# Patient Record
Sex: Female | Born: 1950
Health system: Southern US, Community
[De-identification: ages and names within clinical notes are randomized; demographics above are authoritative.]

## PROBLEM LIST (undated history)

## (undated) ENCOUNTER — Emergency Department (HOSPITAL_COMMUNITY): Admission: EM | Payer: Medicare Other | Source: Home / Self Care

## (undated) DIAGNOSIS — F419 Anxiety disorder, unspecified: Secondary | ICD-10-CM

## (undated) DIAGNOSIS — M1990 Unspecified osteoarthritis, unspecified site: Secondary | ICD-10-CM

## (undated) DIAGNOSIS — I69191 Dysphagia following nontraumatic intracerebral hemorrhage: Secondary | ICD-10-CM

## (undated) DIAGNOSIS — I639 Cerebral infarction, unspecified: Secondary | ICD-10-CM

## (undated) DIAGNOSIS — R4701 Aphasia: Secondary | ICD-10-CM

## (undated) DIAGNOSIS — E785 Hyperlipidemia, unspecified: Secondary | ICD-10-CM

## (undated) DIAGNOSIS — M6281 Muscle weakness (generalized): Secondary | ICD-10-CM

## (undated) HISTORY — DX: Unspecified osteoarthritis, unspecified site: M19.90

## (undated) HISTORY — PX: CHOLECYSTECTOMY: SHX55

## (undated) HISTORY — DX: Cerebral infarction, unspecified: I63.9

## (undated) HISTORY — DX: Hyperlipidemia, unspecified: E78.5

## (undated) HISTORY — PX: TONSILLECTOMY: SUR1361

---

## 2012-01-26 ENCOUNTER — Ambulatory Visit: Payer: Self-pay | Admitting: Family Medicine

## 2012-02-15 ENCOUNTER — Ambulatory Visit (INDEPENDENT_AMBULATORY_CARE_PROVIDER_SITE_OTHER): Payer: Medicare Other | Admitting: Family Medicine

## 2012-02-15 ENCOUNTER — Encounter: Payer: Self-pay | Admitting: Family Medicine

## 2012-02-15 VITALS — BP 130/70 | HR 92 | Temp 98.1°F | Ht 62.5 in | Wt 153.6 lb

## 2012-02-15 DIAGNOSIS — Z0000 Encounter for general adult medical examination without abnormal findings: Secondary | ICD-10-CM

## 2012-02-15 DIAGNOSIS — E785 Hyperlipidemia, unspecified: Secondary | ICD-10-CM | POA: Insufficient documentation

## 2012-02-15 DIAGNOSIS — E119 Type 2 diabetes mellitus without complications: Secondary | ICD-10-CM

## 2012-02-15 DIAGNOSIS — Z1231 Encounter for screening mammogram for malignant neoplasm of breast: Secondary | ICD-10-CM

## 2012-02-15 DIAGNOSIS — Z780 Asymptomatic menopausal state: Secondary | ICD-10-CM

## 2012-02-15 DIAGNOSIS — E1149 Type 2 diabetes mellitus with other diabetic neurological complication: Secondary | ICD-10-CM | POA: Insufficient documentation

## 2012-02-15 DIAGNOSIS — R319 Hematuria, unspecified: Secondary | ICD-10-CM

## 2012-02-15 LAB — POCT URINALYSIS DIPSTICK
Ketones, UA: NEGATIVE
Spec Grav, UA: >=1.03
pH, UA: 6

## 2012-02-15 MED ORDER — METFORMIN HCL 500 MG PO TABS: 1000.0000 mg | ORAL_TABLET | Freq: Every day | ORAL | Status: DC

## 2012-02-15 MED ORDER — GLIPIZIDE 10 MG PO TABS: 10.0000 mg | ORAL_TABLET | Freq: Two times a day (BID) | ORAL | Status: DC

## 2012-02-15 MED ORDER — ATORVASTATIN CALCIUM 20 MG PO TABS: 20.0000 mg | ORAL_TABLET | Freq: Every day | ORAL | Status: DC

## 2012-02-15 NOTE — Progress Notes (Signed)
Addended by: Silvio Pate D on: 02/15/2012 04:35 PM   Modules accepted: Orders

## 2012-02-15 NOTE — Patient Instructions (Signed)

## 2012-02-15 NOTE — Progress Notes (Signed)
  Subjective:     Sheila [name] is a 61 y.o. female who presents for follow up of diabetes.. Current symptoms include: none. Patient denies foot ulcerations, hyperglycemia, hypoglycemia , increased appetite, nausea, paresthesia of the feet, polydipsia, polyuria, visual disturbances, vomiting and weight loss. Evaluation to date has been: fasting blood sugar, fasting lipid panel, hemoglobin A1C and microalbuminuria. Home sugars: BGs consistently in an acceptable range. Current treatments: more intensive attention to diet which has been effective, Continued sulfonylurea which has been effective, Continued metformin which has been effective and Continued statin which has been effective. Last dilated eye exam > 1 year.  The following portions of the patient's history were reviewed and updated as appropriate: allergies, current medications, past family history, past medical history, past social history, past surgical history and problem list.  Review of Systems Review of Systems  Constitutional: Negative for activity change, appetite change and fatigue.  HENT: Negative for hearing loss, congestion, tinnitus and ear discharge.   Eyes: Negative for visual disturbance (see optho q1y -- vision corrected to 20/20 with glasses-- ov due).  Respiratory: Negative for cough, chest tightness and shortness of breath.   Cardiovascular: Negative for chest pain, palpitations and leg swelling.  Gastrointestinal: Negative for abdominal pain, diarrhea, constipation and abdominal distention.  Genitourinary: Negative for urgency, frequency, decreased urine volume and difficulty urinating.  Musculoskeletal: Negative for back pain, arthralgias ---  + left sided drop ffoot and weakness in arm secondary to stroke Skin: Negative for color change, pallor and rash.  Neurological: Negative for dizziness, light-headedness, numbness and headaches.  Hematological: Negative for adenopathy. Does not bruise/bleed easily.    Psychiatric/Behavioral: Negative for suicidal ideas, confusion, sleep disturbance, self-injury, dysphoric mood, decreased concentration and agitation.       Objective:    BP 130/70  Pulse 92  Temp(Src) 98.1 F (36.7 C) (Oral)  Ht 5' 2.5" (1.588 m)  Wt 153 lb 9.6 oz (69.673 kg)  BMI 27.65 kg/m2  SpO2 97% General appearance: alert, cooperative, appears stated age and no distress Lungs: clear to auscultation bilaterally Heart: regular rate and rhythm, S1, S2 normal, no murmur, click, rub or gallop Extremities: extremities normal, atraumatic, no cyanosis or edema   Sensory exam of the foot is normal, tested with the monofilament. Good pulses, no lesions or ulcers, good peripheral pulses.   Patient was evaluated for proper footwear and sizing.  Laboratory: No components found with this basename: A1C      Assessment:    Diabetes mellitus Type II, under - control.   hyperlipidemia--- con't lipitor Plan:    Discussed general issues about diabetes pathophysiology and management. Agricultural engineer distributed. Discussed foot care. Reminded to get yearly retinal exam. Continued sulfonylurea; see medication orders.  Continued metformin; see  medication orders. Continued statin drug see medication orders. Labs: fasting blood sugar, fasting lipid panel, hemoglobin A1C and microalbuminuria. Follow up in 3 months or as needed.

## 2012-02-16 LAB — CBC WITH DIFFERENTIAL/PLATELET
Basophils Absolute: 0 10*3/uL (ref 0.0–0.1)
Eosinophils Relative: 3.4 % (ref 0.0–5.0)
Hemoglobin: 13.4 g/dL (ref 12.0–15.0)
Lymphocytes Relative: 39.8 % (ref 12.0–46.0)
Monocytes Relative: 12.6 % — ABNORMAL HIGH (ref 3.0–12.0)
Neutro Abs: 2.8 10*3/uL (ref 1.4–7.7)
Platelets: 277 10*3/uL (ref 150.0–400.0)
RDW: 13.4 % (ref 11.5–14.6)
WBC: 6.4 10*3/uL (ref 4.5–10.5)

## 2012-02-16 LAB — LIPID PANEL
HDL: 62 mg/dL (ref >39.00)
LDL Cholesterol: 64 mg/dL (ref 0–99)
Total CHOL/HDL Ratio: 2
VLDL: 28.2 mg/dL (ref 0.0–40.0)

## 2012-02-16 LAB — HEPATIC FUNCTION PANEL
AST: 22 U/L (ref 0–37)
Alkaline Phosphatase: 78 U/L (ref 39–117)
Bilirubin, Direct: 0.1 mg/dL (ref 0.0–0.3)

## 2012-02-16 LAB — BASIC METABOLIC PANEL
BUN: 14 mg/dL (ref 6–23)
Calcium: 9.3 mg/dL (ref 8.4–10.5)
GFR: 65.78 mL/min (ref >60.00)
Glucose, Bld: 85 mg/dL (ref 70–99)
Potassium: 3.6 mEq/L (ref 3.5–5.1)
Sodium: 140 mEq/L (ref 135–145)

## 2012-02-16 LAB — HEMOGLOBIN A1C: Hgb A1c MFr Bld: 5.3 % (ref 4.6–6.5)

## 2012-02-16 LAB — MICROALBUMIN / CREATININE URINE RATIO: Microalb, Ur: 1.7 mg/dL (ref 0.0–1.9)

## 2012-02-18 LAB — URINE CULTURE: Colony Count: 30000

## 2012-02-21 ENCOUNTER — Telehealth: Payer: Self-pay

## 2012-02-21 NOTE — Telephone Encounter (Signed)
Patient aware to take Rx once a day if this is how it was previously prescribed and she voiced understanding and agreed. Changed on the med list     KP

## 2012-02-21 NOTE — Telephone Encounter (Signed)
Call from patient and she she stated she normally takes the Glipizide once a day and the Rx was sent for twice a day. Please advise     KP

## 2012-02-21 NOTE — Telephone Encounter (Signed)
If taking it once a day that is ok

## 2012-03-08 ENCOUNTER — Ambulatory Visit
Admission: RE | Admit: 2012-03-08 | Discharge: 2012-03-08 | Disposition: A | Discharge status: Home / Self Care | Payer: Medicare Other | Source: Ambulatory Visit | Attending: Family Medicine | Admitting: Family Medicine

## 2012-03-08 DIAGNOSIS — Z1231 Encounter for screening mammogram for malignant neoplasm of breast: Secondary | ICD-10-CM

## 2012-03-08 DIAGNOSIS — Z780 Asymptomatic menopausal state: Secondary | ICD-10-CM

## 2012-03-09 ENCOUNTER — Encounter: Payer: Self-pay | Admitting: Family Medicine

## 2013-02-09 ENCOUNTER — Other Ambulatory Visit: Payer: Self-pay | Admitting: Family Medicine

## 2013-02-28 ENCOUNTER — Telehealth: Payer: Self-pay | Admitting: Family Medicine

## 2013-02-28 NOTE — Telephone Encounter (Signed)
To MD for review     KP 

## 2013-02-28 NOTE — Telephone Encounter (Signed)
Patient Information:  Caller Name: Seanne  Phone: [phone]  Patient: Sheila [name]  Gender: Female  DOB: 1951-07-25  Age: 62 Years  PCP: Lelon Perla.  Office Follow Up:  Does the office need to follow up with this patient?: No  Instructions For The Office: N/A  RN Note:  Pt fell down 2 to 3 sets of stairs, Pt landed on Carpet on Right leg. Pt unable to put weight on Right Leg. Right Leg is shorter than Left Leg w/ current hip pain.  Advised Pt to be seen at ED or Urgent care for xrays due to severe pain, walks w/ limp and Right Leg is shorter than Left Leg.  Symptoms  Reason For Call & Symptoms: ER CALL.  Fall, Right Leg Pain, unable to put weight on it  Reviewed Health History In EMR: N/A  Reviewed Medications In EMR: N/A  Reviewed Allergies In EMR: N/A  Reviewed Surgeries / Procedures: N/A  Date of Onset of Symptoms: 02/27/2013  Guideline(s) Used:  Leg Injury  Hip Injury  Disposition Per Guideline:   Go to ED Now (or to Office with PCP Approval)  Reason For Disposition Reached:   Severe pain (e.g., excruciating)  Advice Given:  N/A

## 2013-02-28 NOTE — Telephone Encounter (Signed)
Apt scheduled in the office for tomorrow per patient request.    KP

## 2013-02-28 NOTE — Telephone Encounter (Signed)
Sounds like she needs xrays--- to ER

## 2013-03-01 ENCOUNTER — Encounter: Payer: Self-pay | Admitting: Family Medicine

## 2013-03-01 ENCOUNTER — Ambulatory Visit (INDEPENDENT_AMBULATORY_CARE_PROVIDER_SITE_OTHER): Payer: Medicare Other | Admitting: Family Medicine

## 2013-03-01 ENCOUNTER — Ambulatory Visit (INDEPENDENT_AMBULATORY_CARE_PROVIDER_SITE_OTHER)
Admission: RE | Admit: 2013-03-01 | Discharge: 2013-03-01 | Disposition: A | Discharge status: Home / Self Care | Payer: Medicare Other | Source: Ambulatory Visit | Attending: Family Medicine | Admitting: Family Medicine

## 2013-03-01 VITALS — BP 152/86 | HR 98 | Temp 98.5°F | Wt 155.6 lb

## 2013-03-01 DIAGNOSIS — E785 Hyperlipidemia, unspecified: Secondary | ICD-10-CM

## 2013-03-01 DIAGNOSIS — M549 Dorsalgia, unspecified: Secondary | ICD-10-CM

## 2013-03-01 DIAGNOSIS — E119 Type 2 diabetes mellitus without complications: Secondary | ICD-10-CM

## 2013-03-01 LAB — LIPID PANEL
HDL: 49.6 mg/dL (ref >39.00)
Total CHOL/HDL Ratio: 3
VLDL: 18.6 mg/dL (ref 0.0–40.0)

## 2013-03-01 LAB — POCT URINALYSIS DIPSTICK
Ketones, UA: NEGATIVE
Leukocytes, UA: NEGATIVE
Nitrite, UA: NEGATIVE
Protein, UA: NEGATIVE
pH, UA: 6

## 2013-03-01 LAB — BASIC METABOLIC PANEL
BUN: 16 mg/dL (ref 6–23)
Chloride: 106 mEq/L (ref 96–112)
GFR: 65.56 mL/min (ref >60.00)
Glucose, Bld: 157 mg/dL — ABNORMAL HIGH (ref 70–99)
Potassium: 3.7 mEq/L (ref 3.5–5.1)
Sodium: 139 mEq/L (ref 135–145)

## 2013-03-01 LAB — MICROALBUMIN / CREATININE URINE RATIO: Microalb, Ur: 1.9 mg/dL (ref 0.0–1.9)

## 2013-03-01 LAB — HEPATIC FUNCTION PANEL
AST: 21 U/L (ref 0–37)
Albumin: 4 g/dL (ref 3.5–5.2)
Alkaline Phosphatase: 89 U/L (ref 39–117)
Bilirubin, Direct: 0 mg/dL (ref 0.0–0.3)
Total Protein: 7.1 g/dL (ref 6.0–8.3)

## 2013-03-01 LAB — HEMOGLOBIN A1C: Hgb A1c MFr Bld: 5.3 % (ref 4.6–6.5)

## 2013-03-01 MED ORDER — CYCLOBENZAPRINE HCL 10 MG PO TABS: 10.0000 mg | ORAL_TABLET | Freq: Three times a day (TID) | ORAL | Status: DC | PRN

## 2013-03-01 MED ORDER — TRAMADOL HCL 50 MG PO TABS: 50.0000 mg | ORAL_TABLET | Freq: Three times a day (TID) | ORAL | Status: DC | PRN

## 2013-03-01 NOTE — Patient Instructions (Addendum)
Back Pain, Adult Low back pain is very common. About 1 in 5 people have back pain.The cause of low back pain is rarely dangerous. The pain often gets better over time.About half of people with a sudden onset of back pain feel better in just 2 weeks. About 8 in 10 people feel better by 6 weeks.  CAUSES Some common causes of back pain include:  Strain of the muscles or ligaments supporting the spine.  Wear and tear (degeneration) of the spinal discs.  Arthritis.  Direct injury to the back. DIAGNOSIS Most of the time, the direct cause of low back pain is not known.However, back pain can be treated effectively even when the exact cause of the pain is unknown.Answering your caregiver's questions about your overall health and symptoms is one of the most accurate ways to make sure the cause of your pain is not dangerous. If your caregiver needs more information, he or she may order lab work or imaging tests (X-rays or MRIs).However, even if imaging tests show changes in your back, this usually does not require surgery. HOME CARE INSTRUCTIONS For many people, back pain returns.Since low back pain is rarely dangerous, it is often a condition that people can learn to manageon their own.   Remain active. It is stressful on the back to sit or stand in one place. Do not sit, drive, or stand in one place for more than 30 minutes at a time. Take short walks on level surfaces as soon as pain allows.Try to increase the length of time you walk each day.  Do not stay in bed.Resting more than 1 or 2 days can delay your recovery.  Do not avoid exercise or work.Your body is made to move.It is not dangerous to be active, even though your back may hurt.Your back will likely heal faster if you return to being active before your pain is gone.  Pay attention to your body when you bend and lift. Many people have less discomfortwhen lifting if they bend their knees, keep the load close to their bodies,and  avoid twisting. Often, the most comfortable positions are those that put less stress on your recovering back.  Find a comfortable position to sleep. Use a firm mattress and lie on your side with your knees slightly bent. If you lie on your back, put a pillow under your knees.  Only take over-the-counter or prescription medicines as directed by your caregiver. Over-the-counter medicines to reduce pain and inflammation are often the most helpful.Your caregiver may prescribe muscle relaxant drugs.These medicines help dull your pain so you can more quickly return to your normal activities and healthy exercise.  Put ice on the injured area.  Put ice in a plastic bag.  Place a towel between your skin and the bag.  Leave the ice on for 15 to 20 minutes, 3 to 4 times a day for the first 2 to 3 days. After that, ice and heat may be alternated to reduce pain and spasms.  Ask your caregiver about trying back exercises and gentle massage. This may be of some benefit.  Avoid feeling anxious or stressed.Stress increases muscle tension and can worsen back pain.It is important to recognize when you are anxious or stressed and learn ways to manage it.Exercise is a great option. SEEK MEDICAL CARE IF:  You have pain that is not relieved with rest or medicine.  You have pain that does not improve in 1 week.  You have new symptoms.  You are generally   not feeling well. SEEK IMMEDIATE MEDICAL CARE IF:   You have pain that radiates from your back into your legs.  You develop new bowel or bladder control problems.  You have unusual weakness or numbness in your arms or legs.  You develop nausea or vomiting.  You develop abdominal pain.  You feel faint. Document Released: 12/13/2005 Document Revised: 06/13/2012 Document Reviewed: 05/03/2011 Exit[employer] Patient Information 2013 [employer].  

## 2013-03-01 NOTE — Assessment & Plan Note (Signed)
Ultram Flexeril Xray MRI See AVS

## 2013-03-01 NOTE — Progress Notes (Signed)
  Subjective:    Patient ID: Sheila [name], female    DOB: September 13, 1951, 62 y.o.   MRN: [id number]  HPI Pt here c/o fall 2 days ago.  She was coming down the steps at home and her right leg gave out on her and she slid down the steps with her leg bent underneath her.  Now pain in low back and R hip   Review of Systems As above Objective:   Physical Exam  BP 152/86  Pulse 98  Temp(Src) 98.5 F (36.9 C) (Oral)  Wt 155 lb 9.6 oz (70.58 kg)  BMI 27.99 kg/m2  SpO2 97% General appearance: alert, cooperative, appears stated age and mild distress Extremities: no edema  no errythema Neurologic: Alert and oriented X 3, normal strength and tone. Normal symmetric reflexes. Normal coordination and gait Motor: strength 3/5 R hip flexion and dorsiflexion R foot Reflexes: dec reflexes R patella Gait: Antalgic      Assessment & Plan:

## 2013-03-06 ENCOUNTER — Telehealth: Payer: Self-pay

## 2013-03-06 NOTE — Telephone Encounter (Signed)
Spoke with patient and she voiced understanding. Ortho referral put in. She would like for you to cancel the MRI because she does not have 400 dollars to pay upfront.      KP

## 2013-03-06 NOTE — Telephone Encounter (Signed)
Message copied by Arnette Norris on Tue Mar 06, 2013  5:38 PM ------      Message from: Lelon Perla      Created: Thu Mar 01, 2013  2:12 PM       Mild slip L4-5--refer to ortho ------

## 2013-03-07 NOTE — Telephone Encounter (Signed)
MRI has been cancelled for patient, and she has been referred to an Orthopaedic Specialist.

## 2013-03-07 NOTE — Telephone Encounter (Addendum)
Good morning Sheila [name],   Since MRI has been cancelled for this patient, radiology is asking that we remove the order now. Will you please remove? Thank you   BH     MRI cancelled.    KP//CMA

## 2013-03-08 ENCOUNTER — Ambulatory Visit (HOSPITAL_COMMUNITY): Admission: RE | Admit: 2013-03-08 | Payer: Medicare Other | Source: Ambulatory Visit

## 2013-04-26 ENCOUNTER — Other Ambulatory Visit: Payer: Self-pay | Admitting: Family Medicine

## 2013-04-26 NOTE — Telephone Encounter (Signed)
Last seen and filled 03/01/13 #30. Please advise     KP 

## 2013-04-28 ENCOUNTER — Other Ambulatory Visit: Payer: Self-pay | Admitting: Family Medicine

## 2013-04-30 NOTE — Telephone Encounter (Signed)
Last seen and filled 03/01/13 #30. Please advise     KP

## 2013-05-12 ENCOUNTER — Other Ambulatory Visit: Payer: Self-pay | Admitting: Family Medicine

## 2013-06-07 ENCOUNTER — Encounter: Payer: Self-pay | Admitting: Family Medicine

## 2013-06-07 ENCOUNTER — Ambulatory Visit (INDEPENDENT_AMBULATORY_CARE_PROVIDER_SITE_OTHER): Payer: Medicare Other | Admitting: Family Medicine

## 2013-06-07 ENCOUNTER — Encounter: Payer: Self-pay | Admitting: Lab

## 2013-06-07 VITALS — BP 122/70 | HR 73 | Temp 98.7°F | Wt 150.2 lb

## 2013-06-07 DIAGNOSIS — M549 Dorsalgia, unspecified: Secondary | ICD-10-CM

## 2013-06-07 NOTE — Progress Notes (Signed)
  Subjective:    Patient ID: Sheila [name], female    DOB: 02-12-1951, 61 y.o.   MRN: [id number]  HPI Pt here c/o numbness down left leg.  Pt fell several months ago--see ov and pt saw ortho.  She went to PT a few times but due to cost the pt sent her home with home exercises and she stopped PT.  Pt has had no new injury but over the last 3 weeks she has had worsening numbness in leg.  Pt has cyclobenzaprine , and mobic which does help.      Review of Systems As above     Objective:   Physical Exam BP 122/70  Pulse 73  Temp(Src) 98.7 F (37.1 C) (Oral)  Wt 150 lb 3.2 oz (68.13 kg)  BMI 27.02 kg/m2  SpO2 97% General appearance: alert, cooperative, appears stated age and no distress Extremities: extremities normal, atraumatic, no cyanosis or edema                  Good strength low ext , dtr = and b/l        Assessment & Plan:

## 2013-06-07 NOTE — Patient Instructions (Signed)
Back Exercises Back exercises help treat and prevent back injuries. The goal of back exercises is to increase the strength of your abdominal and back muscles and the flexibility of your back. These exercises should be started when you no longer have back pain. Back exercises include:  Pelvic Tilt. Lie on your back with your knees bent. Tilt your pelvis until the lower part of your back is against the floor. Hold this position 5 to 10 sec and repeat 5 to 10 times.  Knee to Chest. Pull first 1 knee up against your chest and hold for 20 to 30 seconds, repeat this with the other knee, and then both knees. This may be done with the other leg straight or bent, whichever feels better.  Sit-Ups or Curl-Ups. Bend your knees 90 degrees. Start with tilting your pelvis, and do a partial, slow sit-up, lifting your trunk only 30 to 45 degrees off the floor. Take at least 2 to 3 seconds for each sit-up. Do not do sit-ups with your knees out straight. If partial sit-ups are difficult, simply do the above but with only tightening your abdominal muscles and holding it as directed.  Hip-Lift. Lie on your back with your knees flexed 90 degrees. Push down with your feet and shoulders as you raise your hips a couple inches off the floor; hold for 10 seconds, repeat 5 to 10 times.  Back arches. Lie on your stomach, propping yourself up on bent elbows. Slowly press on your hands, causing an arch in your low back. Repeat 3 to 5 times. Any initial stiffness and discomfort should lessen with repetition over time.  Shoulder-Lifts. Lie face down with arms beside your body. Keep hips and torso pressed to floor as you slowly lift your head and shoulders off the floor. Do not overdo your exercises, especially in the beginning. Exercises may cause you some mild back discomfort which lasts for a few minutes; however, if the pain is more severe, or lasts for more than 15 minutes, do not continue exercises until you see your caregiver.  Improvement with exercise therapy for back problems is slow.  See your caregivers for assistance with developing a proper back exercise program. Document Released: 01/20/2005 Document Revised: 03/06/2012 Document Reviewed: 10/14/2011 [employer] Patient Information 2014 [employer].  

## 2013-06-07 NOTE — Assessment & Plan Note (Signed)
Encouraged pt to do her PT exercises Use the mobic and flexeril Heat prn F/u ortho if no better Ortho note from March reviewed

## 2013-11-02 ENCOUNTER — Other Ambulatory Visit: Payer: Self-pay | Admitting: Family Medicine

## 2013-11-06 ENCOUNTER — Other Ambulatory Visit: Payer: Self-pay | Admitting: Family Medicine

## 2013-11-06 NOTE — Telephone Encounter (Signed)
Metformin and Glipizide refilled

## 2013-12-27 DIAGNOSIS — I639 Cerebral infarction, unspecified: Secondary | ICD-10-CM

## 2013-12-27 HISTORY — DX: Cerebral infarction, unspecified: I63.9

## 2014-01-17 ENCOUNTER — Telehealth: Payer: Self-pay | Admitting: Family Medicine

## 2014-01-17 NOTE — Telephone Encounter (Signed)
Pharmacy called requesting a verbal for pts diabetic supplies. CB# [phone]

## 2014-01-17 NOTE — Telephone Encounter (Signed)
Spoke with patient and she stated she has a [facility]. She is already gets her supplies from [facility]. No need to get them from anywhere else because she also has more than enough.    KP

## 2014-01-22 ENCOUNTER — Encounter: Payer: Self-pay | Admitting: Lab

## 2014-01-22 ENCOUNTER — Ambulatory Visit (INDEPENDENT_AMBULATORY_CARE_PROVIDER_SITE_OTHER): Payer: Medicare Other | Admitting: Family Medicine

## 2014-01-22 ENCOUNTER — Encounter: Payer: Self-pay | Admitting: Family Medicine

## 2014-01-22 VITALS — BP 130/76 | HR 84 | Temp 98.1°F | Wt 148.2 lb

## 2014-01-22 DIAGNOSIS — E785 Hyperlipidemia, unspecified: Secondary | ICD-10-CM

## 2014-01-22 DIAGNOSIS — F411 Generalized anxiety disorder: Secondary | ICD-10-CM

## 2014-01-22 DIAGNOSIS — E119 Type 2 diabetes mellitus without complications: Secondary | ICD-10-CM

## 2014-01-22 DIAGNOSIS — I10 Essential (primary) hypertension: Secondary | ICD-10-CM

## 2014-01-22 LAB — POCT URINALYSIS DIPSTICK
Bilirubin, UA: NEGATIVE
Blood, UA: NEGATIVE
GLUCOSE UA: NEGATIVE
KETONES UA: NEGATIVE
Leukocytes, UA: NEGATIVE
Nitrite, UA: NEGATIVE
Protein, UA: NEGATIVE
SPEC GRAV UA: 1.025
Urobilinogen, UA: 0.2
pH, UA: 6.5

## 2014-01-22 LAB — LIPID PANEL
CHOLESTEROL: 145 mg/dL (ref 0–200)
HDL: 56.2 mg/dL (ref >39.00)
LDL Cholesterol: 71 mg/dL (ref 0–99)
Total CHOL/HDL Ratio: 3
Triglycerides: 91 mg/dL (ref 0.0–149.0)
VLDL: 18.2 mg/dL (ref 0.0–40.0)

## 2014-01-22 LAB — MICROALBUMIN / CREATININE URINE RATIO
Creatinine,U: 208.6 mg/dL
Microalb Creat Ratio: 0.4 mg/g (ref 0.0–30.0)
Microalb, Ur: 0.9 mg/dL (ref 0.0–1.9)

## 2014-01-22 LAB — BASIC METABOLIC PANEL
BUN: 11 mg/dL (ref 6–23)
CO2: 25 mEq/L (ref 19–32)
CREATININE: 0.9 mg/dL (ref 0.4–1.2)
Calcium: 9.2 mg/dL (ref 8.4–10.5)
Chloride: 108 mEq/L (ref 96–112)
GFR: 64.56 mL/min (ref >60.00)
Glucose, Bld: 111 mg/dL — ABNORMAL HIGH (ref 70–99)
POTASSIUM: 3.8 meq/L (ref 3.5–5.1)
Sodium: 140 mEq/L (ref 135–145)

## 2014-01-22 LAB — HEPATIC FUNCTION PANEL
ALBUMIN: 4 g/dL (ref 3.5–5.2)
ALK PHOS: 90 U/L (ref 39–117)
ALT: 16 U/L (ref 0–35)
AST: 20 U/L (ref 0–37)
Bilirubin, Direct: 0 mg/dL (ref 0.0–0.3)
Total Bilirubin: 0.8 mg/dL (ref 0.3–1.2)
Total Protein: 7 g/dL (ref 6.0–8.3)

## 2014-01-22 LAB — HEMOGLOBIN A1C: HEMOGLOBIN A1C: 5.3 % (ref 4.6–6.5)

## 2014-01-22 MED ORDER — SERTRALINE HCL 50 MG PO TABS: 50.0000 mg | ORAL_TABLET | Freq: Every day | ORAL | Status: DC

## 2014-01-22 MED ORDER — CYCLOBENZAPRINE HCL 10 MG PO TABS: ORAL_TABLET | ORAL | Status: DC

## 2014-01-22 MED ORDER — LORAZEPAM 0.5 MG PO TABS: 0.5000 mg | ORAL_TABLET | Freq: Two times a day (BID) | ORAL | Status: DC | PRN

## 2014-01-22 NOTE — Progress Notes (Signed)
Pre visit review using our clinic review tool, if applicable. No additional management support is needed unless otherwise documented below in the visit note. 

## 2014-01-22 NOTE — Progress Notes (Signed)
Subjective:     Sheila [name] is a 63 y.o. female who presents for new evaluation and treatment of anxiety disorder and panic attacks. She has the following anxiety symptoms: difficulty concentrating, feelings of losing control and panic attacks. Onset of symptoms was approximately several months ago. Symptoms have been gradually worsening since that time. She denies current suicidal and homicidal ideation. Family history significant for no psychiatric illness. Risk factors: recently moved out on her own. Previous treatment includes Paxil. She complains of the following medication side effects: nightmares.    DIABETES  Blood Sugar ranges-56-200  Polyuria- no New Visual problems- no Hypoglycemic symptoms- no Other side effects-no Medication compliance - good Last eye exam- due Foot exam- today  HYPERLIPIDEMIA  Medication compliance- good RUQ pain- no  Muscle aches- no Other side effects-no  ROS See HPI above   PMH Smoking Status noted      The following portions of the patient's history were reviewed and updated as appropriate:  She  has a past medical history of Arthritis; Diabetes mellitus; Stroke; Seizures; and Hyperlipidemia. She  does not have any pertinent problems on file. She  has past surgical history that includes Cholecystectomy and Tonsillectomy. Her family history includes Diabetes in her brother and mother. She  reports that she quit smoking about 21 years ago. Her smoking use included Cigarettes. She smoked 0.00 packs per day. She has never used smokeless tobacco. She reports that she does not drink alcohol or use illicit drugs. She has a current medication list which includes the following prescription(s): aspirin, atorvastatin, vitamin d3, cyclobenzaprine, fish oil-omega-3 fatty acids, glipizide, metformin, tramadol, ibuprofen, magnesium, meloxicam, and multivitamin. Current Outpatient Prescriptions on File Prior to Visit  Medication Sig Dispense Refill  .  aspirin 81 MG tablet Take 81 mg by mouth daily.      Marland Kitchen atorvastatin (LIPITOR) 20 MG tablet 1 tab by mouth daily--Labs are due now  90 tablet  0  . Cholecalciferol (VITAMIN D3) 1000 UNITS CAPS Take 1 capsule by mouth daily.      . cyclobenzaprine (FLEXERIL) 10 MG tablet TAKE 1 TABLET BY MOUTH THREE TIMES DAILY AS NEEDED FOR MUSCLE SPASMS  30 tablet  0  . fish oil-omega-3 fatty acids 1000 MG capsule Take 1 g by mouth daily.      Marland Kitchen glipiZIDE (GLUCOTROL) 10 MG tablet TAKE 1 TABLET BY MOUTH TWICE DAILY BEFORE A MEAL  180 tablet  0  . metFORMIN (GLUCOPHAGE) 500 MG tablet TAKE 2 TABLETS ONCE A DAY  180 tablet  0  . traMADol (ULTRAM) 50 MG tablet TAKE 1 TABLET BY MOUTH EVERY 8 HOURS AS NEEDED FOR PAIN  30 tablet  0  . ibuprofen (ADVIL,MOTRIN) 800 MG tablet Take 800 mg by mouth every 8 (eight) hours as needed.      . magnesium 30 MG tablet Take 30 mg by mouth daily.      . meloxicam (MOBIC) 15 MG tablet Take 15 mg by mouth daily. Dr.Dumonski      . Multiple Vitamin (MULTIVITAMIN) tablet Take 1 tablet by mouth daily.       No current facility-administered medications on file prior to visit.   She is allergic to sulfa antibiotics..  Review of Systems Pertinent items are noted in HPI.    Objective:    BP 130/76  Pulse 84  Temp(Src) 98.1 F (36.7 C) (Oral)  Wt 148 lb 3.2 oz (67.223 kg)  SpO2 98% General appearance: alert, cooperative, appears stated age and no  distress Throat: lips, mucosa, and tongue normal; teeth and gums normal Neck: no adenopathy, no carotid bruit, no JVD, supple, symmetrical, trachea midline and thyroid not enlarged, symmetric, no tenderness/mass/nodules Lungs: clear to auscultation bilaterally Heart: S1, S2 normal Extremities: extremities normal, atraumatic, no cyanosis or edema Psych-- fast speech,  + anxiety with panic attacks      Assessment:    anxiety disorder and panic attacks. Possible organic contributing causes are: none.   Plan:    Medications: Ativan and  Zoloft. Labs: see orders. Handouts describing disease, natural history, and treatment were given to the patient. Instructed patient to contact office or on-call physician promptly should condition worsen or any new symptoms appear and provided on-call telephone numbers. IF THE PATIENT HAS ANY SUICIDAL OR HOMICIDAL IDEATIONS, CALL THE OFFICE, DISCUSS WITH A SUPPORT MEMBER, OR GO TO THE ER IMMEDIATELY. Patient was agreeable with this plan. Follow up: 3 months. Spent 25 minutes (>50% of visit) discussing the risks of anxiety disorder and panic attacks, the  pathophysiology, etiology, risks, and principles of treatment.

## 2014-01-22 NOTE — Patient Instructions (Signed)
Generalized Anxiety Disorder Generalized anxiety disorder (GAD) is a mental disorder. It interferes with life functions, including relationships, work, and school. GAD is different from normal anxiety, which everyone experiences at some point in their lives in response to specific life events and activities. Normal anxiety actually helps [place] prepare for and get through these life events and activities. Normal anxiety goes away after the event or activity is over.  GAD causes anxiety that is not necessarily related to specific events or activities. It also causes excess anxiety in proportion to specific events or activities. The anxiety associated with GAD is also difficult to control. GAD can vary from mild to severe. People with severe GAD can have intense waves of anxiety with physical symptoms (panic attacks).  SYMPTOMS The anxiety and worry associated with GAD are difficult to control. This anxiety and worry are related to many life events and activities and also occur more days than not for 6 months or longer. People with GAD also have three or more of the following symptoms (one or more in children):  Restlessness.   Fatigue.  Difficulty concentrating.   Irritability.  Muscle tension.  Difficulty sleeping or unsatisfying sleep. DIAGNOSIS GAD is diagnosed through an assessment by your caregiver. Your caregiver will ask you questions aboutyour mood,physical symptoms, and events in your life. Your caregiver may ask you about your medical history and use of alcohol or drugs, including prescription medications. Your caregiver may also do a physical exam and blood tests. Certain medical conditions and the use of certain substances can cause symptoms similar to those associated with GAD. Your caregiver may refer you to a mental health specialist for further evaluation. TREATMENT The following therapies are usually used to treat GAD:   Medication Antidepressant medication usually is  prescribed for long-term daily control. Antianxiety medications may be added in severe cases, especially when panic attacks occur.   Talk therapy (psychotherapy) Certain types of talk therapy can be helpful in treating GAD by providing support, education, and guidance. A form of talk therapy called cognitive behavioral therapy can teach you healthy ways to think about and react to daily life events and activities.  Stress managementtechniques These include yoga, meditation, and exercise and can be very helpful when they are practiced regularly. A mental health specialist can help determine which treatment is best for you. Some people see improvement with one therapy. However, other people require a combination of therapies. Document Released: 04/09/2013 Document Reviewed: 04/09/2013 [facility] Patient Information 2014 [place], [place].  Diabetes and Standards of Medical Care  Diabetes is complicated. You may find that your diabetes team includes a dietitian, nurse, diabetes educator, eye doctor, and more. To help everyone know what is going on and to help you get the care you deserve, the following schedule of care was developed to help keep you on track. Below are the tests, exams, vaccines, medicines, education, and plans you will need. HbA1c test This test shows how well you have controlled your glucose over the past 2 3 months. It is used to see if your diabetes management plan needs to be adjusted.   It is performed at least 2 times a year if you are meeting treatment goals.  It is performed 4 times a year if therapy has changed or if you are not meeting treatment goals. Blood pressure test  This test is performed at every routine medical visit. The goal is less than 140/90 mmHg for most people, but 130/80 mmHg in some cases. Ask your health  care provider about your goal. Dental exam  Follow up with the dentist regularly. Eye exam  If you are diagnosed with type 1 diabetes as a child,  get an exam upon reaching the age of 14 years or older and have had diabetes for 3 5 years. Yearly eye exams are recommended after that initial eye exam.  If you are diagnosed with type 1 diabetes as an adult, get an exam within 5 years of diagnosis and then yearly.  If you are diagnosed with type 2 diabetes, get an exam as soon as possible after the diagnosis and then yearly. Foot care exam  Visual foot exams are performed at every routine medical visit. The exams check for cuts, injuries, or other problems with the feet.  A comprehensive foot exam should be done yearly. This includes visual inspection as well as assessing foot pulses and testing for loss of sensation.  Check your feet nightly for cuts, injuries, or other problems with your feet. Tell your health care provider if anything is not healing. Kidney function test (urine microalbumin)  This test is performed once a year.  Type 1 diabetes: The first test is performed 5 years after diagnosis.  Type 2 diabetes: The first test is performed at the time of diagnosis.  A serum creatinine and estimated glomerular filtration rate (eGFR) test is done once a year to assess the level of chronic kidney disease (CKD), if present. Lipid profile (cholesterol, HDL, LDL, triglycerides)  Performed every 5 years for most people.  The goal for LDL is less than 100 mg/dL. If you are at high risk, the goal is less than 70 mg/dL.  The goal for HDL is 40 mg/dL 50 mg/dL for men and 50 mg/dL 60 mg/dL for women. An HDL cholesterol of 60 mg/dL or higher gives some protection against heart disease.  The goal for triglycerides is less than 150 mg/dL. Influenza vaccine, pneumococcal vaccine, and hepatitis B vaccine  The influenza vaccine is recommended yearly.  The pneumococcal vaccine is generally given once in a lifetime. However, there are some instances when another vaccination is recommended. Check with your health care provider.  The hepatitis  B vaccine is also recommended for adults with diabetes. Diabetes self-management education  Education is recommended at diagnosis and ongoing as needed. Treatment plan  Your treatment plan is reviewed at every medical visit. Document Released: 10/10/2009 Document Revised: 08/15/2013 Document Reviewed: 05/15/2013 [facility] Patient Information 2014 [place].

## 2014-01-23 ENCOUNTER — Telehealth: Payer: Self-pay | Admitting: Family Medicine

## 2014-01-23 NOTE — Telephone Encounter (Signed)
Relevant patient education assigned to patient using Emmi. ° °

## 2014-01-25 ENCOUNTER — Telehealth: Payer: Self-pay

## 2014-01-25 NOTE — Telephone Encounter (Signed)
Relevant patient education assigned to patient using Emmi. ° °

## 2014-01-26 NOTE — Assessment & Plan Note (Signed)
Check labs con't meds 

## 2014-01-29 ENCOUNTER — Telehealth: Payer: Self-pay | Admitting: *Deleted

## 2014-01-29 NOTE — Telephone Encounter (Signed)
UDS collected on 01/23/14, screen was negative for all prescribed medications. Patient tested positive for THC (>100) and Tramadol (171). Per Dr. Nonda Lou written note, " If UDS continues to be positive we will no longer be able to give her prescriptions." Results labeled and sent to be scanned.

## 2014-02-02 ENCOUNTER — Other Ambulatory Visit: Payer: Self-pay | Admitting: Family Medicine

## 2014-02-05 ENCOUNTER — Ambulatory Visit (INDEPENDENT_AMBULATORY_CARE_PROVIDER_SITE_OTHER): Payer: Medicare Other | Admitting: Licensed Clinical Social Worker

## 2014-02-05 DIAGNOSIS — F321 Major depressive disorder, single episode, moderate: Secondary | ICD-10-CM

## 2014-02-19 ENCOUNTER — Ambulatory Visit: Payer: 59 | Admitting: Licensed Clinical Social Worker

## 2014-02-21 ENCOUNTER — Ambulatory Visit: Payer: 59 | Admitting: Licensed Clinical Social Worker

## 2014-02-22 ENCOUNTER — Other Ambulatory Visit: Payer: Self-pay | Admitting: Family Medicine

## 2014-03-01 ENCOUNTER — Encounter: Payer: Self-pay | Admitting: Family Medicine

## 2014-03-12 ENCOUNTER — Ambulatory Visit (INDEPENDENT_AMBULATORY_CARE_PROVIDER_SITE_OTHER): Payer: 59 | Admitting: Licensed Clinical Social Worker

## 2014-03-12 ENCOUNTER — Encounter: Payer: Self-pay | Admitting: Family Medicine

## 2014-03-12 ENCOUNTER — Ambulatory Visit (INDEPENDENT_AMBULATORY_CARE_PROVIDER_SITE_OTHER): Payer: Medicare Other | Admitting: Family Medicine

## 2014-03-12 VITALS — BP 136/82 | HR 84 | Temp 98.3°F | Wt 145.0 lb

## 2014-03-12 DIAGNOSIS — F321 Major depressive disorder, single episode, moderate: Secondary | ICD-10-CM

## 2014-03-12 DIAGNOSIS — IMO0002 Reserved for concepts with insufficient information to code with codable children: Secondary | ICD-10-CM

## 2014-03-12 DIAGNOSIS — E1165 Type 2 diabetes mellitus with hyperglycemia: Secondary | ICD-10-CM

## 2014-03-12 DIAGNOSIS — F411 Generalized anxiety disorder: Secondary | ICD-10-CM

## 2014-03-12 DIAGNOSIS — W19XXXA Unspecified fall, initial encounter: Secondary | ICD-10-CM

## 2014-03-12 DIAGNOSIS — E118 Type 2 diabetes mellitus with unspecified complications: Secondary | ICD-10-CM

## 2014-03-12 MED ORDER — GLIPIZIDE 10 MG PO TABS: 10.0000 mg | ORAL_TABLET | Freq: Every day | ORAL | Status: DC

## 2014-03-12 MED ORDER — SERTRALINE HCL 100 MG PO TABS: 100.0000 mg | ORAL_TABLET | Freq: Every day | ORAL | Status: DC

## 2014-03-12 NOTE — Progress Notes (Signed)
Patient ID: Sheila [name], female   DOB: [birth year], 63 y.o.   MRN: [id number]   Subjective:    Patient ID: Sheila [name], female    DOB: 1951-01-19, 63 y.o.   MRN: [id number] HPI Pt here f/u anxiety-- she is still getting anxious and cries a lot.  Pt is also c/o hypoglycemia.--- and getting very dizzy.            Objective:    BP 136/82  Pulse 84  Temp(Src) 98.3 F (36.8 C) (Oral)  Wt 145 lb (65.772 kg) General appearance: alert, cooperative, appears stated age and no distress Throat: lips, mucosa, and tongue normal; teeth and gums normal Neck: no adenopathy, no carotid bruit, no JVD, supple, symmetrical, trachea midline and thyroid not enlarged, symmetric, no tenderness/mass/nodules Lungs: clear to auscultation bilaterally Heart: S1, S2 normal Extremities: extremities normal, atraumatic, no cyanosis or edema        Assessment & Plan:  1. Anxiety state, unspecified Inc zoloft to 100 mg  Klonopin refill - sertraline (ZOLOFT) 100 MG tablet; Take 1 tablet (100 mg total) by mouth daily.  Dispense: 90 tablet; Refill: 3  2. Type II or unspecified type diabetes mellitus with unspecified complication, uncontrolled Lower to 1 tab a day secondary to low BS - glipiZIDE (GLUCOTROL) 10 MG tablet; Take 1 tablet (10 mg total) by mouth daily before breakfast.  Dispense: 90 tablet; Refill: 1 - Ambulatory referral to Ophthalmology  3. [facility] to evaluate---consider PT vs neuro - Ambulatory referral to [facility]  4.  + marijuana-- pt given number for psych for help 5.  Hypoglycemia-- see above

## 2014-03-12 NOTE — Progress Notes (Signed)
Pre visit review using our clinic review tool, if applicable. No additional management support is needed unless otherwise documented below in the visit note. 

## 2014-03-12 NOTE — Patient Instructions (Signed)

## 2014-03-21 ENCOUNTER — Telehealth: Payer: Self-pay

## 2014-03-21 NOTE — Telephone Encounter (Signed)
Relevant patient education assigned to patient using Emmi. ° °

## 2014-03-25 ENCOUNTER — Other Ambulatory Visit: Payer: Self-pay | Admitting: Family Medicine

## 2014-03-25 NOTE — Telephone Encounter (Signed)
Last seen 03/12/14 and filled 01/22/14 #30 with 1 refill.  UDS + for Marijuana, however patient has been referred to counselor at last [place].  Please advise      KP

## 2014-03-28 ENCOUNTER — Ambulatory Visit: Payer: 59 | Admitting: Licensed Clinical Social Worker

## 2014-04-01 DIAGNOSIS — F3289 Other specified depressive episodes: Secondary | ICD-10-CM

## 2014-04-01 DIAGNOSIS — E119 Type 2 diabetes mellitus without complications: Secondary | ICD-10-CM

## 2014-04-01 DIAGNOSIS — M545 Low back pain, unspecified: Secondary | ICD-10-CM

## 2014-04-01 DIAGNOSIS — F329 Major depressive disorder, single episode, unspecified: Secondary | ICD-10-CM

## 2014-04-02 ENCOUNTER — Telehealth: Payer: Self-pay | Admitting: Family Medicine

## 2014-04-02 ENCOUNTER — Other Ambulatory Visit: Payer: Self-pay | Admitting: Family Medicine

## 2014-04-02 DIAGNOSIS — L600 Ingrowing nail: Secondary | ICD-10-CM

## 2014-04-02 NOTE — Telephone Encounter (Signed)
Can we place a referral to podiatry for this patient for the ingrown toenail or do you want to see her first?

## 2014-04-02 NOTE — Telephone Encounter (Signed)
Ok to put referral in 

## 2014-04-02 NOTE — Telephone Encounter (Signed)
Detail message left advising result has been received and in progress. Call if any concerns   KP

## 2014-04-02 NOTE — Telephone Encounter (Signed)
04/02/14  Pt has an ingrown toe nail and is wanting to know who does she need to see to have it taken care of.  Pt has hx of diabetes.  Please advise.

## 2014-04-03 ENCOUNTER — Other Ambulatory Visit: Payer: Self-pay | Admitting: Family Medicine

## 2014-04-03 MED ORDER — TRAMADOL HCL 50 MG PO TABS: ORAL_TABLET | ORAL | Status: DC

## 2014-04-03 MED ORDER — ATORVASTATIN CALCIUM 20 MG PO TABS: ORAL_TABLET | ORAL | Status: DC

## 2014-04-03 NOTE — Telephone Encounter (Signed)
Last seen 03/12/14 and filled 04/26/13 #30.   UDS + for Marijuana, however patient has been referred to counselor at last [place] and she has had an appt.  Please advise KP

## 2014-04-03 NOTE — Telephone Encounter (Signed)
Patient called to request refills for Lipitor and Tramadol .   Pharmacy [facility], [place]

## 2014-04-03 NOTE — Addendum Note (Signed)
Addended by: Ewing Schlein on: 04/03/2014 10:12 AM   Modules accepted: Orders

## 2014-04-05 ENCOUNTER — Ambulatory Visit: Payer: Self-pay | Admitting: Podiatry

## 2014-04-09 ENCOUNTER — Ambulatory Visit (INDEPENDENT_AMBULATORY_CARE_PROVIDER_SITE_OTHER): Payer: Medicare Other | Admitting: Podiatry

## 2014-04-09 ENCOUNTER — Encounter: Payer: Self-pay | Admitting: Podiatry

## 2014-04-09 VITALS — BP 140/79 | HR 93 | Resp 18 | Ht 62.0 in | Wt 143.0 lb

## 2014-04-09 DIAGNOSIS — B351 Tinea unguium: Secondary | ICD-10-CM

## 2014-04-09 DIAGNOSIS — M79609 Pain in unspecified limb: Secondary | ICD-10-CM

## 2014-04-09 DIAGNOSIS — E1149 Type 2 diabetes mellitus with other diabetic neurological complication: Secondary | ICD-10-CM

## 2014-04-09 NOTE — Progress Notes (Signed)
   Subjective:    Patient ID: Sheila [name], female    DOB: 26-Aug-1951, 63 y.o.   MRN: [id number]  HPI Comments: N ingrown toenail L right 1st lateral toenail border D 2 weeks O  C encurvated, thick toenail and pain A drop foot brace, diabetes T no treatment     Review of Systems  All other systems reviewed and are negative.      Objective:   Physical Exam: I have reviewed her past medical history medications allergies surgeries social history and review of systems area pulses are palpable bilateral. Neurologic sensorium is intact. Muscle strength is 5 over 5 dorsiflexors plantar flexors inverters everters all intrinsic musculature is intact orthopedic evaluation demonstrates mild hammertoe deformity mild HAV deformities are asymptomatic bilateral and are flexible. Nails are thick yellow dystrophic onychomycotic and painful palpation.        Assessment & Plan:  Assessment: Pain in limb secondary to onychomycosis 1 through 5 bilateral.  Plan: Debridement of nails today one through 5 bilateral is cover service secondary to pain and diabetes. We'll followup with her in 3 months

## 2014-04-11 ENCOUNTER — Telehealth: Payer: Self-pay | Admitting: Family Medicine

## 2014-04-11 NOTE — Telephone Encounter (Signed)
Patient called and states that she received a back brace in the mail from [facility]. Patient called to see if Dr Etter Sjogren order this for her. Please advise.

## 2014-04-11 NOTE — Telephone Encounter (Signed)
Spoke with the patient and I made her aware she can send the back brace back to the company and she agreed to do so.      KP

## 2014-04-12 ENCOUNTER — Ambulatory Visit: Payer: Medicare Other | Admitting: Podiatry

## 2014-04-25 ENCOUNTER — Other Ambulatory Visit: Payer: Self-pay | Admitting: Family Medicine

## 2014-04-25 NOTE — Telephone Encounter (Signed)
Last seen 03/12/14 and filled 03/25/14 #30 UDS + for Marijuana, however patient has been to a counselor for help.    Please advise KP

## 2014-04-25 NOTE — Telephone Encounter (Signed)
Ok to refill but need UDS 

## 2014-04-25 NOTE — Telephone Encounter (Signed)
Refill x1    uds

## 2014-04-30 ENCOUNTER — Ambulatory Visit (INDEPENDENT_AMBULATORY_CARE_PROVIDER_SITE_OTHER): Payer: 59 | Admitting: Licensed Clinical Social Worker

## 2014-04-30 DIAGNOSIS — F321 Major depressive disorder, single episode, moderate: Secondary | ICD-10-CM

## 2014-05-03 ENCOUNTER — Other Ambulatory Visit: Payer: Self-pay | Admitting: Family Medicine

## 2014-05-03 NOTE — Telephone Encounter (Signed)
Last seen 03/12/14 and Tramadol filled 04/03/14 #30 Lorazepam filled 04/25/14 #30.  UDS + Marijuana but patient is in counseling  Please advise       KP

## 2014-05-07 ENCOUNTER — Ambulatory Visit: Payer: 59 | Admitting: Licensed Clinical Social Worker

## 2014-05-08 ENCOUNTER — Telehealth: Payer: Self-pay | Admitting: Family Medicine

## 2014-05-08 NOTE — Telephone Encounter (Signed)
Caller name: Ivin Booty [facility]ation to pt: [facility] Call back [phone]  Reason for call:  Ivin Booty is needing verbal orders for occupation health.

## 2014-05-08 NOTE — Telephone Encounter (Signed)
Please advise      KP 

## 2014-05-08 NOTE — Telephone Encounter (Signed)
Ok to give order? 

## 2014-05-09 ENCOUNTER — Ambulatory Visit (INDEPENDENT_AMBULATORY_CARE_PROVIDER_SITE_OTHER): Payer: 59 | Admitting: Licensed Clinical Social Worker

## 2014-05-09 DIAGNOSIS — F321 Major depressive disorder, single episode, moderate: Secondary | ICD-10-CM

## 2014-05-09 NOTE — Telephone Encounter (Signed)
Verbal order given to [facility].      KP

## 2014-05-23 ENCOUNTER — Ambulatory Visit (INDEPENDENT_AMBULATORY_CARE_PROVIDER_SITE_OTHER): Payer: 59 | Admitting: Licensed Clinical Social Worker

## 2014-05-23 ENCOUNTER — Telehealth: Payer: Self-pay | Admitting: *Deleted

## 2014-05-23 DIAGNOSIS — F321 Major depressive disorder, single episode, moderate: Secondary | ICD-10-CM

## 2014-05-23 DIAGNOSIS — F3289 Other specified depressive episodes: Secondary | ICD-10-CM

## 2014-05-23 DIAGNOSIS — E119 Type 2 diabetes mellitus without complications: Secondary | ICD-10-CM

## 2014-05-23 DIAGNOSIS — R269 Unspecified abnormalities of gait and mobility: Secondary | ICD-10-CM

## 2014-05-23 DIAGNOSIS — M545 Low back pain, unspecified: Secondary | ICD-10-CM

## 2014-05-23 DIAGNOSIS — F329 Major depressive disorder, single episode, unspecified: Secondary | ICD-10-CM

## 2014-05-23 NOTE — Telephone Encounter (Signed)
Received home health certification and plan of care via fax from [facility]. Billing sheet attached and placed in blue folder for Dr. Etter Sjogren. JG//CMA

## 2014-05-27 NOTE — Telephone Encounter (Signed)
Received completed and signed form from Dr. Etter Sjogren.  All forms faxed to [facility] at faxed#[phone]).  Confirmation received.//AB/CMA

## 2014-06-03 ENCOUNTER — Other Ambulatory Visit: Payer: Self-pay | Admitting: Family Medicine

## 2014-06-03 NOTE — Telephone Encounter (Signed)
Last seen 03/12/14 and Tramadol filled 05/03/14 #30 Lorazepam filled 05/03/14 #30 Flexeril filled 04/02/14 #30 with 1   UDS + Marijuana but patient is in counseling per MD request   Please advise     KP

## 2014-06-04 ENCOUNTER — Ambulatory Visit (INDEPENDENT_AMBULATORY_CARE_PROVIDER_SITE_OTHER): Payer: 59 | Admitting: Licensed Clinical Social Worker

## 2014-06-04 DIAGNOSIS — F321 Major depressive disorder, single episode, moderate: Secondary | ICD-10-CM

## 2014-06-10 ENCOUNTER — Telehealth: Payer: Self-pay | Admitting: Family Medicine

## 2014-06-10 NOTE — Telephone Encounter (Addendum)
Caller name: Max Fickle Relation to pt: Home Health Therapist  Call back number: [phone] Pharmacy:  Reason for call: called to request a new AFO for Kailene. Ivin Booty stated that she fixed it last month and it apparently broke again. Please advise.

## 2014-06-10 NOTE — Telephone Encounter (Signed)
Could not get in contact with Max Fickle with number listed below.  Call patient to inquire about request.  Pt stated that her current AFO is no longer working properly.  Screws keep falling out.  She has replaced screws twice, now whenever she walks she hears clicking.  The device was prescribed by a physician in [place].  AFO for right leg.  Diagnosis:  Foot Drop.  Dr. Etter Sjogren has not seen patient for this issue.  An 15 minute appointment with Dr. Etter Sjogren scheduled for 06/13/14 at 2 pm.

## 2014-06-11 ENCOUNTER — Other Ambulatory Visit: Payer: Self-pay | Admitting: Family Medicine

## 2014-06-13 ENCOUNTER — Encounter: Payer: Self-pay | Admitting: Family Medicine

## 2014-06-13 ENCOUNTER — Ambulatory Visit (INDEPENDENT_AMBULATORY_CARE_PROVIDER_SITE_OTHER): Payer: Medicare Other | Admitting: Family Medicine

## 2014-06-13 VITALS — BP 120/80 | Temp 98.2°F | Wt 143.0 lb

## 2014-06-13 DIAGNOSIS — I639 Cerebral infarction, unspecified: Secondary | ICD-10-CM

## 2014-06-13 DIAGNOSIS — I6350 Cerebral infarction due to unspecified occlusion or stenosis of unspecified cerebral artery: Secondary | ICD-10-CM

## 2014-06-13 DIAGNOSIS — M62838 Other muscle spasm: Secondary | ICD-10-CM

## 2014-06-13 MED ORDER — CYCLOBENZAPRINE HCL 10 MG PO TABS: ORAL_TABLET | ORAL | Status: DC

## 2014-06-13 MED ORDER — NONFORMULARY OR COMPOUNDED ITEM: Status: DC

## 2014-06-13 NOTE — Progress Notes (Signed)
Pre visit review using our clinic review tool, if applicable. No additional management support is needed unless otherwise documented below in the visit note. 

## 2014-06-13 NOTE — Patient Instructions (Signed)
Stroke Prevention Some medical conditions and behaviors are associated with an increased chance of having a stroke. You may prevent a stroke by making healthy choices and managing medical conditions. HOW CAN I REDUCE MY RISK OF HAVING A STROKE?   Stay physically active. Get at least 30 minutes of activity on most or all days.  Do not smoke. It may also be helpful to avoid exposure to secondhand smoke.  Limit alcohol use. Moderate alcohol use is considered to be:  No more than 2 drinks per day for men.  No more than 1 drink per day for nonpregnant women.  Eat healthy foods. This involves  Eating 5 or more servings of fruits and vegetables a day.  Following a diet that addresses high blood pressure (hypertension), high cholesterol, diabetes, or obesity.  Manage your cholesterol levels.  A diet low in saturated fat, trans fat, and cholesterol and high in fiber may control cholesterol levels.  Take any prescribed medicines to control cholesterol as directed by your health care provider.  Manage your diabetes.  A controlled-carbohydrate, controlled-sugar diet is recommended to manage diabetes.  Take any prescribed medicines to control diabetes as directed by your health care provider.  Control your hypertension.  A low-salt (sodium), low-saturated fat, low-trans fat, and low-cholesterol diet is recommended to manage hypertension.  Take any prescribed medicines to control hypertension as directed by your health care provider.  Maintain a healthy weight.  A reduced-calorie, low-sodium, low-saturated fat, low-trans fat, low-cholesterol diet is recommended to manage weight.  Stop drug abuse.  Avoid taking birth control pills.  Talk to your health care provider about the risks of taking birth control pills if you are over 35 years old, smoke, get migraines, or have ever had a blood clot.  Get evaluated for sleep disorders (sleep apnea).  Talk to your health care provider about  getting a sleep evaluation if you snore a lot or have excessive sleepiness.  Take medicines as directed by your health care provider.  For some people, aspirin or blood thinners (anticoagulants) are helpful in reducing the risk of forming abnormal blood clots that can lead to stroke. If you have the irregular heart rhythm of atrial fibrillation, you should be on a blood thinner unless there is a good reason you cannot take them.  Understand all your medicine instructions.  Make sure that other other conditions (such as anemia or atherosclerosis) are addressed. SEEK IMMEDIATE MEDICAL CARE IF:   You have sudden weakness or numbness of the face, arm, or leg, especially on one side of the body.  Your face or eyelid droops to one side.  You have sudden confusion.  You have trouble speaking (aphasia) or understanding.  You have sudden trouble seeing in one or both eyes.  You have sudden trouble walking.  You have dizziness.  You have a loss of balance or coordination.  You have a sudden, severe headache with no known cause.  You have new chest pain or an irregular heartbeat. Any of these symptoms may represent a serious problem that is an emergency. Do not wait to see if the symptoms will go away. Get medical help at once. Call your local emergency services  (911 in [place] Do not drive yourself to the hospital. Document Released: 01/20/2005 Document Revised: 10/03/2013 Document Reviewed: 06/15/2013 [employer] Patient Information 2015 [employer]. This information is not intended to replace advice given to you by your health care provider. Make sure you discuss any questions you have with your health   care provider.  

## 2014-06-13 NOTE — Progress Notes (Signed)
   Subjective:    Patient ID: Sheila [name], female    DOB: 02/06/1951, 63 y.o.   MRN: [id number]  HPI Pt here to get an rx for a new AFO for her R low leg.  She also needs a refill on flexeril.   No other complaints.    Review of Systems    as above  Objective:   Physical Exam  BP 120/80  Temp(Src) 98.2 F (36.8 C) (Oral)  Wt 143 lb (64.864 kg) General appearance: alert, cooperative, appears stated age and no distress Neck: no adenopathy, supple, symmetrical, trachea midline and thyroid not enlarged, symmetric, no tenderness/mass/nodules Lungs: clear to auscultation bilaterally Heart: regular rate and rhythm, S1, S2 normal, no murmur, click, rub or gallop Extremities: extremities normal, atraumatic, no cyanosis or edema      Assessment & Plan:  1. CVA (cerebral vascular accident)  - NONFORMULARY OR COMPOUNDED ITEM; AFO for R leg for dropped foot  Dx history of cva  Dispense: 1 each; Refill: 0  2. Muscle spasm  - cyclobenzaprine (FLEXERIL) 10 MG tablet; TAKE ONE TABLET BY MOUTH THREE TIMES DAILY AS NEEDED FOR MUSCLE SPASM  Dispense: 30 tablet; Refill: 0

## 2014-06-18 MED ORDER — NONFORMULARY OR COMPOUNDED ITEM: Status: DC

## 2014-06-18 NOTE — Telephone Encounter (Signed)
Prescription signed by Dr. Etter Sjogren and faxed to [facility].  Called the facility to ensure they had received it.  Sheila [name], a staff member at [facility], stated that they had received it and that the original did not have to be mailed.  Pt called and made aware.    During the call the patient also requested a refill on her Meloxicam 15 mg by Daily.  States she has been out of the medication for two weeks.  This medication was originally prescribed by Dr. Lynann Bologna.  Would like prescription sent to [facility] on [address].   Please advise.

## 2014-06-18 NOTE — Telephone Encounter (Signed)
Pt is unable to find the original prescription and therefore needs [place] to fax a copy to [facility] as well as mail the actual prescription.  Fax #: [phone].  Address listed as [address], [place], [place] [address].  Prescription printed.  Awaiting Dr. Nonda Lou signature.

## 2014-06-18 NOTE — Telephone Encounter (Signed)
We gave it to her when she was here

## 2014-06-18 NOTE — Telephone Encounter (Signed)
Please advise 

## 2014-06-18 NOTE — Telephone Encounter (Signed)
Patient called stating that she is currently at [facility] and they are needing a rx for a leg brace. Please advise.  [facility] phone umber:[phone] Fax number:[phone]

## 2014-06-20 MED ORDER — MELOXICAM 15 MG PO TABS: 15.0000 mg | ORAL_TABLET | Freq: Every day | ORAL | Status: DC

## 2014-06-20 NOTE — Telephone Encounter (Signed)
Ok to refill mobic x---3 refills

## 2014-06-20 NOTE — Addendum Note (Signed)
Addended by: Rudene Anda on: 06/20/2014 12:07 PM   Modules accepted: Orders

## 2014-06-20 NOTE — Telephone Encounter (Signed)
Mobic Rx sent to [facility] on [address].  Pt made aware.  No further needs at this time.

## 2014-06-25 ENCOUNTER — Ambulatory Visit (INDEPENDENT_AMBULATORY_CARE_PROVIDER_SITE_OTHER): Payer: 59 | Admitting: Licensed Clinical Social Worker

## 2014-06-25 DIAGNOSIS — F321 Major depressive disorder, single episode, moderate: Secondary | ICD-10-CM

## 2014-07-04 ENCOUNTER — Other Ambulatory Visit: Payer: Self-pay | Admitting: Family Medicine

## 2014-07-04 NOTE — Telephone Encounter (Signed)
Refill Request:  traMADol (ULTRAM) 50 MG tablet---TAKE ONE TABLET BY MOUTH EVERY 6 HOURS AS NEEDED FOR PAIN  Last Filled:  06/03/14 Amt Filled: 30, 0 refills Last OV:  06/13/14 Contract: on file UDS 01/23/14: High risk  Please advise.

## 2014-07-04 NOTE — Telephone Encounter (Signed)
Needs UDS 

## 2014-07-05 NOTE — Telephone Encounter (Addendum)
Rx sent to [facility] on [address].  Prescription on hold until patient comes in for UDS.  Lab appointment scheduled for Mon., July 08, 2014 @ 1000.

## 2014-07-08 ENCOUNTER — Other Ambulatory Visit: Payer: Medicare Other

## 2014-07-08 ENCOUNTER — Other Ambulatory Visit: Payer: Self-pay | Admitting: Family Medicine

## 2014-07-08 NOTE — Telephone Encounter (Signed)
Last seen and filled 06/13/14 #30 UDS +Marijuana  Please advise     KP

## 2014-07-11 ENCOUNTER — Ambulatory Visit (INDEPENDENT_AMBULATORY_CARE_PROVIDER_SITE_OTHER): Payer: 59 | Admitting: Licensed Clinical Social Worker

## 2014-07-11 DIAGNOSIS — F321 Major depressive disorder, single episode, moderate: Secondary | ICD-10-CM

## 2014-07-22 ENCOUNTER — Other Ambulatory Visit: Payer: Self-pay | Admitting: Family Medicine

## 2014-07-22 NOTE — Telephone Encounter (Signed)
Pt made aware cyclobenzaprine (FLEXERIL) 10 MG tablet has been sent to [facility] on [address].  No further needs at this time.

## 2014-07-22 NOTE — Telephone Encounter (Signed)
Pt has a personal matter that she wants to speak with Rn about.   [phone]

## 2014-07-25 ENCOUNTER — Ambulatory Visit (INDEPENDENT_AMBULATORY_CARE_PROVIDER_SITE_OTHER): Payer: Medicare Other | Admitting: Podiatry

## 2014-07-25 ENCOUNTER — Ambulatory Visit (INDEPENDENT_AMBULATORY_CARE_PROVIDER_SITE_OTHER): Payer: 59 | Admitting: Licensed Clinical Social Worker

## 2014-07-25 DIAGNOSIS — M79609 Pain in unspecified limb: Secondary | ICD-10-CM

## 2014-07-25 DIAGNOSIS — E1149 Type 2 diabetes mellitus with other diabetic neurological complication: Secondary | ICD-10-CM

## 2014-07-25 DIAGNOSIS — B351 Tinea unguium: Secondary | ICD-10-CM

## 2014-07-25 DIAGNOSIS — F321 Major depressive disorder, single episode, moderate: Secondary | ICD-10-CM

## 2014-07-25 DIAGNOSIS — M79676 Pain in unspecified toe(s): Secondary | ICD-10-CM

## 2014-07-25 NOTE — Progress Notes (Signed)
She presents today chief complaint of painful elongated toenails.  Objective: Pulses are palpable bilateral. Nails are thick yellow dystrophic with mycotic and painful palpation.  Assessment: Pain in limb secondary to onychomycosis 1 through 5 bilateral.  Plan: Debridement of nails 1 through 5 bilateral.

## 2014-08-03 ENCOUNTER — Other Ambulatory Visit: Payer: Self-pay | Admitting: Family Medicine

## 2014-08-05 ENCOUNTER — Other Ambulatory Visit: Payer: Self-pay | Admitting: Family Medicine

## 2014-08-05 NOTE — Telephone Encounter (Signed)
Sheila [name] called she is completely out of her traMADol (ULTRAM) 50 MG. Will you let her know when you call it in  [phone]

## 2014-08-05 NOTE — Telephone Encounter (Signed)
Last seen 06/13/14 and filled 07/04/14 #30. Please advise      KP

## 2014-08-05 NOTE — Telephone Encounter (Signed)
Last seen 06/13/14 and filled 07/08/14 #30. Please advise    KP

## 2014-08-08 ENCOUNTER — Ambulatory Visit (INDEPENDENT_AMBULATORY_CARE_PROVIDER_SITE_OTHER): Payer: 59 | Admitting: Licensed Clinical Social Worker

## 2014-08-08 DIAGNOSIS — F321 Major depressive disorder, single episode, moderate: Secondary | ICD-10-CM

## 2014-08-15 ENCOUNTER — Telehealth: Payer: Self-pay | Admitting: Family Medicine

## 2014-08-15 NOTE — Telephone Encounter (Signed)
Caller name:Jim from [facility] Relation to XE:NMMHW   Call back number: [phone]   Reason for call: would like to know if form regarding diabetic supply was received .

## 2014-08-16 NOTE — Telephone Encounter (Signed)
No forms received. Which company is sending it?

## 2014-08-16 NOTE — Telephone Encounter (Signed)
[facility] is the name of the supply company

## 2014-08-20 ENCOUNTER — Ambulatory Visit (INDEPENDENT_AMBULATORY_CARE_PROVIDER_SITE_OTHER): Payer: 59 | Admitting: Licensed Clinical Social Worker

## 2014-08-20 DIAGNOSIS — F321 Major depressive disorder, single episode, moderate: Secondary | ICD-10-CM

## 2014-09-03 ENCOUNTER — Ambulatory Visit: Payer: 59 | Admitting: Licensed Clinical Social Worker

## 2014-09-03 ENCOUNTER — Other Ambulatory Visit: Payer: Self-pay | Admitting: Family Medicine

## 2014-09-03 MED ORDER — CYCLOBENZAPRINE HCL 10 MG PO TABS: ORAL_TABLET | ORAL | Status: DC

## 2014-09-03 NOTE — Telephone Encounter (Signed)
Last seen 06/13/14 and filled 07/22/14 #30. Please advise      KP

## 2014-09-03 NOTE — Telephone Encounter (Signed)
Caller name: Umaima Relation to pt: Call back number: [phone] Pharmacy: [facility] [address]   Reason for call:  Pt is needing the Rx cyclobenzaprine (FLEXERIL) 10 MG tablet refilled.  Pt is out.  Pt pt when complete.

## 2014-09-05 ENCOUNTER — Telehealth: Payer: Self-pay | Admitting: Family Medicine

## 2014-09-05 MED ORDER — TRAMADOL HCL 50 MG PO TABS: ORAL_TABLET | ORAL | Status: DC

## 2014-09-05 NOTE — Telephone Encounter (Signed)
Refill x1   2 refills 

## 2014-09-05 NOTE — Telephone Encounter (Signed)
Caller name: Daniah Relation to pt: self Call back number: [phone] Pharmacy: [facility] on [address]  Reason for call:    Patient is requesting a refill of tramadol

## 2014-09-05 NOTE — Telephone Encounter (Signed)
Last seen 06/13/14 and filled 08/05/14 #30.  please advise      KP

## 2014-09-06 NOTE — Telephone Encounter (Signed)
Pt would like you to call her to let her know when its done, pt states she does not have transportation so she will have to find a ride.

## 2014-09-06 NOTE — Telephone Encounter (Signed)
It was faxed this am.     KP

## 2014-09-06 NOTE — Telephone Encounter (Signed)
Pt following up states pharmacy still has not received rx.

## 2014-09-12 ENCOUNTER — Telehealth: Payer: Self-pay

## 2014-09-12 MED ORDER — ATORVASTATIN CALCIUM 20 MG PO TABS: ORAL_TABLET | ORAL | Status: DC

## 2014-09-12 NOTE — Telephone Encounter (Signed)
Last seen 06/13/14 and filled 08/05/14 #30. Please advise    KP

## 2014-09-12 NOTE — Telephone Encounter (Signed)
Pt is over due for labs----refill 1 month but she needs labs done 272,2  Bmp, hep, lipid

## 2014-09-13 MED ORDER — LORAZEPAM 0.5 MG PO TABS: ORAL_TABLET | ORAL | Status: DC

## 2014-09-13 NOTE — Telephone Encounter (Signed)
Letter mailed     KP 

## 2014-09-17 ENCOUNTER — Telehealth: Payer: Self-pay | Admitting: Family Medicine

## 2014-09-18 ENCOUNTER — Telehealth: Payer: Self-pay | Admitting: Family Medicine

## 2014-09-18 ENCOUNTER — Telehealth: Payer: Self-pay

## 2014-09-18 NOTE — Telephone Encounter (Signed)
Pt stated they wbc to schedule AWV with PA or PCP.

## 2014-09-18 NOTE — Telephone Encounter (Signed)
Patient returned your phone call.  She is inquiring on schedules for [place] office for [facility]V.

## 2014-09-20 NOTE — Telephone Encounter (Signed)
Pt will call right back.

## 2014-09-24 ENCOUNTER — Telehealth: Payer: Self-pay | Admitting: Family Medicine

## 2014-09-24 NOTE — Telephone Encounter (Signed)
Caller name: Roanna Epley  Relation to pt: [facility]  Call back number: [phone]   Reason for call:  [facility] faxed over request regarding Diabetic Supplies

## 2014-09-24 NOTE — Telephone Encounter (Signed)
Order has not been received     KP

## 2014-10-02 ENCOUNTER — Telehealth: Payer: Self-pay | Admitting: Family Medicine

## 2014-10-02 MED ORDER — CYCLOBENZAPRINE HCL 10 MG PO TABS: ORAL_TABLET | ORAL | Status: DC

## 2014-10-02 NOTE — Telephone Encounter (Signed)
Caller name: Miette Relation to pt: Call back number:[phone] Pharmacy: [facility] [place], [place].  Reason for call:  Pt wants a refill on Rx cyclobenzaprine (FLEXERIL) 10 MG tablet .  Last filled on 9/8

## 2014-10-08 ENCOUNTER — Other Ambulatory Visit (INDEPENDENT_AMBULATORY_CARE_PROVIDER_SITE_OTHER): Payer: Medicare Other

## 2014-10-08 ENCOUNTER — Telehealth: Payer: Self-pay | Admitting: Family

## 2014-10-08 ENCOUNTER — Encounter: Payer: Self-pay | Admitting: Family

## 2014-10-08 ENCOUNTER — Ambulatory Visit (INDEPENDENT_AMBULATORY_CARE_PROVIDER_SITE_OTHER): Payer: Medicare Other | Admitting: Family

## 2014-10-08 VITALS — BP 140/88 | HR 90 | Temp 97.9°F | Resp 16 | Ht 62.0 in | Wt 169.4 lb

## 2014-10-08 DIAGNOSIS — E785 Hyperlipidemia, unspecified: Secondary | ICD-10-CM

## 2014-10-08 DIAGNOSIS — R635 Abnormal weight gain: Secondary | ICD-10-CM

## 2014-10-08 DIAGNOSIS — Z23 Encounter for immunization: Secondary | ICD-10-CM

## 2014-10-08 DIAGNOSIS — H6123 Impacted cerumen, bilateral: Secondary | ICD-10-CM

## 2014-10-08 DIAGNOSIS — Z0000 Encounter for general adult medical examination without abnormal findings: Secondary | ICD-10-CM

## 2014-10-08 DIAGNOSIS — H6120 Impacted cerumen, unspecified ear: Secondary | ICD-10-CM | POA: Insufficient documentation

## 2014-10-08 DIAGNOSIS — Z1231 Encounter for screening mammogram for malignant neoplasm of breast: Secondary | ICD-10-CM

## 2014-10-08 DIAGNOSIS — E119 Type 2 diabetes mellitus without complications: Secondary | ICD-10-CM

## 2014-10-08 DIAGNOSIS — Z1239 Encounter for other screening for malignant neoplasm of breast: Secondary | ICD-10-CM

## 2014-10-08 DIAGNOSIS — Z1211 Encounter for screening for malignant neoplasm of colon: Secondary | ICD-10-CM

## 2014-10-08 LAB — BASIC METABOLIC PANEL
BUN: 17 mg/dL (ref 6–23)
CHLORIDE: 104 meq/L (ref 96–112)
CO2: 24 mEq/L (ref 19–32)
Calcium: 9.6 mg/dL (ref 8.4–10.5)
Creatinine, Ser: 1 mg/dL (ref 0.4–1.2)
GFR: 57.9 mL/min — ABNORMAL LOW (ref >60.00)
Glucose, Bld: 111 mg/dL — ABNORMAL HIGH (ref 70–99)
Potassium: 4 mEq/L (ref 3.5–5.1)
SODIUM: 138 meq/L (ref 135–145)

## 2014-10-08 LAB — HEPATIC FUNCTION PANEL
ALT: 20 U/L (ref 0–35)
AST: 19 U/L (ref 0–37)
Albumin: 3.7 g/dL (ref 3.5–5.2)
Alkaline Phosphatase: 91 U/L (ref 39–117)
BILIRUBIN DIRECT: 0.1 mg/dL (ref 0.0–0.3)
BILIRUBIN TOTAL: 0.8 mg/dL (ref 0.2–1.2)
Total Protein: 7.6 g/dL (ref 6.0–8.3)

## 2014-10-08 LAB — CBC
HCT: 38.7 % (ref 36.0–46.0)
Hemoglobin: 13.1 g/dL (ref 12.0–15.0)
MCHC: 33.7 g/dL (ref 30.0–36.0)
MCV: 92 fl (ref 78.0–100.0)
Platelets: 277 10*3/uL (ref 150.0–400.0)
RBC: 4.21 Mil/uL (ref 3.87–5.11)
RDW: 13.8 % (ref 11.5–15.5)
WBC: 5.8 10*3/uL (ref 4.0–10.5)

## 2014-10-08 LAB — TSH: TSH: 1.28 u[IU]/mL (ref 0.35–4.50)

## 2014-10-08 LAB — HEMOGLOBIN A1C: Hgb A1c MFr Bld: 5.4 % (ref 4.6–6.5)

## 2014-10-08 NOTE — Assessment & Plan Note (Signed)
Currently maintained on Atorvastatin. Continue current medication. Will check liver function.

## 2014-10-08 NOTE — Progress Notes (Signed)
Pre visit review using our clinic review tool, if applicable. No additional management support is needed unless otherwise documented below in the visit note. 

## 2014-10-08 NOTE — Assessment & Plan Note (Signed)
Diabetic foot exam completed. Refer to opthamology for overdue DM eye exam. Check A1c. Continue metformin as prescribed. Follow up in 3 months.

## 2014-10-08 NOTE — Assessment & Plan Note (Signed)
Attempted to clean in the office, unsuccessful. Will refer to ENT.

## 2014-10-08 NOTE — Addendum Note (Signed)
Addended by: Delice Bison E on: 10/08/2014 02:08 PM   Modules accepted: Orders

## 2014-10-08 NOTE — Patient Instructions (Signed)
Thank you for choosing [facility].  Summary/Instructions:   You have completed your annual exam today  Please stop by the lab prior to leaving for your blood work  You should be hearing back in less than a week for your mammogram, colonoscopy, opthalmology, and ENT. If you have not heard back, please let us know.  Continue taking your medication as prescribed.  Plan to follow up for your diabetes in about 3 months.

## 2014-10-08 NOTE — Telephone Encounter (Signed)
Prescription for Ativan 0.5mg  was faxed to the pharmacy on (09/13/14).  Confirmation received.//AB/CMA

## 2014-10-08 NOTE — Progress Notes (Signed)
Subjective:    Patient ID: Sheila [name], female    DOB: 1951-12-27, 63 y.o.   MRN: [id number]  HPI:  Sheila [name] is a 63 y.o. female who presents today for her annual wellness exam.   1) Health Maintenance  Dental -- Has not been to the dentist Vision -- Has not had diabetic eye exam Immunizations -- TDAP, Declines shingles vaccination, influenza and flu shot Colonoscopy -- Will schedule  Mammogram -- Will schedule PAP -- Declines right now Bone Density -- Up to date  2) Diabetes - Currently maintained on metformin. Denies any adverse effects of the medication. Denies any hypo/hyperglycemic events. Has questions regarding a new glucose meter.  Lab Results  Component Value Date   HGBA1C 5.3 01/22/2014   3) Hyperlipidemia - currently maintained on atorvastatin. Denies any abnormal muscle pain or other adverse effects.   Lab Results  Component Value Date   CHOL 145 01/22/2014   HDL 56.20 01/22/2014   LDLCALC 71 01/22/2014   TRIG 91.0 01/22/2014   CHOLHDL 3 01/22/2014   Allergies  Allergen Reactions  . Sulfa Antibiotics   . Paxil [Paroxetine Hcl] Other (See Comments)    Dreams/ nightmares   Current Outpatient Prescriptions on File Prior to Visit  Medication Sig Dispense Refill  . aspirin 81 MG tablet Take 81 mg by mouth daily.      Marland Kitchen atorvastatin (LIPITOR) 20 MG tablet 1 tab by mouth daily  90 tablet  1  . Cholecalciferol (VITAMIN D3) 1000 UNITS CAPS Take 1 capsule by mouth daily.      . cyclobenzaprine (FLEXERIL) 10 MG tablet TAKE ONE TABLET BY MOUTH THREE TIMES DAILY AS NEEDED FOR MUSCLE SPASM  30 tablet  1  . fish oil-omega-3 fatty acids 1000 MG capsule Take 1 g by mouth daily.      Marland Kitchen glipiZIDE (GLUCOTROL) 10 MG tablet Take 1 tablet (10 mg total) by mouth daily before breakfast.  90 tablet  1  . ibuprofen (ADVIL,MOTRIN) 800 MG tablet Take 800 mg by mouth every 8 (eight) hours as needed.      Marland Kitchen LORazepam (ATIVAN) 0.5 MG tablet TAKE ONE TABLET BY MOUTH TWICE DAILY AS NEEDED  FOR ANXIETY  30 tablet  0  . magnesium 30 MG tablet Take 30 mg by mouth daily.      . meloxicam (MOBIC) 15 MG tablet Take 1 tablet (15 mg total) by mouth daily.  30 tablet  2  . metFORMIN (GLUCOPHAGE) 500 MG tablet TAKE 2 TABLETS BY MOUTH EVERY DAY  180 tablet  1  . Multiple Vitamin (MULTIVITAMIN) tablet Take 1 tablet by mouth daily.      . NONFORMULARY OR COMPOUNDED ITEM AFO for R leg for dropped foot Dx history of cva  1 each  0  . sertraline (ZOLOFT) 100 MG tablet Take 1 tablet (100 mg total) by mouth daily.  90 tablet  3  . traMADol (ULTRAM) 50 MG tablet TAKE ONE TABLET BY MOUTH EVERY 6 HOURS AS NEEDED FOR PAIN  30 tablet  2   No current facility-administered medications on file prior to visit.   Past Medical History  Diagnosis Date  . Arthritis   . Diabetes mellitus   . Stroke   . Seizures   . Hyperlipidemia     Review of Systems ROS General: Denies fever, chills, fatigue, or significant weight gain/loss. Skin: Denies rashes, lumps, itching, dryness, color changes, or hair/nail changes HENT:  Head: Denies headache or neck pain  Ears: Denies changes in hearing, ringing in ears, earache, drainage  Eyes: Denies loss/changes in vision, pain, redness, blurry/double vision, flashing  lights  Nose: Denies discharge, stuffiness, itching, nosebleed, sinus pain  Throat: Denies sore throat, hoarseness, dry mouth, sores, thrush Neck: Denies lumps, swollen glands, stiffness Breasts: Denies lumps, pain, discharge Respiratory: Denies shortness of breath, cough, sputum production, wheezing Cardiovascular: Denies chest pain/discomfort, tightness, palpitations, shortness of breath with activity, difficulty lying down, swelling, sudden awakening with shortness of breath Gastrointestinal: Denies dysphasia, heartburn, change in appetite, nausea, change in bowel habits, rectal bleeding, constipation, diarrhea, yellow skin or eyes Urinary: Denies frequency, urgency, burning/pain, blood in urine,  incontinence, change in urinary strength. Vascular: Denies calf pain with walking and leg cramping. Musculoskeletal: Denies muscle/joint pain, stiffness, back pain, redness or swelling of joints, trauma Neurological: Denies dizziness, fainting, seizures, weakness, numbness, tingling, tremor Hematologic - Denies ease of bruising or bleeding Endocrine - Denies heat or cold intolerance, sweating, frequent urination, excessive thirst, changes in appetite Psychiatric - Denies nervousness, stress, depression or Occasional disorientation     Objective:     BP 140/88  Pulse 90  Temp(Src) 97.9 F (36.6 C) (Oral)  Resp 16  Wt 169 lb 6.4 oz (76.839 kg)  SpO2 97% Nursing note and vital signs reviewed. Wt Readings from Last 3 Encounters:  10/08/14 169 lb 6.4 oz (76.839 kg)  06/13/14 143 lb (64.864 kg)  04/09/14 143 lb (64.864 kg)   Physical Exam  Constitutional: She is oriented to person, place, and time. She appears well-developed and well-nourished. No distress.  HENT:  Head: Normocephalic.  Right Ear: External ear normal.  Left Ear: External ear normal.  Mouth/Throat: Oropharynx is clear and moist.  Cerumen impaction noted bilaterally.   Eyes: Conjunctivae are normal. Pupils are equal, round, and reactive to light.  Neck: Normal range of motion. Neck supple.  Cardiovascular: Normal rate, regular rhythm, normal heart sounds and intact distal pulses.   Pulmonary/Chest: Effort normal and breath sounds normal. Right breast exhibits no inverted nipple, no mass, no nipple discharge, no skin change and no tenderness. Left breast exhibits no inverted nipple, no mass, no nipple discharge, no skin change and no tenderness.  Abdominal: Soft. Bowel sounds are normal. She exhibits no distension and no mass. There is no tenderness. There is no rebound and no guarding.  Neurological: She is alert and oriented to person, place, and time. No cranial nerve deficit.  Skin: Skin is warm and dry.    Psychiatric: She has a normal [name] and affect. Her behavior is normal. Judgment and thought content normal.       Assessment & Plan:

## 2014-10-08 NOTE — Telephone Encounter (Signed)
Please call and inform the patient that all of her labs were within the expected range. There are no changes to any medications that are needed now and continue taking her current regimen.

## 2014-10-08 NOTE — Assessment & Plan Note (Signed)
Reviewed patient preventative care recommendations. Received a tetanus vaccination today. Referred for eye exam, colonoscopy and mammogram.   Declined Zostovax and PPSV 23 and 13. Declined PAP smear at this time.   Next prevention exam in 1 year.

## 2014-10-09 NOTE — Telephone Encounter (Signed)
Called pt to let her know labs were normal.

## 2014-10-11 NOTE — Addendum Note (Signed)
Addended by: Estell Harpin T on: 10/11/2014 12:31 PM   Modules accepted: Orders

## 2014-10-11 NOTE — Addendum Note (Signed)
Addended by: Mauricio Po D on: 10/11/2014 11:10 AM   Modules accepted: Orders

## 2014-10-15 ENCOUNTER — Encounter: Payer: Self-pay | Admitting: Internal Medicine

## 2014-10-16 ENCOUNTER — Telehealth: Payer: Self-pay | Admitting: Family Medicine

## 2014-10-16 ENCOUNTER — Other Ambulatory Visit: Payer: Self-pay | Admitting: Family

## 2014-10-16 DIAGNOSIS — Z1231 Encounter for screening mammogram for malignant neoplasm of breast: Secondary | ICD-10-CM

## 2014-10-16 NOTE — Telephone Encounter (Signed)
Caller name: Emaree Relation to pt: self Call back number: [phone] [facility] [address]  Reason for call:   Patient is requesting a meloxicam refill

## 2014-10-17 MED ORDER — MELOXICAM 15 MG PO TABS
15.0000 mg | ORAL_TABLET | Freq: Every day | ORAL | Status: DC
Start: 2014-10-17 — End: 2014-11-05

## 2014-10-17 NOTE — Telephone Encounter (Signed)
Sheena could you print the last UDS.    KP

## 2014-10-17 NOTE — Telephone Encounter (Signed)
Okay meloxicam #30 and 3 refills Ask lab to print UDS  from 06/13/2014

## 2014-10-17 NOTE — Telephone Encounter (Signed)
Refill Request for   Meloxicam last filled 06/20/14  #30 with 2 refills.  Ativan last filled 09/13/14 #30 with 0 refills  Last seen 06/13/14 UDS is not scanned into the chart but has been done. Please advise     KP

## 2014-10-18 MED ORDER — LORAZEPAM 0.5 MG PO TABS: ORAL_TABLET | ORAL | Status: DC

## 2014-10-18 NOTE — Telephone Encounter (Signed)
Caller name: Chardae  Call back number: [phone] Pharmacy: [facility] [place], [place].   Reason for call:  Pt is needing a refill on LORazepam (ATIVAN) 0.5 MG tablet .  She rides [employer] and has to pick all meds up at same time.  Can you call when it has been sent.?

## 2014-10-18 NOTE — Telephone Encounter (Signed)
VM left advising Rx sent.     KP

## 2014-10-31 ENCOUNTER — Ambulatory Visit: Payer: Medicare Other | Admitting: Podiatry

## 2014-10-31 ENCOUNTER — Ambulatory Visit (INDEPENDENT_AMBULATORY_CARE_PROVIDER_SITE_OTHER): Payer: Medicare Other | Admitting: Podiatry

## 2014-10-31 DIAGNOSIS — B351 Tinea unguium: Secondary | ICD-10-CM

## 2014-10-31 DIAGNOSIS — M79673 Pain in unspecified foot: Secondary | ICD-10-CM

## 2014-10-31 NOTE — Progress Notes (Signed)
   Subjective:    Patient ID: Sheila [name], female    DOB: 10/12/1951, 63 y.o.   MRN: [id number]  HPI Pt is here for nail debridement    Review of Systems     Objective:   Physical Exam: Pulses are palpable bilateral. Nails are thick yellow dystrophic lytic mycotic and painful palpation.        Assessment & Plan:  Assessment: Pain in limb secondary to onychomycosis 1 through 5 bilateral.  Plan: Debridement of nails 1 through 5 bilateral covered service secondary to pain. 

## 2014-11-05 ENCOUNTER — Telehealth: Payer: Self-pay

## 2014-11-05 DIAGNOSIS — F411 Generalized anxiety disorder: Secondary | ICD-10-CM

## 2014-11-05 MED ORDER — SERTRALINE HCL 100 MG PO TABS: 100.0000 mg | ORAL_TABLET | Freq: Every day | ORAL | Status: DC

## 2014-11-05 MED ORDER — MELOXICAM 15 MG PO TABS: 15.0000 mg | ORAL_TABLET | Freq: Every day | ORAL | Status: DC

## 2014-11-05 MED ORDER — CYCLOBENZAPRINE HCL 10 MG PO TABS: ORAL_TABLET | ORAL | Status: DC

## 2014-11-05 MED ORDER — TRAMADOL HCL 50 MG PO TABS: ORAL_TABLET | ORAL | Status: DC

## 2014-11-05 MED ORDER — ATORVASTATIN CALCIUM 20 MG PO TABS: ORAL_TABLET | ORAL | Status: DC

## 2014-11-05 MED ORDER — LORAZEPAM 0.5 MG PO TABS: ORAL_TABLET | ORAL | Status: DC

## 2014-11-05 NOTE — Telephone Encounter (Signed)
Received order and forwarded to Dr. Etter Sjogren for signature. JG//CMA

## 2014-11-05 NOTE — Telephone Encounter (Signed)
Ok to send #90. 

## 2014-11-05 NOTE — Telephone Encounter (Signed)
Refill request from [place] 90 day supply on med's. Please advise on 90 day supply for Tramadol.      KP

## 2014-11-08 ENCOUNTER — Ambulatory Visit
Admission: RE | Admit: 2014-11-08 | Discharge: 2014-11-08 | Disposition: A | Discharge status: Home / Self Care | Payer: Medicare Other | Source: Ambulatory Visit | Attending: Family | Admitting: Family

## 2014-11-08 DIAGNOSIS — Z1231 Encounter for screening mammogram for malignant neoplasm of breast: Secondary | ICD-10-CM

## 2014-11-11 ENCOUNTER — Telehealth: Payer: Self-pay | Admitting: Family

## 2014-11-11 ENCOUNTER — Other Ambulatory Visit: Payer: Self-pay | Admitting: Family

## 2014-11-11 DIAGNOSIS — R928 Other abnormal and inconclusive findings on diagnostic imaging of breast: Secondary | ICD-10-CM

## 2014-11-11 NOTE — Telephone Encounter (Signed)
Forms faxed 11/08/2014. JG//CMA

## 2014-11-11 NOTE — Telephone Encounter (Signed)
Please call the patient to ensure that she has the results of the mammogram and that she has been scheduled for further evaluation.  Results - In the right breast, a possible mass warrants further evaluation

## 2014-11-14 NOTE — Telephone Encounter (Signed)
Tried calling pt. No answer left message for her to call back

## 2014-11-15 ENCOUNTER — Other Ambulatory Visit: Payer: Self-pay | Admitting: Medical

## 2014-11-18 ENCOUNTER — Telehealth: Payer: Self-pay | Admitting: Family Medicine

## 2014-11-18 MED ORDER — LORAZEPAM 0.5 MG PO TABS: ORAL_TABLET | ORAL | Status: DC

## 2014-11-18 NOTE — Telephone Encounter (Signed)
Ok to send 1 month

## 2014-11-18 NOTE — Telephone Encounter (Signed)
Caller name:Eastridge, Sereena L Relation to BW:GYKZ  Call back number: [phone]   Reason for call:  Pt never received LORazepam (ATIVAN) 0.5 MG tablet, thru mail order. Requesting 1 month supply send to retail [facility] [phone] and the remaining to [place], [place]  [phone]

## 2014-11-18 NOTE — Telephone Encounter (Signed)
Rx faxed.    KP 

## 2014-11-18 NOTE — Telephone Encounter (Signed)
Please advise      KP 

## 2014-11-27 ENCOUNTER — Telehealth: Payer: Self-pay

## 2014-11-27 NOTE — Telephone Encounter (Signed)
Pt called back to let us know that she does have a further evaluation on her mammogram on 12/03/14. She will schedule a follow up with Marya Amsler afterwards.

## 2014-12-03 ENCOUNTER — Ambulatory Visit
Admission: RE | Admit: 2014-12-03 | Discharge: 2014-12-03 | Disposition: A | Discharge status: Home / Self Care | Payer: Medicare Other | Source: Ambulatory Visit | Attending: Family | Admitting: Family

## 2014-12-03 ENCOUNTER — Telehealth: Payer: Self-pay | Admitting: Family

## 2014-12-03 DIAGNOSIS — R928 Other abnormal and inconclusive findings on diagnostic imaging of breast: Secondary | ICD-10-CM

## 2014-12-03 NOTE — Telephone Encounter (Signed)
Please patient inform her that her secondary mammogram was normal. Mammogram is recommended in one year.

## 2014-12-04 ENCOUNTER — Telehealth: Payer: Self-pay | Admitting: Family Medicine

## 2014-12-04 MED ORDER — CYCLOBENZAPRINE HCL 10 MG PO TABS: ORAL_TABLET | ORAL | Status: DC

## 2014-12-04 NOTE — Telephone Encounter (Signed)
Caller name: Trenace, Coughlin Relation to pt: self  Call back number: [phone] Pharmacy: [facility] [phone]  Reason for call:  Pt requesting a refill of cyclobenzaprine (FLEXERIL) 10 MG tablet please send to [facility] [place], [place], [place] [address] :[phone]

## 2014-12-04 NOTE — Telephone Encounter (Signed)
Medication refill per patient request. Any future refills will need to go through Dr. Etter Sjogren.

## 2014-12-04 NOTE — Telephone Encounter (Signed)
Patient states that this refill was supposed to go to [facility] on [address]. She is out. Please send

## 2014-12-04 NOTE — Telephone Encounter (Signed)
Pt aware that her mammo was normal. She is wanting to know if she can get a refill on flexeril she is out and needs it for pain. Please advise

## 2014-12-04 NOTE — Addendum Note (Signed)
Addended by: Reino Bellis on: 12/04/2014 04:56 PM   Modules accepted: Orders

## 2014-12-04 NOTE — Telephone Encounter (Signed)
Sent Rx to correct pharmacy. [facility] [address].

## 2014-12-05 NOTE — Telephone Encounter (Signed)
Left pt a message to call back. 

## 2014-12-06 ENCOUNTER — Other Ambulatory Visit: Payer: Self-pay

## 2014-12-06 DIAGNOSIS — E118 Type 2 diabetes mellitus with unspecified complications: Secondary | ICD-10-CM

## 2014-12-06 MED ORDER — METFORMIN HCL 500 MG PO TABS: 1000.0000 mg | ORAL_TABLET | Freq: Every day | ORAL | Status: DC

## 2014-12-06 MED ORDER — GLIPIZIDE 10 MG PO TABS: 10.0000 mg | ORAL_TABLET | Freq: Every day | ORAL | Status: DC

## 2014-12-06 NOTE — Telephone Encounter (Signed)
Sheila [name], pls return pt's call 12/14 she is returning your call.

## 2014-12-09 ENCOUNTER — Telehealth: Payer: Self-pay | Admitting: Family Medicine

## 2014-12-09 NOTE — Telephone Encounter (Signed)
Pt calling asking for a call back [phone]

## 2014-12-09 NOTE — Telephone Encounter (Signed)
Ok with me 

## 2014-12-09 NOTE — Telephone Encounter (Signed)
Pt request to transfer from Dr. Etter Sjogren to Terri Piedra. Pt is having a hard time going out to [place] that's why she request to transfer.

## 2014-12-10 ENCOUNTER — Encounter: Payer: Self-pay | Admitting: Family Medicine

## 2014-12-10 NOTE — Telephone Encounter (Signed)
Pt called and aware of the change 

## 2014-12-13 ENCOUNTER — Ambulatory Visit: Payer: Self-pay | Admitting: Internal Medicine

## 2014-12-16 ENCOUNTER — Encounter: Payer: Self-pay | Admitting: Internal Medicine

## 2014-12-16 NOTE — Progress Notes (Signed)
Patient ID: Sheila [name], female   DOB: 12-11-1951, 63 y.o.   MRN: [id number] The patient's chart has been reviewed by Dr. Silvano Rusk  and the recommendations are noted below.    Follow-up necessary. Contact patient and re- schedule.  Letter mailed to patient.

## 2014-12-30 ENCOUNTER — Telehealth: Payer: Self-pay | Admitting: Family

## 2014-12-30 NOTE — Telephone Encounter (Signed)
Noted  

## 2014-12-30 NOTE — Telephone Encounter (Signed)
Is requesting a back brace.  States a company (she does not know the name of) is sending over a request to have filled.  She is requesting this order to be filled.  Please advise.

## 2014-12-31 ENCOUNTER — Telehealth: Payer: Self-pay | Admitting: Family

## 2014-12-31 NOTE — Telephone Encounter (Signed)
Please call patient and inform her that she will need to make an OV to be assessed for back brace need since she has not been seen since last June for this problem.

## 2015-01-01 NOTE — Telephone Encounter (Signed)
Called pt to let her know that she needs to make an appointment to be further evaluated for back brace. Transferred over to scheduling for her to do so.

## 2015-01-02 ENCOUNTER — Ambulatory Visit (INDEPENDENT_AMBULATORY_CARE_PROVIDER_SITE_OTHER): Payer: Medicare Other | Admitting: Family

## 2015-01-02 ENCOUNTER — Encounter: Payer: Self-pay | Admitting: Family

## 2015-01-02 VITALS — BP 150/88 | HR 95 | Temp 98.1°F | Resp 18 | Ht 62.75 in | Wt 170.8 lb

## 2015-01-02 DIAGNOSIS — M545 Low back pain, unspecified: Secondary | ICD-10-CM

## 2015-01-02 NOTE — Progress Notes (Signed)
Pre visit review using our clinic review tool, if applicable. No additional management support is needed unless otherwise documented below in the visit note. 

## 2015-01-02 NOTE — Progress Notes (Signed)
Subjective:    Patient ID: Sheila [name], female    DOB: [birth year], 64 y.o.   MRN: [id number]  Chief Complaint  Patient presents with  . Back Pain    Here to get evaluation of her back for paper work    HPI:  Sheila [name] is a 64 y.o. female who presents today for follow up.  Back pain started after multiple falls down steps about a year and a half ago. X-rays at the time revealed a Grade I anterior slip L4-L5 and was negative for fracture. She was sent to physical therapy several times and was sent home with a home exercise program secondary to cost of therapy.   Currently she is experiencing continued dull/achy right sided low back pain with radiation to her right extremity. Pain is currently rated a 5-6/10 which increases at certain times. Currently treating with Flexeril and tramadol. Not doing any therapies at the moment. Denies being seen by any back specialists.   Allergies  Allergen Reactions  . Sulfa Antibiotics   . Paxil [Paroxetine Hcl] Other (See Comments)    Dreams/ nightmares    Current Outpatient Prescriptions on File Prior to Visit  Medication Sig Dispense Refill  . aspirin 81 MG tablet Take 81 mg by mouth daily.    Marland Kitchen atorvastatin (LIPITOR) 20 MG tablet 1 tab by mouth daily 90 tablet 1  . Cholecalciferol (VITAMIN D3) 1000 UNITS CAPS Take 1 capsule by mouth daily.    . cyclobenzaprine (FLEXERIL) 10 MG tablet TAKE ONE TABLET BY MOUTH THREE TIMES DAILY AS NEEDED FOR MUSCLE SPASM 90 tablet 0  . fish oil-omega-3 fatty acids 1000 MG capsule Take 1 g by mouth daily.    Marland Kitchen glipiZIDE (GLUCOTROL) 10 MG tablet Take 1 tablet (10 mg total) by mouth daily before breakfast. 90 tablet 1  . ibuprofen (ADVIL,MOTRIN) 800 MG tablet Take 800 mg by mouth every 8 (eight) hours as needed.    Marland Kitchen LORazepam (ATIVAN) 0.5 MG tablet TAKE ONE TABLET BY MOUTH TWICE DAILY AS NEEDED FOR ANXIETY 90 tablet 0  . magnesium 30 MG tablet Take 30 mg by mouth daily.    . meloxicam (MOBIC) 15 MG tablet Take  1 tablet (15 mg total) by mouth daily. 90 tablet 1  . metFORMIN (GLUCOPHAGE) 500 MG tablet Take 2 tablets (1,000 mg total) by mouth daily. 180 tablet 1  . Multiple Vitamin (MULTIVITAMIN) tablet Take 1 tablet by mouth daily.    . NONFORMULARY OR COMPOUNDED ITEM AFO for R leg for dropped foot Dx history of cva 1 each 0  . sertraline (ZOLOFT) 100 MG tablet Take 1 tablet (100 mg total) by mouth daily. 90 tablet 3  . traMADol (ULTRAM) 50 MG tablet TAKE ONE TABLET BY MOUTH EVERY 6 HOURS AS NEEDED FOR PAIN 90 tablet 0   No current facility-administered medications on file prior to visit.    Review of Systems  Musculoskeletal: Positive for back pain.  Neurological: Negative for numbness.      Objective:    BP 150/88 mmHg  Pulse 95  Temp(Src) 98.1 F (36.7 C) (Oral)  Resp 18  Ht 5' 2.75" (1.594 m)  Wt 170 lb 12.8 oz (77.474 kg)  BMI 30.49 kg/m2  SpO2 96% Nursing note and vital signs reviewed.  Physical Exam  Constitutional: She is oriented to person, place, and time. She appears well-developed and well-nourished. No distress.  Patient ambulates with walker.  Cardiovascular: Normal rate, regular rhythm, normal heart sounds and intact  distal pulses.   Pulmonary/Chest: Effort normal and breath sounds normal.  Musculoskeletal:  No obvious deformity discoloration, or edema of the lumbar spine noted. Palpable tenderness lateral lumbar spine, sacroiliac joint, and radiculopathy to right gluteal.  Neurological: She is alert and oriented to person, place, and time.  Skin: Skin is warm and dry.  Psychiatric: She has a normal [name] and affect. Her behavior is normal. Judgment and thought content normal.       Assessment & Plan:

## 2015-01-02 NOTE — Patient Instructions (Signed)
Thank you for choosing [facility].  Summary/Instructions:  Please take the Ativan (lorazepam) 1-2 tablets before the MRI.  Referrals have been made during this visit. You should expect to hear back from our schedulers in about 7-10 days in regards to establishing an appointment with the specialists we discussed.   If your symptoms worsen or fail to improve, please contact our office for further instruction, or in case of emergency go directly to the emergency room at the closest medical facility.

## 2015-01-02 NOTE — Assessment & Plan Note (Addendum)
Patient continues to have low back pain from a fall 2 years ago. Given minimal relief with conservative measures, obtain MRI of the lumbar spine. Recommend lumbar brace to decrease pain and support trunk. Pending MRI results, consider referral to orthopedics or neurosurgery. Continue current medication regimen of Flexeril and tramadol as needed. Follow up as needed.

## 2015-01-06 DIAGNOSIS — M545 Low back pain: Secondary | ICD-10-CM | POA: Diagnosis not present

## 2015-02-06 ENCOUNTER — Ambulatory Visit (INDEPENDENT_AMBULATORY_CARE_PROVIDER_SITE_OTHER): Payer: Medicare Other | Admitting: Podiatry

## 2015-02-06 DIAGNOSIS — M79673 Pain in unspecified foot: Secondary | ICD-10-CM

## 2015-02-06 DIAGNOSIS — B351 Tinea unguium: Secondary | ICD-10-CM | POA: Diagnosis not present

## 2015-02-06 NOTE — Progress Notes (Signed)
   Subjective:    Patient ID: Sheila [name], female    DOB: [birth year], 64 y.o.   MRN: [id number]  HPI Pt is here for nail debridement    Review of Systems     Objective:   Physical Exam: Pulses are palpable bilateral. Nails are thick yellow dystrophic lytic mycotic and painful palpation.        Assessment & Plan:  Assessment: Pain in limb secondary to onychomycosis 1 through 5 bilateral.  Plan: Debridement of nails 1 through 5 bilateral covered service secondary to pain.

## 2015-02-08 ENCOUNTER — Ambulatory Visit
Admission: RE | Admit: 2015-02-08 | Discharge: 2015-02-08 | Disposition: A | Discharge status: Home / Self Care | Payer: Medicare Other | Source: Ambulatory Visit | Attending: Family | Admitting: Family

## 2015-02-08 DIAGNOSIS — M5126 Other intervertebral disc displacement, lumbar region: Secondary | ICD-10-CM | POA: Diagnosis not present

## 2015-02-08 DIAGNOSIS — M545 Low back pain, unspecified: Secondary | ICD-10-CM

## 2015-02-10 ENCOUNTER — Telehealth: Payer: Self-pay | Admitting: Family

## 2015-02-10 ENCOUNTER — Other Ambulatory Visit: Payer: Self-pay

## 2015-02-10 MED ORDER — LORAZEPAM 0.5 MG PO TABS: ORAL_TABLET | ORAL | Status: DC

## 2015-02-10 NOTE — Telephone Encounter (Signed)
Patient requesting a refill of LORazepam (ATIVAN) 0.5 MG tablet [[id number]] sent to [facility] [address]

## 2015-02-10 NOTE — Telephone Encounter (Signed)
Pt informed of MRI results.

## 2015-02-10 NOTE — Telephone Encounter (Signed)
Done  KP

## 2015-02-10 NOTE — Telephone Encounter (Signed)
MD at another office refilled today. Not sure what pharmacy it went to.

## 2015-02-10 NOTE — Telephone Encounter (Signed)
Last seen 06/14/14 and filled 11/18/14 #90 UDS 01/23/14   Please advise     KP

## 2015-02-10 NOTE — Telephone Encounter (Signed)
Sorry, [name] I am on the phone with the pt regarding MRI results. She stated that she meant [facility] say [facility] on [address].

## 2015-02-10 NOTE — Telephone Encounter (Addendum)
It had not been sent yet but requested from [place], I will fax to [facility] on [address].    KP

## 2015-02-10 NOTE — Telephone Encounter (Signed)
Done See previous phone notes. Pt called multiple time.

## 2015-02-10 NOTE — Telephone Encounter (Signed)
Pt called back in inquiring

## 2015-02-10 NOTE — Telephone Encounter (Signed)
Please inform the patient that she does have nerve root impingement at 2 different levels, therefore I think the next reasonable course is to send her to neurosurgery for further management. I have placed the referral and she should hear something in the next couple of weeks if not sooner.

## 2015-02-10 NOTE — Telephone Encounter (Signed)
Patient states she has ran out of lorazepam and is requesting a refill.  States she is experiencing anxiety b/c she has been out of this med for two days.

## 2015-02-11 ENCOUNTER — Telehealth: Payer: Self-pay | Admitting: Family

## 2015-02-11 NOTE — Telephone Encounter (Signed)
Pt request MRI result. Please call pt

## 2015-02-12 NOTE — Telephone Encounter (Signed)
Pt was not understanding of results. Please advise.

## 2015-02-12 NOTE — Telephone Encounter (Signed)
Spoke with patient regarding her MRI results and answered all patient questions. She denied any further questions and will await neurosurgery consult.

## 2015-02-12 NOTE — Telephone Encounter (Signed)
Attempted to discuss results with patient. Left message on patient machine to return call.

## 2015-02-13 ENCOUNTER — Telehealth: Payer: Self-pay | Admitting: *Deleted

## 2015-02-13 NOTE — Telephone Encounter (Signed)
Dr Carlean Purl, This pt is scheduled for a PV 2-23 and then a colon with you 3-8 Tuesday. She had a new pt Ov with you 12-13-14 that she NS for. She did not RS. There is a note in the computer that states you have reviewed her records and ok for her to have a procedure. The notes with her scheduled PV states she had a colon 5 yrs ago in [place] and doesn't have records. I can not find any colon report scanned  in her chart. Does she need an OV or is she fine as scheduled. I didn't know if her records maybe had not been scanned in yet.  Please advise Thanks for your time   Marijean Niemann

## 2015-02-13 NOTE — Telephone Encounter (Signed)
Spoke with patient and she states she thinks she had a colon in 2011 that was normal. She said she knew she did not have polyps, she has no personal hx of colon cancer and she has no family hx of colon cancer. She states her last dr was a Dr. Consuello Closs in [place].  I googled his office and called and [facility] to see if we can get a report. Instructed pt that we will see what  we can find and call her after they call us back.  Dr Carlean Purl aware of her NS 12-13-14 and will inform him after we get more information. Pt may not need colon until 2021. Lelan Pons PV

## 2015-02-13 NOTE — Telephone Encounter (Signed)
Calling her to sort out better makes sense

## 2015-02-17 NOTE — Telephone Encounter (Signed)
LMOM at Dr. Milagros Reap office to call back.

## 2015-02-18 ENCOUNTER — Ambulatory Visit (AMBULATORY_SURGERY_CENTER): Payer: Self-pay

## 2015-02-18 VITALS — Ht 62.5 in | Wt 175.4 lb

## 2015-02-18 DIAGNOSIS — Z1211 Encounter for screening for malignant neoplasm of colon: Secondary | ICD-10-CM

## 2015-02-18 NOTE — Telephone Encounter (Signed)
Reason for Visit     Colonoscopy    Reason for Visit History        Current Vitals  Most recent update: 02/18/2015 11:29 AM by Selina Cooley, RN    Ht Wt BMI        5' 2.5" (1.588 m) 175 lb 6.4 oz (79.561 kg) 31.55 kg/m2        Progress Notes      Selina Cooley, RN at 02/18/2015 9:57 AM     Status: Sign at close encounter       Expand All Collapse All   Pt came into the office today for her pre-visit prior to her colonoscopy with Dr. Carlean Purl on 03/04/15.Pt states she had her last colonoscopy done 5 years ago in [place] We have not received her records yet, but the pt states everything was normal.Pt is not having any GI problems today, no hx of polyps or colon cancer, no family hx of colon cancer.Per Patti-CMA, pt can have the colonoscopy if she is having any medical problems or schedule her for a colon recall in 2021.The colon recall will be put into the computer for 2021. Pt states she does not need a colonoscopy at this time ,but will call our office if she has any problems in the future.The colonoscopy on 03/04/15 will be cancelled.Pt understood.             Not recorded        Referring Provider     Mauricio Po, FNP        All Flowsheet Templates (all recorded)     Anthropometrics    Custom Formula Data    Encounter Vitals      Referring Provider     Mauricio Po, FNP     Routing History     There are no sent or routed communications associated with this encounter.     AVS Reports     No AVS Snapshots are available for this encounter.     Smoking Cessation Audit Trail       Diabetic Foot Exam    No data filed     Diabetic Foot Form - Detailed    No data filed     Diabetic Foot Exam - Simple    No data filed     Guarantor Account: Lucia, Harm ([id number])     Relation to Patient: Account Type Service Area    Self Personal/Family [place] for  This Account     Coverage ID Payor Plan Insurance ID    [id number] [place] [id number]        Guarantor Account: Juaquina, Machnik ([id number])     Relation to Patient: Account Type Service Area    Self Personal/Family [place] for This Account     Coverage ID Payor Plan Insurance ID    [id number] [place] [id number]        Guarantor Account: Amma, Crear ([id number])     Relation to Patient: Account Type Service Area    Self Personal/Family [facility] [place] [facility]          Guarantor Account: Sesilia, Poucher ([id number])     Relation to Patient: Account Type [place]

## 2015-02-18 NOTE — Progress Notes (Signed)
Pt came into the office today for her pre-visit prior to her  colonoscopy with Dr. Carlean Purl on 03/04/15.Pt states she had her last colonoscopy done 5 years ago in [place] We have not received her records yet, but the pt states everything was normal.Pt is not having any GI problems today, no hx of polyps or colon cancer, no family hx of colon cancer.Per Patti-CMA, pt can have the colonoscopy if she is having any medical problems or schedule her for a colon recall in 2021.The colon recall will be put into the computer for 2021. Pt states she does not need a colonoscopy at this time ,but will call our office if she has any problems in the future.The colonoscopy on 03/04/15 will be cancelled.Pt understood.

## 2015-02-21 ENCOUNTER — Telehealth: Payer: Self-pay | Admitting: Family

## 2015-02-21 NOTE — Telephone Encounter (Signed)
Patient calling back in regards to vm received from [place] to give a call back in regards to MRI.

## 2015-02-24 NOTE — Telephone Encounter (Signed)
I have previously have spoken to the patient regarding her MRI results and have no new information. We are awaiting her referral to a back specialist.

## 2015-02-24 NOTE — Telephone Encounter (Signed)
Spoke with pt and let her know there were not new results about the MRI she is just waiting to hear something about her referral to the back specialist right now. Pt wanted Lorazep[facility] where they do a 90 day supply of medications. She is requesting a 90 day supply but she did state that she just got it refilled on 02/10/15 but still wants it refilled. Please advise.

## 2015-02-24 NOTE — Telephone Encounter (Signed)
Pt called want to speak to Marya Amsler or Lovena Le about MRI test result and new written rx for Lorazepam.

## 2015-02-25 MED ORDER — LORAZEPAM 0.5 MG PO TABS: ORAL_TABLET | ORAL | Status: DC

## 2015-02-25 NOTE — Telephone Encounter (Signed)
OK to refill

## 2015-02-25 NOTE — Telephone Encounter (Signed)
Medication has been faxed to pharmacy 

## 2015-03-03 ENCOUNTER — Telehealth: Payer: Self-pay | Admitting: Family

## 2015-03-03 MED ORDER — TRAMADOL HCL 50 MG PO TABS: ORAL_TABLET | ORAL | Status: DC

## 2015-03-03 NOTE — Telephone Encounter (Signed)
Pt called in requesting refill on her traMADol (ULTRAM) 50 MG tablet [[id number]]  Needs to be sent to [facility]

## 2015-03-03 NOTE — Telephone Encounter (Signed)
Ok to refill 

## 2015-03-03 NOTE — Telephone Encounter (Signed)
Printed

## 2015-03-04 ENCOUNTER — Encounter: Payer: Self-pay | Admitting: Internal Medicine

## 2015-03-06 ENCOUNTER — Telehealth: Payer: Self-pay | Admitting: Family

## 2015-03-06 NOTE — Telephone Encounter (Signed)
Went out to see patient and A1C was 8.0.

## 2015-03-10 NOTE — Telephone Encounter (Signed)
Noted. Patient will be followed up in the office.

## 2015-03-12 ENCOUNTER — Encounter: Payer: Self-pay | Admitting: Family

## 2015-03-12 ENCOUNTER — Ambulatory Visit (INDEPENDENT_AMBULATORY_CARE_PROVIDER_SITE_OTHER): Payer: Medicare Other | Admitting: Family

## 2015-03-12 VITALS — BP 140/82 | HR 102 | Temp 97.9°F | Resp 18 | Wt 171.0 lb

## 2015-03-12 DIAGNOSIS — Z0000 Encounter for general adult medical examination without abnormal findings: Secondary | ICD-10-CM

## 2015-03-12 NOTE — Patient Instructions (Addendum)
Thank you for choosing [employer].  [facility] [address], [place] [phone]  Summary/Instructions:  Exercise to Stay Healthy Exercise helps you become and stay healthy. EXERCISE IDEAS AND TIPS Choose exercises that:  You enjoy.  Fit into your day. You do not need to exercise really hard to be healthy. You can do exercises at a slow or medium level and stay healthy. You can:  Stretch before and after working out.  Try yoga, Pilates, or tai chi.  Lift weights.  Walk fast, swim, jog, run, climb stairs, bicycle, dance, or rollerskate.  Take aerobic classes. Exercises that burn about 150 calories:  Running 1  miles in 15 minutes.  Playing volleyball for 45 to 60 minutes.  Washing and waxing a car for 45 to 60 minutes.  Playing touch football for 45 minutes.  Walking 1  miles in 35 minutes.  Pushing a stroller 1  miles in 30 minutes.  Playing basketball for 30 minutes.  Raking leaves for 30 minutes.  Bicycling 5 miles in 30 minutes.  Walking 2 miles in 30 minutes.  Dancing for 30 minutes.  Shoveling snow for 15 minutes.  Swimming laps for 20 minutes.  Walking up stairs for 15 minutes.  Bicycling 4 miles in 15 minutes.  Gardening for 30 to 45 minutes.  Jumping rope for 15 minutes.  Washing windows or floors for 45 to 60 minutes. Document Released: 01/15/2011 Document Revised: 03/06/2012 Document Reviewed: 01/15/2011 [facility] Patient Information 2015 [place], [place]. This information is not intended to replace advice given to you by your health care provider. Make sure you discuss any questions you have with your health care provider.  Health Maintenance Adopting a healthy lifestyle and getting preventive care can go a long way to promote health and wellness. Talk with your health care provider about what schedule of regular examinations is right for you. This is a good chance for you  to check in with your provider about disease prevention and staying healthy. In between checkups, there are plenty of things you can do on your own. Experts have done a lot of research about which lifestyle changes and preventive measures are most likely to keep you healthy. Ask your health care provider for more information. WEIGHT AND DIET  Eat a healthy diet  Be sure to include plenty of vegetables, fruits, low-fat dairy products, and lean protein.  Do not eat a lot of foods high in solid fats, added sugars, or salt.  Get regular exercise. This is one of the most important things you can do for your health.  Most adults should exercise for at least 150 minutes each week. The exercise should increase your heart rate and make you sweat (moderate-intensity exercise).  Most adults should also do strengthening exercises at least twice a week. This is in addition to the moderate-intensity exercise.  Maintain a healthy weight  Body mass index (BMI) is a measurement that can be used to identify possible weight problems. It estimates body fat based on height and weight. Your health care provider can help determine your BMI and help you achieve or maintain a healthy weight.  For females 38 years of age and older:   A BMI below 18.5 is considered underweight.  A BMI of 18.5 to 24.9 is normal.  A BMI of 25 to 29.9 is considered overweight.  A BMI of 30 and above is considered obese.  Watch levels of cholesterol and blood lipids  You should start having your blood tested for lipids and cholesterol at 65 years of age, then have this test every 5 years.  You may need to have your cholesterol levels checked more often if:  Your lipid or cholesterol levels are high.  You are older than 65 years of age.  You are at high risk for heart disease.  CANCER SCREENING   Lung Cancer  Lung cancer screening is recommended for adults 65-62 years old who are at high risk for lung cancer because  of a history of smoking.  A yearly low-dose CT scan of the lungs is recommended for people who:  Currently smoke.  Have quit within the past 15 years.  Have at least a 30-pack-year history of smoking. A pack year is smoking an average of one pack of cigarettes a day for 1 year.  Yearly screening should continue until it has been 15 years since you quit.  Yearly screening should stop if you develop a health problem that would prevent you from having lung cancer treatment.  Breast Cancer  Practice breast self-awareness. This means understanding how your breasts normally appear and feel.  It also means doing regular breast self-exams. Let your health care provider know about any changes, no matter how small.  If you are in your 20s or 30s, you should have a clinical breast exam (CBE) by a health care provider every 1-3 years as part of a regular health exam.  If you are 87 or older, have a CBE every year. Also consider having a breast X-ray (mammogram) every year.  If you have a family history of breast cancer, talk to your health care provider about genetic screening.  If you are at high risk for breast cancer, talk to your health care provider about having an MRI and a mammogram every year.  Breast cancer gene (BRCA) assessment is recommended for women who have family members with BRCA-related cancers. BRCA-related cancers include:  Breast.  Ovarian.  Tubal.  Peritoneal cancers.  Results of the assessment will determine the need for genetic counseling and BRCA1 and BRCA2 testing. Cervical Cancer Routine pelvic examinations to screen for cervical cancer are no longer recommended for nonpregnant women who are considered low risk for cancer of the pelvic organs (ovaries, uterus, and vagina) and who do not have symptoms. A pelvic examination may be necessary if you have symptoms including those associated with pelvic infections. Ask your health care provider if a screening pelvic  exam is right for you.   The Pap test is the screening test for cervical cancer for women who are considered at risk.  If you had a hysterectomy for a problem that was not cancer or a condition that could lead to cancer, then you no longer need Pap tests.  If you are older than 65 years, and you have had normal Pap tests for the past 10 years, you no longer need to have Pap tests.  If you have had past treatment for cervical cancer or a condition that could lead to cancer, you need Pap tests and screening for cancer for at least 20 years after your treatment.  If you no longer get a Pap test, assess your risk factors if they change (such as having a new sexual partner). This can affect whether you should start being screened again.  Some women have medical problems that increase their chance of getting cervical cancer. If this is the case for you, your health care provider may recommend more  frequent screening and Pap tests.  The human papillomavirus (HPV) test is another test that may be used for cervical cancer screening. The HPV test looks for the virus that can cause cell changes in the cervix. The cells collected during the Pap test can be tested for HPV.  The HPV test can be used to screen women 70 years of age and older. Getting tested for HPV can extend the interval between normal Pap tests from three to five years.  An HPV test also should be used to screen women of any age who have unclear Pap test results.  After 65 years of age, women should have HPV testing as often as Pap tests.  Colorectal Cancer  This type of cancer can be detected and often prevented.  Routine colorectal cancer screening usually begins at 65 years of age and continues through 65 years of age.  Your health care provider may recommend screening at an earlier age if you have risk factors for colon cancer.  Your health care provider may also recommend using home test kits to check for hidden blood in the  stool.  A small camera at the end of a tube can be used to examine your colon directly (sigmoidoscopy or colonoscopy). This is done to check for the earliest forms of colorectal cancer.  Routine screening usually begins at age 6.  Direct examination of the colon should be repeated every 5-10 years through 65 years of age. However, you may need to be screened more often if early forms of precancerous polyps or small growths are found. Skin Cancer  Check your skin from head to toe regularly.  Tell your health care provider about any new moles or changes in moles, especially if there is a change in a mole's shape or color.  Also tell your health care provider if you have a mole that is larger than the size of a pencil eraser.  Always use sunscreen. Apply sunscreen liberally and repeatedly throughout the day.  Protect yourself by wearing long sleeves, pants, a wide-brimmed hat, and sunglasses whenever you are outside. HEART DISEASE, DIABETES, AND HIGH BLOOD PRESSURE   Have your blood pressure checked at least every 1-2 years. High blood pressure causes heart disease and increases the risk of stroke.  If you are between 55 years and 35 years old, ask your health care provider if you should take aspirin to prevent strokes.  Have regular diabetes screenings. This involves taking a blood sample to check your fasting blood sugar level.  If you are at a normal weight and have a low risk for diabetes, have this test once every three years after 65 years of age.  If you are overweight and have a high risk for diabetes, consider being tested at a younger age or more often. PREVENTING INFECTION  Hepatitis B  If you have a higher risk for hepatitis B, you should be screened for this virus. You are considered at high risk for hepatitis B if:  You were born in a country where hepatitis B is common. Ask your health care provider which countries are considered high risk.  Your parents were born in  a high-risk country, and you have not been immunized against hepatitis B (hepatitis B vaccine).  You have HIV or AIDS.  You use needles to inject street drugs.  You live with someone who has hepatitis B.  You have had sex with someone who has hepatitis B.  You get hemodialysis treatment.  You take certain  medicines for conditions, including cancer, organ transplantation, and autoimmune conditions. Hepatitis C  Blood testing is recommended for:  Everyone born from [birth year] through 1966.  Anyone with known risk factors for hepatitis C. Sexually transmitted infections (STIs)  You should be screened for sexually transmitted infections (STIs) including gonorrhea and chlamydia if:  You are sexually active and are younger than 65 years of age.  You are older than 65 years of age and your health care provider tells you that you are at risk for this type of infection.  Your sexual activity has changed since you were last screened and you are at an increased risk for chlamydia or gonorrhea. Ask your health care provider if you are at risk.  If you do not have HIV, but are at risk, it may be recommended that you take a prescription medicine daily to prevent HIV infection. This is called pre-exposure prophylaxis (PrEP). You are considered at risk if:  You are sexually active and do not regularly use condoms or know the HIV status of your partner(s).  You take drugs by injection.  You are sexually active with a partner who has HIV. Talk with your health care provider about whether you are at high risk of being infected with HIV. If you choose to begin PrEP, you should first be tested for HIV. You should then be tested every 3 months for as long as you are taking PrEP.  PREGNANCY   If you are premenopausal and you may become pregnant, ask your health care provider about preconception counseling.  If you may become pregnant, take 400 to 800 micrograms (mcg) of folic acid every day.  If you  want to prevent pregnancy, talk to your health care provider about birth control (contraception). OSTEOPOROSIS AND MENOPAUSE   Osteoporosis is a disease in which the bones lose minerals and strength with aging. This can result in serious bone fractures. Your risk for osteoporosis can be identified using a bone density scan.  If you are 60 years of age or older, or if you are at risk for osteoporosis and fractures, ask your health care provider if you should be screened.  Ask your health care provider whether you should take a calcium or vitamin D supplement to lower your risk for osteoporosis.  Menopause may have certain physical symptoms and risks.  Hormone replacement therapy may reduce some of these symptoms and risks. Talk to your health care provider about whether hormone replacement therapy is right for you.  HOME CARE INSTRUCTIONS   Schedule regular health, dental, and eye exams.  Stay current with your immunizations.   Do not use any tobacco products including cigarettes, chewing tobacco, or electronic cigarettes.  If you are pregnant, do not drink alcohol.  If you are breastfeeding, limit how much and how often you drink alcohol.  Limit alcohol intake to no more than 1 drink per day for nonpregnant women. One drink equals 12 ounces of beer, 5 ounces of wine, or 1 ounces of hard liquor.  Do not use street drugs.  Do not share needles.  Ask your health care provider for help if you need support or information about quitting drugs.  Tell your health care provider if you often feel depressed.  Tell your health care provider if you have ever been abused or do not feel safe at home. Document Released: 06/28/2011 Document Revised: 04/29/2014 Document Reviewed: 11/14/2013 [facility] Patient Information 2015 [place], [place]. This information is not intended to replace advice given to you  by your health care provider. Make sure you discuss any questions you have with your health  care provider.  Fall Prevention and Home Safety Falls cause injuries and can affect all age groups. It is possible to use preventive measures to significantly decrease the likelihood of falls. There are many simple measures which can make your home safer and prevent falls. OUTDOORS  Repair cracks and edges of walkways and driveways.  Remove high doorway thresholds.  Trim shrubbery on the main path into your home.  Have good outside lighting.  Clear walkways of tools, rocks, debris, and clutter.  Check that handrails are not broken and are securely fastened. Both sides of steps should have handrails.  Have leaves, snow, and ice cleared regularly.  Use sand or salt on walkways during winter months.  In the garage, clean up grease or oil spills. BATHROOM  Install night lights.  Install grab bars by the toilet and in the tub and shower.  Use non-skid mats or decals in the tub or shower.  Place a plastic non-slip stool in the shower to sit on, if needed.  Keep floors dry and clean up all water on the floor immediately.  Remove soap buildup in the tub or shower on a regular basis.  Secure bath mats with non-slip, double-sided rug tape.  Remove throw rugs and tripping hazards from the floors. BEDROOMS  Install night lights.  Make sure a bedside light is easy to reach.  Do not use oversized bedding.  Keep a telephone by your bedside.  Have a firm chair with side arms to use for getting dressed.  Remove throw rugs and tripping hazards from the floor. KITCHEN  Keep handles on pots and pans turned toward the center of the stove. Use back burners when possible.  Clean up spills quickly and allow time for drying.  Avoid walking on wet floors.  Avoid hot utensils and knives.  Position shelves so they are not too high or low.  Place commonly used objects within easy reach.  If necessary, use a sturdy step stool with a grab bar when reaching.  Keep electrical  cables out of the way.  Do not use floor polish or wax that makes floors slippery. If you must use wax, use non-skid floor wax.  Remove throw rugs and tripping hazards from the floor. STAIRWAYS  Never leave objects on stairs.  Place handrails on both sides of stairways and use them. Fix any loose handrails. Make sure handrails on both sides of the stairways are as long as the stairs.  Check carpeting to make sure it is firmly attached along stairs. Make repairs to worn or loose carpet promptly.  Avoid placing throw rugs at the top or bottom of stairways, or properly secure the rug with carpet tape to prevent slippage. Get rid of throw rugs, if possible.  Have an electrician put in a light switch at the top and bottom of the stairs. OTHER FALL PREVENTION TIPS  Wear low-heel or rubber-soled shoes that are supportive and fit well. Wear closed toe shoes.  When using a stepladder, make sure it is fully opened and both spreaders are firmly locked. Do not climb a closed stepladder.  Add color or contrast paint or tape to grab bars and handrails in your home. Place contrasting color strips on first and last steps.  Learn and use mobility aids as needed. Install an electrical emergency response system.  Turn on lights to avoid dark areas. Replace light bulbs that burn  out immediately. Get light switches that glow.  Arrange furniture to create clear pathways. Keep furniture in the same place.  Firmly attach carpet with non-skid or double-sided tape.  Eliminate uneven floor surfaces.  Select a carpet pattern that does not visually hide the edge of steps.  Be aware of all pets. OTHER HOME SAFETY TIPS  Set the water temperature for 120 F (48.8 C).  Keep emergency numbers on or near the telephone.  Keep smoke detectors on every level of the home and near sleeping areas. Document Released: 12/03/2002 Document Revised: 06/13/2012 Document Reviewed: 03/03/2012 [facility] Patient  Information 2015 [place], [place]. This information is not intended to replace advice given to you by your health care provider. Make sure you discuss any questions you have with your health care provider.

## 2015-03-12 NOTE — Progress Notes (Signed)
Subjective:    Patient ID: Sheila [name], female    DOB: 07/12/1951, 64 y.o.   MRN: [id number]  Chief Complaint  Patient presents with  . Medicare Wellness    Not fasting    HPI:  Sheila [name] is a 64 y.o. female who presents today for an annual wellness visit.   1) Health Maintenance -   Diet - Averages about 2-3 meals per day; consists of dairy, fruits, vegetables, lean proteins; 1 cup of caffeine  Exercise - Walks to get mail, but not far or often; [facility]  2) Preventative Exams / Immunizations:  Dental --  Due for exam  Vision -- Due for exam will schedule.   Health Maintenance  Topic Date Due  . HIV Screening  04/19/1965  . ZOSTAVAX  04/19/2010  . OPHTHALMOLOGY EXAM  03/09/2013  . URINE MICROALBUMIN  01/22/2015  . COLONOSCOPY  03/09/2016 (Originally 04/19/2000)  . INFLUENZA VACCINE  03/11/2016 (Originally 07/27/2014)  . HEMOGLOBIN A1C  04/09/2015  . FOOT EXAM  10/09/2015  . MAMMOGRAM  12/03/2016  . TETANUS/TDAP  10/08/2024    Immunization History  Administered Date(s) Administered  . Tdap 10/08/2014   Allergies  Allergen Reactions  . Sulfa Antibiotics   . Paxil [Paroxetine Hcl] Other (See Comments)    Dreams/ nightmares    Current Outpatient Prescriptions on File Prior to Visit  Medication Sig Dispense Refill  . aspirin 81 MG tablet Take 81 mg by mouth daily.    Marland Kitchen atorvastatin (LIPITOR) 20 MG tablet 1 tab by mouth daily 90 tablet 1  . Cholecalciferol (VITAMIN D3) 1000 UNITS CAPS Take 1 capsule by mouth daily.    . cyclobenzaprine (FLEXERIL) 10 MG tablet TAKE ONE TABLET BY MOUTH THREE TIMES DAILY AS NEEDED FOR MUSCLE SPASM 90 tablet 0  . fish oil-omega-3 fatty acids 1000 MG capsule Take 1 g by mouth daily.    Marland Kitchen glipiZIDE (GLUCOTROL) 10 MG tablet Take 1 tablet (10 mg total) by mouth daily before breakfast. 90 tablet 1  . ibuprofen (ADVIL,MOTRIN) 800 MG tablet Take 800 mg by mouth every 8 (eight) hours as needed.     Marland Kitchen LORazepam (ATIVAN) 0.5 MG tablet TAKE ONE TABLET BY MOUTH TWICE DAILY AS NEEDED FOR ANXIETY 90 tablet 0  . magnesium 30 MG tablet Take 30 mg by mouth daily.    . meloxicam (MOBIC) 15 MG tablet Take 1 tablet (15 mg total) by mouth daily. 90 tablet 1  . metFORMIN (GLUCOPHAGE) 500 MG tablet Take 2 tablets (1,000 mg total) by mouth daily. 180 tablet 1  . Multiple Vitamin (MULTIVITAMIN) tablet Take 1 tablet by mouth daily.    . NONFORMULARY OR COMPOUNDED ITEM AFO for R leg for dropped foot Dx history of cva 1 each 0  . sertraline (ZOLOFT) 100 MG tablet Take 1 tablet (100 mg total) by mouth daily. 90 tablet 3  . traMADol (ULTRAM) 50 MG tablet TAKE ONE TABLET BY MOUTH EVERY 6 HOURS AS NEEDED FOR PAIN 90 tablet 0   No current facility-administered medications on file prior to visit.    Past Medical History  Diagnosis Date  . Arthritis   . Diabetes mellitus   . Stroke   . Seizures   . Hyperlipidemia     Past Surgical History  Procedure Laterality Date  . Cholecystectomy    . Tonsillectomy      Family History  Problem Relation Age of Onset  .  Diabetes Mother   . Diabetes Brother     History   Social History  . Marital Status: Widowed    Spouse Name: N/A  . Number of Children: 3  . Years of Education: 16   Occupational History  . 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 Smoking status: Former Smoker    Types: Cigarettes    Quit date: 02/15/1992  . Smokeless tobacco: Never Used  . Alcohol Use: No  . Drug Use: No     Comment: SMOKES IT ANY TIME SHE CAN GET IT  . Sexual Activity:    Partners: Male   Other Topics Concern  . Not on file   Social History Narrative   Fun: Talk, church activities    Denies any religious beliefs effecting health care.     RISK FACTORS  Tobacco History  Smoking status  . Former Smoker  . Types: Cigarettes  . Quit date: 02/15/1992  Smokeless tobacco  . Never Used     Cardiac risk factors: diabetes mellitus,  dyslipidemia and sedentary lifestyle.  Depression Screen  Q1: Over the past two weeks, have you felt down, depressed or hopeless? No  Q2: Over the past two weeks, have you felt little interest or pleasure in doing things? No  Have you lost interest or pleasure in daily life? No  Do you often feel hopeless? No  Do you cry easily over simple problems? No  Activities of Daily Living In your present state of health, do you have any difficulty performing the following activities?:  Driving? No Managing money?  No Feeding yourself? No Getting from bed to chair? No Climbing a flight of stairs? Difficulty with stairs Preparing food and eating?: No Bathing or showering? Concern with showering and slipping Getting dressed: No Getting to the toilet? No Using the toilet: No Moving around from place to place: No In the past year have you fallen or had a near fall?:No   Home Safety Has smoke detector and wears seat belts. No firearms. No excess sun exposure. Are there smokers in your home (other than you)?  No Do you feel safe at home?  Yes  Hearing Difficulties: No Do you often ask people to speak up or repeat themselves? No Do you experience ringing or noises in your ears? No  Do you have difficulty understanding soft or whispered voices? No    Cognitive Testing  Alert? Yes   Normal Appearance? Yes  Oriented to person? Yes  Place? Yes   Time? Yes  Recall of three objects?  Yes  Can perform simple calculations? Yes  Displays appropriate judgment? Yes  Can read the correct time from a watch face? Yes  Do you feel that you have a problem with memory? Short term/occasioanl  Do you often misplace items? No   Advanced Directives have been discussed with the patient? Yes  Current Physicians/Providers and Suppliers  1. Terri Piedra, FNP - Primary Care 2. Dr. Tyson Dense, [place] any recent Medical Services you may have received from other than [facility] providers in the past  year (date may be approximate).  All answers were reviewed with the patient and necessary referrals were made:  Mauricio Po, [place]   03/12/2015    Review of Systems  Constitutional: Denies fever, chills, fatigue, or significant weight gain/loss. HENT: Head: Denies headache or neck pain Ears: Denies changes in hearing, ringing in ears, earache, drainage Nose: Denies discharge, stuffiness, itching, nosebleed,  sinus pain Throat: Denies sore throat, hoarseness, dry mouth, sores, thrush Eyes: Denies loss/changes in vision, pain, redness, blurry/double vision, flashing lights Cardiovascular: Denies chest pain/discomfort, tightness, palpitations, shortness of breath with activity, difficulty lying down, swelling, sudden awakening with shortness of breath Respiratory: Denies shortness of breath, cough, sputum production, wheezing Gastrointestinal: Denies dysphasia, heartburn, change in appetite, nausea, change in bowel habits, rectal bleeding, constipation, diarrhea, yellow skin or eyes Genitourinary: Denies frequency, urgency, burning/pain, blood in urine, incontinence, change in urinary strength. Musculoskeletal: Denies muscle/joint pain, stiffness, back pain, redness or swelling of joints, trauma Skin: Denies rashes, lumps, itching, dryness, color changes, or hair/nail changes Neurological: Denies dizziness, fainting, seizures, weakness, numbness, tingling, tremor Psychiatric - Denies nervousness, stress, depression or memory loss Endocrine: Denies heat or cold intolerance, sweating, frequent urination, excessive thirst, changes in appetite Hematologic: Denies ease of bruising or bleeding    Objective:     BP 140/82 mmHg  Pulse 102  Temp(Src) 97.9 F (36.6 C) (Oral)  Resp 18  Wt 171 lb (77.565 kg)  SpO2 97% Nursing note and vital signs reviewed.  Physical Exam  Constitutional: She is oriented to person, place, and time. She appears well-developed and well-nourished. No distress.    Cardiovascular: Normal rate, regular rhythm, normal heart sounds and intact distal pulses.   Pulmonary/Chest: Effort normal and breath sounds normal.  Neurological: She is alert and oriented to person, place, and time.  Skin: Skin is warm and dry.  Psychiatric: She has a normal [name] and affect. Her behavior is normal. Judgment and thought content normal.       Assessment & Plan:   During the course of the visit the patient was educated and counseled about appropriate screening and preventive services including:    Td vaccine  Colorectal cancer screening  Diabetes screening  Nutrition counseling   Diet review for nutrition referral? Yes ____  Not Indicated _X___   Patient Instructions (the written plan) was given to the patient.  Medicare Attestation I have personally reviewed: The patient's medical and social history Their use of alcohol, tobacco or illicit drugs Their current medications and supplements The patient's functional ability including ADLs,fall risks, home safety risks, cognitive, and hearing and visual impairment Diet and physical activities Evidence for depression or [name] disorders  The patient's weight, height, BMI,  have been recorded in the chart.  I have made referrals, counseling, and provided education to the patient based on review of the above and I have provided the patient with a written personalized care plan for preventive services.     Mauricio Po, [place]   03/12/2015

## 2015-03-12 NOTE — Assessment & Plan Note (Addendum)
Reviewed and updated patient's medical, surgical, family and social history. Medications and allergies were also reviewed. Basic screenings for depression, activities of daily living, hearing, cognition and safety were performed. Provider list was updated and health plan was provided to the patient.   Patient declines Zostavax. Information regarding ophthalmology visit provided to patient. She will call and make an appointment. Follow-up annual wellness visit in one year.

## 2015-03-12 NOTE — Progress Notes (Signed)
Pre visit review using our clinic review tool, if applicable. No additional management support is needed unless otherwise documented below in the visit note. 

## 2015-03-14 NOTE — Addendum Note (Signed)
Addended by: Mauricio Po D on: 03/14/2015 08:44 AM   Modules accepted: Miquel Dunn

## 2015-03-24 ENCOUNTER — Other Ambulatory Visit: Payer: Self-pay

## 2015-03-24 MED ORDER — TRAMADOL HCL 50 MG PO TABS: ORAL_TABLET | ORAL | Status: DC

## 2015-04-18 ENCOUNTER — Telehealth: Payer: Self-pay | Admitting: Family

## 2015-04-18 DIAGNOSIS — M5416 Radiculopathy, lumbar region: Secondary | ICD-10-CM

## 2015-04-18 NOTE — Telephone Encounter (Signed)
Referral placed. Apparently it is my mistake.

## 2015-04-18 NOTE — Telephone Encounter (Signed)
Patient states that she was under the impression that a referral for a back specialist was being placed. She states that based on the MRI results , Sheila [name] advised that a referral would be placed for a back specialist. I am not finding any notations. Please advise.

## 2015-04-30 DIAGNOSIS — M545 Low back pain: Secondary | ICD-10-CM | POA: Diagnosis not present

## 2015-05-01 DIAGNOSIS — H524 Presbyopia: Secondary | ICD-10-CM | POA: Diagnosis not present

## 2015-05-01 NOTE — Telephone Encounter (Signed)
error:[id number] ° °

## 2015-05-07 ENCOUNTER — Encounter: Payer: Self-pay | Admitting: Family

## 2015-05-08 ENCOUNTER — Ambulatory Visit: Payer: Medicare Other

## 2015-05-15 DIAGNOSIS — M545 Low back pain: Secondary | ICD-10-CM | POA: Diagnosis not present

## 2015-05-15 DIAGNOSIS — M6281 Muscle weakness (generalized): Secondary | ICD-10-CM | POA: Diagnosis not present

## 2015-05-15 DIAGNOSIS — M25551 Pain in right hip: Secondary | ICD-10-CM | POA: Diagnosis not present

## 2015-05-15 DIAGNOSIS — M62451 Contracture of muscle, right thigh: Secondary | ICD-10-CM | POA: Diagnosis not present

## 2015-05-19 DIAGNOSIS — M62451 Contracture of muscle, right thigh: Secondary | ICD-10-CM | POA: Diagnosis not present

## 2015-05-19 DIAGNOSIS — M25551 Pain in right hip: Secondary | ICD-10-CM | POA: Diagnosis not present

## 2015-05-19 DIAGNOSIS — M545 Low back pain: Secondary | ICD-10-CM | POA: Diagnosis not present

## 2015-05-19 DIAGNOSIS — M6281 Muscle weakness (generalized): Secondary | ICD-10-CM | POA: Diagnosis not present

## 2015-05-20 DIAGNOSIS — M545 Low back pain: Secondary | ICD-10-CM | POA: Diagnosis not present

## 2015-05-20 DIAGNOSIS — M25551 Pain in right hip: Secondary | ICD-10-CM | POA: Diagnosis not present

## 2015-05-20 DIAGNOSIS — M6281 Muscle weakness (generalized): Secondary | ICD-10-CM | POA: Diagnosis not present

## 2015-05-20 DIAGNOSIS — M62451 Contracture of muscle, right thigh: Secondary | ICD-10-CM | POA: Diagnosis not present

## 2015-06-02 ENCOUNTER — Other Ambulatory Visit: Payer: Self-pay | Admitting: Family Medicine

## 2015-06-05 ENCOUNTER — Other Ambulatory Visit: Payer: Self-pay

## 2015-06-05 DIAGNOSIS — M6281 Muscle weakness (generalized): Secondary | ICD-10-CM | POA: Diagnosis not present

## 2015-06-05 DIAGNOSIS — M25551 Pain in right hip: Secondary | ICD-10-CM | POA: Diagnosis not present

## 2015-06-05 DIAGNOSIS — M62451 Contracture of muscle, right thigh: Secondary | ICD-10-CM | POA: Diagnosis not present

## 2015-06-05 DIAGNOSIS — M545 Low back pain: Secondary | ICD-10-CM | POA: Diagnosis not present

## 2015-06-05 MED ORDER — LORAZEPAM 0.5 MG PO TABS: ORAL_TABLET | ORAL | Status: DC

## 2015-06-05 MED ORDER — TRAMADOL HCL 50 MG PO TABS: ORAL_TABLET | ORAL | Status: DC

## 2015-06-10 DIAGNOSIS — M6281 Muscle weakness (generalized): Secondary | ICD-10-CM | POA: Diagnosis not present

## 2015-06-10 DIAGNOSIS — M25551 Pain in right hip: Secondary | ICD-10-CM | POA: Diagnosis not present

## 2015-06-10 DIAGNOSIS — M62451 Contracture of muscle, right thigh: Secondary | ICD-10-CM | POA: Diagnosis not present

## 2015-06-10 DIAGNOSIS — M545 Low back pain: Secondary | ICD-10-CM | POA: Diagnosis not present

## 2015-06-12 ENCOUNTER — Ambulatory Visit (INDEPENDENT_AMBULATORY_CARE_PROVIDER_SITE_OTHER): Payer: Medicare Other | Admitting: Family

## 2015-06-12 ENCOUNTER — Encounter: Payer: Self-pay | Admitting: Family

## 2015-06-12 VITALS — BP 112/74 | HR 84 | Temp 98.0°F | Resp 18 | Ht 62.75 in | Wt 172.0 lb

## 2015-06-12 DIAGNOSIS — M545 Low back pain, unspecified: Secondary | ICD-10-CM

## 2015-06-12 MED ORDER — MELOXICAM 15 MG PO TABS: 15.0000 mg | ORAL_TABLET | Freq: Every day | ORAL | Status: DC

## 2015-06-12 NOTE — Patient Instructions (Signed)
Thank you for choosing [facility].  Summary/Instructions:  Follow up with Dr. Milinda Pointer as needed for your foot drop boot or check with Physical Therapy.  Continue physical therapy as needed.   Your prescription(s) have been submitted to your pharmacy or been printed and provided for you. Please take as directed and contact our office if you believe you are having problem(s) with the medication(s) or have any questions.  If your symptoms worsen or fail to improve, please contact our office for further instruction, or in case of emergency go directly to the emergency room at the closest medical facility.

## 2015-06-12 NOTE — Progress Notes (Signed)
Subjective:    Patient ID: Sheila [name], female    DOB: 09/19/1951, 64 y.o.   MRN: [id number]  Chief Complaint  Patient presents with  . Follow-up    wants a refill of meloxicam, doing PT and it is going well, is trying to do her PT at home bc she has a pool and due to copay    HPI:  Sheila [name] is a 64 y.o. female with a PMH of diabetes, hyperlipidemia, and low back pain who presents today for an office follow-up.  1.) Back pain - Has noted improvements in her back pain since starting physical therapy. Notes that she has been trying to work perform home exercises more secondary to the copay associated with PT and also the availability of some of the necessary equipment at home. Reports mild continued back pain with no radiculopathy. Currently managed with meloxicam and tramadol as needed.   Allergies  Allergen Reactions  . Sulfa Antibiotics   . Paxil [Paroxetine Hcl] Other (See Comments)    Dreams/ nightmares    Current Outpatient Prescriptions on File Prior to Visit  Medication Sig Dispense Refill  . aspirin 81 MG tablet Take 81 mg by mouth daily.    Marland Kitchen atorvastatin (LIPITOR) 20 MG tablet 1 tab by mouth daily 90 tablet 1  . Cholecalciferol (VITAMIN D3) 1000 UNITS CAPS Take 1 capsule by mouth daily.    . cyclobenzaprine (FLEXERIL) 10 MG tablet TAKE ONE TABLET BY MOUTH THREE TIMES DAILY AS NEEDED FOR MUSCLE SPASM 90 tablet 0  . fish oil-omega-3 fatty acids 1000 MG capsule Take 1 g by mouth daily.    Marland Kitchen glipiZIDE (GLUCOTROL) 10 MG tablet Take 1 tablet (10 mg total) by mouth daily before breakfast. 90 tablet 1  . ibuprofen (ADVIL,MOTRIN) 800 MG tablet Take 800 mg by mouth every 8 (eight) hours as needed.    Marland Kitchen LORazepam (ATIVAN) 0.5 MG tablet TAKE ONE TABLET BY MOUTH TWICE DAILY AS NEEDED FOR ANXIETY 90 tablet 0  . magnesium 30 MG tablet Take 30 mg by mouth daily.    . metFORMIN (GLUCOPHAGE) 500 MG tablet Take 2 tablets (1,000 mg total) by mouth daily. 180 tablet 1  . Multiple  Vitamin (MULTIVITAMIN) tablet Take 1 tablet by mouth daily.    . NONFORMULARY OR COMPOUNDED ITEM AFO for R leg for dropped foot Dx history of cva 1 each 0  . sertraline (ZOLOFT) 100 MG tablet Take 1 tablet (100 mg total) by mouth daily. 90 tablet 3  . traMADol (ULTRAM) 50 MG tablet TAKE ONE TABLET BY MOUTH EVERY 6 HOURS AS NEEDED FOR PAIN 90 tablet 0   No current facility-administered medications on file prior to visit.    Review of Systems  Musculoskeletal: Positive for back pain.  Neurological: Negative for numbness.      Objective:    BP 112/74 mmHg  Pulse 84  Temp(Src) 98 F (36.7 C) (Oral)  Resp 18  Ht 5' 2.75" (1.594 m)  Wt 172 lb (78.019 kg)  BMI 30.71 kg/m2  SpO2 97% Nursing note and vital signs reviewed.  Physical Exam  Constitutional: She is oriented to person, place, and time. She appears well-developed and well-nourished. No distress.  Cardiovascular: Normal rate, regular rhythm, normal heart sounds and intact distal pulses.   Pulmonary/Chest: Effort normal and breath sounds normal.  Musculoskeletal:  No obvious deformity, discoloration or edema of low back noted. Mild tenderness of lumbar paraspinal musculature. ROM is increased compared to previous. Does continue  to walk with a rollator.   Neurological: She is alert and oriented to person, place, and time.  Skin: Skin is warm and dry.  Psychiatric: She has a normal [name] and affect. Her behavior is normal. Judgment and thought content normal.       Assessment & Plan:   Problem List Items Addressed This Visit      Other   Low back pain potentially associated with radiculopathy - Primary    Improved low back pain since starting physical therapy. Continue current treatment plan with continued physical therapy as needed and home exercise program. Continue current dosages of meloxicam and tramadol as needed for pain. Follow-up if symptoms worsen or fail to improve.      Relevant Medications   meloxicam (MOBIC)  15 MG tablet

## 2015-06-12 NOTE — Progress Notes (Signed)
Pre visit review using our clinic review tool, if applicable. No additional management support is needed unless otherwise documented below in the visit note. 

## 2015-06-12 NOTE — Assessment & Plan Note (Signed)
Improved low back pain since starting physical therapy. Continue current treatment plan with continued physical therapy as needed and home exercise program. Continue current dosages of meloxicam and tramadol as needed for pain. Follow-up if symptoms worsen or fail to improve.

## 2015-06-13 ENCOUNTER — Telehealth: Payer: Self-pay | Admitting: Family

## 2015-06-13 DIAGNOSIS — E118 Type 2 diabetes mellitus with unspecified complications: Secondary | ICD-10-CM

## 2015-06-13 NOTE — Telephone Encounter (Signed)
Patient is requesting refill on flexeril to be sent to [facility].  Patient states she is out of med.

## 2015-06-16 MED ORDER — CYCLOBENZAPRINE HCL 10 MG PO TABS: ORAL_TABLET | ORAL | Status: DC

## 2015-06-16 MED ORDER — METFORMIN HCL 500 MG PO TABS: 1000.0000 mg | ORAL_TABLET | Freq: Every day | ORAL | Status: DC

## 2015-06-16 MED ORDER — GLIPIZIDE 10 MG PO TABS: 10.0000 mg | ORAL_TABLET | Freq: Every day | ORAL | Status: DC

## 2015-06-16 MED ORDER — ATORVASTATIN CALCIUM 20 MG PO TABS: ORAL_TABLET | ORAL | Status: DC

## 2015-06-16 NOTE — Telephone Encounter (Signed)
Was trying to refill medication a box popped that stated it is no longer on her formulary but came up with other options. Please advise on what else to send in.

## 2015-06-16 NOTE — Telephone Encounter (Signed)
Medications refilled

## 2015-07-07 ENCOUNTER — Telehealth: Payer: Self-pay | Admitting: Family

## 2015-07-07 DIAGNOSIS — M545 Low back pain: Secondary | ICD-10-CM | POA: Diagnosis not present

## 2015-07-07 DIAGNOSIS — M6281 Muscle weakness (generalized): Secondary | ICD-10-CM | POA: Diagnosis not present

## 2015-07-07 DIAGNOSIS — M62451 Contracture of muscle, right thigh: Secondary | ICD-10-CM | POA: Diagnosis not present

## 2015-07-07 DIAGNOSIS — M25551 Pain in right hip: Secondary | ICD-10-CM | POA: Diagnosis not present

## 2015-07-07 NOTE — Telephone Encounter (Signed)
Will from [facility] called regarding refill request for test scripts and some Lidocaine ointment. I don't see anything here for that  Can you please call pharmacy at [phone]

## 2015-07-08 NOTE — Telephone Encounter (Signed)
Papers for refill have been faxed over.

## 2015-07-22 ENCOUNTER — Ambulatory Visit (INDEPENDENT_AMBULATORY_CARE_PROVIDER_SITE_OTHER): Payer: Medicare Other | Admitting: Podiatry

## 2015-07-22 ENCOUNTER — Encounter: Payer: Self-pay | Admitting: Podiatry

## 2015-07-22 DIAGNOSIS — B351 Tinea unguium: Secondary | ICD-10-CM | POA: Diagnosis not present

## 2015-07-22 DIAGNOSIS — M79673 Pain in unspecified foot: Secondary | ICD-10-CM

## 2015-07-22 NOTE — Progress Notes (Signed)
Patient ID: Sheila [name], female   DOB: Dec 08, 1951, 64 y.o.   MRN: [id number] Complaint:  Visit Type: Patient returns to my office for continued preventative foot care services. Complaint: Patient states" my nails have grown long and thick and become painful to walk and wear shoes" Patient has been diagnosed with DM with no foot complications. The patient presents for preventative foot care services. No changes to ROS  Podiatric Exam: Vascular: dorsalis pedis and posterior tibial pulses are palpable bilateral. Capillary return is immediate. Temperature gradient is WNL. Skin turgor WNL  Sensorium: Normal Semmes Weinstein monofilament test. Normal tactile sensation bilaterally. Nail Exam: Pt has thick disfigured discolored nails with subungual debris noted bilateral entire nail hallux through fifth toenails Ulcer Exam: There is no evidence of ulcer or pre-ulcerative changes or infection. Orthopedic Exam: Muscle tone and strength are WNL. No limitations in general ROM. No crepitus or effusions noted. Foot type and digits show no abnormalities. Bony prominences are unremarkable. Skin: No Porokeratosis. No infection or ulcers  Diagnosis:  Onychomycosis, , Pain in right toe, pain in left toes  Treatment & Plan Procedures and Treatment: Consent by patient was obtained for treatment procedures. The patient understood the discussion of treatment and procedures well. All questions were answered thoroughly reviewed. Debridement of mycotic and hypertrophic toenails, 1 through 5 bilateral and clearing of subungual debris. No ulceration, no infection noted.  Patient questioned me about receiving an AFO brace.  Her muscle strength is 4/4 muscle strength left foot.  3/4 muscle strength right foot.  Told her I do not believe she needs the AFO brace. Return Visit-Office Procedure: Patient instructed to return to the office for a follow up visit 3 months for continued evaluation and treatment.

## 2015-08-07 ENCOUNTER — Other Ambulatory Visit: Payer: Self-pay

## 2015-08-07 MED ORDER — ACCU-CHEK AVIVA PLUS W/DEVICE KIT: PACK | Status: DC

## 2015-08-28 ENCOUNTER — Other Ambulatory Visit: Payer: Self-pay | Admitting: Family Medicine

## 2015-08-28 ENCOUNTER — Other Ambulatory Visit: Payer: Self-pay | Admitting: Family

## 2015-08-29 ENCOUNTER — Telehealth: Payer: Self-pay | Admitting: Family

## 2015-08-29 MED ORDER — GLUCOSE BLOOD VI STRP: 1.0000 | ORAL_STRIP | Freq: Two times a day (BID) | Status: DC

## 2015-08-29 MED ORDER — ACCU-CHEK SOFTCLIX LANCETS MISC: 1.0000 | Freq: Two times a day (BID) | Status: DC

## 2015-08-29 NOTE — Telephone Encounter (Signed)
Sent rx for supplies to [facility]...Johny Chess

## 2015-08-29 NOTE — Telephone Encounter (Signed)
Patient states she picked up RX for Accu-Chek but had no test strips.  She needs test strips too.  Please send RX to pharmacy.

## 2015-09-02 ENCOUNTER — Other Ambulatory Visit: Payer: Self-pay

## 2015-09-02 MED ORDER — TRAMADOL HCL 50 MG PO TABS: ORAL_TABLET | ORAL | Status: DC

## 2015-09-02 MED ORDER — LORAZEPAM 0.5 MG PO TABS: ORAL_TABLET | ORAL | Status: DC

## 2015-09-02 NOTE — Telephone Encounter (Signed)
[facility] is requesting refill for Ultram and Ativan for pt. Please advise

## 2015-09-05 ENCOUNTER — Encounter: Payer: Self-pay | Admitting: Family

## 2015-09-05 ENCOUNTER — Ambulatory Visit (INDEPENDENT_AMBULATORY_CARE_PROVIDER_SITE_OTHER): Payer: Medicare Other | Admitting: Family

## 2015-09-05 ENCOUNTER — Other Ambulatory Visit (INDEPENDENT_AMBULATORY_CARE_PROVIDER_SITE_OTHER): Payer: Medicare Other

## 2015-09-05 VITALS — BP 120/64 | HR 92 | Temp 98.3°F | Resp 18 | Ht 62.75 in | Wt 175.0 lb

## 2015-09-05 DIAGNOSIS — G629 Polyneuropathy, unspecified: Secondary | ICD-10-CM

## 2015-09-05 DIAGNOSIS — Z79899 Other long term (current) drug therapy: Secondary | ICD-10-CM | POA: Diagnosis not present

## 2015-09-05 DIAGNOSIS — E119 Type 2 diabetes mellitus without complications: Secondary | ICD-10-CM | POA: Diagnosis not present

## 2015-09-05 LAB — HEMOGLOBIN A1C: HEMOGLOBIN A1C: 6.5 % (ref 4.6–6.5)

## 2015-09-05 LAB — CBC
HEMATOCRIT: 39.3 % (ref 36.0–46.0)
HEMOGLOBIN: 13.5 g/dL (ref 12.0–15.0)
MCHC: 34.3 g/dL (ref 30.0–36.0)
MCV: 90.5 fl (ref 78.0–100.0)
PLATELETS: 302 10*3/uL (ref 150.0–400.0)
RBC: 4.34 Mil/uL (ref 3.87–5.11)
RDW: 13.6 % (ref 11.5–15.5)
WBC: 8.7 10*3/uL (ref 4.0–10.5)

## 2015-09-05 LAB — VITAMIN B12: VITAMIN B 12: 360 pg/mL (ref 211–911)

## 2015-09-05 MED ORDER — UNISTIK 2 COMFORT MISC: Status: DC

## 2015-09-05 NOTE — Progress Notes (Signed)
Subjective:    Patient ID: Sheila [name], female    DOB: April 01, 1951, 64 y.o.   MRN: [id number]  Chief Complaint  Patient presents with  . Numbness    bottom of both feet feel numb and that feeling comes and goes, x2 weeks noticed it while doing water exercises    HPI:  Sheila [name] is a 64 y.o. female with a PMH of arthritis, type 2 diabetes, hyperlipidemia, seizures, and stroke who presents today for an office follow-up.  1.) Neuropathy - associated symptom of numbness located in the bottom of her bilateral feet has been waxing and waning for approximately 2 week after starting going back to the [facility] for exercise. Denies any modifying factors or treatments that make it better or worse. Treated for diabetes and maintained on metformin and glipizide. Takes the medications as prescribed and denies adverse side effects. Blood sugars in the morning tend to be high because she eats later in the night.   Lab Results  Component Value Date   HGBA1C 6.5 09/05/2015     Allergies  Allergen Reactions  . Sulfa Antibiotics   . Paxil [Paroxetine Hcl] Other (See Comments)    Dreams/ nightmares    Current Outpatient Prescriptions on File Prior to Visit  Medication Sig Dispense Refill  . aspirin 81 MG tablet Take 81 mg by mouth daily.    Marland Kitchen atorvastatin (LIPITOR) 20 MG tablet Take 1 tablet by mouth  daily 90 tablet 0  . Blood Glucose Monitoring Suppl (ACCU-CHEK AVIVA PLUS) W/DEVICE KIT Use as directed once daily.----DX: E11.9 1 kit 0  . Cholecalciferol (VITAMIN D3) 1000 UNITS CAPS Take 1 capsule by mouth daily.    . cyclobenzaprine (FLEXERIL) 10 MG tablet TAKE ONE TABLET BY MOUTH  THREE TIMES DAILY AS NEEDED FOR MUSCLE SPASM 90 tablet 0  . fish oil-omega-3 fatty acids 1000 MG capsule Take 1 g by mouth daily.    Marland Kitchen glipiZIDE (GLUCOTROL) 10 MG tablet Take 1 tablet by mouth  daily before breakfast 90 tablet 1  . glucose blood (ACCU-CHEK AVIVA PLUS) test strip 1 each by Other route 2 (two)  times daily. Use to check blood sugars twice a day Dx E11.9 100 each 3  . ibuprofen (ADVIL,MOTRIN) 800 MG tablet Take 800 mg by mouth every 8 (eight) hours as needed.    Marland Kitchen LORazepam (ATIVAN) 0.5 MG tablet TAKE ONE TABLET BY MOUTH TWICE DAILY AS NEEDED FOR ANXIETY 90 tablet 0  . magnesium 30 MG tablet Take 30 mg by mouth daily.    . meloxicam (MOBIC) 15 MG tablet Take 1 tablet by mouth  daily 90 tablet 0  . metFORMIN (GLUCOPHAGE) 500 MG tablet Take 2 tablets by mouth  daily 180 tablet 0  . Multiple Vitamin (MULTIVITAMIN) tablet Take 1 tablet by mouth daily.    . NONFORMULARY OR COMPOUNDED ITEM AFO for R leg for dropped foot Dx history of cva 1 each 0  . sertraline (ZOLOFT) 100 MG tablet Take 1 tablet (100 mg total) by mouth daily. 90 tablet 3  . traMADol (ULTRAM) 50 MG tablet TAKE ONE TABLET BY MOUTH EVERY 6 HOURS AS NEEDED FOR PAIN 90 tablet 0   No current facility-administered medications on file prior to visit.    Review of Systems  Constitutional: Negative for fever and chills.  Eyes:       Denies changes in vision  Respiratory: Negative for chest tightness and shortness of breath.   Cardiovascular: Negative for chest  pain, palpitations and leg swelling.  Endocrine: Negative for polydipsia, polyphagia and polyuria.  Neurological: Positive for numbness.      Objective:    BP 120/64 mmHg  Pulse 92  Temp(Src) 98.3 F (36.8 C) (Oral)  Resp 18  Ht 5' 2.75" (1.594 m)  Wt 175 lb (79.379 kg)  BMI 31.24 kg/m2  SpO2 99% Nursing note and vital signs reviewed.  Physical Exam  Constitutional: She is oriented to person, place, and time. She appears well-developed and well-nourished. No distress.  Cardiovascular: Normal rate, regular rhythm, normal heart sounds and intact distal pulses.   Pulmonary/Chest: Effort normal and breath sounds normal.  Neurological: She is alert and oriented to person, place, and time.  No obvious deformity, discoloration or edema of lower extremities noted.  Decreased sensation bilaterally to monofilament throughout exam. Pulses are intact and appropriate.   Skin: Skin is warm and dry.  Psychiatric: She has a normal mood and affect. Her behavior is normal. Judgment and thought content normal.       Assessment & Plan:   Problem List Items Addressed This Visit      Nervous and Auditory   Neuropathy - Primary    Symptoms and exam consistent with neuropathy of undetermined origin. Last A1c done in October of last year was 5.4. Obtain new A1c. Obtain B12 and CBC to rule out other metabolic causes. Continue to monitor at this time. May require referral to neurology. Follow up pending lab work.       Relevant Orders   Hemoglobin A1c (Completed)   B12 (Completed)   CBC (Completed)

## 2015-09-05 NOTE — Assessment & Plan Note (Signed)
Symptoms and exam consistent with neuropathy of undetermined origin. Last A1c done in October of last year was 5.4. Obtain new A1c. Obtain B12 and CBC to rule out other metabolic causes. Continue to monitor at this time. May require referral to neurology. Follow up pending lab work.

## 2015-09-05 NOTE — Patient Instructions (Signed)
Thank you for choosing [facility].  Summary/Instructions:   Please stop by the lab on the basement level of the building for your blood work. Your results will be released to MyChart (or called to you) after review, usually within 72 hours after test completion. If any changes need to be made, you will be notified at that same time.  If your symptoms worsen or fail to improve, please contact our office for further instruction, or in case of emergency go directly to the emergency room at the closest medical facility.    Peripheral Neuropathy Peripheral neuropathy is a type of nerve damage. It affects nerves that carry signals between the spinal cord and other parts of the body. These are called peripheral nerves. With peripheral neuropathy, one nerve or a group of nerves may be damaged.  CAUSES  Many things can damage peripheral nerves. For some people with peripheral neuropathy, the cause is unknown. Some causes include:  Diabetes. This is the most common cause of peripheral neuropathy.  Injury to a nerve.  Pressure or stress on a nerve that lasts a long time.  Too little vitamin B. Alcoholism can lead to this.  Infections.  Autoimmune diseases, such as multiple sclerosis and systemic lupus erythematosus.  Inherited nerve diseases.  Some medicines, such as cancer drugs.  Toxic substances, such as lead and mercury.  Too little blood flowing to the legs.  Kidney disease.  Thyroid disease. SIGNS AND SYMPTOMS  Different people have different symptoms. The symptoms you have will depend on which of your nerves is damaged. Common symptoms include:  Loss of feeling (numbness) in the feet and hands.  Tingling in the feet and hands.  Pain that burns.  Very sensitive skin.  Weakness.  Not being able to move a part of the body (paralysis).  Muscle twitching.  Clumsiness or poor coordination.  Loss of balance.  Not being able to control your bladder.  Feeling  dizzy.  Sexual problems. DIAGNOSIS  Peripheral neuropathy is a symptom, not a disease. Finding the cause of peripheral neuropathy can be hard. To figure that out, your health care provider will take a medical history and do a physical exam. A neurological exam will also be done. This involves checking things affected by your brain, spinal cord, and nerves (nervous system). For example, your health care provider will check your reflexes, how you move, and what you can feel.  Other types of tests may also be ordered, such as:  Blood tests.  A test of the fluid in your spinal cord.  Imaging tests, such as CT scans or an MRI.  Electromyography (EMG). This test checks the nerves that control muscles.  Nerve conduction velocity tests. These tests check how fast messages pass through your nerves.  Nerve biopsy. A small piece of nerve is removed. It is then checked under a microscope. TREATMENT   Medicine is often used to treat peripheral neuropathy. Medicines may include:  Pain-relieving medicines. Prescription or over-the-counter medicine may be suggested.  Antiseizure medicine. This may be used for pain.  Antidepressants. These also may help ease pain from neuropathy.  Lidocaine. This is a numbing medicine. You might wear a patch or be given a shot.  Mexiletine. This medicine is typically used to help control irregular heart rhythms.  Surgery. Surgery may be needed to relieve pressure on a nerve or to destroy a nerve that is causing pain.  Physical therapy to help movement.  Assistive devices to help movement. HOME CARE INSTRUCTIONS     Only take over-the-counter or prescription medicines as directed by your health care provider. Follow the instructions carefully for any given medicines. Do not take any other medicines without first getting approval from your health care provider.  If you have diabetes, work closely with your health care provider to keep your blood sugar under  control.  If you have numbness in your feet:  Check every day for signs of injury or infection. Watch for redness, warmth, and swelling.  Wear padded socks and comfortable shoes. These help protect your feet.  Do not do things that put pressure on your damaged nerve.  Do not smoke. Smoking keeps blood from getting to damaged nerves.  Avoid or limit alcohol. Too much alcohol can cause a lack of B vitamins. These vitamins are needed for healthy nerves.  Develop a good support system. Coping with peripheral neuropathy can be stressful. Talk to a mental health specialist or join a support group if you are struggling.  Follow up with your health care provider as directed. SEEK MEDICAL CARE IF:   You have new signs or symptoms of peripheral neuropathy.  You are struggling emotionally from dealing with peripheral neuropathy.  You have a fever. SEEK IMMEDIATE MEDICAL CARE IF:   You have an injury or infection that is not healing.  You feel very dizzy or begin vomiting.  You have chest pain.  You have trouble breathing. Document Released: 12/03/2002 Document Revised: 08/25/2011 Document Reviewed: 08/20/2013 ExitCare Patient Information 2015 [employer]. This information is not intended to replace advice given to you by your health care provider. Make sure you discuss any questions you have with your health care provider.  

## 2015-09-05 NOTE — Progress Notes (Signed)
Pre visit review using our clinic review tool, if applicable. No additional management support is needed unless otherwise documented below in the visit note. 

## 2015-09-07 ENCOUNTER — Telehealth: Payer: Self-pay | Admitting: Family

## 2015-09-07 NOTE — Telephone Encounter (Signed)
Please inform patient that her A1c has increased to 6.5 which is still considered adequate control of her diabetes, however it could be the underlying cause of the numbness she is experiencing in her feet.Therefore, please have her watch the amount of carbohydrates that she is eating.  If she continues to have issues or if pain develops, please follow up.

## 2015-09-09 NOTE — Telephone Encounter (Signed)
Pt aware of results 

## 2015-09-24 ENCOUNTER — Telehealth: Payer: Self-pay | Admitting: Family

## 2015-09-24 NOTE — Telephone Encounter (Signed)
I do not have that listed on her medication list. Please find out more details. Is this a patch?

## 2015-09-24 NOTE — Telephone Encounter (Signed)
Is this ok ? Please advise

## 2015-09-24 NOTE — Telephone Encounter (Signed)
They are requesting verbals for a new script of lidocaine 240 MG. Please call.

## 2015-09-25 NOTE — Telephone Encounter (Signed)
Tried calling the pharmacy back. No answer will try back later.

## 2015-09-25 NOTE — Telephone Encounter (Signed)
They also advised that they would be faxing a request for this new RX>

## 2015-09-25 NOTE — Telephone Encounter (Signed)
Called them back. They are saying pt is requesting lidocaine 240 mg for arthritis pain. They are not requesting patches but stated if you think it is safer then they can send patches instead. Please advise.

## 2015-09-25 NOTE — Telephone Encounter (Signed)
Will complete RX when received.

## 2015-09-26 NOTE — Telephone Encounter (Signed)
Received orders from pharmacy. Faxed it back with [facility] for permission to fill medication.

## 2015-10-30 ENCOUNTER — Telehealth: Payer: Self-pay | Admitting: Family

## 2015-10-30 NOTE — Telephone Encounter (Signed)
[facility] called regarding refill requests they have been faxing over for patients diabetic supplies. He will be faxing this request again.

## 2015-11-04 ENCOUNTER — Telehealth: Payer: Self-pay | Admitting: *Deleted

## 2015-11-04 ENCOUNTER — Ambulatory Visit (INDEPENDENT_AMBULATORY_CARE_PROVIDER_SITE_OTHER): Payer: Medicare Other | Admitting: Podiatry

## 2015-11-04 ENCOUNTER — Encounter: Payer: Self-pay | Admitting: Podiatry

## 2015-11-04 DIAGNOSIS — E11610 Type 2 diabetes mellitus with diabetic neuropathic arthropathy: Secondary | ICD-10-CM

## 2015-11-04 DIAGNOSIS — G629 Polyneuropathy, unspecified: Secondary | ICD-10-CM

## 2015-11-04 DIAGNOSIS — B351 Tinea unguium: Secondary | ICD-10-CM | POA: Diagnosis not present

## 2015-11-04 DIAGNOSIS — M79676 Pain in unspecified toe(s): Secondary | ICD-10-CM

## 2015-11-04 NOTE — Progress Notes (Signed)
Patient ID: Sheila [name], female   DOB: 1951-04-16, 64 y.o.   MRN: [id number]  Subjective: 64 y.o. returns the office today for painful, elongated, thickened toenails which she is unable to trim herself. Denies any redness or drainage around the nails. She also states that she gets some occasional tingling and numbness to her feet. She denies any claudication symptoms. She's had no previous treatment for this. Says been ongoing for greater than 1 year. Denies any acute changes since last appointment and no new complaints today. Denies any systemic complaints such as fevers, chills, nausea, vomiting.   Objective: AAO 3, NAD DP/PT pulses palpable 1/4, CRT less than 3 seconds Protective sensation somewhat decreased with Simms Weinstein monofilament, Achilles tendon reflex intact.  Nails hypertrophic, dystrophic, elongated, brittle, discolored 10. There is tenderness overlying the nails 1-5 bilaterally. There is no surrounding erythema or drainage along the nail sites. No open lesions or pre-ulcerative lesions are identified. No other areas of tenderness bilateral lower extremities. No overlying edema, erythema, increased warmth. No pain with calf compression, swelling, warmth, erythema.  Assessment: Patient presents with symptomatic onychomycosis  Plan: -Treatment options including alternatives, risks, complications were discussed -Nails sharply debrided 10 without complication/bleeding. -Discussed etiology tingling or numbness. Could be from likely neuropathy however given decreased pulses will check arterial studies to rule out vascular insufficiency. This is ordered today. -Discussed daily foot inspection. If there are any changes, to call the office immediately.  -Follow-up in 3 months or sooner if any problems are to arise. In the meantime, encouraged to call the office with any questions, concerns, changes symptoms.  Celesta Gentile, DPM

## 2015-11-04 NOTE — Telephone Encounter (Signed)
Orders and insurance faxed.

## 2015-11-04 NOTE — Addendum Note (Signed)
Addended by: Harriett Sine D on: 11/04/2015 01:49 PM   Modules accepted: Orders

## 2015-11-06 NOTE — Telephone Encounter (Signed)
We have not received anything related to her diabetes.

## 2015-11-17 ENCOUNTER — Other Ambulatory Visit: Payer: Self-pay | Admitting: Podiatry

## 2015-11-17 ENCOUNTER — Other Ambulatory Visit: Payer: Self-pay | Admitting: Family

## 2015-11-17 ENCOUNTER — Telehealth: Payer: Self-pay | Admitting: *Deleted

## 2015-11-17 DIAGNOSIS — R200 Anesthesia of skin: Secondary | ICD-10-CM

## 2015-11-17 DIAGNOSIS — I96 Gangrene, not elsewhere classified: Secondary | ICD-10-CM

## 2015-11-17 MED ORDER — ACCU-CHEK FASTCLIX LANCETS MISC: Status: DC

## 2015-11-17 MED ORDER — GLUCOSE BLOOD VI STRP: 1.0000 | ORAL_STRIP | Freq: Two times a day (BID) | Status: DC

## 2015-11-17 NOTE — Telephone Encounter (Signed)
Received call pt states she is needing BS monitor. Verified what type of meter. Inform ot per chart Marya Amsler assistant has sent a prescription this am for accu-chek meter. i will go ahead and send strips & lancets to [facility] as well...Johny Chess

## 2015-11-19 ENCOUNTER — Ambulatory Visit (HOSPITAL_COMMUNITY)
Admission: RE | Admit: 2015-11-19 | Discharge: 2015-11-19 | Disposition: A | Discharge status: Home / Self Care | Payer: Medicare Other | Source: Ambulatory Visit | Attending: Podiatry | Admitting: Podiatry

## 2015-11-19 DIAGNOSIS — R200 Anesthesia of skin: Secondary | ICD-10-CM | POA: Diagnosis not present

## 2015-11-19 DIAGNOSIS — E11610 Type 2 diabetes mellitus with diabetic neuropathic arthropathy: Secondary | ICD-10-CM

## 2015-11-19 DIAGNOSIS — M79676 Pain in unspecified toe(s): Secondary | ICD-10-CM

## 2015-11-19 DIAGNOSIS — G629 Polyneuropathy, unspecified: Secondary | ICD-10-CM | POA: Diagnosis not present

## 2015-11-24 ENCOUNTER — Telehealth: Payer: Self-pay | Admitting: Family

## 2015-11-24 NOTE — Telephone Encounter (Signed)
[facility] has faxed a refill request for pts diabetic testing supplies.  They will be faxing another request over Their phone [phone] Fax [phone]

## 2015-12-03 ENCOUNTER — Encounter: Payer: Self-pay | Admitting: Cardiovascular Disease

## 2015-12-03 ENCOUNTER — Ambulatory Visit (INDEPENDENT_AMBULATORY_CARE_PROVIDER_SITE_OTHER): Payer: Medicare Other | Admitting: Cardiovascular Disease

## 2015-12-03 VITALS — BP 130/82 | HR 90 | Ht 62.0 in | Wt 175.0 lb

## 2015-12-03 DIAGNOSIS — I739 Peripheral vascular disease, unspecified: Secondary | ICD-10-CM | POA: Diagnosis not present

## 2015-12-03 DIAGNOSIS — E785 Hyperlipidemia, unspecified: Secondary | ICD-10-CM | POA: Diagnosis not present

## 2015-12-03 NOTE — Progress Notes (Signed)
/     12/03/2015 Sheila [name]   08/02/1951  [id number]  Primary Physician Mauricio Po, [place] Primary Cardiologist: Lorretta Harp MD Renae Gloss   HPI:  Sheila [name] is a 64 year old mildly overweight widowed African-American female mother of 4 children, grandmother and 2 grandchildren referred by Dr. Jacqualyn Posey, her podiatrist, for peripheral vascular evaluation. Her cardiovascular risk factor profile is notable for 40-pack-years of tobacco use having cut down 5 years ago and now smoking 3 cigarettes a day. She has treated diet of diabetes. She has never had a heart attack but has had a stroke remotely. She denies chest pain or shortness of breath. She denies claudication. She saw Dr. Jacqualyn Posey, podiatry, for evaluation of lower extremity numbness. Lobectomy Dopplers performed in our office 11/19/15 revealed normal ABIs bilaterally with occluded peroneal arteries.   Current Outpatient Prescriptions  Medication Sig Dispense Refill  . ACCU-CHEK FASTCLIX LANCETS MISC Use to help check blood sugars twice a day Dx E11.9 102 each 3  . aspirin 81 MG tablet Take 81 mg by mouth daily.    Marland Kitchen atorvastatin (LIPITOR) 20 MG tablet Take 1 tablet by mouth  daily 90 tablet 0  . Blood Glucose Monitoring Suppl (ACCU-CHEK AVIVA PLUS) W/DEVICE KIT Use as directed once daily 1 kit 1  . cyclobenzaprine (FLEXERIL) 10 MG tablet TAKE ONE TABLET BY MOUTH  THREE TIMES DAILY AS NEEDED FOR MUSCLE SPASM 90 tablet 0  . fish oil-omega-3 fatty acids 1000 MG capsule Take 1 g by mouth daily.    Marland Kitchen glipiZIDE (GLUCOTROL) 10 MG tablet Take 1 tablet by mouth  daily before breakfast 90 tablet 1  . glucose blood (ACCU-CHEK AVIVA PLUS) test strip 1 each by Other route 2 (two) times daily. Use to check blood sugars twice a day Dx E11.9 100 each 3  . ibuprofen (ADVIL,MOTRIN) 800 MG tablet Take 800 mg by mouth every 8 (eight) hours as needed.    . Lancets Misc. (UNISTIK 2 COMFORT) MISC Use 1 device to check blood sugar 1-2  times daily. Use new device for each test. 200 each 0  . LORazepam (ATIVAN) 0.5 MG tablet TAKE ONE TABLET BY MOUTH TWICE DAILY AS NEEDED FOR ANXIETY 90 tablet 0  . magnesium 30 MG tablet Take 30 mg by mouth daily.    . meloxicam (MOBIC) 15 MG tablet Take 1 tablet by mouth  daily 90 tablet 0  . metFORMIN (GLUCOPHAGE) 500 MG tablet Take 2 tablets by mouth  daily 180 tablet 0  . Multiple Vitamin (MULTIVITAMIN) tablet Take 1 tablet by mouth daily.    . NONFORMULARY OR COMPOUNDED ITEM AFO for R leg for dropped foot Dx history of cva 1 each 0  . sertraline (ZOLOFT) 100 MG tablet Take 1 tablet (100 mg total) by mouth daily. 90 tablet 3  . traMADol (ULTRAM) 50 MG tablet TAKE ONE TABLET BY MOUTH EVERY 6 HOURS AS NEEDED FOR PAIN 90 tablet 0   No current facility-administered medications for this visit.    Allergies  Allergen Reactions  . Sulfa Antibiotics   . Paxil [Paroxetine Hcl] Other (See Comments)    Dreams/ nightmares    Social History   Social History  . Marital Status: Widowed    Spouse Name: N/A  . Number of Children: 3  . Years of Education: 16   Occupational History  . 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 Smoking status: Former Smoker    Types: Cigarettes  Quit date: 02/15/1992  . Smokeless tobacco: Never Used  . Alcohol Use: No  . Drug Use: No     Comment: SMOKES IT ANY TIME SHE CAN GET IT  . Sexual Activity:    Partners: Male   Other Topics Concern  . Not on file   Social History Narrative   Fun: Talk, church activities    Denies any religious beliefs effecting health care.      Review of Systems: General: negative for chills, fever, night sweats or weight changes.  Cardiovascular: negative for chest pain, dyspnea on exertion, edema, orthopnea, palpitations, paroxysmal nocturnal dyspnea or shortness of breath Dermatological: negative for rash Respiratory: negative for cough or wheezing Urologic: negative for hematuria Abdominal: negative for  nausea, vomiting, diarrhea, bright red blood per rectum, melena, or hematemesis Neurologic: negative for visual changes, syncope, or dizziness All other systems reviewed and are otherwise negative except as noted above.    Blood pressure 130/82, pulse 90, height 5' 2" (1.575 m), weight 175 lb (79.379 kg).  General appearance: alert and no distress Neck: no adenopathy, no carotid bruit, no JVD, supple, symmetrical, trachea midline and thyroid not enlarged, symmetric, no tenderness/mass/nodules Lungs: clear to auscultation bilaterally Heart: regular rate and rhythm, S1, S2 normal, no murmur, click, rub or gallop Extremities: extremities normal, atraumatic, no cyanosis or edema  EKG normal sinus rhythm at 90 without ST or T-wave changes. I personally reviewed this EKG  ASSESSMENT AND PLAN:   Peripheral arterial disease [facility]) Sheila [name] was referred by Dr. Earleen Newport for evaluation of peripheral arterial disease. Major complaints are of lower extremity numbness. She denies claudication. Her lower extremity arterial Dopplers performed 11/19/15 revealed normal ABIs bilaterally with occluded peroneal arteries. I have reassured her that her symptoms are not vascular nature but more likely neuro neurologic. I will see her back when necessary      Lorretta Harp MD [facility], [facility] 12/03/2015 10:35 AM

## 2015-12-03 NOTE — Patient Instructions (Signed)
Return as needed

## 2015-12-03 NOTE — Assessment & Plan Note (Signed)
Sheila [name] was referred by Dr. Earleen Newport for evaluation of peripheral arterial disease. Major complaints are of lower extremity numbness. She denies claudication. Her lower extremity arterial Dopplers performed 11/19/15 revealed normal ABIs bilaterally with occluded peroneal arteries. I have reassured her that her symptoms are not vascular nature but more likely neuro neurologic. I will see her back when necessary

## 2015-12-09 ENCOUNTER — Telehealth: Payer: Self-pay

## 2015-12-09 ENCOUNTER — Ambulatory Visit (INDEPENDENT_AMBULATORY_CARE_PROVIDER_SITE_OTHER): Payer: Medicare Other | Admitting: Family

## 2015-12-09 ENCOUNTER — Encounter: Payer: Self-pay | Admitting: Family

## 2015-12-09 VITALS — BP 122/70 | HR 93 | Temp 98.0°F | Resp 16 | Ht 62.0 in | Wt 171.0 lb

## 2015-12-09 DIAGNOSIS — G629 Polyneuropathy, unspecified: Secondary | ICD-10-CM | POA: Diagnosis not present

## 2015-12-09 MED ORDER — GABAPENTIN 300 MG PO CAPS: 300.0000 mg | ORAL_CAPSULE | Freq: Every day | ORAL | Status: DC

## 2015-12-09 NOTE — Progress Notes (Signed)
Subjective:    Patient ID: Sheila [name], female    DOB: 04-10-1951, 64 y.o.   MRN: [id number]  Chief Complaint  Patient presents with  . Numbness    wants to talk about the numbness in her feet and her glucometer    HPI:  Sheila [name] is a 64 y.o. female who  has a past medical history of Arthritis; Diabetes mellitus; Stroke ([place]); Seizures ([place]); and Hyperlipidemia. and presents today for a follow up office visit.   Continues to experience the associated symptom of neuropathy located in her bilateral feet have been going on for several months. Her last A1c was 6.5. She was also seen by podiatry with concern for potential vascular causes with the results of her ABIs bilaterally being normal and occluded peroneal arteries and cardiology notes that her symptoms are not vascular.  Describes the sensation as numbness with no pain and feels like they have been cold and then getting warm.   Lab Results  Component Value Date   HGBA1C 6.5 09/05/2015    Allergies  Allergen Reactions  . Sulfa Antibiotics   . Paxil [Paroxetine Hcl] Other (See Comments)    Dreams/ nightmares     Current Outpatient Prescriptions on File Prior to Visit  Medication Sig Dispense Refill  . ACCU-CHEK FASTCLIX LANCETS MISC Use to help check blood sugars twice a day Dx E11.9 102 each 3  . aspirin 81 MG tablet Take 81 mg by mouth daily.    Marland Kitchen atorvastatin (LIPITOR) 20 MG tablet Take 1 tablet by mouth  daily 90 tablet 0  . Blood Glucose Monitoring Suppl (ACCU-CHEK AVIVA PLUS) W/DEVICE KIT Use as directed once daily 1 kit 1  . cyclobenzaprine (FLEXERIL) 10 MG tablet TAKE ONE TABLET BY MOUTH  THREE TIMES DAILY AS NEEDED FOR MUSCLE SPASM 90 tablet 0  . fish oil-omega-3 fatty acids 1000 MG capsule Take 1 g by mouth daily.    Marland Kitchen glipiZIDE (GLUCOTROL) 10 MG tablet Take 1 tablet by mouth  daily before breakfast 90 tablet 1  . glucose blood (ACCU-CHEK AVIVA PLUS) test strip 1 each by Other route 2 (two) times  daily. Use to check blood sugars twice a day Dx E11.9 100 each 3  . ibuprofen (ADVIL,MOTRIN) 800 MG tablet Take 800 mg by mouth every 8 (eight) hours as needed.    . Lancets Misc. (UNISTIK 2 COMFORT) MISC Use 1 device to check blood sugar 1-2 times daily. Use new device for each test. 200 each 0  . LORazepam (ATIVAN) 0.5 MG tablet TAKE ONE TABLET BY MOUTH TWICE DAILY AS NEEDED FOR ANXIETY 90 tablet 0  . magnesium 30 MG tablet Take 30 mg by mouth daily.    . meloxicam (MOBIC) 15 MG tablet Take 1 tablet by mouth  daily 90 tablet 0  . metFORMIN (GLUCOPHAGE) 500 MG tablet Take 2 tablets by mouth  daily 180 tablet 0  . Multiple Vitamin (MULTIVITAMIN) tablet Take 1 tablet by mouth daily.    . NONFORMULARY OR COMPOUNDED ITEM AFO for R leg for dropped foot Dx history of cva 1 each 0  . sertraline (ZOLOFT) 100 MG tablet Take 1 tablet (100 mg total) by mouth daily. 90 tablet 3  . traMADol (ULTRAM) 50 MG tablet TAKE ONE TABLET BY MOUTH EVERY 6 HOURS AS NEEDED FOR PAIN 90 tablet 0   No current facility-administered medications on file prior to visit.    Review of Systems  Constitutional: Negative for fever and  chills.  Endocrine: Negative for polydipsia, polyphagia and polyuria.  Neurological: Positive for numbness.      Objective:    BP 122/70 mmHg  Pulse 93  Temp(Src) 98 F (36.7 C) (Oral)  Resp 16  Ht '5\' 2"'  (1.575 m)  Wt 171 lb (77.565 kg)  BMI 31.27 kg/m2  SpO2 98% Nursing note and vital signs reviewed.  Physical Exam  Constitutional: She is oriented to person, place, and time. She appears well-developed and well-nourished. No distress.  Cardiovascular: Normal rate, regular rhythm, normal heart sounds and intact distal pulses.   Pulmonary/Chest: Effort normal and breath sounds normal.  Musculoskeletal:  Bilateral feet - no obvious deformity, discoloration or edema noted. Dorsalis pedis and posterior tibial pulses are 1+. Capillary refill is intact and appropriate. Generalized  decreased sensation with monofilament.   Neurological: She is alert and oriented to person, place, and time.  Skin: Skin is warm and dry.  Psychiatric: She has a normal mood and affect. Her behavior is normal. Judgment and thought content normal.       Assessment & Plan:   Problem List Items Addressed This Visit      Nervous and Auditory   Neuropathy ([place]) - Primary    Symptoms of neuropathy of non-vascular pathology. Cannot rule out progression of diabetes. Previous B12 normal. Trial gabapentin. Refer to neurology for further assessment. Follow up pending referral or sooner if needed.       Relevant Medications   gabapentin (NEURONTIN) 300 MG capsule   Other Relevant Orders   Ambulatory referral to Neurology

## 2015-12-09 NOTE — Assessment & Plan Note (Signed)
Symptoms of neuropathy of non-vascular pathology. Cannot rule out progression of diabetes. Previous B12 normal. Trial gabapentin. Refer to neurology for further assessment. Follow up pending referral or sooner if needed.

## 2015-12-09 NOTE — Telephone Encounter (Signed)
Outreach per Terri Piedra to fup on diabetes and to set up AWV;  Will check with the patient but appears to be 2nd year on Medicare or more. Mrs. Sheila [name] answered and was on the phone. Plan to call back after 3 pm today.

## 2015-12-09 NOTE — Progress Notes (Signed)
Pre visit review using our clinic review tool, if applicable. No additional management support is needed unless otherwise documented below in the visit note. 

## 2015-12-09 NOTE — Telephone Encounter (Signed)
Call to Mrs Domina and was busy but did want to schedule apt; Left my direct number and did not have time to discuss AWV, but will schedule AWV with focus on DM Requested she call direct number and leave VM if I do not answer with 2 apt times she is available and I will call her back .

## 2015-12-09 NOTE — Patient Instructions (Signed)
Thank you for choosing [facility].  Summary/Instructions:  Your prescription(s) have been submitted to your pharmacy or been printed and provided for you. Please take as directed and contact our office if you believe you are having problem(s) with the medication(s) or have any questions.  Referrals have been made during this visit. You should expect to hear back from our schedulers in about 7-10 days in regards to establishing an appointment with the specialists we discussed.   If your symptoms worsen or fail to improve, please contact our office for further instruction, or in case of emergency go directly to the emergency room at the closest medical facility.   Peripheral Neuropathy Peripheral neuropathy is a type of nerve damage. It affects nerves that carry signals between the spinal cord and other parts of the body. These are called peripheral nerves. With peripheral neuropathy, one nerve or a group of nerves may be damaged.  CAUSES  Many things can damage peripheral nerves. For some people with peripheral neuropathy, the cause is unknown. Some causes include:  Diabetes. This is the most common cause of peripheral neuropathy.  Injury to a nerve.  Pressure or stress on a nerve that lasts a long time.  Too little vitamin B. Alcoholism can lead to this.  Infections.  Autoimmune diseases, such as multiple sclerosis and systemic lupus erythematosus.  Inherited nerve diseases.  Some medicines, such as cancer drugs.  Toxic substances, such as lead and mercury.  Too little blood flowing to the legs.  Kidney disease.  Thyroid disease. SIGNS AND SYMPTOMS  Different people have different symptoms. The symptoms you have will depend on which of your nerves is damaged. Common symptoms include:  Loss of feeling (numbness) in the feet and hands.  Tingling in the feet and hands.  Pain that burns.  Very sensitive skin.  Weakness.  Not being able to move a part of the  body (paralysis).  Muscle twitching.  Clumsiness or poor coordination.  Loss of balance.  Not being able to control your bladder.  Feeling dizzy.  Sexual problems. DIAGNOSIS  Peripheral neuropathy is a symptom, not a disease. Finding the cause of peripheral neuropathy can be hard. To figure that out, your health care provider will take a medical history and do a physical exam. A neurological exam will also be done. This involves checking things affected by your brain, spinal cord, and nerves (nervous system). For example, your health care provider will check your reflexes, how you move, and what you can feel.  Other types of tests may also be ordered, such as:  Blood tests.  A test of the fluid in your spinal cord.  Imaging tests, such as CT scans or an MRI.  Electromyography (EMG). This test checks the nerves that control muscles.  Nerve conduction velocity tests. These tests check how fast messages pass through your nerves.  Nerve biopsy. A small piece of nerve is removed. It is then checked under a microscope. TREATMENT   Medicine is often used to treat peripheral neuropathy. Medicines may include:  Pain-relieving medicines. Prescription or over-the-counter medicine may be suggested.  Antiseizure medicine. This may be used for pain.  Antidepressants. These also may help ease pain from neuropathy.  Lidocaine. This is a numbing medicine. You might wear a patch or be given a shot.  Mexiletine. This medicine is typically used to help control irregular heart rhythms.  Surgery. Surgery may be needed to relieve pressure on a nerve or to destroy a nerve that is causing  pain.  Physical therapy to help movement.  Assistive devices to help movement. HOME CARE INSTRUCTIONS   Only take over-the-counter or prescription medicines as directed by your health care provider. Follow the instructions carefully for any given medicines. Do not take any other medicines without first  getting approval from your health care provider.  If you have diabetes, work closely with your health care provider to keep your blood sugar under control.  If you have numbness in your feet:  Check every day for signs of injury or infection. Watch for redness, warmth, and swelling.  Wear padded socks and comfortable shoes. These help protect your feet.  Do not do things that put pressure on your damaged nerve.  Do not smoke. Smoking keeps blood from getting to damaged nerves.  Avoid or limit alcohol. Too much alcohol can cause a lack of B vitamins. These vitamins are needed for healthy nerves.  Develop a good support system. Coping with peripheral neuropathy can be stressful. Talk to a mental health specialist or join a support group if you are struggling.  Follow up with your health care provider as directed. SEEK MEDICAL CARE IF:   You have new signs or symptoms of peripheral neuropathy.  You are struggling emotionally from dealing with peripheral neuropathy.  You have a fever. SEEK IMMEDIATE MEDICAL CARE IF:   You have an injury or infection that is not healing.  You feel very dizzy or begin vomiting.  You have chest pain.  You have trouble breathing.   This information is not intended to replace advice given to you by your health care provider. Make sure you discuss any questions you have with your health care provider.   Document Released: 12/03/2002 Document Revised: 08/25/2011 Document Reviewed: 08/20/2013 Elsevier Interactive Patient Education [employer].

## 2015-12-10 ENCOUNTER — Telehealth: Payer: Self-pay

## 2015-12-10 NOTE — Telephone Encounter (Signed)
Incoming call regarding neurology referral;  The patient stated she was not sure what neurology did and why she should go;  Explained neurology services directed toward evaluating her numbness in LE; for dx and treatment;  After discussion; agreed to fup and call neurology at [phone] for an apt.

## 2015-12-10 NOTE — Telephone Encounter (Signed)
This patient called back for AWV; Agreed to come in on 12/20 at 1pm; will have to call [facility] to arrange and will call for any further issues;  Will bring in glucometer and review Diabetes as a part of the plan.

## 2015-12-11 ENCOUNTER — Telehealth: Payer: Self-pay

## 2015-12-11 NOTE — Telephone Encounter (Signed)
Call from Mrs. Florentine to confirm apt and place on 12/20 at 1pm

## 2015-12-12 ENCOUNTER — Telehealth: Payer: Self-pay

## 2015-12-12 NOTE — Telephone Encounter (Signed)
Mrs. Pillion called back this am to confirm her apt for Tuesday 12/20 at 1pam

## 2015-12-16 ENCOUNTER — Other Ambulatory Visit: Payer: Self-pay | Admitting: Family

## 2015-12-16 ENCOUNTER — Telehealth: Payer: Self-pay

## 2015-12-16 ENCOUNTER — Telehealth: Payer: Self-pay | Admitting: Family

## 2015-12-16 ENCOUNTER — Ambulatory Visit (INDEPENDENT_AMBULATORY_CARE_PROVIDER_SITE_OTHER): Payer: Medicare Other

## 2015-12-16 VITALS — BP 124/60 | Ht 62.0 in | Wt 166.8 lb

## 2015-12-16 DIAGNOSIS — Z0000 Encounter for general adult medical examination without abnormal findings: Secondary | ICD-10-CM | POA: Diagnosis not present

## 2015-12-16 NOTE — Patient Instructions (Addendum)
Sheila [name] , Thank you for taking time to come for your Medicare Wellness Visit. I appreciate your ongoing commitment to your health goals. Please review the following plan we discussed and let me know if I can assist you in the future.   Hep C screen at next blood draw Urine microalbumin Will have mammogram done / it is ordered;  Needs eye exam when she can schedule   [name] will contact 1. Community health response program for assistance 2. Listed Adult Day centers for you to try   [place] 1 review  Adult [place] [address]  [phone]  [place] No reviews  Adult [place] [address]  [phone]  [facility] No reviews  Adult [place]  [phone]  3. Will contact [employer] for referral    These are the goals we discussed: Goals    . patient     Would enjoy reading more; may go to [employer];  Has a girlfriend that goes, [name] and will call her.        This is a list of the screening recommended for you and due dates:  Health Maintenance  Topic Date Due  .  Hepatitis C: One time screening is recommended by Center for Disease Control  (CDC) for  adults born from [birth year] through 1965.   August 08, 2064  . HIV Screening  04/19/2064  . Pap Smear  04/19/2064  . Shingles Vaccine  04/19/2064  . Eye exam for diabetics  03/09/2064  . Urine Protein Check  01/23/2064  . Pneumonia vaccines (1 of 2 - PCV13) 04/19/2064  . Complete foot exam   10/08/2064  . Colon Cancer Screening  03/09/2064*  . Flu Shot  03/11/2064*  . Hemoglobin A1C  03/04/2064  . Mammogram  12/03/2064  . Tetanus Vaccine  10/08/2024  . DEXA scan (bone density measurement)  Completed  *Topic was postponed. The date shown is not the original due date.      Bone Densitometry Bone densitometry is an imaging test that uses a special X-ray to measure the amount of calcium and other minerals in  your bones (bone density). This test is also known as a bone mineral density test or dual-energy X-ray absorptiometry (DXA). The test can measure bone density at your hip and your spine. It is similar to having a regular X-ray. You may have this test to:  Diagnose a condition that causes weak or thin bones (osteoporosis).  Predict your risk of a broken bone (fracture).  Determine how well osteoporosis treatment is working. LET [facility] CARE PROVIDER KNOW ABOUT:  Any allergies you have.  All medicines you are taking, including vitamins, herbs, eye drops, creams, and over-the-counter medicines.  Previous problems you or members of your family have had with the use of anesthetics.  Any blood disorders you have.  Previous surgeries you have had.  Medical conditions you have.  Possibility of pregnancy.  Any other medical test you had within the previous 14 days that used contrast material. RISKS AND COMPLICATIONS Generally, this is a safe procedure. However, problems can occur and may include the following:  This test exposes you to a very small amount of radiation.  The risks of radiation exposure may be greater to unborn children. BEFORE THE PROCEDURE  Do not take any calcium supplements for 24 hours before having the test. You can otherwise eat and  drink what you usually do.  Take off all metal jewelry, eyeglasses, dental appliances, and any other metal objects. PROCEDURE  You may lie on an exam table. There will be an X-ray generator below you and an imaging device above you.  Other devices, such as boxes or braces, may be used to position your body properly for the scan.  You will need to lie still while the machine slowly scans your body.  The images will show up on a computer monitor. AFTER THE PROCEDURE You may need more testing at a later time.   This information is not intended to replace advice given to you by your health care provider. Make sure you discuss  any questions you have with your health care provider.   Document Released: 01/04/2005 Document Revised: 01/03/2015 Document Reviewed: 05/23/2014 Elsevier Interactive Patient Education 2016 [place] in the [place]  Falls can cause injuries. They can happen to people of all ages. There are many things you can do to make your home safe and to help prevent falls.  WHAT CAN I DO ON THE OUTSIDE OF MY HOME?  Regularly fix the edges of walkways and driveways and fix any cracks.  Remove anything that might make you trip as you walk through a door, such as a raised step or threshold.  Trim any bushes or trees on the path to your home.  Use bright outdoor lighting.  Clear any walking paths of anything that might make someone trip, such as rocks or tools.  Regularly check to see if handrails are loose or broken. Make sure that both sides of any steps have handrails.  Any raised decks and porches should have guardrails on the edges.  Have any leaves, snow, or ice cleared regularly.  Use sand or salt on walking paths during winter.  Clean up any spills in your garage right away. This includes oil or grease spills. WHAT CAN I DO IN THE BATHROOM?   Use night lights.  Install grab bars by the toilet and in the tub and shower. Do not use towel bars as grab bars.  Use non-skid mats or decals in the tub or shower.  If you need to sit down in the shower, use a plastic, non-slip stool.  Keep the floor dry. Clean up any water that spills on the floor as soon as it happens.  Remove soap buildup in the tub or shower regularly.  Attach bath mats securely with double-sided non-slip rug tape.  Do not have throw rugs and other things on the floor that can make you trip. WHAT CAN I DO IN THE BEDROOM?  Use night lights.  Make sure that you have a light by your bed that is easy to reach.  Do not use any sheets or blankets that are too big for your bed. They should not hang  down onto the floor.  Have a firm chair that has side arms. You can use this for support while you get dressed.  Do not have throw rugs and other things on the floor that can make you trip. WHAT CAN I DO IN THE KITCHEN?  Clean up any spills right away.  Avoid walking on wet floors.  Keep items that you use a lot in easy-to-reach places.  If you need to reach something above you, use a strong step stool that has a grab bar.  Keep electrical cords out of the way.  Do not use floor polish or wax that makes  floors slippery. If you must use wax, use non-skid floor wax.  Do not have throw rugs and other things on the floor that can make you trip. WHAT CAN I DO WITH MY STAIRS?  Do not leave any items on the stairs.  Make sure that there are handrails on both sides of the stairs and use them. Fix handrails that are broken or loose. Make sure that handrails are as long as the stairways.  Check any carpeting to make sure that it is firmly attached to the stairs. Fix any carpet that is loose or worn.  Avoid having throw rugs at the top or bottom of the stairs. If you do have throw rugs, attach them to the floor with carpet tape.  Make sure that you have a light switch at the top of the stairs and the bottom of the stairs. If you do not have them, ask someone to add them for you. WHAT ELSE CAN I DO TO HELP PREVENT FALLS?  Wear shoes that:  Do not have high heels.  Have rubber bottoms.  Are comfortable and fit you well.  Are closed at the toe. Do not wear sandals.  If you use a stepladder:  Make sure that it is fully opened. Do not climb a closed stepladder.  Make sure that both sides of the stepladder are locked into place.  Ask someone to hold it for you, if possible.  Clearly mark and make sure that you can see:  Any grab bars or handrails.  First and last steps.  Where the edge of each step is.  Use tools that help you move around (mobility aids) if they are needed.  These include:  Canes.  Walkers.  Scooters.  Crutches.  Turn on the lights when you go into a dark area. Replace any light bulbs as soon as they burn out.  Set up your furniture so you have a clear path. Avoid moving your furniture around.  If any of your floors are uneven, fix them.  If there are any pets around you, be aware of where they are.  Review your medicines with your doctor. Some medicines can make you feel dizzy. This can increase your chance of falling. Ask your doctor what other things that you can do to help prevent falls.   This information is not intended to replace advice given to you by your health care provider. Make sure you discuss any questions you have with your health care provider.   Document Released: 10/09/2009 Document Revised: 04/29/2015 Document Reviewed: 01/17/2015 Elsevier Interactive Patient Education 2016 [place] Maintenance, Female Adopting a healthy lifestyle and getting preventive care can go a long way to promote health and wellness. Talk with your health care provider about what schedule of regular examinations is right for you. This is a good chance for you to check in with your provider about disease prevention and staying healthy. In between checkups, there are plenty of things you can do on your own. Experts have done a lot of research about which lifestyle changes and preventive measures are most likely to keep you healthy. Ask your health care provider for more information. WEIGHT AND DIET  Eat a healthy diet  Be sure to include plenty of vegetables, fruits, low-fat dairy products, and lean protein.  Do not eat a lot of foods high in solid fats, added sugars, or salt.  Get regular exercise. This is one of the most important things you can do for your  health.  Most adults should exercise for at least 150 minutes each week. The exercise should increase your heart rate and make you sweat (moderate-intensity  exercise).  Most adults should also do strengthening exercises at least twice a week. This is in addition to the moderate-intensity exercise.  Maintain a healthy weight  Body mass index (BMI) is a measurement that can be used to identify possible weight problems. It estimates body fat based on height and weight. Your health care provider can help determine your BMI and help you achieve or maintain a healthy weight.  For females 18 years of age and older:   A BMI below 18.5 is considered underweight.  A BMI of 18.5 to 24.9 is normal.  A BMI of 25 to 29.9 is considered overweight.  A BMI of 30 and above is considered obese.  Watch levels of cholesterol and blood lipids  You should start having your blood tested for lipids and cholesterol at 65 years of age, then have this test every 5 years.  You may need to have your cholesterol levels checked more often if:  Your lipid or cholesterol levels are high.  You are older than 65 years of age.  You are at high risk for heart disease.  CANCER SCREENING   Lung Cancer  Lung cancer screening is recommended for adults 35-79 years old who are at high risk for lung cancer because of a history of smoking.  A yearly low-dose CT scan of the lungs is recommended for people who:  Currently smoke.  Have quit within the past 15 years.  Have at least a 30-pack-year history of smoking. A pack year is smoking an average of one pack of cigarettes a day for 1 year.  Yearly screening should continue until it has been 15 years since you quit.  Yearly screening should stop if you develop a health problem that would prevent you from having lung cancer treatment.  Breast Cancer  Practice breast self-awareness. This means understanding how your breasts normally appear and feel.  It also means doing regular breast self-exams. Let your health care provider know about any changes, no matter how small.  If you are in your 20s or 30s, you should  have a clinical breast exam (CBE) by a health care provider every 1-3 years as part of a regular health exam.  If you are 63 or older, have a CBE every year. Also consider having a breast X-ray (mammogram) every year.  If you have a family history of breast cancer, talk to your health care provider about genetic screening.  If you are at high risk for breast cancer, talk to your health care provider about having an MRI and a mammogram every year.  Breast cancer gene (BRCA) assessment is recommended for women who have family members with BRCA-related cancers. BRCA-related cancers include:  Breast.  Ovarian.  Tubal.  Peritoneal cancers.  Results of the assessment will determine the need for genetic counseling and BRCA1 and BRCA2 testing. Cervical Cancer Your health care provider may recommend that you be screened regularly for cancer of the pelvic organs (ovaries, uterus, and vagina). This screening involves a pelvic examination, including checking for microscopic changes to the surface of your cervix (Pap test). You may be encouraged to have this screening done every 3 years, beginning at age 74.  For women ages 67-65, health care providers may recommend pelvic exams and Pap testing every 3 years, or they may recommend the Pap and pelvic exam, combined  with testing for human papilloma virus (HPV), every 5 years. Some types of HPV increase your risk of cervical cancer. Testing for HPV may also be done on women of any age with unclear Pap test results.  Other health care providers may not recommend any screening for nonpregnant women who are considered low risk for pelvic cancer and who do not have symptoms. Ask your health care provider if a screening pelvic exam is right for you.  If you have had past treatment for cervical cancer or a condition that could lead to cancer, you need Pap tests and screening for cancer for at least 20 years after your treatment. If Pap tests have been  discontinued, your risk factors (such as having a new sexual partner) need to be reassessed to determine if screening should resume. Some women have medical problems that increase the chance of getting cervical cancer. In these cases, your health care provider may recommend more frequent screening and Pap tests. Colorectal Cancer  This type of cancer can be detected and often prevented.  Routine colorectal cancer screening usually begins at 65 years of age and continues through 65 years of age.  Your health care provider may recommend screening at an earlier age if you have risk factors for colon cancer.  Your health care provider may also recommend using home test kits to check for hidden blood in the stool.  A small camera at the end of a tube can be used to examine your colon directly (sigmoidoscopy or colonoscopy). This is done to check for the earliest forms of colorectal cancer.  Routine screening usually begins at age 19.  Direct examination of the colon should be repeated every 5-10 years through 65 years of age. However, you may need to be screened more often if early forms of precancerous polyps or small growths are found. Skin Cancer  Check your skin from head to toe regularly.  Tell your health care provider about any new moles or changes in moles, especially if there is a change in a mole's shape or color.  Also tell your health care provider if you have a mole that is larger than the size of a pencil eraser.  Always use sunscreen. Apply sunscreen liberally and repeatedly throughout the day.  Protect yourself by wearing long sleeves, pants, a wide-brimmed hat, and sunglasses whenever you are outside. HEART DISEASE, DIABETES, AND HIGH BLOOD PRESSURE   High blood pressure causes heart disease and increases the risk of stroke. High blood pressure is more likely to develop in:  People who have blood pressure in the high end of the normal range (130-139/85-89 mm Hg).  People  who are overweight or obese.  People who are African American.  If you are 78-57 years of age, have your blood pressure checked every 3-5 years. If you are 78 years of age or older, have your blood pressure checked every year. You should have your blood pressure measured twice--once when you are at a hospital or clinic, and once when you are not at a hospital or clinic. Record the average of the two measurements. To check your blood pressure when you are not at a hospital or clinic, you can use:  An automated blood pressure machine at a pharmacy.  A home blood pressure monitor.  If you are between 58 years and 1 years old, ask your health care provider if you should take aspirin to prevent strokes.  Have regular diabetes screenings. This involves taking a blood sample to  check your fasting blood sugar level.  If you are at a normal weight and have a low risk for diabetes, have this test once every three years after 65 years of age.  If you are overweight and have a high risk for diabetes, consider being tested at a younger age or more often. PREVENTING INFECTION  Hepatitis B  If you have a higher risk for hepatitis B, you should be screened for this virus. You are considered at high risk for hepatitis B if:  You were born in a country where hepatitis B is common. Ask your health care provider which countries are considered high risk.  Your parents were born in a high-risk country, and you have not been immunized against hepatitis B (hepatitis B vaccine).  You have HIV or AIDS.  You use needles to inject street drugs.  You live with someone who has hepatitis B.  You have had sex with someone who has hepatitis B.  You get hemodialysis treatment.  You take certain medicines for conditions, including cancer, organ transplantation, and autoimmune conditions. Hepatitis C  Blood testing is recommended for:  Everyone born from [birth year] through 1966.  Anyone with known risk factors for  hepatitis C. Sexually transmitted infections (STIs)  You should be screened for sexually transmitted infections (STIs) including gonorrhea and chlamydia if:  You are sexually active and are younger than 65 years of age.  You are older than 65 years of age and your health care provider tells you that you are at risk for this type of infection.  Your sexual activity has changed since you were last screened and you are at an increased risk for chlamydia or gonorrhea. Ask your health care provider if you are at risk.  If you do not have HIV, but are at risk, it may be recommended that you take a prescription medicine daily to prevent HIV infection. This is called pre-exposure prophylaxis (PrEP). You are considered at risk if:  You are sexually active and do not regularly use condoms or know the HIV status of your partner(s).  You take drugs by injection.  You are sexually active with a partner who has HIV. Talk with your health care provider about whether you are at high risk of being infected with HIV. If you choose to begin PrEP, you should first be tested for HIV. You should then be tested every 3 months for as long as you are taking PrEP.  PREGNANCY   If you are premenopausal and you may become pregnant, ask your health care provider about preconception counseling.  If you may become pregnant, take 400 to 800 micrograms (mcg) of folic acid every day.  If you want to prevent pregnancy, talk to your health care provider about birth control (contraception). OSTEOPOROSIS AND MENOPAUSE   Osteoporosis is a disease in which the bones lose minerals and strength with aging. This can result in serious bone fractures. Your risk for osteoporosis can be identified using a bone density scan.  If you are 50 years of age or older, or if you are at risk for osteoporosis and fractures, ask your health care provider if you should be screened.  Ask your health care provider whether you should take a  calcium or vitamin D supplement to lower your risk for osteoporosis.  Menopause may have certain physical symptoms and risks.  Hormone replacement therapy may reduce some of these symptoms and risks. Talk to your health care provider about whether hormone replacement therapy is right  for you.  HOME CARE INSTRUCTIONS   Schedule regular health, dental, and eye exams.  Stay current with your immunizations.   Do not use any tobacco products including cigarettes, chewing tobacco, or electronic cigarettes.  If you are pregnant, do not drink alcohol.  If you are breastfeeding, limit how much and how often you drink alcohol.  Limit alcohol intake to no more than 1 drink per day for nonpregnant women. One drink equals 12 ounces of beer, 5 ounces of wine, or 1 ounces of hard liquor.  Do not use street drugs.  Do not share needles.  Ask your health care provider for help if you need support or information about quitting drugs.  Tell your health care provider if you often feel depressed.  Tell your health care provider if you have ever been abused or do not feel safe at home.   This information is not intended to replace advice given to you by your health care provider. Make sure you discuss any questions you have with your health care provider.   Document Released: 06/28/2011 Document Revised: 01/03/2015 Document Reviewed: 11/14/2013 Elsevier Interactive Patient Education 2016 [place] A mammogram is an X-ray of the breasts that is done to check for abnormal changes. This procedure can screen for and detect any changes that may suggest breast cancer. A mammogram can also identify other changes and variations in the breast, such as:  Inflammation of the breast tissue (mastitis).  An infected area that contains a collection of pus (abscess).  A fluid-filled sac (cyst).  Fibrocystic changes. This is when breast tissue becomes denser, which can make the tissue feel  rope-like or uneven under the skin.  Tumors that are not cancerous (benign). LET [facility] CARE PROVIDER KNOW ABOUT:  Any allergies you have.  If you have breast implants.  If you have had previous breast disease, biopsy, or surgery.  If you are breastfeeding.  Any possibility that you could be pregnant, if this applies.  If you are younger than age 22.  If you have a family history of breast cancer. RISKS AND COMPLICATIONS Generally, this is a safe procedure. However, problems may occur, including:  Exposure to radiation. Radiation levels are very low with this test.  The results being misinterpreted.  The need for further tests.  The inability of the mammogram to detect certain cancers. BEFORE THE PROCEDURE  Schedule your test about 1-2 weeks after your menstrual period. This is usually when your breasts are the least tender.  If you have had a mammogram done at a different facility in the past, get the mammogram X-rays or have them sent to your current exam facility in order to compare them.  Wash your breasts and under your arms the day of the test.  Do not wear deodorants, perfumes, lotions, or powders anywhere on your body on the day of the test.  Remove any jewelry from your neck.  Wear clothes that you can change into and out of easily. PROCEDURE  You will undress from the waist up and put on a gown.  You will stand in front of the X-ray machine.  Each breast will be placed between two plastic or glass plates. The plates will compress your breast for a few seconds. Try to stay as relaxed as possible during the procedure. This does not cause any harm to your breasts and any discomfort you feel will be very brief.  X-rays will be taken from different angles of each breast.  The procedure may vary among health care providers and hospitals. AFTER THE PROCEDURE  The mammogram will be examined by a specialist (radiologist).  You may need to repeat certain  parts of the test, depending on the quality of the images. This is commonly done if the radiologist needs a better view of the breast tissue.  Ask when your test results will be ready. Make sure you get your test results.  You may resume your normal activities.   This information is not intended to replace advice given to you by your health care provider. Make sure you discuss any questions you have with your health care provider.   Document Released: 12/10/2000 Document Revised: 09/03/2015 Document Reviewed: 02/21/2015 Elsevier Interactive Patient Education [employer].

## 2015-12-16 NOTE — Progress Notes (Signed)
Subjective:   Sheila [name] is a 63 y.o. female who presents for Medicare Annual (Subsequent) preventive examination.  Review of Systems:  HRA assessment completed during visit; Sheila [name] The Patient was informed that this wellness visit is to identify risk and educate on how to reduce risk for increase disease through lifestyle changes.   ROS deferred as the patient recently seen physician for c/o of neuropathy;  Neuro referral in process; had questions in regard to management of diabetes  Medical issues DM  Results for Sheila [name], Sheila [name] (MRN [id number]) as of 12/16/2015 13:11  Ref. Range 02/15/2012 15:39 03/01/2013 09:26 01/22/2014 11:41 10/08/2014 11:08 09/05/2015 14:29  Hemoglobin A1C Latest Ref Range: 4.6-6.5 % 5.3 5.3 5.3 5.4 6.5   Lipids 01/22/2014; chol 145; Trig 91; HDL 56; LDL 71 Checks BS in am and late afternoon and before she goes to sleep   BMI:  Diet; cereal or boiled egg;  Lunch; soup; freezer and refrig are full;  Supper; [employer] and mashed potatoes;  Sometimes heat something frozen; just cooks for herself;   Discussed Meal of Meals; Gave permission to call and have them outreach; Community health response program and will have them outreach;   Exercise; walking in the area; outside with the walker;  Getting the mail;   SAFETY Did discuss living alone; fell last year;  Moved about home; one bedroom; terrace; has lifeline at home; Has not fallen in one year;  Son is working; dtr in [employer], [employer] and she sees them on weekends Grandchildren; 18 and granddtr is 1;   Safety reviewed for the home; including removal of clutter; has room for walker;  Uses tub but doesn't use anymore; spit baths; does have a shower is in the tub; bathroom safety; community safety; smoke detectors and firearms safety reviewed.    Driving accidents; does not drive; uses [facility]; has not driven since she has been here; children take her to where she wants to go;  Goes to  church for spiritual and socialization needs;   Discussed [facility]; [facility];  [facility] review  Adult [place] [address]  [phone]    [place] No reviews  Adult [place] [address]  [phone]  [place] No reviews  Adult [place]  [phone]  Sun protection; does not wear sun screen as she is not out in the sun very much Stressors;   Medication review/ Needs refil on cyclobenzaprine;  Needs to go to the drug store for fish oil and Mg. 56m   Fall assessment not this year; but uses walker all the time  [facility]   Mobilization and Functional losses in the last year/ no Sleep patterns/ sleeps well   Urinary or fecal incontinence reviewed/ no urinary or bowel issues  Lifeline: http://www.lifelinesys.com/content/home; 1307-121-8838x2102 / has a lifeline  Counseling: Hep C HIV screening/ declined Ua microablumin/ kidneys;  Foot exam was completed by GTerri Piedraat last apt  [place]; [place] in 2021 EKG: 11/2015 Hearing: '[id number]'  both ears Dexa: has not had; will hold off for now [name] will try to do soon Pap/ educated on gyn exam;  Ophthalmology exam; Need to schedule Immunizations Due Zostavax; Prevnar 13  Current Care Team reviewed and updated  Cardiac Risk Factors include: advanced age (>569m, >6>83omen);diabetes  mellitus;dyslipidemia;obesity (BMI >30kg/m2)     Objective:     Vitals: BP 124/60 mmHg  Ht '5\' 2"'  (1.575 m)  Wt 166 lb 12 oz (75.637 kg)  BMI 30.49 kg/m2  Tobacco History  Smoking status  . Former Smoker  . Types: Cigarettes  . Quit date: 02/15/1992  Smokeless tobacco  . Never Used     Counseling given: Yes   Past Medical History  Diagnosis Date  . Arthritis   . Diabetes mellitus     complicated by peripheral neuropathy  . Stroke ([place])   .  Seizures ([place])   . Hyperlipidemia    Past Surgical History  Procedure Laterality Date  . Cholecystectomy    . Tonsillectomy     Family History  Problem Relation Age of Onset  . Diabetes Mother   . Diabetes Brother    History  Sexual Activity  . Sexual Activity:  . Partners: Male    Outpatient Encounter Prescriptions as of 12/16/2015  Medication Sig  . ACCU-CHEK FASTCLIX LANCETS MISC Use to help check blood sugars twice a day Dx E11.9  . aspirin 81 MG tablet Take 81 mg by mouth daily.  Marland Kitchen atorvastatin (LIPITOR) 20 MG tablet Take 1 tablet by mouth  daily  . Blood Glucose Monitoring Suppl (ACCU-CHEK AVIVA PLUS) W/DEVICE KIT Use as directed once daily  . cyclobenzaprine (FLEXERIL) 10 MG tablet TAKE ONE TABLET BY MOUTH  THREE TIMES DAILY AS NEEDED FOR MUSCLE SPASM  . fish oil-omega-3 fatty acids 1000 MG capsule Take 1 g by mouth daily.  Marland Kitchen glipiZIDE (GLUCOTROL) 10 MG tablet Take 1 tablet by mouth  daily before breakfast  . glucose blood (ACCU-CHEK AVIVA PLUS) test strip 1 each by Other route 2 (two) times daily. Use to check blood sugars twice a day Dx E11.9  . Lancets Misc. (UNISTIK 2 COMFORT) MISC Use 1 device to check blood sugar 1-2 times daily. Use new device for each test.  . LORazepam (ATIVAN) 0.5 MG tablet TAKE ONE TABLET BY MOUTH TWICE DAILY AS NEEDED FOR ANXIETY  . magnesium 30 MG tablet Take 30 mg by mouth daily.  . meloxicam (MOBIC) 15 MG tablet Take 1 tablet by mouth  daily  . metFORMIN (GLUCOPHAGE) 500 MG tablet Take 2 tablets by mouth  daily  . Multiple Vitamin (MULTIVITAMIN) tablet Take 1 tablet by mouth daily.  . NONFORMULARY OR COMPOUNDED ITEM AFO for R leg for dropped foot Dx history of cva  . sertraline (ZOLOFT) 100 MG tablet Take 1 tablet (100 mg total) by mouth daily.  . traMADol (ULTRAM) 50 MG tablet TAKE ONE TABLET BY MOUTH EVERY 6 HOURS AS NEEDED FOR PAIN  . gabapentin (NEURONTIN) 300 MG capsule Take 1 capsule by mouth at  bedtime  . ibuprofen (ADVIL,MOTRIN)  800 MG tablet Take 800 mg by mouth every 8 (eight) hours as needed. Reported on 12/16/2015   No facility-administered encounter medications on file as of 12/16/2015.    Activities of Daily Living In your present state of health, do you have any difficulty performing the following activities: 12/16/2015  Hearing? N  Vision? N  Difficulty concentrating or making decisions? (No Data)  Walking or climbing stairs? N  Dressing or bathing? Y  Doing errands, shopping? Y  Preparing Food and eating ? Y  Using the Toilet? N  In the past six months, have you accidently leaked urine? N  Do you have problems with loss of bowel control? N  Managing your Medications? N  Managing your  Finances? N  Housekeeping or managing your Housekeeping? N    Patient Care Team: Golden Circle, FNP as PCP - General (Family Medicine)    Assessment:    Assessment   Patient presents for yearly preventative medicine examination. Medicare questionnaire screening were completed, i.e. Functional; fall risk; depression, memory loss and hearing.  Discussed ADL's and states she cares for self; takes spitz baths; no falls; would like a chair for shower and referred to DME; Medicare does not pay for shower chair; Discussed Community response program to see if they will come and assess and assist; Has home health aid; the patient agreed to referral; also agreed to referral for [facility] referral;   The patient admits to occasionally "blue mood". States she listens to music; sings or reads her Bible to feel better; this helps her/ Set a goal to call a friend [name] and go to [employer] and start reading more.  Responses are often non-specific but was very specific regarding her meds; Reviewed in detail how to use new glucometer and continues to use the "old" pin to prick herself; Wrote out instructions for new pen but prefers the old and actually puts the lancet in the glucometer and sticks her finger; without  actually using the injector.   Declines all vaccines; dexa scan; labs today but agrees to have her mammogram and will schedule her eye visit;    Medication reconciliation, past medical history, social history, problem list and allergies were reviewed in detail with the patient  Goals were established with regard going to [employer] with friend and reading more;   End of life planning was discussed and declined today   Exercise Activities and Dietary recommendations Current Exercise Habits:: Home exercise routine, Type of exercise: walking, Time (Minutes): 20, Frequency (Times/Week): 5, Weekly Exercise (Minutes/Week): 100, Intensity: Mild  Goals    . patient     Would enjoy reading more; may go to [employer];  Has a girlfriend that goes, [name] and will call her.       Fall Risk Fall Risk  12/16/2015 03/12/2015  Falls in the past year? No No   Depression Screen PHQ 2/9 Scores 03/12/2015  PHQ - 2 Score 0     Cognitive Testing No flowsheet data found.  Declined by the patient  Immunization History  Administered Date(s) Administered  . Tdap 10/08/2014   Screening Tests Health Maintenance  Topic Date Due  . Hepatitis C Screening  June 13, 1951  . HIV Screening  04/19/1965  . PAP SMEAR  04/20/1971  . ZOSTAVAX  04/19/2010  . OPHTHALMOLOGY EXAM  03/09/2013  . URINE MICROALBUMIN  01/22/2015  . PNA vac Low Risk Adult (1 of 2 - PCV13) 04/20/2015  . FOOT EXAM  10/09/2015  . COLONOSCOPY  03/09/2016 (Originally 04/19/2000)  . INFLUENZA VACCINE  03/11/2016 (Originally 07/28/2015)  . HEMOGLOBIN A1C  03/04/2016  . MAMMOGRAM  12/03/2016  . TETANUS/TDAP  10/08/2024  . DEXA SCAN  Completed      Plan:   Hep C screen at next blood draw Urine microalbumin at time of next labs;  Will have mammogram completed  / it is ordered;  Needs eye exam when she can schedule   [name] will contact 1. Community health response program for assistance 2. Listed Adult Day centers for you to try     [place] 1 review  Adult [place] [address]  [phone]  [place] No reviews  Adult [place] [address]  [phone]  [place] No reviews  Adult [facility] [place]  [phone]  3. Will contact [facility] for referral   The patient would like her cyclobenzaprine refilled; will forward to [place] for review.   During the course of the visit the patient was educated and counseled about the following appropriate screening and preventive services:   Vaccines to include Pneumoccal, Influenza, Hepatitis B, Td, Zostavax, HCV  Electrocardiogram/ completed   Cardiovascular Disease/ good  Colorectal cancer screening/ declined  Bone density screening/ declined  Diabetes screening/ checked today and can correctly check her BS; States she checks her BS three times a day; 176 today post lunch  Glaucoma screening/ good  Mammography/PAP/ good   Nutrition counseling reviewed; will try referral to meals on wheels if this works for her;    Patient Instructions (the written plan) was given to the patient.   Wynetta Fines, RN  12/16/2015

## 2015-12-16 NOTE — Telephone Encounter (Signed)
[employer] is following up on a prescription request for pts diabetic supplies [phone]

## 2015-12-16 NOTE — Telephone Encounter (Signed)
The patient here for review of needs and Diabetes;  States she would like to have her cyclobenzaprine 10mg  TID for muscle spasm be refilled   Please advise

## 2015-12-17 MED ORDER — CYCLOBENZAPRINE HCL 10 MG PO TABS: ORAL_TABLET | ORAL | Status: DC

## 2015-12-17 NOTE — Telephone Encounter (Signed)
Medication refilled

## 2015-12-17 NOTE — Addendum Note (Signed)
Addended by: Mauricio Po D on: 12/17/2015 10:30 PM   Modules accepted: Orders

## 2015-12-17 NOTE — Telephone Encounter (Signed)
Call to [facility] for referral to assess needs; Patient unspecific regarding diet;  SW will go out and assess;   Also call placed to the [facility]; Stated they will go out and prepare pill box and give or draw up injections etc with HHA as needed for personal care, but referrals are via SW at [place]; [phone]   Will plan to fup with the patient in Jan.

## 2015-12-23 ENCOUNTER — Other Ambulatory Visit: Payer: Self-pay

## 2015-12-23 ENCOUNTER — Telehealth: Payer: Self-pay

## 2015-12-23 DIAGNOSIS — F411 Generalized anxiety disorder: Secondary | ICD-10-CM

## 2015-12-23 MED ORDER — SERTRALINE HCL 100 MG PO TABS: 100.0000 mg | ORAL_TABLET | Freq: Every day | ORAL | Status: DC

## 2015-12-23 NOTE — Telephone Encounter (Signed)
Pt called wanting to see if she could get a home health nurse to be there while she takes a shower bc she is scared that she will fall. She wanted to see if you would agree to it. Please advise

## 2015-12-24 ENCOUNTER — Telehealth: Payer: Self-pay | Admitting: Family

## 2015-12-24 NOTE — Telephone Encounter (Signed)
Pt called stated that her insurance will not pay for cyclobenzaprine (FLEXERIL) 10 MG tablet, we need to start PA on this med. Please call pt once its done

## 2015-12-26 ENCOUNTER — Other Ambulatory Visit: Payer: Self-pay

## 2015-12-26 MED ORDER — LORAZEPAM 0.5 MG PO TABS: ORAL_TABLET | ORAL | Status: DC

## 2015-12-26 MED ORDER — TRAMADOL HCL 50 MG PO TABS: ORAL_TABLET | ORAL | Status: DC

## 2015-12-26 NOTE — Telephone Encounter (Signed)
Pt requesting refill of tramadol and lorazepam. They were both last filled on 9/6

## 2015-12-31 ENCOUNTER — Telehealth: Payer: Self-pay

## 2015-12-31 NOTE — Telephone Encounter (Signed)
Patient is requesting a call back in regards to status.

## 2015-12-31 NOTE — Telephone Encounter (Signed)
PA initiated via covermymeds. Key for PA is [id number]

## 2016-01-01 NOTE — Telephone Encounter (Signed)
Please have her check her formulary as it is likely other muscle relaxors will be declined as well.

## 2016-01-01 NOTE — Telephone Encounter (Signed)
Returned pts call. PA was done for medication. Pt is aware and I did let her know it will take several days to hear something back. Pt wants to know if there is any other muscle relaxer that can be sent in until we can get that PA approved? Please advise.

## 2016-01-02 NOTE — Telephone Encounter (Signed)
Called insurance company to see if they would cover anything else. No other muscle relaxant will be covered through insurance without PA. Explained the best option is to wait on the PA for the cyclobenzaprine. Pt understood.

## 2016-01-05 ENCOUNTER — Other Ambulatory Visit: Payer: Self-pay | Admitting: Family

## 2016-01-05 NOTE — Telephone Encounter (Signed)
Pharmacy to be contacted.

## 2016-01-05 NOTE — Telephone Encounter (Signed)
Fax received that NO PA was needed.

## 2016-01-08 ENCOUNTER — Other Ambulatory Visit: Payer: Self-pay | Admitting: Family

## 2016-01-08 ENCOUNTER — Telehealth: Payer: Self-pay | Admitting: Family

## 2016-01-08 NOTE — Telephone Encounter (Signed)
Patient has a concern about a medication she is now having to pay a copay for.  Can you please contact her back.

## 2016-01-08 NOTE — Telephone Encounter (Signed)
I have tried calling pt back several times. The phone rings once and goes to VM. Have left a message for pt to call back.

## 2016-01-08 NOTE — Telephone Encounter (Signed)
Last refill was gabapentin was 12/20.

## 2016-01-14 ENCOUNTER — Other Ambulatory Visit: Payer: Self-pay

## 2016-01-15 ENCOUNTER — Other Ambulatory Visit: Payer: Self-pay | Admitting: Family

## 2016-01-15 ENCOUNTER — Other Ambulatory Visit: Payer: Self-pay

## 2016-01-15 NOTE — Telephone Encounter (Signed)
rf to [facility] for tramadol and lorazepam

## 2016-01-22 ENCOUNTER — Other Ambulatory Visit: Payer: Self-pay

## 2016-01-22 DIAGNOSIS — Z1231 Encounter for screening mammogram for malignant neoplasm of breast: Secondary | ICD-10-CM

## 2016-01-22 MED ORDER — LORAZEPAM 0.5 MG PO TABS: ORAL_TABLET | ORAL | Status: DC

## 2016-01-22 MED ORDER — TRAMADOL HCL 50 MG PO TABS: ORAL_TABLET | ORAL | Status: DC

## 2016-01-30 ENCOUNTER — Ambulatory Visit (INDEPENDENT_AMBULATORY_CARE_PROVIDER_SITE_OTHER): Payer: Medicare Other | Admitting: Neurology

## 2016-01-30 ENCOUNTER — Encounter: Payer: Self-pay | Admitting: Neurology

## 2016-01-30 ENCOUNTER — Telehealth: Payer: Self-pay

## 2016-01-30 ENCOUNTER — Other Ambulatory Visit: Payer: Medicare Other

## 2016-01-30 VITALS — BP 130/78 | HR 80 | Ht 62.0 in | Wt 167.0 lb

## 2016-01-30 DIAGNOSIS — E0842 Diabetes mellitus due to underlying condition with diabetic polyneuropathy: Secondary | ICD-10-CM

## 2016-01-30 DIAGNOSIS — M21371 Foot drop, right foot: Secondary | ICD-10-CM | POA: Diagnosis not present

## 2016-01-30 DIAGNOSIS — Z8673 Personal history of transient ischemic attack (TIA), and cerebral infarction without residual deficits: Secondary | ICD-10-CM

## 2016-01-30 NOTE — Telephone Encounter (Signed)
Call to the patient regarding referrals to [facility]; Also [facility]. Is scheduled to go to Neurology today at 2pm to see Narda Amber; [facility] to pick her up at 12ish and reviewed address of the apt with her. Will fup in 30 days;

## 2016-01-30 NOTE — Progress Notes (Addendum)
[place] [facility] Clinic Note - Initial Visit   Date: 01/30/2016  Sheila [name] MRN: [id number] DOB: 18-Oct-1951   Dear Mauricio Po, NP:  Thank you for your kind referral of [place] for consultation of neuropathy. Although her history is well known to you, please allow [place] to reiterate it for the purpose of our medical record. The patient was accompanied to the clinic by self.   History of Present Illness: Sheila [name] is a 65 y.o. right-handed African American female with diabetes mellitus, stroke with residual right drop foot (~2007), hyperlipidemia, and depression  presenting for evaluation of neuropathy.    Around the fall of 2016, she began experiencing numbness over the soles of her foot. There is no tingling or pain.  Symptoms do not radiate up the leg.  She was started on gabapentin 383m at bedtime which provides some relief. She endorses imbalance and has been using a walker since 2014 and a cane for the past 10 years.  Her diabetes has been well controlled with last HbA1c at 6.5.    She reports having a stroke around 2007 and had residual right foot weakness.  [place]  She has been on aspiring 850mdaily since then.  She was wearing an AFO for some time, but now ambulates with a walker only.  She is careful and denies any falls.   Out-side paper records, electronic medical record, and images have been reviewed where available and summarized as:  MRI lumbar spine wo contrast 02/09/2015: The dominant RIGHT-sided abnormality is at L3-L4 where a far lateral and foraminal protrusion narrows the subarticular and foraminal zones. RIGHT L3 and RIGHT L4 nerve root impingement are likely.  Minor noncompressive pathology is observed at L1-2, L4-5, and L5-S1 as described above.  Lab Results  Component Value Date   TSH 1.28 10/08/2014   Lab Results  Component Value Date   HGBA1C 6.5 09/05/2015   Lab Results    Component Value Date   VITAMINB12 360 09/05/2015     Past Medical History  Diagnosis Date  . Arthritis   . Diabetes mellitus     complicated by peripheral neuropathy  . [facility]    residual right foot drop  . Hyperlipidemia     Past Surgical History  Procedure Laterality Date  . Cholecystectomy    . Tonsillectomy       Medications:  Outpatient Encounter Prescriptions as of 01/30/2016  Medication Sig Note  . ACCU-CHEK FASTCLIX LANCETS MISC Use to help check blood sugars twice a day Dx E11.9   . aspirin 81 MG tablet Take 81 mg by mouth daily.   . Marland Kitchentorvastatin (LIPITOR) 20 MG tablet Take 1 tablet by mouth  daily   . Blood Glucose Monitoring Suppl (ACCU-CHEK AVIVA PLUS) W/DEVICE KIT Use as directed once daily   . cyclobenzaprine (FLEXERIL) 10 MG tablet TAKE ONE TABLET BY MOUTH  THREE TIMES DAILY AS NEEDED FOR MUSCLE SPASM   . fish oil-omega-3 fatty acids 1000 MG capsule Take 1 g by mouth daily.   . Marland Kitchenabapentin (NEURONTIN) 300 MG capsule Take 1 capsule by mouth at  bedtime 12/16/2015: Not taking it yet; waiting to receive  . gabapentin (NEURONTIN) 300 MG capsule Take 1 capsule by mouth at  bedtime   . glipiZIDE (GLUCOTROL) 10 MG tablet Take 1 tablet by mouth  daily before breakfast   . glucose blood (ACCU-CHEK AVIVA PLUS) test strip 1  each by Other route 2 (two) times daily. Use to check blood sugars twice a day Dx E11.9   . ibuprofen (ADVIL,MOTRIN) 800 MG tablet Take 800 mg by mouth every 8 (eight) hours as needed. Reported on 12/16/2015   . Lancets Misc. (UNISTIK 2 COMFORT) MISC Use 1 device to check blood sugar 1-2 times daily. Use new device for each test.   . LORazepam (ATIVAN) 0.5 MG tablet TAKE ONE TABLET BY MOUTH TWICE DAILY AS NEEDED FOR ANXIETY   . magnesium 30 MG tablet Take 30 mg by mouth daily.   . meloxicam (MOBIC) 15 MG tablet Take 1 tablet by mouth  daily   . metFORMIN (GLUCOPHAGE) 500 MG tablet Take 2 tablets by mouth  daily   . Multiple Vitamin  (MULTIVITAMIN) tablet Take 1 tablet by mouth daily.   . NONFORMULARY OR COMPOUNDED ITEM AFO for R leg for dropped foot Dx history of cva   . sertraline (ZOLOFT) 100 MG tablet Take 1 tablet by mouth  daily   . traMADol (ULTRAM) 50 MG tablet TAKE ONE TABLET BY MOUTH EVERY 6 HOURS AS NEEDED FOR PAIN   . [DISCONTINUED] gabapentin (NEURONTIN) 300 MG capsule Take 1 capsule by mouth at  bedtime    No facility-administered encounter medications on file as of 01/30/2016.     Allergies:  Allergies  Allergen Reactions  . Sulfa Antibiotics   . Paxil [Paroxetine Hcl] Other (See Comments)    Dreams/ nightmares    Family History: Family History  Problem Relation Age of Onset  . Diabetes Mother     Deceased  . Diabetes Brother   . Healthy Son     Social History: Social History  Substance Use Topics  . Smoking status: Former Smoker    Types: Cigarettes    Quit date: 02/15/1992  . Smokeless tobacco: Never Used  . Alcohol Use: No     Comment: Socially   Social History   Social History Narrative   Fun: Talk, church activities    Denies any religious beliefs effecting health care.    Lives alone in a one story home.  Has 5 children.  Retired from [employer].    Review of Systems:  CONSTITUTIONAL: No fevers, chills, night sweats, or weight loss.   EYES: No visual changes or eye pain ENT: No hearing changes.  No history of nose bleeds.   RESPIRATORY: No cough, wheezing and shortness of breath.   CARDIOVASCULAR: Negative for chest pain, and palpitations.   GI: Negative for abdominal discomfort, blood in stools or black stools.  No recent change in bowel habits.   GU:  No history of incontinence.   MUSCLOSKELETAL: +history of joint pain or swelling.  No myalgias.   SKIN: Negative for lesions, rash, and itching.   HEMATOLOGY/ONCOLOGY: Negative for prolonged bleeding, bruising easily, and swollen nodes.  No history of cancer.   ENDOCRINE: Negative for cold or heat intolerance,  polydipsia or goiter.   PSYCH:  No depression or anxiety symptoms.   NEURO: As Above.   Vital Signs:  BP 130/78 mmHg  Pulse 80  Ht _0  (1.575 m)  Wt 167 lb (75.751 kg)  BMI 30.54 kg/m2 Pain Scale: 0 on a scale of 0-10   General Medical Exam:   General:  Well appearing, comfortable.   Eyes/ENT: see cranial nerve examination.   Neck: No masses appreciated.  Full range of motion without tenderness.  No carotid bruits. Respiratory:  Clear to auscultation, good air entry bilaterally.  Cardiac:  Regular rate and rhythm, no murmur.   Extremities:  No deformities, edema, or skin discoloration.  Skin:  No rashes or lesions.  Neurological Exam: MENTAL STATUS including orientation to time, place, person, recent and remote memory, attention span and concentration, language, and fund of knowledge is normal.  Speech is not dysarthric.  CRANIAL NERVES: II:  No visual field defects.  Unremarkable fundi.   III-IV-VI: Pupils equal round and reactive to light.  Normal conjugate, extra-ocular eye movements in all directions of gaze.  No nystagmus.  No ptosis.   V:  Normal facial sensation.     VII:  Normal facial symmetry and movements.  VIII:  Normal hearing and vestibular function.   IX-X:  Normal palatal movement.   XI:  Normal shoulder shrug and head rotation.   XII:  Normal tongue strength and range of motion, no deviation or fasciculation.  MOTOR:  No atrophy, fasciculations or abnormal movements.  No pronator drift.  Tone is normal.    Right Upper Extremity:    Left Upper Extremity:    Deltoid  5/5   Deltoid  5/5   Biceps  5/5   Biceps  5/5   Triceps  5/5   Triceps  5/5   Wrist extensors  5/5   Wrist extensors  5/5   Wrist flexors  5/5   Wrist flexors  5/5   Finger extensors  5/5   Finger extensors  5/5   Finger flexors  5/5   Finger flexors  5/5   Dorsal interossei  5/5   Dorsal interossei  5/5   Abductor pollicis  5/5   Abductor pollicis  5/5   Tone (Ashworth scale)  0  Tone  (Ashworth scale)  0   Right Lower Extremity:    Left Lower Extremity:    Hip flexors  5/5   Hip flexors  5/5   Hip extensors  5/5   Hip extensors  5/5   Knee flexors  5/5   Knee flexors  5/5   Knee extensors  5/5   Knee extensors  5/5   Dorsiflexors  5/5   Dorsiflexors  5/5   Plantarflexors  5/5   Plantarflexors  5/5   Toe extensors  5/5   Toe extensors  5/5   Toe flexors  5/5   Toe flexors  5/5   Tone (Ashworth scale)  0+  Tone (Ashworth scale)  0   MSRs:  Right                                                                 Left brachioradialis 2+  brachioradialis 2+  biceps 2+  biceps 2+  triceps 2+  triceps 2+  patellar 2+  patellar 2+  ankle jerk 1+  ankle jerk 1+  Hoffman no  Hoffman no  plantar response down  plantar response down   SENSORY:  Absent vibration, temperature, and pin prick in the feet bilaterally.  Romberg's sign is present.   COORDINATION/GAIT: Normal finger-to- nose-finger. Intact rapid alternating movements bilaterally.  Gait is assisted with rollator and shows mild dragging of the left lower leg.      IMPRESSION: Ms. Saunders Glance is a 65 year-old female referred for evaluation of bilateral feet paresthesias.  Her neurological examination  shows a distal predominant large fiber peripheral neuropathy affecting the feet in addition to chronic right foot drop from her stroke (2007) which details I do not have to review. I had extensive discussion with the patient regarding the pathogenesis, etiology, management, and natural course of neuropathy. Neuropathy tends to be slowly progressive, even with controlled risk factors.  Most likely etiology for her is diabetes.  Unfortunately, medications do not help alleviate numbness - her primary complaint - and are more helpful for painful paresthesia, which she denies.   PLAN/RECOMMENDATIONS:  1.  Check copper, SPEP with IFE 2.  NCS/EMG declined by patient, but will reconsider if symptoms worsen 3.  Start home physical therapy  for gait training 4.  US carotids to look for large vessel disease with her history of stroke 5.  Continue secondary prevention with aspirin 72m, statin therapy (LDL 71).  Her blood pressure and diabetes are well-controlled 6.  Continue gabapentin 3038mat bedtime   Return to clinic in 3 months.   The duration of this appointment visit was 45 minutes of face-to-face time with the patient.  Greater than 50% of this time was spent in counseling, explanation of diagnosis, planning of further management, and coordination of care.   Thank you for allowing me to participate in patient's care.  If I can answer any additional questions, I would be pleased to do so.    Sincerely,    Peretz Thieme K. PaPosey ProntoDO

## 2016-01-30 NOTE — Patient Instructions (Addendum)
1.  Continue gabapentin 300mg  at bedtime 2.  Start home physical therapy  3.  Lab testing 4.  Return to clinic 74-months

## 2016-02-02 ENCOUNTER — Other Ambulatory Visit: Payer: Self-pay | Admitting: *Deleted

## 2016-02-02 DIAGNOSIS — M21371 Foot drop, right foot: Secondary | ICD-10-CM

## 2016-02-02 DIAGNOSIS — R42 Dizziness and giddiness: Secondary | ICD-10-CM

## 2016-02-02 DIAGNOSIS — Z8673 Personal history of transient ischemic attack (TIA), and cerebral infarction without residual deficits: Secondary | ICD-10-CM

## 2016-02-03 LAB — PROTEIN ELECTROPHORESIS, SERUM
ALPHA-2-GLOBULIN: 0.9 g/dL (ref 0.5–0.9)
Albumin ELP: 3.7 g/dL — ABNORMAL LOW (ref 3.8–4.8)
Alpha-1-Globulin: 0.4 g/dL — ABNORMAL HIGH (ref 0.2–0.3)
BETA GLOBULIN: 0.4 g/dL (ref 0.4–0.6)
Beta 2: 0.4 g/dL (ref 0.2–0.5)
Gamma Globulin: 0.9 g/dL (ref 0.8–1.7)
Total Protein, Serum Electrophoresis: 6.6 g/dL (ref 6.1–8.1)

## 2016-02-03 LAB — IMMUNOFIXATION ELECTROPHORESIS
IGM, SERUM: 45 mg/dL — AB (ref 52–322)
IgA: 151 mg/dL (ref 69–380)
IgG (Immunoglobin G), Serum: 828 mg/dL (ref 690–1700)

## 2016-02-03 LAB — COPPER, SERUM: Copper: 139 ug/dL (ref 70–175)

## 2016-02-03 NOTE — Progress Notes (Signed)
Left message for patient to call me back.  Carotid US is scheduled for February 10 at 2:30.

## 2016-02-05 NOTE — Progress Notes (Signed)
Patient given appt time and date.

## 2016-02-06 ENCOUNTER — Ambulatory Visit (HOSPITAL_COMMUNITY)
Admission: RE | Admit: 2016-02-06 | Discharge: 2016-02-06 | Disposition: A | Discharge status: Home / Self Care | Payer: Medicare Other | Source: Ambulatory Visit | Attending: Neurology | Admitting: Neurology

## 2016-02-06 DIAGNOSIS — R202 Paresthesia of skin: Secondary | ICD-10-CM | POA: Diagnosis not present

## 2016-02-06 DIAGNOSIS — I739 Peripheral vascular disease, unspecified: Secondary | ICD-10-CM | POA: Diagnosis not present

## 2016-02-06 DIAGNOSIS — Z8673 Personal history of transient ischemic attack (TIA), and cerebral infarction without residual deficits: Secondary | ICD-10-CM | POA: Insufficient documentation

## 2016-02-06 DIAGNOSIS — I6521 Occlusion and stenosis of right carotid artery: Secondary | ICD-10-CM | POA: Diagnosis not present

## 2016-02-06 DIAGNOSIS — E785 Hyperlipidemia, unspecified: Secondary | ICD-10-CM | POA: Diagnosis not present

## 2016-02-06 DIAGNOSIS — E119 Type 2 diabetes mellitus without complications: Secondary | ICD-10-CM | POA: Insufficient documentation

## 2016-02-06 DIAGNOSIS — R42 Dizziness and giddiness: Secondary | ICD-10-CM | POA: Diagnosis not present

## 2016-02-06 DIAGNOSIS — M21371 Foot drop, right foot: Secondary | ICD-10-CM | POA: Insufficient documentation

## 2016-02-06 DIAGNOSIS — Z87891 Personal history of nicotine dependence: Secondary | ICD-10-CM | POA: Insufficient documentation

## 2016-02-09 ENCOUNTER — Telehealth: Payer: Self-pay | Admitting: Neurology

## 2016-02-09 DIAGNOSIS — E1142 Type 2 diabetes mellitus with diabetic polyneuropathy: Secondary | ICD-10-CM | POA: Diagnosis not present

## 2016-02-09 DIAGNOSIS — M21371 Foot drop, right foot: Secondary | ICD-10-CM | POA: Diagnosis not present

## 2016-02-09 DIAGNOSIS — Z7982 Long term (current) use of aspirin: Secondary | ICD-10-CM | POA: Diagnosis not present

## 2016-02-09 DIAGNOSIS — I69398 Other sequelae of cerebral infarction: Secondary | ICD-10-CM | POA: Diagnosis not present

## 2016-02-09 DIAGNOSIS — M150 Primary generalized (osteo)arthritis: Secondary | ICD-10-CM | POA: Diagnosis not present

## 2016-02-09 DIAGNOSIS — Z7984 Long term (current) use of oral hypoglycemic drugs: Secondary | ICD-10-CM | POA: Diagnosis not present

## 2016-02-09 DIAGNOSIS — R2689 Other abnormalities of gait and mobility: Secondary | ICD-10-CM | POA: Diagnosis not present

## 2016-02-09 DIAGNOSIS — M545 Low back pain: Secondary | ICD-10-CM | POA: Diagnosis not present

## 2016-02-09 DIAGNOSIS — Z9181 History of falling: Secondary | ICD-10-CM | POA: Diagnosis not present

## 2016-02-09 DIAGNOSIS — Z87891 Personal history of nicotine dependence: Secondary | ICD-10-CM | POA: Diagnosis not present

## 2016-02-09 NOTE — Telephone Encounter (Signed)
VM-PT left message she had a missed call from this number/Dawn CB# [phone]

## 2016-02-09 NOTE — Telephone Encounter (Signed)
See next note

## 2016-02-11 ENCOUNTER — Telehealth: Payer: Self-pay | Admitting: Family

## 2016-02-11 ENCOUNTER — Ambulatory Visit
Admission: RE | Admit: 2016-02-11 | Discharge: 2016-02-11 | Disposition: A | Discharge status: Home / Self Care | Payer: Medicare Other | Source: Ambulatory Visit

## 2016-02-11 DIAGNOSIS — Z1231 Encounter for screening mammogram for malignant neoplasm of breast: Secondary | ICD-10-CM | POA: Diagnosis not present

## 2016-02-11 NOTE — Telephone Encounter (Signed)
liji is calling for verbals for 1 week 1, 2 week 4, 1 week 1 for PT to work on gait training, strengthening and balance

## 2016-02-12 NOTE — Telephone Encounter (Signed)
LVM giving verbal ok 

## 2016-02-17 ENCOUNTER — Observation Stay (HOSPITAL_COMMUNITY): Payer: Medicare Other

## 2016-02-17 ENCOUNTER — Inpatient Hospital Stay (HOSPITAL_COMMUNITY)
Admission: EM | Admit: 2016-02-17 | Discharge: 2016-02-20 | DRG: 066 | Disposition: A | Discharge status: Skilled Nursing Facility | Payer: Medicare Other | Attending: Internal Medicine | Admitting: Internal Medicine

## 2016-02-17 ENCOUNTER — Encounter (HOSPITAL_COMMUNITY): Payer: Self-pay | Admitting: Emergency Medicine

## 2016-02-17 ENCOUNTER — Emergency Department (HOSPITAL_COMMUNITY): Payer: Medicare Other

## 2016-02-17 DIAGNOSIS — I6381 Other cerebral infarction due to occlusion or stenosis of small artery: Secondary | ICD-10-CM | POA: Insufficient documentation

## 2016-02-17 DIAGNOSIS — E785 Hyperlipidemia, unspecified: Secondary | ICD-10-CM

## 2016-02-17 DIAGNOSIS — I6330 Cerebral infarction due to thrombosis of unspecified cerebral artery: Secondary | ICD-10-CM

## 2016-02-17 DIAGNOSIS — R278 Other lack of coordination: Secondary | ICD-10-CM | POA: Diagnosis not present

## 2016-02-17 DIAGNOSIS — Z87891 Personal history of nicotine dependence: Secondary | ICD-10-CM | POA: Diagnosis not present

## 2016-02-17 DIAGNOSIS — Z7982 Long term (current) use of aspirin: Secondary | ICD-10-CM | POA: Diagnosis not present

## 2016-02-17 DIAGNOSIS — E1142 Type 2 diabetes mellitus with diabetic polyneuropathy: Secondary | ICD-10-CM

## 2016-02-17 DIAGNOSIS — Z8673 Personal history of transient ischemic attack (TIA), and cerebral infarction without residual deficits: Secondary | ICD-10-CM

## 2016-02-17 DIAGNOSIS — I6322 Cerebral infarction due to unspecified occlusion or stenosis of basilar arteries: Secondary | ICD-10-CM | POA: Diagnosis not present

## 2016-02-17 DIAGNOSIS — Z833 Family history of diabetes mellitus: Secondary | ICD-10-CM

## 2016-02-17 DIAGNOSIS — G629 Polyneuropathy, unspecified: Secondary | ICD-10-CM | POA: Diagnosis not present

## 2016-02-17 DIAGNOSIS — E1140 Type 2 diabetes mellitus with diabetic neuropathy, unspecified: Secondary | ICD-10-CM | POA: Diagnosis not present

## 2016-02-17 DIAGNOSIS — E119 Type 2 diabetes mellitus without complications: Secondary | ICD-10-CM | POA: Diagnosis not present

## 2016-02-17 DIAGNOSIS — I638 Other cerebral infarction: Secondary | ICD-10-CM | POA: Diagnosis not present

## 2016-02-17 DIAGNOSIS — I63219 Cerebral infarction due to unspecified occlusion or stenosis of unspecified vertebral arteries: Secondary | ICD-10-CM | POA: Insufficient documentation

## 2016-02-17 DIAGNOSIS — R2681 Unsteadiness on feet: Secondary | ICD-10-CM | POA: Diagnosis not present

## 2016-02-17 DIAGNOSIS — E1149 Type 2 diabetes mellitus with other diabetic neurological complication: Secondary | ICD-10-CM

## 2016-02-17 DIAGNOSIS — I63212 Cerebral infarction due to unspecified occlusion or stenosis of left vertebral arteries: Secondary | ICD-10-CM | POA: Diagnosis not present

## 2016-02-17 DIAGNOSIS — R000 Tachycardia, unspecified: Secondary | ICD-10-CM | POA: Diagnosis not present

## 2016-02-17 DIAGNOSIS — I69311 Memory deficit following cerebral infarction: Secondary | ICD-10-CM | POA: Diagnosis not present

## 2016-02-17 DIAGNOSIS — R488 Other symbolic dysfunctions: Secondary | ICD-10-CM

## 2016-02-17 DIAGNOSIS — I739 Peripheral vascular disease, unspecified: Secondary | ICD-10-CM

## 2016-02-17 DIAGNOSIS — I639 Cerebral infarction, unspecified: Principal | ICD-10-CM | POA: Diagnosis present

## 2016-02-17 DIAGNOSIS — IMO0002 Reserved for concepts with insufficient information to code with codable children: Secondary | ICD-10-CM

## 2016-02-17 DIAGNOSIS — R410 Disorientation, unspecified: Secondary | ICD-10-CM | POA: Diagnosis not present

## 2016-02-17 DIAGNOSIS — M6281 Muscle weakness (generalized): Secondary | ICD-10-CM | POA: Diagnosis not present

## 2016-02-17 DIAGNOSIS — M62838 Other muscle spasm: Secondary | ICD-10-CM | POA: Diagnosis not present

## 2016-02-17 DIAGNOSIS — E1165 Type 2 diabetes mellitus with hyperglycemia: Secondary | ICD-10-CM | POA: Diagnosis not present

## 2016-02-17 LAB — COMPREHENSIVE METABOLIC PANEL
ALBUMIN: 4.2 g/dL (ref 3.5–5.0)
ALT: 20 U/L (ref 14–54)
ANION GAP: 12 (ref 5–15)
AST: 27 U/L (ref 15–41)
Alkaline Phosphatase: 105 U/L (ref 38–126)
BILIRUBIN TOTAL: 0.5 mg/dL (ref 0.3–1.2)
BUN: 14 mg/dL (ref 6–20)
CHLORIDE: 102 mmol/L (ref 101–111)
CO2: 20 mmol/L — ABNORMAL LOW (ref 22–32)
Calcium: 9.4 mg/dL (ref 8.9–10.3)
Creatinine, Ser: 1.19 mg/dL — ABNORMAL HIGH (ref 0.44–1.00)
GFR calc Af Amer: 54 mL/min — ABNORMAL LOW (ref >60)
GFR calc non Af Amer: 47 mL/min — ABNORMAL LOW (ref >60)
GLUCOSE: 146 mg/dL — AB (ref 65–99)
POTASSIUM: 4.1 mmol/L (ref 3.5–5.1)
SODIUM: 134 mmol/L — AB (ref 135–145)
TOTAL PROTEIN: 7.8 g/dL (ref 6.5–8.1)

## 2016-02-17 LAB — CBC
HEMATOCRIT: 40.8 % (ref 36.0–46.0)
HEMOGLOBIN: 14.4 g/dL (ref 12.0–15.0)
MCH: 30.6 pg (ref 26.0–34.0)
MCHC: 35.3 g/dL (ref 30.0–36.0)
MCV: 86.8 fL (ref 78.0–100.0)
Platelets: 348 10*3/uL (ref 150–400)
RBC: 4.7 MIL/uL (ref 3.87–5.11)
RDW: 12.9 % (ref 11.5–15.5)
WBC: 9.6 10*3/uL (ref 4.0–10.5)

## 2016-02-17 LAB — DIFFERENTIAL
BASOS ABS: 0 10*3/uL (ref 0.0–0.1)
Basophils Relative: 0 %
EOS PCT: 1 %
Eosinophils Absolute: 0.1 10*3/uL (ref 0.0–0.7)
LYMPHS PCT: 22 %
Lymphs Abs: 2.1 10*3/uL (ref 0.7–4.0)
Monocytes Absolute: 0.6 10*3/uL (ref 0.1–1.0)
Monocytes Relative: 6 %
NEUTROS ABS: 6.5 10*3/uL (ref 1.7–7.7)
NEUTROS PCT: 71 %

## 2016-02-17 LAB — URINE MICROSCOPIC-ADD ON

## 2016-02-17 LAB — URINALYSIS, ROUTINE W REFLEX MICROSCOPIC
Glucose, UA: NEGATIVE mg/dL
Hgb urine dipstick: NEGATIVE
Ketones, ur: NEGATIVE mg/dL
NITRITE: POSITIVE — AB
PROTEIN: 30 mg/dL — AB
SPECIFIC GRAVITY, URINE: 1.041 — AB (ref 1.005–1.030)
pH: 5 (ref 5.0–8.0)

## 2016-02-17 LAB — RAPID URINE DRUG SCREEN, HOSP PERFORMED
Amphetamines: NOT DETECTED
BARBITURATES: NOT DETECTED
Benzodiazepines: NOT DETECTED
Cocaine: NOT DETECTED
Opiates: NOT DETECTED
Tetrahydrocannabinol: NOT DETECTED

## 2016-02-17 LAB — I-STAT TROPONIN, ED: TROPONIN I, POC: 0.02 ng/mL (ref 0.00–0.08)

## 2016-02-17 LAB — CBG MONITORING, ED
GLUCOSE-CAPILLARY: 123 mg/dL — AB (ref 65–99)
Glucose-Capillary: 140 mg/dL — ABNORMAL HIGH (ref 65–99)

## 2016-02-17 LAB — ETHANOL

## 2016-02-17 LAB — APTT: APTT: 28 s (ref 24–37)

## 2016-02-17 LAB — PROTIME-INR
INR: 1.14 (ref 0.00–1.49)
PROTHROMBIN TIME: 14.4 s (ref 11.6–15.2)

## 2016-02-17 MED ORDER — TRAMADOL HCL 50 MG PO TABS: 50.0000 mg | ORAL_TABLET | Freq: Two times a day (BID) | ORAL | Status: DC | PRN

## 2016-02-17 MED ORDER — ASPIRIN 325 MG PO TABS
325.0000 mg | ORAL_TABLET | Freq: Every day | ORAL | Status: DC
  Administered 2016-02-17 – 2016-02-19 (×3): 325 mg via ORAL
  Filled 2016-02-17 (×3): qty 1

## 2016-02-17 MED ORDER — CYCLOBENZAPRINE HCL 10 MG PO TABS: 10.0000 mg | ORAL_TABLET | Freq: Three times a day (TID) | ORAL | Status: DC | PRN

## 2016-02-17 MED ORDER — ENOXAPARIN SODIUM 40 MG/0.4ML ~~LOC~~ SOLN
40.0000 mg | SUBCUTANEOUS | Status: DC
  Administered 2016-02-17 – 2016-02-19 (×3): 40 mg via SUBCUTANEOUS
  Filled 2016-02-17 (×4): qty 0.4

## 2016-02-17 MED ORDER — INSULIN ASPART 100 UNIT/ML ~~LOC~~ SOLN: 0.0000 [IU] | Freq: Every day | SUBCUTANEOUS | Status: DC

## 2016-02-17 MED ORDER — ATORVASTATIN CALCIUM 40 MG PO TABS
40.0000 mg | ORAL_TABLET | Freq: Every day | ORAL | Status: DC
  Administered 2016-02-18 – 2016-02-19 (×2): 40 mg via ORAL
  Filled 2016-02-17 (×3): qty 1

## 2016-02-17 MED ORDER — SERTRALINE HCL 100 MG PO TABS
100.0000 mg | ORAL_TABLET | Freq: Every day | ORAL | Status: DC
  Administered 2016-02-18 – 2016-02-19 (×3): 100 mg via ORAL
  Filled 2016-02-17 (×2): qty 1
  Filled 2016-02-17: qty 2
  Filled 2016-02-17: qty 1

## 2016-02-17 MED ORDER — ACETAMINOPHEN 325 MG PO TABS: 650.0000 mg | ORAL_TABLET | ORAL | Status: DC | PRN

## 2016-02-17 MED ORDER — LORAZEPAM 0.5 MG PO TABS
0.5000 mg | ORAL_TABLET | Freq: Four times a day (QID) | ORAL | Status: DC | PRN
  Administered 2016-02-18: 0.5 mg via ORAL
  Filled 2016-02-17: qty 1

## 2016-02-17 MED ORDER — INSULIN ASPART 100 UNIT/ML ~~LOC~~ SOLN
0.0000 [IU] | Freq: Three times a day (TID) | SUBCUTANEOUS | Status: DC
  Administered 2016-02-18 (×3): 2 [IU] via SUBCUTANEOUS
  Administered 2016-02-19: 5 [IU] via SUBCUTANEOUS
  Administered 2016-02-19: 2 [IU] via SUBCUTANEOUS
  Administered 2016-02-20: 5 [IU] via SUBCUTANEOUS
  Administered 2016-02-20: 3 [IU] via SUBCUTANEOUS

## 2016-02-17 MED ORDER — ACETAMINOPHEN 650 MG RE SUPP: 650.0000 mg | RECTAL | Status: DC | PRN

## 2016-02-17 MED ORDER — STROKE: EARLY STAGES OF RECOVERY BOOK
Freq: Once | Status: AC
  Administered 2016-02-17: 21:00:00
  Filled 2016-02-17: qty 1

## 2016-02-17 MED ORDER — ASPIRIN 300 MG RE SUPP: 300.0000 mg | Freq: Every day | RECTAL | Status: DC

## 2016-02-17 NOTE — ED Notes (Signed)
Pt is aware she needs a urine sample. Pt stated she used the restroom, and wasn't aware she needed a sample. Pt says she will notify when she is able to void again.

## 2016-02-17 NOTE — Plan of Care (Signed)
Dr. Posey Pronto reported pt to this NP about pending tests, neuro consult, possible transfer to [facility]. Afterwards, ED MD called this NP to report that MRI was + for stroke and MRA was added on. This NP called neuro, Dr. Nicole Kindred, and discussed pt and findings. Dr. Nicole Kindred wants pt transferred to [facility] for further stroke workup and monitoring. Transfer orders done. EMTALA form completed. ED MD notified that pt should be transferred after MRA is complete.  Clance Boll, NP [facility]

## 2016-02-17 NOTE — H&P (Signed)
[facility] History and Physical   Patient: Sheila [name] PYP:[id number]   PCP: Mauricio Po, FNP DOB: May 25, 1951   DOA: 02/17/2016   DOS: 02/17/2016   DOS: the patient was seen and examined on 02/17/2016  Referring physician: Dr. Lenna Sciara Chief Complaint: Confusion  HPI: Kaiesha Tonner is a 65 y.o. female with Past medical history of diabetes mellitus, prior history of CVA, dyslipidemia, chronic neuropathy. The patient presented with complains of confusion. Patient was brought in by the family member. Her son talked to her yesterday and she was fine. Sometime this morning when the son talked to her she was felt to be having some confusion and when he evaluated her he felt she mistook him to the ER and was brought here. Patient herself does not have any acute complaint. Pt denies any fever, chills, headache, runny nose, neck pain, choking episodes, cough, rash anywhere,  chest pain, palpitation, shortness of breath, orthopnea, PND, pedal edema,  nausea, vomiting, diarrhea, constipation, abdominal pain, acid reflux, active bleeding, black color BM,  burning urination, increase urinary frequency,  fall, trauma or injury,  dizziness, difficulty swallowing, focal neurological deficit.   The patient is coming from home.  At her baseline ambulates with walker ruling And is independent for most of her ADL; manages her medication on her own.  Review of Systems: as mentioned in the history of present illness.  A comprehensive review of the other systems is negative.  Past Medical History  Diagnosis Date  . Arthritis   . Diabetes mellitus     complicated by peripheral neuropathy  . Stroke [facility]) 2015    residual right foot drop  . Hyperlipidemia    Past Surgical History  Procedure Laterality Date  . Cholecystectomy    . Tonsillectomy     Social History:  reports that she quit smoking about 24 years ago. Her smoking use included Cigarettes. She has never used smokeless tobacco.  She reports that she does not drink alcohol or use illicit drugs.  Allergies  Allergen Reactions  . Sulfa Antibiotics   . Paxil [Paroxetine Hcl] Other (See Comments)    Dreams/ nightmares    Family History  Problem Relation Age of Onset  . Diabetes Mother     Deceased  . Diabetes Brother   . Healthy Son     Prior to Admission medications   Medication Sig Start Date End Date Taking? Authorizing Provider  aspirin 81 MG tablet Take 81 mg by mouth daily.   Yes Historical Provider, MD  atorvastatin (LIPITOR) 20 MG tablet Take 1 tablet by mouth  daily 01/05/16  Yes Golden Circle, FNP  cyclobenzaprine (FLEXERIL) 10 MG tablet TAKE ONE TABLET BY MOUTH  THREE TIMES DAILY AS NEEDED FOR MUSCLE SPASM 12/17/15  Yes Golden Circle, FNP  fish oil-omega-3 fatty acids 1000 MG capsule Take 1 g by mouth daily.   Yes Historical Provider, MD  glipiZIDE (GLUCOTROL) 10 MG tablet Take 1 tablet by mouth  daily before breakfast 08/28/15  Yes Golden Circle, FNP  LORazepam (ATIVAN) 0.5 MG tablet TAKE ONE TABLET BY MOUTH TWICE DAILY AS NEEDED FOR ANXIETY 01/22/16  Yes Golden Circle, FNP  magnesium 30 MG tablet Take 30 mg by mouth daily.   Yes Historical Provider, MD  meloxicam (MOBIC) 15 MG tablet Take 1 tablet by mouth  daily 01/05/16  Yes Golden Circle, FNP  metFORMIN (GLUCOPHAGE) 500 MG tablet Take 2 tablets by mouth  daily 01/08/16  Yes  Veryl Speak, FNP  Multiple Vitamin (MULTIVITAMIN) tablet Take 1 tablet by mouth daily.   Yes Historical Provider, MD  sertraline (ZOLOFT) 100 MG tablet Take 1 tablet by mouth  daily 01/08/16  Yes Veryl Speak, FNP  traMADol (ULTRAM) 50 MG tablet TAKE ONE TABLET BY MOUTH EVERY 6 HOURS AS NEEDED FOR PAIN 01/22/16  Yes Veryl Speak, FNP  ACCU-CHEK FASTCLIX LANCETS MISC Use to help check blood sugars twice a day Dx E11.9 11/17/15   Veryl Speak, FNP  Blood Glucose Monitoring Suppl (ACCU-CHEK AVIVA PLUS) W/DEVICE KIT Use as directed once daily 11/17/15    Veryl Speak, FNP  gabapentin (NEURONTIN) 300 MG capsule Take 1 capsule by mouth at  bedtime Patient not taking: Reported on 02/17/2016 12/16/15   Veryl Speak, FNP  glucose blood (ACCU-CHEK AVIVA PLUS) test strip 1 each by Other route 2 (two) times daily. Use to check blood sugars twice a day Dx E11.9 11/17/15   Veryl Speak, FNP  Lancets Misc. (UNISTIK 2 COMFORT) MISC Use 1 device to check blood sugar 1-2 times daily. Use new device for each test. 09/05/15   Veryl Speak, FNP  NONFORMULARY OR COMPOUNDED ITEM AFO for R leg for dropped foot Dx history of cva 06/18/14   Lelon Perla, DO    Physical Exam: Filed Vitals:   02/17/16 1342 02/17/16 1846 02/17/16 1918  BP: 137/79  144/74  Pulse: 121  99  Temp: 98.6 F (37 C) 99.6 F (37.6 C) 98.2 F (36.8 C)  TempSrc: Oral  Oral  Resp: 22  21  SpO2: 95%  99%    General: Alert, Awake and Oriented to Place and Person. Appear in mild distress Eyes: PERRL ENT: Oral Mucosa clear moist. Neck: no JVD Cardiovascular: S1 and S2 Present, no Murmur, Peripheral Pulses Present Respiratory: Bilateral Air entry equal and Decreased,  Clear to Auscultation, no Crackles, no wheezes Abdomen: Bowel Sound present, Soft and no tenderness Skin: no Rash Extremities: no Pedal edema, no calf tenderness Neurologic: Mental status patient appears to have word finding difficulty as she is not able to remember the name of the month, president, also does not remember the date of birth month. Fluency speech normal, attention normal,  Cranial Nerves PERRL, EOM normal and present, facial sensation to light touch present,  Motor strength bilateral equal strength 5/5,  Sensation present to light touch,  Reflexes present difficult to elicit probably due to neuropathy, babinski negative,  Cerebellar test normal finger nose finger.  Labs on Admission:  CBC:  Recent Labs Lab 02/17/16 1446  WBC 9.6  NEUTROABS 6.5  HGB 14.4  HCT 40.8  MCV 86.8  PLT  348    CMP     Component Value Date/Time   NA 134* 02/17/2016 1446   K 4.1 02/17/2016 1446   CL 102 02/17/2016 1446   CO2 20* 02/17/2016 1446   GLUCOSE 146* 02/17/2016 1446   BUN 14 02/17/2016 1446   CREATININE 1.19* 02/17/2016 1446   CALCIUM 9.4 02/17/2016 1446   PROT 7.8 02/17/2016 1446   ALBUMIN 4.2 02/17/2016 1446   AST 27 02/17/2016 1446   ALT 20 02/17/2016 1446   ALKPHOS 105 02/17/2016 1446   BILITOT 0.5 02/17/2016 1446   GFRNONAA 47* 02/17/2016 1446   GFRAA 54* 02/17/2016 1446    No results for input(s): CKTOTAL, CKMB, CKMBINDEX, TROPONINI in the last 168 hours. BNP (last 3 results) No results for input(s): BNP in the last 8760 hours.  ProBNP (last 3 results) No results for input(s): PROBNP in the last 8760 hours.   Radiological Exams on Admission: Dg Chest 2 View  02/17/2016  CLINICAL DATA:  Altered mental status.  Tachycardia.  Weakness. EXAM: CHEST  2 VIEW COMPARISON:  None. FINDINGS: Low lung volumes are present, causing crowding of the pulmonary vasculature. Mildly elevated left hemidiaphragm with linear opacities at the left lung base favoring subsegmental atelectasis. The patient is rotated to the left on today's radiograph, reducing diagnostic sensitivity and specificity. No pleural effusion identified. The right lung appears clear. There is mild enlargement of the cardiopericardial silhouette. Mild thoracic spondylosis. IMPRESSION: 1. Mild enlargement of the cardiopericardial silhouette, without edema. 2. Mildly elevated left hemidiaphragm with suspected subsegmental atelectasis in the left lower lobe. Electronically Signed   By: Van Clines M.D.   On: 02/17/2016 19:49   Ct Head Wo Contrast  02/17/2016  CLINICAL DATA:  Found in apartment disoriented, not making sense during conversation, tachycardia, tachypnea, last seen normal in church on Sunday, history diabetes mellitus, stroke EXAM: CT HEAD WITHOUT CONTRAST TECHNIQUE: Contiguous axial images were  obtained from the base of the skull through the vertex without intravenous contrast. COMPARISON:  None FINDINGS: Generalized atrophy. Normal ventricular morphology. No midline shift or mass effect. Small vessel chronic ischemic changes of deep cerebral white matter. Age-indeterminate lacunar infarct LEFT thalamus. No intracranial hemorrhage, mass lesion, or additional infarction identified. Visualized paranasal sinuses and mastoid air cells clear. Bones unremarkable. IMPRESSION: Atrophy with small vessel chronic ischemic changes of deep cerebral white matter. Age-indeterminate lacunar infarct LEFT thalamus. No additional intracranial abnormalities. Electronically Signed   By: Lavonia Dana M.D.   On: 02/17/2016 14:50   EKG: Independently reviewed. normal sinus rhythm, nonspecific ST and T waves changes.  Assessment/Plan 1. Anomia The patient is having difficulty remembering names as well as her medication. Although she is able to identify objects as well as their purpose. Memory recall is normal. She is able to write her name. CT of the head is unremarkable. We'll get MRI of the brain to rule out any stroke. Stroke swallow evaluation has been normal as well. Aspirin full dose. If the MRI is positive patient will require further stroke workup. At that point we may have been discussed with neurology again. Currently at neurology recommends the patient can remain at [facility].  2.  Hyperlipidemia Continue Lipitor increased to 40. Check LDL in the morning.  3.  DM (diabetes mellitus) ([place]) Holding oral hypoglycemic agent placing her on sliding scale insulin. Carb modified diet once the patient possible swallowing evaluation. Check hemoglobin A1c.  4  Neuropathy ([facility]) Does not take Neurontin although it has been prescribed. Will continue to hold.  5  Peripheral arterial disease ([place]) Documented history of CVA. Patient had a recent vascular carotid which was  unremarkable.  Nutrition: Nothing by mouth pending stroke evaluation cardiac and diabetic diet after that DVT Prophylaxis: subcutaneous Heparin  Advance goals of care discussion: Full code   Consults: Phone consultation with neurology, currently continue to recommend to remain in the [facility] Communication: family was present at bedside, opportunity was given to ask question and all questions were answered satisfactorily at the time of interview. Disposition: Admitted as observation, telemetry unit.  Author: Berle Mull, MD [facility] Pager: [phone] 02/17/2016  If 7PM-7AM, please contact night-coverage [email or web address] Password [email or web address]

## 2016-02-17 NOTE — ED Notes (Signed)
Hospitalist was in room when pt ambulated back from restroom. Pt was unable to void. Hopitalist approved IO cath.

## 2016-02-17 NOTE — Consult Note (Signed)
Admission H&P    Chief Complaint: Altered mental status.  HPI: Sheila [name] is an 65 y.o. female with a history of previous right CVA, diabetes mellitus and hypertension brought to the emergency room with change in mental status. Family members as well as home PT noticed a clear change in mental status with confusion. Patient was last known well 2 days prior to presentation. No focal weakness was noted. There was no dysarthria. There is also no facial droop noticed. She's been taking aspirin daily. CT scan and MRI showed acute left thalamic infarction.   LSN: 02/15/2016 tPA Given: No: Beyond time under for treatment consideration mRankin:  Past Medical History  Diagnosis Date  . Arthritis   . Diabetes mellitus     complicated by peripheral neuropathy  . Stroke [facility]) 2015    residual right foot drop  . Hyperlipidemia     Past Surgical History  Procedure Laterality Date  . Cholecystectomy    . Tonsillectomy      Family History  Problem Relation Age of Onset  . Diabetes Mother     Deceased  . Diabetes Brother   . Healthy Son    Social History:  reports that she quit smoking about 24 years ago. Her smoking use included Cigarettes. She has never used smokeless tobacco. She reports that she does not drink alcohol or use illicit drugs.  Allergies:  Allergies  Allergen Reactions  . Sulfa Antibiotics   . Paxil [Paroxetine Hcl] Other (See Comments)    Dreams/ nightmares    Medications: Patient's preadmission medications were reviewed by me.  ROS: History obtained from patient's son.  General ROS: negative for - chills, fatigue, fever, night sweats, weight gain or weight loss Psychological ROS: negative for - behavioral disorder, hallucinations, memory difficulties, mood swings or suicidal ideation Ophthalmic ROS: negative for - blurry vision, double vision, eye pain or loss of vision ENT ROS: negative for - epistaxis, nasal discharge, oral lesions, sore throat, tinnitus  or vertigo Allergy and Immunology ROS: negative for - hives or itchy/watery eyes Hematological and Lymphatic ROS: negative for - bleeding problems, bruising or swollen lymph nodes Endocrine ROS: negative for - galactorrhea, hair pattern changes, polydipsia/polyuria or temperature intolerance Respiratory ROS: negative for - cough, hemoptysis, shortness of breath or wheezing Cardiovascular ROS: negative for - chest pain, dyspnea on exertion, edema or irregular heartbeat Gastrointestinal ROS: negative for - abdominal pain, diarrhea, hematemesis, nausea/vomiting or stool incontinence Genito-Urinary ROS: negative for - dysuria, hematuria, incontinence or urinary frequency/urgency Musculoskeletal ROS: negative for - joint swelling or muscular weakness Neurological ROS: as noted in HPI Dermatological ROS: negative for rash and skin lesion changes  Physical Examination: Blood pressure 127/60, pulse 93, temperature 98.4 F (36.9 C), temperature source Oral, resp. rate 19, SpO2 100 %.  HEENT-  Normocephalic, no lesions, without obvious abnormality.  Normal external eye and conjunctiva.  Normal TM's bilaterally.  Normal auditory canals and external ears. Normal external nose, mucus membranes and septum.  Normal pharynx. Neck supple with no masses, nodes, nodules or enlargement. Cardiovascular - regular rate and rhythm, S1, S2 normal, no murmur, click, rub or gallop Lungs - chest clear, no wheezing, rales, normal symmetric air entry Abdomen - soft, non-tender; bowel sounds normal; no masses,  no organomegaly Extremities - no joint deformities, effusion, or inflammation and no edema  Neurologic Examination: Mental Status: Alert, disoriented to current age and month, no acute distress.  Speech fluent without evidence of aphasia. Able to follow commands without difficulty.  Cranial Nerves: II-Visual fields were normal. III/IV/VI-Pupils were equal and reacted normally to light. Extraocular movements were  full and conjugate.    V/VII-no facial numbness and no facial weakness. VIII-normal. X-normal speech. XI: trapezius strength/neck flexion strength normal bilaterally XII-midline tongue extension with normal strength. Motor: 5/5 bilaterally with normal tone and bulk Sensory: Normal throughout except for reduced vibratory sensation distally in both lower extremities . Deep Tendon Reflexes:  trace to 1+ and symmetric  . Plantars: Flexor bilaterally Cerebellar: Normal finger-to-nose testing. Carotid auscultation: Normal  Results for orders placed or performed during the hospital encounter of 02/17/16 (from the past 48 hour(s))  CBG monitoring, ED     Status: Abnormal   Collection Time: 02/17/16  2:00 PM  Result Value Ref Range   Glucose-Capillary 140 (H) 65 - 99 mg/dL  Comprehensive metabolic panel     Status: Abnormal   Collection Time: 02/17/16  2:46 PM  Result Value Ref Range   Sodium 134 (L) 135 - 145 mmol/L   Potassium 4.1 3.5 - 5.1 mmol/L   Chloride 102 101 - 111 mmol/L   CO2 20 (L) 22 - 32 mmol/L   Glucose, Bld 146 (H) 65 - 99 mg/dL   BUN 14 6 - 20 mg/dL   Creatinine, Ser 1.19 (H) 0.44 - 1.00 mg/dL   Calcium 9.4 8.9 - 10.3 mg/dL   Total Protein 7.8 6.5 - 8.1 g/dL   Albumin 4.2 3.5 - 5.0 g/dL   AST 27 15 - 41 U/L   ALT 20 14 - 54 U/L   Alkaline Phosphatase 105 38 - 126 U/L   Total Bilirubin 0.5 0.3 - 1.2 mg/dL   GFR calc non Af Amer 47 (L) >60 mL/min   GFR calc Af Amer 54 (L) >60 mL/min    Comment: (NOTE) The eGFR has been calculated using the CKD EPI equation. This calculation has not been validated in all clinical situations. eGFR's persistently <60 mL/min signify possible Chronic Kidney Disease.    Anion gap 12 5 - 15  CBC     Status: None   Collection Time: 02/17/16  2:46 PM  Result Value Ref Range   WBC 9.6 4.0 - 10.5 K/uL   RBC 4.70 3.87 - 5.11 MIL/uL   Hemoglobin 14.4 12.0 - 15.0 g/dL   HCT 40.8 36.0 - 46.0 %   MCV 86.8 78.0 - 100.0 fL   MCH 30.6 26.0 -  34.0 pg   MCHC 35.3 30.0 - 36.0 g/dL   RDW 12.9 11.5 - 15.5 %   Platelets 348 150 - 400 K/uL  Differential     Status: None   Collection Time: 02/17/16  2:46 PM  Result Value Ref Range   Neutrophils Relative % 71 %   Neutro Abs 6.5 1.7 - 7.7 K/uL   Lymphocytes Relative 22 %   Lymphs Abs 2.1 0.7 - 4.0 K/uL   Monocytes Relative 6 %   Monocytes Absolute 0.6 0.1 - 1.0 K/uL   Eosinophils Relative 1 %   Eosinophils Absolute 0.1 0.0 - 0.7 K/uL   Basophils Relative 0 %   Basophils Absolute 0.0 0.0 - 0.1 K/uL  Ethanol     Status: None   Collection Time: 02/17/16  5:48 PM  Result Value Ref Range   Alcohol, Ethyl (B) <5 <5 mg/dL    Comment:        LOWEST DETECTABLE LIMIT FOR SERUM ALCOHOL IS 5 mg/dL FOR MEDICAL PURPOSES ONLY   Protime-INR     Status:  None   Collection Time: 02/17/16  5:48 PM  Result Value Ref Range   Prothrombin Time 14.4 11.6 - 15.2 seconds   INR 1.14 0.00 - 1.49  APTT     Status: None   Collection Time: 02/17/16  5:48 PM  Result Value Ref Range   aPTT 28 24 - 37 seconds  I-stat troponin, ED (not at [facility])     Status: None   Collection Time: 02/17/16  5:59 PM  Result Value Ref Range   Troponin i, poc 0.02 0.00 - 0.08 ng/mL   Comment 3            Comment: Due to the release kinetics of cTnI, a negative result within the first hours of the onset of symptoms does not rule out myocardial infarction with certainty. If myocardial infarction is still suspected, repeat the test at appropriate intervals.   Urine rapid drug screen (hosp performed)not at [facility]     Status: None   Collection Time: 02/17/16  8:13 PM  Result Value Ref Range   Opiates NONE DETECTED NONE DETECTED   Cocaine NONE DETECTED NONE DETECTED   Benzodiazepines NONE DETECTED NONE DETECTED   Amphetamines NONE DETECTED NONE DETECTED   Tetrahydrocannabinol NONE DETECTED NONE DETECTED   Barbiturates NONE DETECTED NONE DETECTED    Comment:        DRUG SCREEN FOR MEDICAL PURPOSES ONLY.  IF  CONFIRMATION IS NEEDED FOR ANY PURPOSE, NOTIFY LAB WITHIN 5 DAYS.        LOWEST DETECTABLE LIMITS FOR URINE DRUG SCREEN Drug Class       Cutoff (ng/mL) Amphetamine      1000 Barbiturate      200 Benzodiazepine   585 Tricyclics       277 Opiates          300 Cocaine          300 THC              50   Urinalysis, Routine w reflex microscopic (not at [facility])     Status: Abnormal   Collection Time: 02/17/16  8:13 PM  Result Value Ref Range   Color, Urine ORANGE (A) YELLOW    Comment: BIOCHEMICALS MAY BE AFFECTED BY COLOR   APPearance CLOUDY (A) CLEAR   Specific Gravity, Urine 1.041 (H) 1.005 - 1.030   pH 5.0 5.0 - 8.0   Glucose, UA NEGATIVE NEGATIVE mg/dL   Hgb urine dipstick NEGATIVE NEGATIVE   Bilirubin Urine MODERATE (A) NEGATIVE   Ketones, ur NEGATIVE NEGATIVE mg/dL   Protein, ur 30 (A) NEGATIVE mg/dL   Nitrite POSITIVE (A) NEGATIVE   Leukocytes, UA MODERATE (A) NEGATIVE  Urine microscopic-add on     Status: Abnormal   Collection Time: 02/17/16  8:13 PM  Result Value Ref Range   Squamous Epithelial / LPF 6-30 (A) NONE SEEN   WBC, UA 6-30 0 - 5 WBC/hpf   RBC / HPF 0-5 0 - 5 RBC/hpf   Bacteria, UA MANY (A) NONE SEEN   Casts HYALINE CASTS (A) NEGATIVE   Dg Chest 2 View  02/17/2016  CLINICAL DATA:  Altered mental status.  Tachycardia.  Weakness. EXAM: CHEST  2 VIEW COMPARISON:  None. FINDINGS: Low lung volumes are present, causing crowding of the pulmonary vasculature. Mildly elevated left hemidiaphragm with linear opacities at the left lung base favoring subsegmental atelectasis. The patient is rotated to the left on today's radiograph, reducing diagnostic sensitivity and specificity. No pleural effusion identified. The right  lung appears clear. There is mild enlargement of the cardiopericardial silhouette. Mild thoracic spondylosis. IMPRESSION: 1. Mild enlargement of the cardiopericardial silhouette, without edema. 2. Mildly elevated left hemidiaphragm with suspected subsegmental  atelectasis in the left lower lobe. Electronically Signed   By: Van Clines M.D.   On: 02/17/2016 19:49   Ct Head Wo Contrast  02/17/2016  CLINICAL DATA:  Found in apartment disoriented, not making sense during conversation, tachycardia, tachypnea, last seen normal in church on Sunday, history diabetes mellitus, stroke EXAM: CT HEAD WITHOUT CONTRAST TECHNIQUE: Contiguous axial images were obtained from the base of the skull through the vertex without intravenous contrast. COMPARISON:  None FINDINGS: Generalized atrophy. Normal ventricular morphology. No midline shift or mass effect. Small vessel chronic ischemic changes of deep cerebral white matter. Age-indeterminate lacunar infarct LEFT thalamus. No intracranial hemorrhage, mass lesion, or additional infarction identified. Visualized paranasal sinuses and mastoid air cells clear. Bones unremarkable. IMPRESSION: Atrophy with small vessel chronic ischemic changes of deep cerebral white matter. Age-indeterminate lacunar infarct LEFT thalamus. No additional intracranial abnormalities. Electronically Signed   By: Lavonia Dana M.D.   On: 02/17/2016 14:50   Mr Jodene Nam Head Wo Contrast  02/17/2016  CLINICAL DATA:  Initial evaluation for acute memory difficulty. History of prior CVA. EXAM: MRI HEAD WITHOUT CONTRAST MRA HEAD WITHOUT CONTRAST TECHNIQUE: Multiplanar, multiecho pulse sequences of the brain and surrounding structures were obtained without intravenous contrast. Angiographic images of the head were obtained using MRA technique without contrast. COMPARISON:  Prior CT from earlier the same day. FINDINGS: MRI HEAD FINDINGS Diffuse prominence of the CSF containing spaces is compatible with generalized age-related cerebral atrophy. Fairly prominent confluent T2/FLAIR hyperintensity within the knee white matter most consistent with chronic small vessel ischemic disease, moderate to advanced in nature. Remote lacunar infarcts within the bilateral thalami, more  prominent on the left. Small remote lacunar infarcts within the left pons as well. Probable additional tiny remote cerebellar infarcts noted as well. There is a 15 mm focus of restricted diffusion within the left ventral medial thalamus, consistent with acute ischemic infarct. No associated hemorrhage or mass effect. There is minimal punctate foci of restricted diffusion within the right thalamus/posterior limb of the right internal capsules well, which may knee also reflect tiny foci of ischemia (series 4, image 22). Subtle signal loss on ADC map present within this region. No other acute infarct. Major intracranial vascular flow voids are maintained. No acute or chronic intracranial hemorrhage. No mass lesion, midline shift, or mass effect. Ventricular prominence related to global parenchymal volume loss present without hydrocephalus. No extra-axial fluid collection. Major dural sinuses are patent. Craniocervical junction within normal limits. Pituitary gland normal.  No acute abnormality about the orbits. Paranasal sinuses are clear. Trace opacity within the left mastoid air cells. Inner ear structures grossly normal. Bone marrow signal intensity within normal limits. No scalp soft tissue abnormality. MRA HEAD FINDINGS ANTERIOR CIRCULATION: Visualized distal cervical segments of the internal carotid arteries are widely patent with antegrade flow. Petrous, cavernous, supraclinoid segments widely patent bilaterally. A1 segments, anterior communicating artery, and anterior cerebral arteries well opacified. M1 segments widely patent without stenosis or occlusion. MCA bifurcations within normal limits. Distal MCA branches well opacified and symmetric. POSTERIOR CIRCULATION: Vertebral arteries are patent to the vertebrobasilar junction. Posterior inferior cerebral arteries patent bilaterally. Basilar artery widely patent. Superior cerebellar arteries well opacified. Posterior cerebral arteries both arise from the  basilar artery and are well opacified to their distal aspects. No aneurysm or vascular malformation.  IMPRESSION: MRI HEAD IMPRESSION: 1. 15 mm acute nonhemorrhagic ischemic infarct within the left ventromedial thalamus. 2. Punctate foci of restricted diffusion within the right thalamus and posterior limb of the right internal capsule, also suspicious for subtle ischemic changes. 3. Generalized cerebral atrophy with advanced chronic microvascular ischemic disease and multiple small remote lacunar infarcts as above. MRA HEAD IMPRESSION: Normal intracranial MRA. No large or proximal arterial branch occlusion. No high-grade or correctable stenosis. Electronically Signed   By: Jeannine Boga M.D.   On: 02/17/2016 22:21   Mr Brain Wo Contrast  02/17/2016  CLINICAL DATA:  Initial evaluation for acute memory difficulty. History of prior CVA. EXAM: MRI HEAD WITHOUT CONTRAST MRA HEAD WITHOUT CONTRAST TECHNIQUE: Multiplanar, multiecho pulse sequences of the brain and surrounding structures were obtained without intravenous contrast. Angiographic images of the head were obtained using MRA technique without contrast. COMPARISON:  Prior CT from earlier the same day. FINDINGS: MRI HEAD FINDINGS Diffuse prominence of the CSF containing spaces is compatible with generalized age-related cerebral atrophy. Fairly prominent confluent T2/FLAIR hyperintensity within the knee white matter most consistent with chronic small vessel ischemic disease, moderate to advanced in nature. Remote lacunar infarcts within the bilateral thalami, more prominent on the left. Small remote lacunar infarcts within the left pons as well. Probable additional tiny remote cerebellar infarcts noted as well. There is a 15 mm focus of restricted diffusion within the left ventral medial thalamus, consistent with acute ischemic infarct. No associated hemorrhage or mass effect. There is minimal punctate foci of restricted diffusion within the right  thalamus/posterior limb of the right internal capsules well, which may knee also reflect tiny foci of ischemia (series 4, image 22). Subtle signal loss on ADC map present within this region. No other acute infarct. Major intracranial vascular flow voids are maintained. No acute or chronic intracranial hemorrhage. No mass lesion, midline shift, or mass effect. Ventricular prominence related to global parenchymal volume loss present without hydrocephalus. No extra-axial fluid collection. Major dural sinuses are patent. Craniocervical junction within normal limits. Pituitary gland normal.  No acute abnormality about the orbits. Paranasal sinuses are clear. Trace opacity within the left mastoid air cells. Inner ear structures grossly normal. Bone marrow signal intensity within normal limits. No scalp soft tissue abnormality. MRA HEAD FINDINGS ANTERIOR CIRCULATION: Visualized distal cervical segments of the internal carotid arteries are widely patent with antegrade flow. Petrous, cavernous, supraclinoid segments widely patent bilaterally. A1 segments, anterior communicating artery, and anterior cerebral arteries well opacified. M1 segments widely patent without stenosis or occlusion. MCA bifurcations within normal limits. Distal MCA branches well opacified and symmetric. POSTERIOR CIRCULATION: Vertebral arteries are patent to the vertebrobasilar junction. Posterior inferior cerebral arteries patent bilaterally. Basilar artery widely patent. Superior cerebellar arteries well opacified. Posterior cerebral arteries both arise from the basilar artery and are well opacified to their distal aspects. No aneurysm or vascular malformation. IMPRESSION: MRI HEAD IMPRESSION: 1. 15 mm acute nonhemorrhagic ischemic infarct within the left ventromedial thalamus. 2. Punctate foci of restricted diffusion within the right thalamus and posterior limb of the right internal capsule, also suspicious for subtle ischemic changes. 3. Generalized  cerebral atrophy with advanced chronic microvascular ischemic disease and multiple small remote lacunar infarcts as above. MRA HEAD IMPRESSION: Normal intracranial MRA. No large or proximal arterial branch occlusion. No high-grade or correctable stenosis. Electronically Signed   By: Jeannine Boga M.D.   On: 02/17/2016 22:21    Assessment: 65 y.o. female with multiple risk factors for stroke as well as  previous stroke presenting with acute left thalamic ischemic infarction.  Stroke Risk Factors - diabetes mellitus and hyperlipidemia  Plan: 1. HgbA1c, fasting lipid panel 2. PT consult, OT consult, Speech consult 3. Echocardiogram 4. Carotid dopplers 5. Prophylactic therapy-Antiplatelet med: Aspirin  6. Risk factor modification 7. Telemetry monitoring  C.R. Nicole Kindred, MD [facility] [phone]   02/17/2016, 11:06 PM

## 2016-02-17 NOTE — ED Notes (Signed)
Informed EDP of patient status and requested EKG or UA, EDP stated he would need to evaluate patient prior to those exams.

## 2016-02-17 NOTE — ED Notes (Signed)
Patient transported to X-ray 

## 2016-02-17 NOTE — ED Notes (Signed)
RN had ED secretary inquire regarding pts bed placement at [facility]. ED Sec stated pts admit order must be reassigned as an admission to [facility] in addition to a transfer in order to be placed on Stroke Unit. ED Sec paging admitting MD.

## 2016-02-17 NOTE — ED Notes (Signed)
Was last seen normal in church on Sunday. Today was found in her apartment disoriented, not making sense during conversation. Patient seems unsure as to why she was brought here. Tachycardic, tachypnic, not on any blood thinners. Denies pain. Patient knows her name, she struggled but was able to remember her birthday. Confused to time, place, and situation. No other neuro deficits elicited in triage assessment.

## 2016-02-17 NOTE — ED Provider Notes (Addendum)
CSN: [id number]     Arrival date & time 02/17/16  1313 History   First MD Initiated Contact with Patient 02/17/16 1656     Chief Complaint  Patient presents with  . Altered Mental Status  . Tachycardia    Level V caveat altered mental status. History is obtained from patient's son and from patient (Consider location/radiation/quality/duration/timing/severity/associated sxs/prior Treatment) HPI patient has been confused and disoriented since yesterday. Her son last spoke with her 2 days ago. She was normal then. It was reported by physical therapist and other family yesterday that she sweats somewhat confused. Her son saw her today and realized that she didn't know date. Patient is asymptomatic and states "I feel fine". No treatment prior to coming here  Past Medical History  Diagnosis Date  . Arthritis   . Diabetes mellitus     complicated by peripheral neuropathy  . Stroke [facility]) 2015    residual right foot drop  . Hyperlipidemia    Past Surgical History  Procedure Laterality Date  . Cholecystectomy    . Tonsillectomy     Family History  Problem Relation Age of Onset  . Diabetes Mother     Deceased  . Diabetes Brother   . Healthy Son    Social History  Substance Use Topics  . Smoking status: Former Smoker    Types: Cigarettes    Quit date: 02/15/1992  . Smokeless tobacco: Never Used  . Alcohol Use: No     Comment: Socially   OB History    No data available     Review of Systems  Unable to perform ROS: Mental status change  Musculoskeletal: Positive for gait problem.       Walks with walker  Allergic/Immunologic: Positive for immunocompromised state.       Diabetic      Allergies  Sulfa antibiotics and Paxil  Home Medications   Prior to Admission medications   Medication Sig Start Date End Date Taking? Authorizing Provider  aspirin 81 MG tablet Take 81 mg by mouth daily.   Yes Historical Provider, MD  atorvastatin (LIPITOR) 20 MG tablet Take 1 tablet by  mouth  daily 01/05/16  Yes Golden Circle, FNP  cyclobenzaprine (FLEXERIL) 10 MG tablet TAKE ONE TABLET BY MOUTH  THREE TIMES DAILY AS NEEDED FOR MUSCLE SPASM 12/17/15  Yes Golden Circle, FNP  fish oil-omega-3 fatty acids 1000 MG capsule Take 1 g by mouth daily.   Yes Historical Provider, MD  glipiZIDE (GLUCOTROL) 10 MG tablet Take 1 tablet by mouth  daily before breakfast 08/28/15  Yes Golden Circle, FNP  LORazepam (ATIVAN) 0.5 MG tablet TAKE ONE TABLET BY MOUTH TWICE DAILY AS NEEDED FOR ANXIETY 01/22/16  Yes Golden Circle, FNP  magnesium 30 MG tablet Take 30 mg by mouth daily.   Yes Historical Provider, MD  meloxicam (MOBIC) 15 MG tablet Take 1 tablet by mouth  daily 01/05/16  Yes Golden Circle, FNP  metFORMIN (GLUCOPHAGE) 500 MG tablet Take 2 tablets by mouth  daily 01/08/16  Yes Golden Circle, FNP  Multiple Vitamin (MULTIVITAMIN) tablet Take 1 tablet by mouth daily.   Yes Historical Provider, MD  sertraline (ZOLOFT) 100 MG tablet Take 1 tablet by mouth  daily 01/08/16  Yes Golden Circle, FNP  traMADol (ULTRAM) 50 MG tablet TAKE ONE TABLET BY MOUTH EVERY 6 HOURS AS NEEDED FOR PAIN 01/22/16  Yes Golden Circle, FNP  ACCU-CHEK FASTCLIX LANCETS MISC Use to help check blood  sugars twice a day Dx E11.9 11/17/15   Golden Circle, FNP  Blood Glucose Monitoring Suppl (ACCU-CHEK AVIVA PLUS) W/DEVICE KIT Use as directed once daily 11/17/15   Golden Circle, FNP  gabapentin (NEURONTIN) 300 MG capsule Take 1 capsule by mouth at  bedtime Patient not taking: Reported on 02/17/2016 12/16/15   Golden Circle, FNP  glucose blood (ACCU-CHEK AVIVA PLUS) test strip 1 each by Other route 2 (two) times daily. Use to check blood sugars twice a day Dx E11.9 11/17/15   Golden Circle, FNP  Lancets Misc. (UNISTIK 2 COMFORT) MISC Use 1 device to check blood sugar 1-2 times daily. Use new device for each test. 09/05/15   Golden Circle, FNP  NONFORMULARY OR COMPOUNDED ITEM AFO for R leg for dropped foot  Dx history of cva 06/18/14   Alferd Apa Lowne, DO   BP 137/79 mmHg  Pulse 121  Temp(Src) 98.6 F (37 C) (Oral)  Resp 22  SpO2 95% Physical Exam  Constitutional: She appears well-developed and well-nourished.  HENT:  Head: Normocephalic and atraumatic.  Eyes: Conjunctivae are normal. Pupils are equal, round, and reactive to light.  Neck: Neck supple. No tracheal deviation present. No thyromegaly present.  Cardiovascular: Regular rhythm.   No murmur heard. Tachycardic heart rate counted at 104 by me  Pulmonary/Chest: Effort normal and breath sounds normal.  Abdominal: Soft. Bowel sounds are normal. She exhibits no distension. There is no tenderness.  Musculoskeletal: Normal range of motion. She exhibits no edema or tenderness.  Neurological: She is alert. Coordination normal.  Oriented to name and hospital does not know month or year. Finger to nose normal DTR symmetric bilaterally at knee jerk and ankle jerk and biceps post downward going bilaterally  Skin: Skin is warm and dry. No rash noted.  Psychiatric: She has a normal mood and affect.  Nursing note and vitals reviewed.   ED Course  Procedures (including critical care time) Labs Review Labs Reviewed  COMPREHENSIVE METABOLIC PANEL - Abnormal; Notable for the following:    Sodium 134 (*)    CO2 20 (*)    Glucose, Bld 146 (*)    Creatinine, Ser 1.19 (*)    GFR calc non Af Amer 47 (*)    GFR calc Af Amer 54 (*)    All other components within normal limits  CBG MONITORING, ED - Abnormal; Notable for the following:    Glucose-Capillary 140 (*)    All other components within normal limits  CBC    Imaging Review Ct Head Wo Contrast  02/17/2016  CLINICAL DATA:  Found in apartment disoriented, not making sense during conversation, tachycardia, tachypnea, last seen normal in church on Sunday, history diabetes mellitus, stroke EXAM: CT HEAD WITHOUT CONTRAST TECHNIQUE: Contiguous axial images were obtained from the base of the  skull through the vertex without intravenous contrast. COMPARISON:  None FINDINGS: Generalized atrophy. Normal ventricular morphology. No midline shift or mass effect. Small vessel chronic ischemic changes of deep cerebral white matter. Age-indeterminate lacunar infarct LEFT thalamus. No intracranial hemorrhage, mass lesion, or additional infarction identified. Visualized paranasal sinuses and mastoid air cells clear. Bones unremarkable. IMPRESSION: Atrophy with small vessel chronic ischemic changes of deep cerebral white matter. Age-indeterminate lacunar infarct LEFT thalamus. No additional intracranial abnormalities. Electronically Signed   By: Lavonia Dana M.D.   On: 02/17/2016 14:50   I have personally reviewed and evaluated these images and lab results as part of my medical decision-making.   EKG Interpretation  Date/Time:  Tuesday February 17 2016 17:29:45 EST Ventricular Rate:  102 PR Interval:  126 QRS Duration: 90 QT Interval:  326 QTC Calculation: 425 R Axis:   13 Text Interpretation:  Sinus tachycardia Multiple premature complexes, vent  & supraven No old tracing to compare Confirmed by Winfred Leeds  MD, Anyah Swallow  [phone]) on 02/17/2016 5:39:37 PM     Results for orders placed or performed during the hospital encounter of 02/17/16  Comprehensive metabolic panel  Result Value Ref Range   Sodium 134 (L) 135 - 145 mmol/L   Potassium 4.1 3.5 - 5.1 mmol/L   Chloride 102 101 - 111 mmol/L   CO2 20 (L) 22 - 32 mmol/L   Glucose, Bld 146 (H) 65 - 99 mg/dL   BUN 14 6 - 20 mg/dL   Creatinine, Ser 1.19 (H) 0.44 - 1.00 mg/dL   Calcium 9.4 8.9 - 10.3 mg/dL   Total Protein 7.8 6.5 - 8.1 g/dL   Albumin 4.2 3.5 - 5.0 g/dL   AST 27 15 - 41 U/L   ALT 20 14 - 54 U/L   Alkaline Phosphatase 105 38 - 126 U/L   Total Bilirubin 0.5 0.3 - 1.2 mg/dL   GFR calc non Af Amer 47 (L) >60 mL/min   GFR calc Af Amer 54 (L) >60 mL/min   Anion gap 12 5 - 15  CBC  Result Value Ref Range   WBC 9.6 4.0 - 10.5 K/uL    RBC 4.70 3.87 - 5.11 MIL/uL   Hemoglobin 14.4 12.0 - 15.0 g/dL   HCT 40.8 36.0 - 46.0 %   MCV 86.8 78.0 - 100.0 fL   MCH 30.6 26.0 - 34.0 pg   MCHC 35.3 30.0 - 36.0 g/dL   RDW 12.9 11.5 - 15.5 %   Platelets 348 150 - 400 K/uL  Ethanol  Result Value Ref Range   Alcohol, Ethyl (B) <5 <5 mg/dL  Protime-INR  Result Value Ref Range   Prothrombin Time 14.4 11.6 - 15.2 seconds   INR 1.14 0.00 - 1.49  APTT  Result Value Ref Range   aPTT 28 24 - 37 seconds  Differential  Result Value Ref Range   Neutrophils Relative % 71 %   Neutro Abs 6.5 1.7 - 7.7 K/uL   Lymphocytes Relative 22 %   Lymphs Abs 2.1 0.7 - 4.0 K/uL   Monocytes Relative 6 %   Monocytes Absolute 0.6 0.1 - 1.0 K/uL   Eosinophils Relative 1 %   Eosinophils Absolute 0.1 0.0 - 0.7 K/uL   Basophils Relative 0 %   Basophils Absolute 0.0 0.0 - 0.1 K/uL  CBG monitoring, ED  Result Value Ref Range   Glucose-Capillary 140 (H) 65 - 99 mg/dL  I-stat troponin, ED (not at [facility], [facility])  Result Value Ref Range   Troponin i, poc 0.02 0.00 - 0.08 ng/mL   Comment 3           Ct Head Wo Contrast  02/17/2016  CLINICAL DATA:  Found in apartment disoriented, not making sense during conversation, tachycardia, tachypnea, last seen normal in church on Sunday, history diabetes mellitus, stroke EXAM: CT HEAD WITHOUT CONTRAST TECHNIQUE: Contiguous axial images were obtained from the base of the skull through the vertex without intravenous contrast. COMPARISON:  None FINDINGS: Generalized atrophy. Normal ventricular morphology. No midline shift or mass effect. Small vessel chronic ischemic changes of deep cerebral white matter. Age-indeterminate lacunar infarct LEFT thalamus. No intracranial hemorrhage, mass lesion, or additional infarction identified.  Visualized paranasal sinuses and mastoid air cells clear. Bones unremarkable. IMPRESSION: Atrophy with small vessel chronic ischemic changes of deep cerebral white matter. Age-indeterminate lacunar  infarct LEFT thalamus. No additional intracranial abnormalities. Electronically Signed   By: Lavonia Dana M.D.   On: 02/17/2016 14:50   Mm Screening Breast Tomo Bilateral  02/13/2016  CLINICAL DATA:  Screening. EXAM: DIGITAL SCREENING BILATERAL MAMMOGRAM WITH 3D TOMO WITH CAD COMPARISON:  Previous exam(s). ACR Breast Density Category b: There are scattered areas of fibroglandular density. FINDINGS: There are no findings suspicious for malignancy. Images were processed with CAD. IMPRESSION: No mammographic evidence of malignancy. A result letter of this screening mammogram will be mailed directly to the patient. RECOMMENDATION: Screening mammogram in one year. (Code:SM-B-01Y) BI-RADS CATEGORY  1: Negative. Electronically Signed   By: Ammie Ferrier M.D.   On: 02/13/2016 15:01     MDM  I spoke with Dr.Lindzem via telephone he suggests MRI of brain, and inpatient stay with medical service. If MRI shows stroke , he suggests formal neurologic consultation and full stroke evaluation. If MRI shows no new acute stroke she does not require further stroke workup. Dr. Posey Pronto consulted and saw patient in ED and arrange for inpatient stay Diagnosis #1acute delirium #2 hyperglycemia Final diagnoses:  None        Orlie Dakin, MD 02/17/16 1853  Addendum I was notified by MRI tech the patient had acute stroke seen on MRI. I contacted hospitalist who consulted neurology and made arrangements for transfer to [facility] Diagnoses #1 acute stroke #2 hyperglycemia  Orlie Dakin, MD 02/17/16 2101

## 2016-02-17 NOTE — ED Notes (Signed)
Patient transported to MRI 

## 2016-02-18 DIAGNOSIS — I63212 Cerebral infarction due to unspecified occlusion or stenosis of left vertebral arteries: Secondary | ICD-10-CM

## 2016-02-18 DIAGNOSIS — I6322 Cerebral infarction due to unspecified occlusion or stenosis of basilar arteries: Secondary | ICD-10-CM

## 2016-02-18 LAB — GLUCOSE, CAPILLARY
GLUCOSE-CAPILLARY: 144 mg/dL — AB (ref 65–99)
GLUCOSE-CAPILLARY: 142 mg/dL — AB (ref 65–99)
GLUCOSE-CAPILLARY: 127 mg/dL — AB (ref 65–99)
GLUCOSE-CAPILLARY: 144 mg/dL — AB (ref 65–99)
Glucose-Capillary: 112 mg/dL — ABNORMAL HIGH (ref 65–99)

## 2016-02-18 MED ORDER — ONDANSETRON HCL 4 MG/2ML IJ SOLN: 4.0000 mg | Freq: Four times a day (QID) | INTRAMUSCULAR | Status: DC | PRN

## 2016-02-18 NOTE — Progress Notes (Signed)
Pt with increased confusion. Provider gherghe nofitied.

## 2016-02-18 NOTE — Progress Notes (Signed)
Dr. Renne Crigler paged to make aware of UA results. Also to clarify orders for carotid doppler and echo.

## 2016-02-18 NOTE — Progress Notes (Signed)
STROKE TEAM PROGRESS NOTE   HISTORY OF PRESENT ILLNESS Sheila [name] is an 65 y.o. female with a history of previous right CVA, diabetes mellitus and hypertension brought to the emergency room with change in mental status. Family members as well as home PT noticed a clear change in mental status with confusion. Patient was last known well 2 days prior to presentation. No focal weakness was noted. There was no dysarthria. There is also no facial droop noticed. She's been taking aspirin daily. CT scan and MRI showed acute left thalamic infarction.   LSN: 02/15/2016 tPA Given: No: Beyond time under for treatment consideration mRankin   SUBJECTIVE (INTERVAL HISTORY) No family at the bedside. Patient feels she is doing well.   OBJECTIVE Temp:  [97.7 F (36.5 C)-99.6 F (37.6 C)] 98.5 F (36.9 C) (02/22 1400) Pulse Rate:  [85-99] 95 (02/22 1400) Cardiac Rhythm:  [-] Normal sinus rhythm (02/22 0704) Resp:  [14-23] 16 (02/22 1400) BP: (108-144)/(54-89) 128/69 mmHg (02/22 1400) SpO2:  [95 %-100 %] 99 % (02/22 1400) Weight:  [77.111 kg (170 lb)] 77.111 kg (170 lb) (02/22 0157)  CBC:  Recent Labs Lab 02/17/16 1446  WBC 9.6  NEUTROABS 6.5  HGB 14.4  HCT 40.8  MCV 86.8  PLT 0000000    Basic Metabolic Panel:  Recent Labs Lab 02/17/16 1446  NA 134*  K 4.1  CL 102  CO2 20*  GLUCOSE 146*  BUN 14  CREATININE 1.19*  CALCIUM 9.4    Lipid Panel:    Component Value Date/Time   CHOL 145 01/22/2014 1141   TRIG 91.0 01/22/2014 1141   HDL 56.20 01/22/2014 1141   CHOLHDL 3 01/22/2014 1141   VLDL 18.2 01/22/2014 1141   LDLCALC 71 01/22/2014 1141   HgbA1c:  Lab Results  Component Value Date   HGBA1C 6.5 09/05/2015   Urine Drug Screen:    Component Value Date/Time   LABOPIA NONE DETECTED 02/17/2016 2013   COCAINSCRNUR NONE DETECTED 02/17/2016 2013   LABBENZ NONE DETECTED 02/17/2016 2013   AMPHETMU NONE DETECTED 02/17/2016 2013   THCU NONE DETECTED 02/17/2016 2013   LABBARB  NONE DETECTED 02/17/2016 2013      IMAGING  Dg Chest 2 View 02/17/2016   1. Mild enlargement of the cardiopericardial silhouette, without edema.  2. Mildly elevated left hemidiaphragm with suspected subsegmental atelectasis in the left lower lobe.    Ct Head Wo Contrast 02/17/2016   Atrophy with small vessel chronic ischemic changes of deep cerebral white matter. Age-indeterminate lacunar infarct LEFT thalamus. No additional intracranial abnormalities.    Mr Jodene Nam Head Wo Contrast 02/17/2016    MRI HEAD IMPRESSION:  1. 15 mm acute nonhemorrhagic ischemic infarct within the left ventromedial thalamus.  2. Punctate foci of restricted diffusion within the right thalamus and posterior limb of the right internal capsule, also suspicious for subtle ischemic changes.  3. Generalized cerebral atrophy with advanced chronic microvascular ischemic disease and multiple small remote lacunar infarcts as above.   MRA HEAD IMPRESSION:  Normal intracranial MRA. No large or proximal arterial branch occlusion. No high-grade or correctable stenosis.    PHYSICAL EXAM Physical exam: Exam: Gen: NAD Eyes: anicteric sclerae, moist conjunctivae                    CV: no MRG, no carotid bruits, no peripheral edema Mental Status: Alert, follows commands, oriented to self and month but not year.  Neuro: Detailed Neurologic Exam  Speech:    No  aphasia, no dysarthria  Cranial Nerves:    The pupils are equal, round, and reactive to light.. Attempted, Fundi not visualized.  EOMI. No gaze preference. Visual fields full. Face symmetric, Tongue midline. Hearing intact to voice. Shoulder shrug intact  Motor Observation:    no involuntary movements noted. Tone appears normal.     Strength:    5/5     Sensation:  Intact to LT  Plantars downgoing.   ASSESSMENT/PLAN Ms. Sheila [name] is a 65 y.o. female with history of diabetes mellitus, hypertension, previous stroke, and hyperlipidemia presenting  with a change in mental status. She did not receive IV t-PA due to late presentation.  Stroke: Dominant infarct felt secondary to small vessel disease.  Resultant  Deficits resolved.   MRI 15 mm acute nonhemorrhagic ischemic infarct within the left ventromedial thalamus.    MRA  Normal intracranial MRA  Carotid Doppler - pending  2D Echo  pending  LDL 71  HgbA1c pending  VTE prophylaxis - Lovenox  Diet heart healthy/carb modified Room service appropriate?: Yes; Fluid consistency:: Thin  aspirin 81 mg daily prior to admission, now on aspirin 325 mg daily. Consider changing to Plavix.  Patient counseled to be compliant with her antithrombotic medications  Ongoing aggressive stroke risk factor management  Therapy recommendations:  Consider SNF  Disposition:  Pending  Hypertension  Mildly low blood pressures at times.  Permissive hypertension (OK if < 220/120) but gradually normalize in 5-7 days   Hyperlipidemia  Home meds:  Lipitor 20 mg daily prior to admission  LDL 71, goal < 70  Now on Lipitor 40 mg daily  Continue statin at discharge  Diabetes  HgbA1c pending, goal < 7.0  Controlled  Other Stroke Risk Factors  Advanced age  Cigarette smoker, quit smoking 24 years ago   Obesity, Body mass index is 31.09 kg/(m^2).   Hx stroke/TIA   Other Active Problems  Mildly elevated creatinine 1.19   Personally examined patient and images, and have participated in and made any corrections needed to history, physical, neuro exam,assessment and plan as stated above.  I have personally obtained the history, evaluated lab date, reviewed imaging studies and agree with radiology interpretations.    Sarina Ill, MD Stroke Neurology [phone] [facility]      To contact Stroke Continuity provider, please refer to [email or web address] After hours, contact General Neurology

## 2016-02-18 NOTE — Progress Notes (Signed)
Nutrition Brief Note  Patient identified on the Malnutrition Screening Tool (MST) Report  Wt Readings from Last 15 Encounters:  02/18/16 170 lb (77.111 kg)  01/30/16 167 lb (75.751 kg)  12/16/15 166 lb 12 oz (75.637 kg)  12/09/15 171 lb (77.565 kg)  12/03/15 175 lb (79.379 kg)  09/05/15 175 lb (79.379 kg)  06/12/15 172 lb (78.019 kg)  03/12/15 171 lb (77.565 kg)  02/18/15 175 lb 6.4 oz (79.561 kg)  01/02/15 170 lb 12.8 oz (77.474 kg)  10/08/14 169 lb 6.4 oz (76.839 kg)  06/13/14 143 lb (64.864 kg)  04/09/14 143 lb (64.864 kg)  03/12/14 145 lb (65.772 kg)  01/22/14 148 lb 3.2 oz (67.223 kg)   65 y.o. female with multiple risk factors for stroke as well as previous stroke presenting with acute left thalamic ischemic infarction.  Spoke with pt at bedside, who reports good appetite PTA. She generally consumes 3 meals per day. She denies any weight loss. Per wt hx, wt has ranged between 170-175# over the past year.   Nutrition-Focused physical exam completed. Findings are no fat depletion, mild muscle depletion, and no edema. Mild muscle depletion noted in lower left extremities only. Per pt, "I've always had that".   Body mass index is 31.09 kg/(m^2). Patient meets criteria for obesity, class I based on current BMI.   Current diet order is Heart Healthy/ Carb Modified, patient is consuming approximately n/a% of meals at this time. Labs and medications reviewed.   No nutrition interventions warranted at this time. If nutrition issues arise, please consult RD.   Viggo Perko A. Jimmye Norman, RD, LDN, CDE Pager: [phone] After hours Pager: [phone]

## 2016-02-18 NOTE — Progress Notes (Signed)
PROGRESS NOTE  Sheila [name] [id number] DOB: September 01, 1951 DOA: 02/17/2016 PCP: Mauricio Po, FNP Outpatient Specialists:    LOS: 1 day   Brief Narrative: 65 year old female with history of diabetes, prior CVA, hyperlipidemia, admitted on 2/21 with confusion and memory problems, with MRI positive for CVA.  Assessment & Plan: Principal Problem:   Anomia Active Problems:   Hyperlipidemia   DM (diabetes mellitus) ([place])   Neuropathy ([place])   Peripheral arterial disease ([place])   Stroke [facility])   CVA / Anomia - neurology consulted, appreciate input, MRI positive for stroke. Discussed with neurology this morning - Obtain 2-D echo and carotid Dopplers - Continue aspirin - Mental status seems to be improved today and back to baseline per family  Hyperlipidemia - Continue statin   Bacteriuria  - Patient is afebrile, without leukocytosis, without any urinary symptoms, unlikely to represent a true urinary tract infection, hold antibiotics for now.  Diabetes mellitus - Continue sliding scale insulin - Hemoglobin A1c pending   Tobacco abuse - Consult for cessation today  DVT prophylaxis: Lovenox  Code Status: Full code  Family Communication: Discussed with son and daughter-in-law at bedside  Disposition Plan: Home 1-2 days  Barriers for discharge: CVA workup   Consultants:   Neurology   Procedures:   2D echo: Pending   Antimicrobials:  None    Subjective: - Patient feeling "wonderful", denies any chest pain for this of breath. Family is at bedside and states that she is her normal self.   Objective: Filed Vitals:   02/18/16 0859 02/18/16 1058 02/18/16 1200 02/18/16 1400  BP: 124/88 119/63 132/79 128/69  Pulse: 93 85 90 95  Temp: 97.7 F (36.5 C) 98.2 F (36.8 C) 97.8 F (36.6 C) 98.5 F (36.9 C)  TempSrc: Oral Oral Oral Oral  Resp: 17 16 16 16   Height:      Weight:      SpO2: 100%  97% 99%    Intake/Output Summary (Last 24 hours) at 02/18/16  1505 Last data filed at 02/18/16 0905  Gross per 24 hour  Intake    100 ml  Output      0 ml  Net    100 ml   Filed Weights   02/18/16 0157  Weight: 77.111 kg (170 lb)    Examination: BP 128/69 mmHg  Pulse 95  Temp(Src) 98.5 F (36.9 C) (Oral)  Resp 16  Ht 5\' 2"  (1.575 m)  Wt 77.111 kg (170 lb)  BMI 31.09 kg/m2  SpO2 99% General exam: NAD Respiratory system: Clear. No increased work of breathing. No wheezing, no crackles Cardiovascular system: regular rate and rhythm, no murmurs, gallops. No JVD. No peripheral edema.  Gastrointestinal system: Abdomen is nondistended, soft and nontender. Normal bowel sounds heard. Central nervous system: AxOx3. No focal deficits Extremities: No clubbing/cyanosis Skin: no rashes   Data Reviewed: I have personally reviewed following labs and imaging studies  CBC:  Recent Labs Lab 02/17/16 1446  WBC 9.6  NEUTROABS 6.5  HGB 14.4  HCT 40.8  MCV 86.8  PLT 0000000   Basic Metabolic Panel:  Recent Labs Lab 02/17/16 1446  NA 134*  K 4.1  CL 102  CO2 20*  GLUCOSE 146*  BUN 14  CREATININE 1.19*  CALCIUM 9.4   GFR: Estimated Creatinine Clearance: 45.3 mL/min (by C-G formula based on Cr of 1.19). Liver Function Tests:  Recent Labs Lab 02/17/16 1446  AST 27  ALT 20  ALKPHOS 105  BILITOT 0.5  PROT 7.8  ALBUMIN 4.2   No results for input(s): LIPASE, AMYLASE in the last 168 hours. No results for input(s): AMMONIA in the last 168 hours. Coagulation Profile:  Recent Labs Lab 02/17/16 1748  INR 1.14   Cardiac Enzymes: No results for input(s): CKTOTAL, CKMB, CKMBINDEX, TROPONINI in the last 168 hours. BNP (last 3 results) No results for input(s): PROBNP in the last 8760 hours. HbA1C: No results for input(s): HGBA1C in the last 72 hours. CBG:  Recent Labs Lab 02/17/16 1400 02/17/16 2315 02/18/16 0305 02/18/16 0838 02/18/16 1153  GLUCAP 140* 123* 144* 142* 127*   Lipid Profile: No results for input(s): CHOL,  HDL, LDLCALC, TRIG, CHOLHDL, LDLDIRECT in the last 72 hours. Thyroid Function Tests: No results for input(s): TSH, T4TOTAL, FREET4, T3FREE, THYROIDAB in the last 72 hours. Anemia Panel: No results for input(s): VITAMINB12, FOLATE, FERRITIN, TIBC, IRON, RETICCTPCT in the last 72 hours. Urine analysis:    Component Value Date/Time   COLORURINE ORANGE* 02/17/2016 2013   APPEARANCEUR CLOUDY* 02/17/2016 2013   LABSPEC 1.041* 02/17/2016 2013   PHURINE 5.0 02/17/2016 2013   GLUCOSEU NEGATIVE 02/17/2016 2013   HGBUR NEGATIVE 02/17/2016 2013   BILIRUBINUR MODERATE* 02/17/2016 2013   BILIRUBINUR Neg 01/22/2014 [place] 02/17/2016 2013   PROTEINUR 30* 02/17/2016 2013   PROTEINUR Neg 01/22/2014 1656   UROBILINOGEN 0.2 01/22/2014 1656   NITRITE POSITIVE* 02/17/2016 2013   NITRITE Neg 01/22/2014 1656   LEUKOCYTESUR MODERATE* 02/17/2016 2013   Sepsis Labs: @LABRCNTIP (procalcitonin:4,lacticidven:4)  )No results found for this or any previous visit (from the past 240 hour(s)).    Radiology Studies: Dg Chest 2 View  02/17/2016  CLINICAL DATA:  Altered mental status.  Tachycardia.  Weakness. EXAM: CHEST  2 VIEW COMPARISON:  None. FINDINGS: Low lung volumes are present, causing crowding of the pulmonary vasculature. Mildly elevated left hemidiaphragm with linear opacities at the left lung base favoring subsegmental atelectasis. The patient is rotated to the left on today's radiograph, reducing diagnostic sensitivity and specificity. No pleural effusion identified. The right lung appears clear. There is mild enlargement of the cardiopericardial silhouette. Mild thoracic spondylosis. IMPRESSION: 1. Mild enlargement of the cardiopericardial silhouette, without edema. 2. Mildly elevated left hemidiaphragm with suspected subsegmental atelectasis in the left lower lobe. Electronically Signed   By: Van Clines M.D.   On: 02/17/2016 19:49   Ct Head Wo Contrast  02/17/2016  CLINICAL  DATA:  Found in apartment disoriented, not making sense during conversation, tachycardia, tachypnea, last seen normal in church on Sunday, history diabetes mellitus, stroke EXAM: CT HEAD WITHOUT CONTRAST TECHNIQUE: Contiguous axial images were obtained from the base of the skull through the vertex without intravenous contrast. COMPARISON:  None FINDINGS: Generalized atrophy. Normal ventricular morphology. No midline shift or mass effect. Small vessel chronic ischemic changes of deep cerebral white matter. Age-indeterminate lacunar infarct LEFT thalamus. No intracranial hemorrhage, mass lesion, or additional infarction identified. Visualized paranasal sinuses and mastoid air cells clear. Bones unremarkable. IMPRESSION: Atrophy with small vessel chronic ischemic changes of deep cerebral white matter. Age-indeterminate lacunar infarct LEFT thalamus. No additional intracranial abnormalities. Electronically Signed   By: Lavonia Dana M.D.   On: 02/17/2016 14:50   Mr Jodene Nam Head Wo Contrast  02/17/2016  CLINICAL DATA:  Initial evaluation for acute memory difficulty. History of prior CVA. EXAM: MRI HEAD WITHOUT CONTRAST MRA HEAD WITHOUT CONTRAST TECHNIQUE: Multiplanar, multiecho pulse sequences of the brain and surrounding structures were obtained without intravenous contrast. Angiographic images of the head were obtained  using MRA technique without contrast. COMPARISON:  Prior CT from earlier the same day. FINDINGS: MRI HEAD FINDINGS Diffuse prominence of the CSF containing spaces is compatible with generalized age-related cerebral atrophy. Fairly prominent confluent T2/FLAIR hyperintensity within the knee white matter most consistent with chronic small vessel ischemic disease, moderate to advanced in nature. Remote lacunar infarcts within the bilateral thalami, more prominent on the left. Small remote lacunar infarcts within the left pons as well. Probable additional tiny remote cerebellar infarcts noted as well. There is  a 15 mm focus of restricted diffusion within the left ventral medial thalamus, consistent with acute ischemic infarct. No associated hemorrhage or mass effect. There is minimal punctate foci of restricted diffusion within the right thalamus/posterior limb of the right internal capsules well, which may knee also reflect tiny foci of ischemia (series 4, image 22). Subtle signal loss on ADC map present within this region. No other acute infarct. Major intracranial vascular flow voids are maintained. No acute or chronic intracranial hemorrhage. No mass lesion, midline shift, or mass effect. Ventricular prominence related to global parenchymal volume loss present without hydrocephalus. No extra-axial fluid collection. Major dural sinuses are patent. Craniocervical junction within normal limits. Pituitary gland normal.  No acute abnormality about the orbits. Paranasal sinuses are clear. Trace opacity within the left mastoid air cells. Inner ear structures grossly normal. Bone marrow signal intensity within normal limits. No scalp soft tissue abnormality. MRA HEAD FINDINGS ANTERIOR CIRCULATION: Visualized distal cervical segments of the internal carotid arteries are widely patent with antegrade flow. Petrous, cavernous, supraclinoid segments widely patent bilaterally. A1 segments, anterior communicating artery, and anterior cerebral arteries well opacified. M1 segments widely patent without stenosis or occlusion. MCA bifurcations within normal limits. Distal MCA branches well opacified and symmetric. POSTERIOR CIRCULATION: Vertebral arteries are patent to the vertebrobasilar junction. Posterior inferior cerebral arteries patent bilaterally. Basilar artery widely patent. Superior cerebellar arteries well opacified. Posterior cerebral arteries both arise from the basilar artery and are well opacified to their distal aspects. No aneurysm or vascular malformation. IMPRESSION: MRI HEAD IMPRESSION: 1. 15 mm acute nonhemorrhagic  ischemic infarct within the left ventromedial thalamus. 2. Punctate foci of restricted diffusion within the right thalamus and posterior limb of the right internal capsule, also suspicious for subtle ischemic changes. 3. Generalized cerebral atrophy with advanced chronic microvascular ischemic disease and multiple small remote lacunar infarcts as above. MRA HEAD IMPRESSION: Normal intracranial MRA. No large or proximal arterial branch occlusion. No high-grade or correctable stenosis. Electronically Signed   By: Jeannine Boga M.D.   On: 02/17/2016 22:21   Mr Brain Wo Contrast  02/17/2016  CLINICAL DATA:  Initial evaluation for acute memory difficulty. History of prior CVA. EXAM: MRI HEAD WITHOUT CONTRAST MRA HEAD WITHOUT CONTRAST TECHNIQUE: Multiplanar, multiecho pulse sequences of the brain and surrounding structures were obtained without intravenous contrast. Angiographic images of the head were obtained using MRA technique without contrast. COMPARISON:  Prior CT from earlier the same day. FINDINGS: MRI HEAD FINDINGS Diffuse prominence of the CSF containing spaces is compatible with generalized age-related cerebral atrophy. Fairly prominent confluent T2/FLAIR hyperintensity within the knee white matter most consistent with chronic small vessel ischemic disease, moderate to advanced in nature. Remote lacunar infarcts within the bilateral thalami, more prominent on the left. Small remote lacunar infarcts within the left pons as well. Probable additional tiny remote cerebellar infarcts noted as well. There is a 15 mm focus of restricted diffusion within the left ventral medial thalamus, consistent with acute ischemic infarct.  No associated hemorrhage or mass effect. There is minimal punctate foci of restricted diffusion within the right thalamus/posterior limb of the right internal capsules well, which may knee also reflect tiny foci of ischemia (series 4, image 22). Subtle signal loss on ADC map present  within this region. No other acute infarct. Major intracranial vascular flow voids are maintained. No acute or chronic intracranial hemorrhage. No mass lesion, midline shift, or mass effect. Ventricular prominence related to global parenchymal volume loss present without hydrocephalus. No extra-axial fluid collection. Major dural sinuses are patent. Craniocervical junction within normal limits. Pituitary gland normal.  No acute abnormality about the orbits. Paranasal sinuses are clear. Trace opacity within the left mastoid air cells. Inner ear structures grossly normal. Bone marrow signal intensity within normal limits. No scalp soft tissue abnormality. MRA HEAD FINDINGS ANTERIOR CIRCULATION: Visualized distal cervical segments of the internal carotid arteries are widely patent with antegrade flow. Petrous, cavernous, supraclinoid segments widely patent bilaterally. A1 segments, anterior communicating artery, and anterior cerebral arteries well opacified. M1 segments widely patent without stenosis or occlusion. MCA bifurcations within normal limits. Distal MCA branches well opacified and symmetric. POSTERIOR CIRCULATION: Vertebral arteries are patent to the vertebrobasilar junction. Posterior inferior cerebral arteries patent bilaterally. Basilar artery widely patent. Superior cerebellar arteries well opacified. Posterior cerebral arteries both arise from the basilar artery and are well opacified to their distal aspects. No aneurysm or vascular malformation. IMPRESSION: MRI HEAD IMPRESSION: 1. 15 mm acute nonhemorrhagic ischemic infarct within the left ventromedial thalamus. 2. Punctate foci of restricted diffusion within the right thalamus and posterior limb of the right internal capsule, also suspicious for subtle ischemic changes. 3. Generalized cerebral atrophy with advanced chronic microvascular ischemic disease and multiple small remote lacunar infarcts as above. MRA HEAD IMPRESSION: Normal intracranial MRA.  No large or proximal arterial branch occlusion. No high-grade or correctable stenosis. Electronically Signed   By: Jeannine Boga M.D.   On: 02/17/2016 22:21     Scheduled Meds: . aspirin  300 mg Rectal Daily   Or  . aspirin  325 mg Oral Daily  . atorvastatin  40 mg Oral q1800  . enoxaparin (LOVENOX) injection  40 mg Subcutaneous Q24H  . insulin aspart  0-15 Units Subcutaneous TID WC  . insulin aspart  0-5 Units Subcutaneous QHS  . sertraline  100 mg Oral QHS   Continuous Infusions:    Marzetta Board, MD [facility] Pager [phone]  If 7PM-7AM, please contact night-coverage [email or web address] Password [facility] 02/18/2016, 3:05 PM

## 2016-02-18 NOTE — Evaluation (Signed)
Physical Therapy Evaluation Patient Details Name: Sheila [name] MRN: [id number] DOB: 10/16/1951 Today's Date: 02/18/2016   History of Present Illness  65 year old female with history of diabetes, prior CVA, hyperlipidemia, admitted on 2/21 with confusion and memory problems, with MRI positive for CVA  Clinical Impression  Pt admitted with above diagnosis. Pt currently with functional limitations due to the deficits listed below (see PT Problem List). Confused, oriented to name only, and provides a poor history (RN notified.) Demonstrates instability with gait showing significant loss of balance without UE support, but improves with use of an assistive device (RW.)  Pt will benefit from skilled PT to increase their independence and safety with mobility to allow discharge to the venue listed below.       Follow Up Recommendations Other (comment);Supervision/Assistance - 24 hour (Pending progress - if slow to recover may benefit from ST SNF)    Equipment Recommendations  None recommended by PT    Recommendations for Other Services OT consult     Precautions / Restrictions Precautions Precautions: Fall Restrictions Weight Bearing Restrictions: No      Mobility  Bed Mobility Overal bed mobility: Modified Independent             General bed mobility comments: extra time  Transfers Overall transfer level: Needs assistance Equipment used: None Transfers: Sit to/from Stand Sit to Stand: Supervision         General transfer comment: supervision for safety. Reaching for furniture to support self. Performe x2 from bed.  Ambulation/Gait Ambulation/Gait assistance: Min guard Ambulation Distance (Feet): 115 Feet Assistive device: Rolling walker (2 wheeled) Gait Pattern/deviations: Step-through pattern;Decreased stride length;Antalgic;Trendelenburg;Wide base of support (RLE externally rotated) Gait velocity: decreased Gait velocity interpretation: Below normal speed for  age/gender General Gait Details: Educated on safe DME use with a rolling walker. Tried several steps in room without device demonstrating significant instability. RLE unstable, reaching for rail in hallway and does not feel confident without RW for support. Close guard for safety with cues for symmetry of gait.  Stairs            Wheelchair Mobility    Modified Rankin (Stroke Patients Only) Modified Rankin (Stroke Patients Only) Pre-Morbid Rankin Score: Moderate disability Modified Rankin: Moderately severe disability     Balance Overall balance assessment: Needs assistance Sitting-balance support: No upper extremity supported;Feet supported Sitting balance-Leahy Scale: Good     Standing balance support: No upper extremity supported Standing balance-Leahy Scale: Fair                               Pertinent Vitals/Pain Pain Assessment: No/denies pain    Home Living Family/patient expects to be discharged to:: Private residence Living Arrangements: Alone Available Help at Discharge: Family;Available PRN/intermittently Type of Home: House Home Access: Level entry     Home Layout: One level Home Equipment: Walker - 4 wheels;Cane - single point      Prior Function Level of Independence: Independent with assistive device(s)         Comments: Uses rollator for most mobility     Hand Dominance   Dominant Hand: Right    Extremity/Trunk Assessment   Upper Extremity Assessment: Defer to OT evaluation           Lower Extremity Assessment: RLE deficits/detail;Difficult to assess due to impaired cognition (.) RLE Deficits / Details: RN reports RLE weakness from previous CVA. Currently, grossly 4/5 compared to LT which is  5/5 strength.        Communication   Communication: No difficulties  Cognition Arousal/Alertness: Awake/alert Behavior During Therapy: WFL for tasks assessed/performed Overall Cognitive Status: Impaired/Different from  baseline Area of Impairment: Orientation;Following commands;Safety/judgement;Problem solving Orientation Level: Disoriented to;Person;Time;Situation;Place   Memory: Decreased recall of precautions Following Commands: Follows one step commands with increased time;Follows one step commands inconsistently Safety/Judgement: Decreased awareness of safety;Decreased awareness of deficits   Problem Solving: Slow processing;Difficulty sequencing;Requires verbal cues General Comments: Oriented to name only. - could not recall date of birth, thinks she is in the [place], unsure of current month/year, knows she is in the hospital, but not why she is here. Reoridented.    General Comments General comments (skin integrity, edema, etc.): Pt with inaccurate history.    Exercises        Assessment/Plan    PT Assessment Patient needs continued PT services  PT Diagnosis Difficulty walking;Abnormality of gait;Hemiplegia dominant side;Altered mental status   PT Problem List Decreased strength;Decreased activity tolerance;Decreased balance;Decreased mobility;Decreased cognition;Decreased knowledge of use of DME;Decreased safety awareness;Decreased knowledge of precautions;Impaired sensation  PT Treatment Interventions DME instruction;Gait training;Functional mobility training;Therapeutic activities;Therapeutic exercise;Balance training;Neuromuscular re-education;Cognitive remediation;Patient/family education   PT Goals (Current goals can be found in the Care Plan section) Acute Rehab PT Goals Patient Stated Goal: none stated PT Goal Formulation: Patient unable to participate in goal setting Time For Goal Achievement: 03/03/16 Potential to Achieve Goals: Good    Frequency Min 4X/week   Barriers to discharge Decreased caregiver support lives alone    Co-evaluation               End of Session Equipment Utilized During Treatment: Gait belt Activity Tolerance: Patient tolerated treatment  well Patient left: in bed;with call bell/phone within reach;with bed alarm set Nurse Communication: Mobility status         Time: 1530-1555 PT Time Calculation (min) (ACUTE ONLY): 25 min   Charges:   PT Evaluation $PT Eval Moderate Complexity: 1 Procedure PT Treatments $Gait Training: 8-22 mins   PT G CodesEllouise Newer 02/18/2016, 4:13 PM  Camille Bal [place], [place]

## 2016-02-18 NOTE — Care Management Note (Addendum)
Case Management Note  Patient Details  Name: Sheila [name] MRN: [id number] Date of Birth: 1951-02-04  Subjective/Objective:              Spoke with patient at the bedside, assessment limited by patient's confusion. Patient pleasantly confused, could not answer what county she lived in or if she had [facility] PTA as documented in admission nurse assessment. Attempted to call son Elta [place] per her request, but no answer and no VM. Admitted with CVA.   Action/Plan:  Patient active with Nanine Means for [facility] PT prior to admission  Expected Discharge Date:                  Expected Discharge Plan:  [place]  In-House Referral:     Discharge planning Services  CM Consult  Post Acute Care Choice:    Choice offered to:     DME Arranged:    DME Agency:     HH Arranged:    HH Agency:     Status of Service:  In process, will continue to follow  Medicare Important Message Given:    Date Medicare IM Given:    Medicare IM give by:    Date Additional Medicare IM Given:    Additional Medicare Important Message give by:     If discussed at [place] of Stay Meetings, dates discussed:    Additional Comments:  Carles Collet, RN 02/18/2016, 12:15 PM

## 2016-02-18 NOTE — Progress Notes (Signed)
NURSING PROGRESS NOTE  [name] ReidMRN: [id number] Admission Data: 02/18/16 at [place] Attending Provider: Rise Patience, MD PCP: Mauricio Po, FNP Code status: Full  Allergies:  Allergies  Allergen Reactions  . Sulfa Antibiotics   . Paxil [Paroxetine Hcl] Other (See Comments)    Dreams/ nightmares    Past Medical History:  Past Medical History  Diagnosis Date  . Arthritis   . Diabetes mellitus     complicated by peripheral neuropathy  . Stroke [facility]) 2015    residual right foot drop  . Hyperlipidemia     Past Surgical History:  Past Surgical History  Procedure Laterality Date  . Cholecystectomy    . Tonsillectomy      Sheila [name] is a 65 y.o. female patient, arrived to floor in room 5W10 via [place], transferred from [facility]. Patient alert and oriented X 3. No acute distress noted. Denies pain.   Vital signs: Oral temperature 98.7 F (37.1 C), Blood pressure 134/77, Pulse 91, RR 16, SpO2 100 % on room air. Height 5'2", weight 170 lbs (77.111 kg).   Cardiac monitoring: Telemetry box 5W # 15 in place.  IV access: Left forearm; condition patent and no redness.  Skin: intact, no pressure ulcer noted in sacral area.   Patient's ID armband verified with patient/ family, and in place. Information packet given to patient/ family. Fall risk assessed, SR up X2, patient/ family able to verbalize understanding of risks associated with falls and to call nurse or staff to assist before getting out of bed. Patient/ family oriented to room and equipment. Call bell within reach.

## 2016-02-19 ENCOUNTER — Inpatient Hospital Stay (HOSPITAL_COMMUNITY): Payer: Medicare Other

## 2016-02-19 ENCOUNTER — Encounter (HOSPITAL_COMMUNITY): Payer: Self-pay

## 2016-02-19 ENCOUNTER — Other Ambulatory Visit (HOSPITAL_COMMUNITY): Payer: Self-pay

## 2016-02-19 DIAGNOSIS — I639 Cerebral infarction, unspecified: Secondary | ICD-10-CM | POA: Insufficient documentation

## 2016-02-19 DIAGNOSIS — I638 Other cerebral infarction: Secondary | ICD-10-CM

## 2016-02-19 LAB — LIPID PANEL
CHOL/HDL RATIO: 4.8 ratio
CHOLESTEROL: 158 mg/dL (ref 0–200)
HDL: 33 mg/dL — ABNORMAL LOW (ref >40)
LDL CALC: 92 mg/dL (ref 0–99)
TRIGLYCERIDES: 165 mg/dL — AB (ref <150)
VLDL: 33 mg/dL (ref 0–40)

## 2016-02-19 LAB — GLUCOSE, CAPILLARY
GLUCOSE-CAPILLARY: 85 mg/dL (ref 65–99)
Glucose-Capillary: 145 mg/dL — ABNORMAL HIGH (ref 65–99)
Glucose-Capillary: 210 mg/dL — ABNORMAL HIGH (ref 65–99)
Glucose-Capillary: 125 mg/dL — ABNORMAL HIGH (ref 65–99)

## 2016-02-19 MED ORDER — CLOPIDOGREL BISULFATE 75 MG PO TABS
75.0000 mg | ORAL_TABLET | Freq: Every day | ORAL | Status: DC
  Administered 2016-02-19 – 2016-02-20 (×2): 75 mg via ORAL
  Filled 2016-02-19 (×2): qty 1

## 2016-02-19 NOTE — NC FL2 (Signed)
[place] LEVEL OF CARE SCREENING TOOL     IDENTIFICATION  Patient Name: Sheila [name] Birthdate: [birth year] Sex: female Admission Date (Current Location): 02/17/2016  [facility] and [place] Number:  Herbalist and Address:  [facility] [place], [place] [address] [place], [place] [address]      Provider Number: [id number]  Attending Physician Name and Address:  Caren Griffins, MD  Relative Name and Phone Number:  Elta Guadeloupe, son, [phone]    Current Level of Care: Hospital Recommended Level of Care: [place] Prior Approval Number:    Date Approved/Denied:   PASRR Number: [id number]  Discharge Plan: SNF    Current Diagnoses: Patient Active Problem List   Diagnosis Date Noted  . Anomia 02/17/2016  . Stroke ([place]) 02/17/2016  . Peripheral arterial disease ([place]) 12/03/2015  . Neuropathy ([place]) 09/05/2015  . Medicare annual wellness visit, subsequent 03/12/2015  . Routine general medical examination at a health care facility 10/08/2014  . Cerumen impaction 10/08/2014  . Low back pain potentially associated with radiculopathy 03/01/2013  . Hyperlipidemia 02/15/2012  . DM (diabetes mellitus) ([place]) 02/15/2012    Orientation RESPIRATION BLADDER Height & Weight     Self, Place  Normal Continent Weight: 170 lb (77.111 kg) Height:  5\' 2"  (157.5 cm)  BEHAVIORAL SYMPTOMS/MOOD NEUROLOGICAL BOWEL NUTRITION STATUS   (N/A)   Continent  (Please see dc summary)  AMBULATORY STATUS COMMUNICATION OF NEEDS Skin   Limited Assist Verbally Normal                       Personal Care Assistance Level of Assistance  Bathing, Feeding, Dressing Bathing Assistance: Limited assistance Feeding assistance: Limited assistance Dressing Assistance: Limited assistance     Functional Limitations Info             SPECIAL CARE FACTORS FREQUENCY  PT (By licensed PT)     PT Frequency: 4x/week              Contractures       Additional Factors Info  Code Status, Allergies, Insulin Sliding Scale, Psychotropic Code Status Info: Full Allergies Info: Sulfa Antibiotics, Paxil Psychotropic Info: Zoloft Insulin Sliding Scale Info: insulin aspart (novoLOG) injection 0-15 Units; insulin aspart (novoLOG) injection 0-5 Units       Current Medications (02/19/2016):  This is the current hospital active medication list Current Facility-Administered Medications  Medication Dose Route Frequency Provider Last Rate Last Dose  . acetaminophen (TYLENOL) tablet 650 mg  650 mg Oral Q4H PRN Lavina Hamman, MD       Or  . acetaminophen (TYLENOL) suppository 650 mg  650 mg Rectal Q4H PRN Lavina Hamman, MD      . atorvastatin (LIPITOR) tablet 40 mg  40 mg Oral q1800 Lavina Hamman, MD   40 mg at 02/18/16 1739  . clopidogrel (PLAVIX) tablet 75 mg  75 mg Oral Daily David L Rinehuls, PA-C      . cyclobenzaprine (FLEXERIL) tablet 10 mg  10 mg Oral TID PRN Lavina Hamman, MD      . enoxaparin (LOVENOX) injection 40 mg  40 mg Subcutaneous Q24H Lavina Hamman, MD   40 mg at 02/18/16 2258  . insulin aspart (novoLOG) injection 0-15 Units  0-15 Units Subcutaneous TID WC Lavina Hamman, MD   2 Units at 02/19/16 0831  . insulin aspart (novoLOG) injection 0-5 Units  0-5 Units Subcutaneous QHS Lavina Hamman,  MD   0 Units at 02/18/16 0033  . LORazepam (ATIVAN) tablet 0.5 mg  0.5 mg Oral Q6H PRN Lavina Hamman, MD   0.5 mg at 02/18/16 2257  . ondansetron (ZOFRAN) injection 4 mg  4 mg Intravenous Q6H PRN Jeryl Columbia, NP      . sertraline (ZOLOFT) tablet 100 mg  100 mg Oral QHS Lavina Hamman, MD   100 mg at 02/18/16 2257  . traMADol (ULTRAM) tablet 50 mg  50 mg Oral Q12H PRN Lavina Hamman, MD         Discharge Medications: Please see discharge summary for a list of discharge medications.  Relevant Imaging Results:  Relevant Lab Results:   Additional Information SSN: [place], [place]

## 2016-02-19 NOTE — Progress Notes (Signed)
Physical Therapy Treatment Patient Details Name: Sheila [name] MRN: [id number] DOB: [birth year] Today's Date: 02/19/2016    History of Present Illness 65 year old female with history of diabetes, prior CVA, hyperlipidemia, admitted on 2/21 with confusion and memory problems, with MRI positive for CVA    PT Comments    Pt performed increased gait distance remains disoriented x 4.  Pt required cues for safety through tx.    Follow Up Recommendations  Other (comment);Supervision/Assistance - 24 hour (if slow to recover may require SNF.  )     Equipment Recommendations  None recommended by PT    Recommendations for Other Services       Precautions / Restrictions Precautions Precautions: Fall Restrictions Weight Bearing Restrictions: No    Mobility  Bed Mobility Overal bed mobility: Modified Independent                Transfers Overall transfer level: Needs assistance Equipment used: Rolling walker (2 wheeled) Transfers: Sit to/from Stand Sit to Stand: Supervision         General transfer comment: Requires supervision for safety.    Ambulation/Gait Ambulation/Gait assistance: Min guard Ambulation Distance (Feet): 280 Feet Assistive device: Rolling walker (2 wheeled) Gait Pattern/deviations: Step-through pattern;Decreased stride length;Antalgic;Wide base of support;Trendelenburg Gait velocity: decreased   General Gait Details: Required cues for RW safety, turns, backing, Increasing B step length and gait symmetry with weight shifting.     Stairs            Wheelchair Mobility    Modified Rankin (Stroke Patients Only)       Balance     Sitting balance-Leahy Scale: Good       Standing balance-Leahy Scale: Fair                      Cognition Arousal/Alertness: Awake/alert Behavior During Therapy: WFL for tasks assessed/performed Overall Cognitive Status: Impaired/Different from baseline   Orientation Level: Disoriented  to;Place;Time;Situation   Memory: Decreased recall of precautions Following Commands: Follows one step commands with increased time;Follows one step commands inconsistently       General Comments: oriented to name only, could not recall month of year, or birthdate.  reports she is in the [place], [place], unsure of name of hospital but reports she is in the hospital.  Reoriented and remains to make errors.      Exercises      General Comments        Pertinent Vitals/Pain Pain Assessment: No/denies pain    Home Living                      Prior Function            PT Goals (current goals can now be found in the care plan section) Acute Rehab PT Goals Potential to Achieve Goals: Good Progress towards PT goals: Progressing toward goals    Frequency  Min 4X/week    PT Plan      Co-evaluation             End of Session Equipment Utilized During Treatment: Gait belt Activity Tolerance: Patient tolerated treatment well Patient left: in bed;with call bell/phone within reach;with bed alarm set     Time: [id number] PT Time Calculation (min) (ACUTE ONLY): 19 min  Charges:  $Gait Training: 8-22 mins                    G Codes:  Takyla Kuchera Eli Hose 02/19/2016, 3:50 PM  Governor Rooks, PTA pager [phone]

## 2016-02-19 NOTE — Clinical Social Work Note (Signed)
Clinical Social Work Assessment  Patient Details  Name: Sheila [name] MRN: [id number] Date of Birth: [birth year]  Date of referral:  02/19/16               Reason for consult:  Facility Placement                Permission sought to share information with:  Facility Sport and exercise psychologist, Family Supports Permission granted to share information::  Yes, Verbal Permission Granted  Name::     Katina Dung  Agency::  [facility] SNFs  Relationship::  Son  Contact Information:  [phone]  Housing/Transportation Living arrangements for the past 2 months:  [place] of Information:  Adult Children (Patient disoriented) Patient Interpreter Needed:  None Criminal Activity/Legal Involvement Pertinent to Current Situation/Hospitalization:  No - Comment as needed Significant Relationships:  Adult Children Lives with:  Self Do you feel safe going back to the place where you live?  No Need for family participation in patient care:  Yes (Comment)  Care giving concerns:  CSW received referral for possible SNF placement at time of discharge. CSW spoke with patient and patient's son regarding PT recommendation of SNF placement at time of discharge. Patient is disoriented. Per patient's son, patient lives alone in an apartment and is currently unable to care for herself given patient's current physical needs and fall risk. Patient and patient's son expressed understanding of PT recommendation and are agreeable to SNF placement at time of discharge. CSW to continue to follow and assist with discharge planning needs.   Social Worker assessment / plan:  CSW spoke with patient and patient's son concerning possibility of rehab at [facility] before returning home.  Employment status:  Retired Nurse, adult PT Recommendations:  [place] / Referral to community resources:  [place]  Patient/Family's Response to care:  Patient and  patient's son recognize need for rehab before returning home and are agreeable to a SNF in [place].   Patient/Family's Understanding of and Emotional Response to Diagnosis, Current Treatment, and Prognosis:  Patient is realistic regarding therapy needs. No questions/concerns about plan or treatment.    Emotional Assessment Appearance:  Appears stated age Attitude/Demeanor/Rapport:   (Appropriate but disoriented) Affect (typically observed):  Accepting Orientation:  Oriented to Self, Oriented to Place Alcohol / Substance use:  Not Applicable Psych involvement (Current and /or in the community):  No (Comment)  Discharge Needs  Concerns to be addressed:  Care Coordination Readmission within the last 30 days:  No Current discharge risk:  None Barriers to Discharge:  Continued Medical Work up   [employer], [place] 02/19/2016, 11:51 AM

## 2016-02-19 NOTE — Progress Notes (Signed)
STROKE TEAM PROGRESS NOTE   HISTORY OF PRESENT ILLNESS Sheila [name] is an 65 y.o. female with a history of previous right CVA, diabetes mellitus and hypertension brought to the emergency room with change in mental status. Family members as well as home PT noticed a clear change in mental status with confusion. Patient was last known well 2 days prior to presentation. No focal weakness was noted. There was no dysarthria. There is also no facial droop noticed. She's been taking aspirin daily. CT scan and MRI showed acute left thalamic infarction.   LSN: 02/15/2016 tPA Given: No: Beyond time under for treatment consideration mRankin   SUBJECTIVE (INTERVAL HISTORY) No family at the bedside. Patient feels she is doing well.No new problems overnight. Patient lives alone. No one helps her with her medications at home. Spoke to son, sounds like she has some baseline memory deficits but she is very social, he sees her at church on Sundays, and she has friends who check up on her.   OBJECTIVE Temp:  [97.7 F (36.5 C)-98.9 F (37.2 C)] 98.4 F (36.9 C) (02/23 0540) Pulse Rate:  [85-98] 98 (02/23 0540) Cardiac Rhythm:  [-] Normal sinus rhythm (02/22 2053) Resp:  [16-20] 20 (02/23 0540) BP: (105-141)/(62-88) 141/71 mmHg (02/23 0540) SpO2:  [97 %-100 %] 98 % (02/23 0540)  CBC:   Recent Labs Lab 02/17/16 1446  WBC 9.6  NEUTROABS 6.5  HGB 14.4  HCT 40.8  MCV 86.8  PLT 0000000    Basic Metabolic Panel:   Recent Labs Lab 02/17/16 1446  NA 134*  K 4.1  CL 102  CO2 20*  GLUCOSE 146*  BUN 14  CREATININE 1.19*  CALCIUM 9.4    Lipid Panel:     Component Value Date/Time   CHOL 158 02/19/2016 0556   TRIG 165* 02/19/2016 0556   HDL 33* 02/19/2016 0556   CHOLHDL 4.8 02/19/2016 0556   VLDL 33 02/19/2016 0556   LDLCALC 92 02/19/2016 0556   HgbA1c:  Lab Results  Component Value Date   HGBA1C 6.5 09/05/2015   Urine Drug Screen:     Component Value Date/Time   LABOPIA NONE  DETECTED 02/17/2016 2013   COCAINSCRNUR NONE DETECTED 02/17/2016 2013   LABBENZ NONE DETECTED 02/17/2016 2013   AMPHETMU NONE DETECTED 02/17/2016 2013   THCU NONE DETECTED 02/17/2016 2013   LABBARB NONE DETECTED 02/17/2016 2013      IMAGING  Dg Chest 2 View 02/17/2016   1. Mild enlargement of the cardiopericardial silhouette, without edema.  2. Mildly elevated left hemidiaphragm with suspected subsegmental atelectasis in the left lower lobe.    Ct Head Wo Contrast 02/17/2016   Atrophy with small vessel chronic ischemic changes of deep cerebral white matter. Age-indeterminate lacunar infarct LEFT thalamus. No additional intracranial abnormalities.    Mr Sheila [name] Head Wo Contrast 02/17/2016    MRI HEAD IMPRESSION:  1. 15 mm acute nonhemorrhagic ischemic infarct within the left ventromedial thalamus.  2. Punctate foci of restricted diffusion within the right thalamus and posterior limb of the right internal capsule, also suspicious for subtle ischemic changes.  3. Generalized cerebral atrophy with advanced chronic microvascular ischemic disease and multiple small remote lacunar infarcts as above.   MRA HEAD IMPRESSION:  Normal intracranial MRA. No large or proximal arterial branch occlusion. No high-grade or correctable stenosis.    PHYSICAL EXAM Physical exam: Exam: Gen: NAD Eyes: anicteric sclerae, moist conjunctivae  CV: no MRG, no carotid bruits, no peripheral edema Mental Status: Alert, follows commands, oriented to self and month but not year.  Neuro: Detailed Neurologic Exam  Speech:    No aphasia, no dysarthria  Cranial Nerves:    The pupils are equal, round, and reactive to light.. Attempted, Fundi not visualized.  EOMI. No gaze preference. Visual fields full. Face symmetric, Tongue midline. Hearing intact to voice. Shoulder shrug intact  Motor Observation:    no involuntary movements noted. Tone appears normal.     Strength:    5/5      Sensation:  Intact to LT  Plantars downgoing.   ASSESSMENT/PLAN Ms. Sheila [name] is a 65 y.o. female with history of diabetes mellitus, hypertension, previous stroke, and hyperlipidemia presenting with a change in mental status. She did not receive IV t-PA due to late presentation.  Stroke: Dominant infarct felt secondary to small vessel disease.  Resultant  Deficits resolved.   MRI 15 mm acute nonhemorrhagic ischemic infarct within the left ventromedial thalamus.    MRA  Normal intracranial MRA  Carotid Doppler - pending  2D Echo  pending  LDL 71  HgbA1c pending  VTE prophylaxis - Lovenox Diet heart healthy/carb modified Room service appropriate?: Yes; Fluid consistency:: Thin  aspirin 81 mg daily prior to admission, now on aspirin 325 mg daily. Change to Plavix.  Stop Aspirin.   Patient counseled to be compliant with her antithrombotic medications  Ongoing aggressive stroke risk factor management  Therapy recommendations:  Consider SNF  Disposition:  Pending  Hypertension  Mildly low blood pressures at times.  Permissive hypertension (OK if < 220/120) but gradually normalize in 5-7 days   Hyperlipidemia  Home meds:  Lipitor 20 mg daily prior to admission  LDL 71, goal < 70  Now on Lipitor 40 mg daily  Continue statin at discharge  Diabetes  HgbA1c pending, goal < 7.0  Controlled  Other Stroke Risk Factors  Advanced age  Cigarette smoker, quit smoking 24 years ago   Obesity, Body mass index is 31.09 kg/(m^2).   Hx stroke/TIA   Other Active Problems  Mildly elevated creatinine 1.19   Personally examined patient and images, and have participated in and made any corrections needed to history, physical, neuro exam,assessment and plan as stated above.  I have personally obtained the history, evaluated lab date, reviewed imaging studies and agree with radiology interpretations.    Sarina Ill, MD Stroke Neurology [phone] [facility]      To contact Stroke Continuity provider, please refer to [email or web address] After hours, contact General Neurology

## 2016-02-19 NOTE — Progress Notes (Signed)
Discussed stroke living with stroke booklet and the importance of activity with the patient along with her son Elta Guadeloupe and daughter in Sports coach. Pt with no evidence of learning. Family understanding and with no further questions at this time/

## 2016-02-19 NOTE — Progress Notes (Signed)
Pt and family (son and daughter-in-law) given information on stroke prevention, signs/symptoms, and plavix.

## 2016-02-19 NOTE — Evaluation (Signed)
Speech Language Pathology Evaluation Patient Details Name: Sheila [name] MRN: [id number] DOB: 12/09/1951 Today's Date: 02/19/2016 Time: [id number] SLP Time Calculation (min) (ACUTE ONLY): 31 min  Problem List:  Patient Active Problem List   Diagnosis Date Noted  . Anomia 02/17/2016  . Stroke ([place]) 02/17/2016  . Peripheral arterial disease ([place]) 12/03/2015  . Neuropathy ([place]) 09/05/2015  . Medicare annual wellness visit, subsequent 03/12/2015  . Routine general medical examination at a health care facility 10/08/2014  . Cerumen impaction 10/08/2014  . Low back pain potentially associated with radiculopathy 03/01/2013  . Hyperlipidemia 02/15/2012  . DM (diabetes mellitus) ([place]) 02/15/2012   Past Medical History:  Past Medical History  Diagnosis Date  . Arthritis   . Diabetes mellitus     complicated by peripheral neuropathy  . Stroke [facility]) 2015    residual right foot drop  . Hyperlipidemia    Past Surgical History:  Past Surgical History  Procedure Laterality Date  . Cholecystectomy    . Tonsillectomy     HPI:  65 y.o. female with Past medical history of diabetes mellitus, prior history of CVA, dyslipidemia, chronic neuropathy. The patient presented with complains of confusion. Patient was brought in by a family member. Her son talked to her yesterday and she was fine. Sometime this morning when the son talked to her she was felt to be having some confusion; 02/17/16 MR head indicated 15 mm acute nonhemorrhagic ischemic infarct within the left ventromedial thalamus; pt informed SLP she was having difficulty "finding the right words" and "my thinking is mixed up."   Assessment / Plan / Recommendation Clinical Impression   Pt presents with Broca's aphasia at phrase-conversation level with neologisms/perseveration present during verbal communicative tasks. Naming tasks were approximately 30-45% accurate with divergent/convergent tasks and 75% accurate with responsive naming  tasks.  Pt had difficulty with basic auditory comprehension tasks with simple paragraph comprehension and following >3-step commands for functional tasks.  She exhibited overall decreased attention, impulsivity and decreased reasoning as she attempted to get out of bed despite information re: fall risk since CVA; pt was unable to formulate simple problem solving measures re: functional information as well (i.e.: medication administration, awareness of deficits) during SLE.  ST will f/u re: aphasia/cognitive deficits while in house and recommend SNF upon d/c to address therapy needs d/t safety concerns.    SLP Assessment  Patient needs continued Speech Language Pathology Services    Follow Up Recommendations  Skilled Nursing facility    Frequency and Duration min 2x/week  1 week      SLP Evaluation Prior Functioning  Cognitive/Linguistic Baseline: Within functional limits Type of Home: House  Lives With: Alone Available Help at Discharge: Family;Friend(s);Available PRN/intermittently   Cognition  Overall Cognitive Status: Impaired/Different from baseline Arousal/Alertness: Awake/alert Orientation Level: Oriented to person;Oriented to place;Disoriented to time;Disoriented to situation Attention: Sustained Sustained Attention: Impaired Sustained Attention Impairment: Verbal basic Memory: Impaired Memory Impairment: Retrieval deficit;Decreased recall of new information;Decreased short term memory Decreased Short Term Memory: Verbal basic;Functional basic Awareness: Impaired Awareness Impairment: Anticipatory impairment Problem Solving: Impaired Problem Solving Impairment: Verbal basic;Functional basic Behaviors: Impulsive;Perseveration Safety/Judgment: Impaired Comments:  (unaware of precautions needed)    Comprehension  Auditory Comprehension Overall Auditory Comprehension: Impaired Yes/No Questions: Within Functional Limits Commands: Impaired Complex Commands: 50-74%  accurate Conversation: Complex Other Conversation Comments:  (anomia present; STM deficits) Visual Recognition/Discrimination Discrimination: Not tested Reading Comprehension Reading Status: Not tested    Expression Expression Primary Mode of Expression: Verbal Verbal Expression Overall  Verbal Expression: Impaired Initiation: No impairment Level of Generative/Spontaneous Verbalization: Sentence Repetition: No impairment Naming: Impairment Responsive: 76-100% accurate Confrontation: Within functional limits Convergent: 25-49% accurate Divergent: 25-49% accurate Verbal Errors: Confabulation Interfering Components: Attention Non-Verbal Means of Communication: Not applicable Written Expression Dominant Hand: Right Written Expression: Unable to assess (comment)   Oral / Motor  Oral Motor/Sensory Function Overall Oral Motor/Sensory Function: Within functional limits Motor Speech Overall Motor Speech: Appears within functional limits for tasks assessed Respiration: Within functional limits Phonation: Normal Resonance: Within functional limits Articulation: Within functional limitis Intelligibility: Intelligible Motor Planning: Witnin functional limits Motor Speech Errors: Not applicable                       Kyrin Gratz,PAT, M.S., CCC-SLP 02/19/2016, 10:27 AM

## 2016-02-19 NOTE — Progress Notes (Signed)
   02/19/16 1340  PT Visit Information  Reason Eval/Treat Not Completed Patient declined, no reason specified (pt requesting to eat lunch before therapy, will continue efforts if time permits.  )

## 2016-02-19 NOTE — Progress Notes (Signed)
  Echocardiogram 2D Echocardiogram has been performed.  Bobbye Charleston 02/19/2016, 2:58 PM

## 2016-02-19 NOTE — Progress Notes (Signed)
PROGRESS NOTE  Sheila [name] [id number] DOB: March 04, 1951 DOA: 02/17/2016 PCP: Mauricio Po, FNP Outpatient Specialists:    LOS: 2 days   Brief Narrative: 65 year old female with history of diabetes, prior CVA, hyperlipidemia, admitted on 2/21 with confusion and memory problems, with MRI positive for CVA.  Assessment & Plan: Principal Problem:   Anomia Active Problems:   Hyperlipidemia   DM (diabetes mellitus) ([place])   Neuropathy ([place])   Peripheral arterial disease ([place])   Stroke [facility])   CVA / Anomia - neurology consulted, appreciate input, MRI positive for stroke. Discussed with neurology this morning - Obtain 2-D echo, pending - carotid Dopplers done earlier this month, will not repeat  - change to plavix per neurology  - Mental status fluctuates   Hyperlipidemia - Continue statin   Bacteriuria  - Patient is afebrile, without leukocytosis, without any urinary symptoms, unlikely to represent a true urinary tract infection, hold antibiotics for now.  Diabetes mellitus - Continue sliding scale insulin - Hemoglobin A1c pending   Tobacco abuse - Consult for cessation today   DVT prophylaxis: Lovenox  Code Status: Full code  Family Communication: Discussed with son and daughter-in-law at bedside 2/22 Disposition Plan: SNF 1 day  Barriers for discharge: CVA workup   Consultants:   Neurology   Procedures:   2D echo: Pending   Carotid doppler - done as an outpatient 2/10 Heterogeneous plaque in the right bifurcation, 1-39% RICA stenosis Normal LICA. Normal subclavian arteries, bilaterally. Patent vertebral arteries with antegrade flow.  Antimicrobials:  None    Subjective: - no complaints, thinks she remembers me from yesterday. Not 100% sure. Denies pain, dyspnea.  Objective: Filed Vitals:   02/18/16 2153 02/19/16 0307 02/19/16 0540 02/19/16 1000  BP: 139/62 139/83 141/71 123/59  Pulse: 91 93 98   Temp: 98.9 F (37.2 C) 98.6 F (37 C)  98.4 F (36.9 C) 98.3 F (36.8 C)  TempSrc: Oral Oral Oral Oral  Resp: 20 20 20 20   Height:      Weight:      SpO2: 98% 98% 98% 97%    Intake/Output Summary (Last 24 hours) at 02/19/16 1256 Last data filed at 02/19/16 1027  Gross per 24 hour  Intake    460 ml  Output      0 ml  Net    460 ml   Filed Weights   02/18/16 0157  Weight: 77.111 kg (170 lb)    Examination: BP 123/59 mmHg  Pulse 98  Temp(Src) 98.3 F (36.8 C) (Oral)  Resp 20  Ht 5\' 2"  (1.575 m)  Wt 77.111 kg (170 lb)  BMI 31.09 kg/m2  SpO2 97% General exam: NAD Respiratory system: Clear. No increased work of breathing. No wheezing, no crackles Cardiovascular system: regular rate and rhythm, no murmurs, gallops. No JVD. No peripheral edema.  Gastrointestinal system: Abdomen is nondistended, soft and nontender. Normal bowel sounds heard. Central nervous system: AxOx3. No focal deficits Extremities: No clubbing/cyanosis Skin: no rashes   Data Reviewed: I have personally reviewed following labs and imaging studies  CBC:  Recent Labs Lab 02/17/16 1446  WBC 9.6  NEUTROABS 6.5  HGB 14.4  HCT 40.8  MCV 86.8  PLT 0000000   Basic Metabolic Panel:  Recent Labs Lab 02/17/16 1446  NA 134*  K 4.1  CL 102  CO2 20*  GLUCOSE 146*  BUN 14  CREATININE 1.19*  CALCIUM 9.4   GFR: Estimated Creatinine Clearance: 45.3 mL/min (by C-G formula based on Cr of  1.19). Liver Function Tests:  Recent Labs Lab 02/17/16 1446  AST 27  ALT 20  ALKPHOS 105  BILITOT 0.5  PROT 7.8  ALBUMIN 4.2   No results for input(s): LIPASE, AMYLASE in the last 168 hours. No results for input(s): AMMONIA in the last 168 hours. Coagulation Profile:  Recent Labs Lab 02/17/16 1748  INR 1.14   Cardiac Enzymes: No results for input(s): CKTOTAL, CKMB, CKMBINDEX, TROPONINI in the last 168 hours. BNP (last 3 results) No results for input(s): PROBNP in the last 8760 hours. HbA1C: No results for input(s): HGBA1C in the last 72  hours. CBG:  Recent Labs Lab 02/18/16 1153 02/18/16 1701 02/18/16 2305 02/19/16 0804 02/19/16 1158  GLUCAP 127* 144* 112* 145* 210*   Lipid Profile:  Recent Labs  02/19/16 0556  CHOL 158  HDL 33*  LDLCALC 92  TRIG 165*  CHOLHDL 4.8   Thyroid Function Tests: No results for input(s): TSH, T4TOTAL, FREET4, T3FREE, THYROIDAB in the last 72 hours. Anemia Panel: No results for input(s): VITAMINB12, FOLATE, FERRITIN, TIBC, IRON, RETICCTPCT in the last 72 hours. Urine analysis:    Component Value Date/Time   COLORURINE ORANGE* 02/17/2016 2013   APPEARANCEUR CLOUDY* 02/17/2016 2013   LABSPEC 1.041* 02/17/2016 2013   PHURINE 5.0 02/17/2016 2013   GLUCOSEU NEGATIVE 02/17/2016 2013   HGBUR NEGATIVE 02/17/2016 2013   BILIRUBINUR MODERATE* 02/17/2016 2013   BILIRUBINUR Neg 01/22/2014 1656   KETONESUR NEGATIVE 02/17/2016 2013   PROTEINUR 30* 02/17/2016 2013   PROTEINUR Neg 01/22/2014 1656   UROBILINOGEN 0.2 01/22/2014 1656   NITRITE POSITIVE* 02/17/2016 2013   NITRITE Neg 01/22/2014 1656   LEUKOCYTESUR MODERATE* 02/17/2016 2013   Sepsis Labs: @LABRCNTIP (procalcitonin:4,lacticidven:4)  )No results found for this or any previous visit (from the past 240 hour(s)).    Radiology Studies: Dg Chest 2 View  02/17/2016  CLINICAL DATA:  Altered mental status.  Tachycardia.  Weakness. EXAM: CHEST  2 VIEW COMPARISON:  None. FINDINGS: Low lung volumes are present, causing crowding of the pulmonary vasculature. Mildly elevated left hemidiaphragm with linear opacities at the left lung base favoring subsegmental atelectasis. The patient is rotated to the left on today's radiograph, reducing diagnostic sensitivity and specificity. No pleural effusion identified. The right lung appears clear. There is mild enlargement of the cardiopericardial silhouette. Mild thoracic spondylosis. IMPRESSION: 1. Mild enlargement of the cardiopericardial silhouette, without edema. 2. Mildly elevated left  hemidiaphragm with suspected subsegmental atelectasis in the left lower lobe. Electronically Signed   By: Van Clines M.D.   On: 02/17/2016 19:49   Ct Head Wo Contrast  02/17/2016  CLINICAL DATA:  Found in apartment disoriented, not making sense during conversation, tachycardia, tachypnea, last seen normal in church on Sunday, history diabetes mellitus, stroke EXAM: CT HEAD WITHOUT CONTRAST TECHNIQUE: Contiguous axial images were obtained from the base of the skull through the vertex without intravenous contrast. COMPARISON:  None FINDINGS: Generalized atrophy. Normal ventricular morphology. No midline shift or mass effect. Small vessel chronic ischemic changes of deep cerebral white matter. Age-indeterminate lacunar infarct LEFT thalamus. No intracranial hemorrhage, mass lesion, or additional infarction identified. Visualized paranasal sinuses and mastoid air cells clear. Bones unremarkable. IMPRESSION: Atrophy with small vessel chronic ischemic changes of deep cerebral white matter. Age-indeterminate lacunar infarct LEFT thalamus. No additional intracranial abnormalities. Electronically Signed   By: Lavonia Dana M.D.   On: 02/17/2016 14:50   Mr Jodene Nam Head Wo Contrast  02/17/2016  CLINICAL DATA:  Initial evaluation for acute memory difficulty. History of  prior CVA. EXAM: MRI HEAD WITHOUT CONTRAST MRA HEAD WITHOUT CONTRAST TECHNIQUE: Multiplanar, multiecho pulse sequences of the brain and surrounding structures were obtained without intravenous contrast. Angiographic images of the head were obtained using MRA technique without contrast. COMPARISON:  Prior CT from earlier the same day. FINDINGS: MRI HEAD FINDINGS Diffuse prominence of the CSF containing spaces is compatible with generalized age-related cerebral atrophy. Fairly prominent confluent T2/FLAIR hyperintensity within the knee white matter most consistent with chronic small vessel ischemic disease, moderate to advanced in nature. Remote lacunar  infarcts within the bilateral thalami, more prominent on the left. Small remote lacunar infarcts within the left pons as well. Probable additional tiny remote cerebellar infarcts noted as well. There is a 15 mm focus of restricted diffusion within the left ventral medial thalamus, consistent with acute ischemic infarct. No associated hemorrhage or mass effect. There is minimal punctate foci of restricted diffusion within the right thalamus/posterior limb of the right internal capsules well, which may knee also reflect tiny foci of ischemia (series 4, image 22). Subtle signal loss on ADC map present within this region. No other acute infarct. Major intracranial vascular flow voids are maintained. No acute or chronic intracranial hemorrhage. No mass lesion, midline shift, or mass effect. Ventricular prominence related to global parenchymal volume loss present without hydrocephalus. No extra-axial fluid collection. Major dural sinuses are patent. Craniocervical junction within normal limits. Pituitary gland normal.  No acute abnormality about the orbits. Paranasal sinuses are clear. Trace opacity within the left mastoid air cells. Inner ear structures grossly normal. Bone marrow signal intensity within normal limits. No scalp soft tissue abnormality. MRA HEAD FINDINGS ANTERIOR CIRCULATION: Visualized distal cervical segments of the internal carotid arteries are widely patent with antegrade flow. Petrous, cavernous, supraclinoid segments widely patent bilaterally. A1 segments, anterior communicating artery, and anterior cerebral arteries well opacified. M1 segments widely patent without stenosis or occlusion. MCA bifurcations within normal limits. Distal MCA branches well opacified and symmetric. POSTERIOR CIRCULATION: Vertebral arteries are patent to the vertebrobasilar junction. Posterior inferior cerebral arteries patent bilaterally. Basilar artery widely patent. Superior cerebellar arteries well opacified. Posterior  cerebral arteries both arise from the basilar artery and are well opacified to their distal aspects. No aneurysm or vascular malformation. IMPRESSION: MRI HEAD IMPRESSION: 1. 15 mm acute nonhemorrhagic ischemic infarct within the left ventromedial thalamus. 2. Punctate foci of restricted diffusion within the right thalamus and posterior limb of the right internal capsule, also suspicious for subtle ischemic changes. 3. Generalized cerebral atrophy with advanced chronic microvascular ischemic disease and multiple small remote lacunar infarcts as above. MRA HEAD IMPRESSION: Normal intracranial MRA. No large or proximal arterial branch occlusion. No high-grade or correctable stenosis. Electronically Signed   By: Jeannine Boga M.D.   On: 02/17/2016 22:21   Mr Brain Wo Contrast  02/17/2016  CLINICAL DATA:  Initial evaluation for acute memory difficulty. History of prior CVA. EXAM: MRI HEAD WITHOUT CONTRAST MRA HEAD WITHOUT CONTRAST TECHNIQUE: Multiplanar, multiecho pulse sequences of the brain and surrounding structures were obtained without intravenous contrast. Angiographic images of the head were obtained using MRA technique without contrast. COMPARISON:  Prior CT from earlier the same day. FINDINGS: MRI HEAD FINDINGS Diffuse prominence of the CSF containing spaces is compatible with generalized age-related cerebral atrophy. Fairly prominent confluent T2/FLAIR hyperintensity within the knee white matter most consistent with chronic small vessel ischemic disease, moderate to advanced in nature. Remote lacunar infarcts within the bilateral thalami, more prominent on the left. Small remote lacunar infarcts within  the left pons as well. Probable additional tiny remote cerebellar infarcts noted as well. There is a 15 mm focus of restricted diffusion within the left ventral medial thalamus, consistent with acute ischemic infarct. No associated hemorrhage or mass effect. There is minimal punctate foci of restricted  diffusion within the right thalamus/posterior limb of the right internal capsules well, which may knee also reflect tiny foci of ischemia (series 4, image 22). Subtle signal loss on ADC map present within this region. No other acute infarct. Major intracranial vascular flow voids are maintained. No acute or chronic intracranial hemorrhage. No mass lesion, midline shift, or mass effect. Ventricular prominence related to global parenchymal volume loss present without hydrocephalus. No extra-axial fluid collection. Major dural sinuses are patent. Craniocervical junction within normal limits. Pituitary gland normal.  No acute abnormality about the orbits. Paranasal sinuses are clear. Trace opacity within the left mastoid air cells. Inner ear structures grossly normal. Bone marrow signal intensity within normal limits. No scalp soft tissue abnormality. MRA HEAD FINDINGS ANTERIOR CIRCULATION: Visualized distal cervical segments of the internal carotid arteries are widely patent with antegrade flow. Petrous, cavernous, supraclinoid segments widely patent bilaterally. A1 segments, anterior communicating artery, and anterior cerebral arteries well opacified. M1 segments widely patent without stenosis or occlusion. MCA bifurcations within normal limits. Distal MCA branches well opacified and symmetric. POSTERIOR CIRCULATION: Vertebral arteries are patent to the vertebrobasilar junction. Posterior inferior cerebral arteries patent bilaterally. Basilar artery widely patent. Superior cerebellar arteries well opacified. Posterior cerebral arteries both arise from the basilar artery and are well opacified to their distal aspects. No aneurysm or vascular malformation. IMPRESSION: MRI HEAD IMPRESSION: 1. 15 mm acute nonhemorrhagic ischemic infarct within the left ventromedial thalamus. 2. Punctate foci of restricted diffusion within the right thalamus and posterior limb of the right internal capsule, also suspicious for subtle  ischemic changes. 3. Generalized cerebral atrophy with advanced chronic microvascular ischemic disease and multiple small remote lacunar infarcts as above. MRA HEAD IMPRESSION: Normal intracranial MRA. No large or proximal arterial branch occlusion. No high-grade or correctable stenosis. Electronically Signed   By: Jeannine Boga M.D.   On: 02/17/2016 22:21     Scheduled Meds: . atorvastatin  40 mg Oral q1800  . clopidogrel  75 mg Oral Daily  . enoxaparin (LOVENOX) injection  40 mg Subcutaneous Q24H  . insulin aspart  0-15 Units Subcutaneous TID WC  . insulin aspart  0-5 Units Subcutaneous QHS  . sertraline  100 mg Oral QHS   Continuous Infusions:    Marzetta Board, MD, PhD [facility] Pager [phone]  If 7PM-7AM, please contact night-coverage [email or web address] Password [facility] 02/19/2016, 12:56 PM

## 2016-02-20 DIAGNOSIS — R488 Other symbolic dysfunctions: Secondary | ICD-10-CM | POA: Diagnosis not present

## 2016-02-20 DIAGNOSIS — I63219 Cerebral infarction due to unspecified occlusion or stenosis of unspecified vertebral arteries: Secondary | ICD-10-CM | POA: Diagnosis not present

## 2016-02-20 DIAGNOSIS — M6281 Muscle weakness (generalized): Secondary | ICD-10-CM | POA: Diagnosis not present

## 2016-02-20 DIAGNOSIS — I10 Essential (primary) hypertension: Secondary | ICD-10-CM | POA: Diagnosis not present

## 2016-02-20 DIAGNOSIS — E119 Type 2 diabetes mellitus without complications: Secondary | ICD-10-CM | POA: Diagnosis not present

## 2016-02-20 DIAGNOSIS — G629 Polyneuropathy, unspecified: Secondary | ICD-10-CM | POA: Diagnosis not present

## 2016-02-20 DIAGNOSIS — F329 Major depressive disorder, single episode, unspecified: Secondary | ICD-10-CM | POA: Diagnosis not present

## 2016-02-20 DIAGNOSIS — I69311 Memory deficit following cerebral infarction: Secondary | ICD-10-CM | POA: Diagnosis not present

## 2016-02-20 DIAGNOSIS — R2681 Unsteadiness on feet: Secondary | ICD-10-CM | POA: Diagnosis not present

## 2016-02-20 DIAGNOSIS — R278 Other lack of coordination: Secondary | ICD-10-CM | POA: Diagnosis not present

## 2016-02-20 DIAGNOSIS — E1142 Type 2 diabetes mellitus with diabetic polyneuropathy: Secondary | ICD-10-CM | POA: Diagnosis not present

## 2016-02-20 DIAGNOSIS — E785 Hyperlipidemia, unspecified: Secondary | ICD-10-CM | POA: Diagnosis not present

## 2016-02-20 DIAGNOSIS — I739 Peripheral vascular disease, unspecified: Secondary | ICD-10-CM | POA: Diagnosis not present

## 2016-02-20 DIAGNOSIS — M62838 Other muscle spasm: Secondary | ICD-10-CM | POA: Diagnosis not present

## 2016-02-20 DIAGNOSIS — N3000 Acute cystitis without hematuria: Secondary | ICD-10-CM | POA: Diagnosis not present

## 2016-02-20 LAB — HEMOGLOBIN A1C
Hgb A1c MFr Bld: 6.2 % — ABNORMAL HIGH (ref 4.8–5.6)
Mean Plasma Glucose: 131 mg/dL

## 2016-02-20 LAB — GLUCOSE, CAPILLARY
GLUCOSE-CAPILLARY: 151 mg/dL — AB (ref 65–99)
GLUCOSE-CAPILLARY: 228 mg/dL — AB (ref 65–99)

## 2016-02-20 MED ORDER — CIPROFLOXACIN HCL 500 MG PO TABS: 500.0000 mg | ORAL_TABLET | Freq: Two times a day (BID) | ORAL | Status: DC

## 2016-02-20 MED ORDER — TRAMADOL HCL 50 MG PO TABS: ORAL_TABLET | ORAL | Status: DC

## 2016-02-20 MED ORDER — CLOPIDOGREL BISULFATE 75 MG PO TABS: 75.0000 mg | ORAL_TABLET | Freq: Every day | ORAL | Status: DC

## 2016-02-20 MED ORDER — ATORVASTATIN CALCIUM 40 MG PO TABS: 40.0000 mg | ORAL_TABLET | Freq: Every day | ORAL | Status: DC

## 2016-02-20 MED ORDER — LORAZEPAM 0.5 MG PO TABS: ORAL_TABLET | ORAL | Status: DC

## 2016-02-20 MED ORDER — DEXTROSE 5 % IV SOLN
1.0000 g | INTRAVENOUS | Status: DC
  Filled 2016-02-20: qty 10

## 2016-02-20 NOTE — Care Management Important Message (Signed)
Important Message  Patient Details  Name: Sheila [name] MRN: [id number] Date of Birth: 11-06-1951   Medicare Important Message Given:  Yes    Nathen May 02/20/2016, 11:41 AM

## 2016-02-20 NOTE — Clinical Social Work Placement (Signed)
   CLINICAL SOCIAL WORK PLACEMENT  NOTE  Date:  02/20/2016  Patient Details  Name: Sheila [name] MRN: [id number] Date of Birth: 07-01-1951  Clinical Social Work is seeking post-discharge placement for this patient at the [place] level of care (*CSW will initial, date and re-position this form in  chart as items are completed):  Yes   Patient/family provided with [place] Work Department's list of facilities offering this level of care within the geographic area requested by the patient (or if unable, by the patient's family).  Yes   Patient/family informed of their freedom to choose among providers that offer the needed level of care, that participate in Medicare, Medicaid or managed care program needed by the patient, have an available bed and are willing to accept the patient.  Yes   Patient/family informed of [facility]'s ownership interest in [facility] and [facility], as well as of the fact that they are under no obligation to receive care at these facilities.  PASRR submitted to EDS on 02/19/16     PASRR number received on 02/19/16     Existing PASRR number confirmed on       FL2 transmitted to all facilities in geographic area requested by pt/family on 02/19/16     FL2 transmitted to all facilities within larger geographic area on       Patient informed that his/her managed care company has contracts with or will negotiate with certain facilities, including the following:        Yes   Patient/family informed of bed offers received.  Patient chooses bed at [facility]     Physician recommends and patient chooses bed at      Patient to be transferred to [facility] on 02/20/16.  Patient to be transferred to facility by By car     Patient family notified on 02/20/16 of transfer.  Name of family member notified:  Son, Elta Guadeloupe     PHYSICIAN Please prepare prescriptions     Additional  Comment:    _______________________________________________ Benard Halsted, [place] 02/20/2016, 2:17 PM

## 2016-02-20 NOTE — Progress Notes (Signed)
Patient will DC to: [facility] Anticipated DC date: 02/20/16 Family notified: Son, Elta Guadeloupe Transport by: Daughter-in-law will transport by car  [place] signing off.  Cedric Fishman, [place] Social Worker [phone]

## 2016-02-20 NOTE — Progress Notes (Signed)
Nsg Discharge Note  Admit Date:  02/17/2016 Discharge date: 02/20/2016   Jeanine Luz Aamiyah Derrick to be D/C'd Nursing Home per MD order.  AVS completed.  Copy for chart, and copy for patient signed, and dated. Patient/caregiver able to verbalize understanding.  Discharge Medication:   Medication List    STOP taking these medications        aspirin 81 MG tablet      TAKE these medications        ACCU-CHEK AVIVA PLUS w/Device Kit  Use as directed once daily     ACCU-CHEK FASTCLIX LANCETS Misc  Use to help check blood sugars twice a day Dx E11.9     atorvastatin 40 MG tablet  Commonly known as:  LIPITOR  Take 1 tablet (40 mg total) by mouth daily at 6 PM.     ciprofloxacin 500 MG tablet  Commonly known as:  CIPRO  Take 1 tablet (500 mg total) by mouth 2 (two) times daily. For 5 days     clopidogrel 75 MG tablet  Commonly known as:  PLAVIX  Take 1 tablet (75 mg total) by mouth daily.     cyclobenzaprine 10 MG tablet  Commonly known as:  FLEXERIL  TAKE ONE TABLET BY MOUTH  THREE TIMES DAILY AS NEEDED FOR MUSCLE SPASM     fish oil-omega-3 fatty acids 1000 MG capsule  Take 1 g by mouth daily.     gabapentin 300 MG capsule  Commonly known as:  NEURONTIN  Take 1 capsule by mouth at  bedtime     glipiZIDE 10 MG tablet  Commonly known as:  GLUCOTROL  Take 1 tablet by mouth  daily before breakfast     glucose blood test strip  Commonly known as:  ACCU-CHEK AVIVA PLUS  1 each by Other route 2 (two) times daily. Use to check blood sugars twice a day Dx E11.9     LORazepam 0.5 MG tablet  Commonly known as:  ATIVAN  TAKE ONE TABLET BY MOUTH TWICE DAILY AS NEEDED FOR ANXIETY     magnesium 30 MG tablet  Take 30 mg by mouth daily.     meloxicam 15 MG tablet  Commonly known as:  MOBIC  Take 1 tablet by mouth  daily     metFORMIN 500 MG tablet  Commonly known as:  GLUCOPHAGE  Take 2 tablets by mouth  daily     multivitamin tablet  Take 1 tablet by mouth daily.     NONFORMULARY OR COMPOUNDED ITEM  AFO for R leg for dropped foot Dx history of cva     sertraline 100 MG tablet  Commonly known as:  ZOLOFT  Take 1 tablet by mouth  daily     traMADol 50 MG tablet  Commonly known as:  ULTRAM  TAKE ONE TABLET BY MOUTH EVERY 6 HOURS AS NEEDED FOR PAIN     UNISTIK 2 COMFORT Misc  Use 1 device to check blood sugar 1-2 times daily. Use new device for each test.        Discharge Assessment: Filed Vitals:   02/20/16 0616 02/20/16 1250  BP: 96/52 103/59  Pulse: 92 86  Temp: 98.6 F (37 C) 98.6 F (37 C)  Resp: 18 19   Skin clean, dry and intact without evidence of skin break down, no evidence of skin tears noted. IV catheter discontinued intact. Site without signs and symptoms of complications - no redness or edema noted at insertion site, patient denies c/o pain -  only slight tenderness at site.  Dressing with slight pressure applied.  D/c Instructions-Education: Discharge instructions given to patient/family with verbalized understanding. D/c education completed with patient/family including follow up instructions, medication list, d/c activities limitations if indicated, with other d/c instructions as indicated by MD - patient able to verbalize understanding, all questions fully answered. Patient instructed to return to ED, call 911, or call MD for any changes in condition.  Patient escorted via WC, and D/C home via private auto.  ,  [place]aine, RN 02/20/2016 5:03 PM 

## 2016-02-20 NOTE — Discharge Summary (Signed)
Physician Discharge Summary  Sheila [name] ITG:[id number] DOB: 03-04-1951 DOA: 02/17/2016  PCP: Mauricio Po, FNP  Admit date: 02/17/2016 Discharge date: 02/20/2016  Time spent: > 30 minutes  Recommendations for Outpatient Follow-up:  1. Follow up with Mauricio Po FNP in 1 week 2. Follow up with Dr. Jaynee Eagles in 1 month  3. Continue Ciprofloxacin for 5 days   Discharge Diagnoses:  Principal Problem:   Anomia Active Problems:   Hyperlipidemia   DM (diabetes mellitus) ([place])   Neuropathy ([place])   Peripheral arterial disease ([place])   Stroke ([place])   Stroke (cerebrum) ([place])  Discharge Condition: stable  Diet recommendation: heart healthy  Filed Weights   02/18/16 0157  Weight: 77.111 kg (170 lb)   History of present illness:  See H&P, Labs, Consult and Test reports for all details in brief, patient is a 65 year old female with history of diabetes, prior CVA, hyperlipidemia, admitted on 2/21 with confusion and memory problems, with MRI positive for CVA.  Hospital Course:  CVA / Anomia - neurology consulted, appreciate input, MRI positive for stroke.  - 2-D echo, normal EF, as below - carotid Dopplers done earlier this month, will not repeat, results as below - change to plavix per neurology, discontinue aspirin - on statin - Mental status fluctuates Hyperlipidemia - Continue statin  Bacteriuria /UTI - Patient is afebrile, without leukocytosis, without any urinary symptoms, unlikely to represent a true urinary tract infection. She is however a bit altered and would favor treating empirically with 5 days of Cipro. Diabetes mellitus - Continue sliding scale insulin. Hemoglobin A1c 6.2, at goal Tobacco abuse - counseled for cessation  Procedures:  2D echo Study Conclusions - Left ventricle: The cavity size was normal. Wall thickness wasincreased in a pattern of moderate LVH. Systolic function wasnormal. The estimated ejection fraction was in the range of 55%to 60%.  Wall motion was normal; there were no regional wallmotion abnormalities. Doppler parameters are consistent withabnormal left ventricular relaxation (grade 1 diastolicdysfunction). - Mitral valve: Mildly calcified annulus. Valve area by pressurehalf-time: 1.96 cm^2. - Left atrium: The atrium was mildly dilated. - Pulmonary arteries: Systolic pressure was mildly increased. PApeak pressure: 32 mm Hg (S).   Carotid doppler - done as an outpatient 2/10 Heterogeneous plaque in the right bifurcation, 1-39% RICA stenosis Normal LICA. Normal subclavian arteries, bilaterally. Patent vertebral arteries with antegrade flow.  Consultations:  Neurology   Discharge Exam: Filed Vitals:   02/19/16 1349 02/19/16 2209 02/20/16 0004 02/20/16 0616  BP: 122/69 114/61 117/55 96/52  Pulse: 97 88 96 92  Temp: 97.7 F (36.5 C) 98.8 F (37.1 C) 98.4 F (36.9 C) 98.6 F (37 C)  TempSrc: Oral Oral Oral Oral  Resp: _0 Height:      Weight:      SpO2: 100% 100% 100% 100%   General: NAD Cardiovascular: RRR Respiratory: CTA biL  Discharge Instructions Activity:  As tolerated   Get Medicines reviewed and adjusted: Please take all your medications with you for your next visit with your Primary MD  Please request your Primary MD to go over all hospital tests and procedure/radiological results at the follow up, please ask your Primary MD to get all Hospital records sent to his/her office.  If you experience worsening of your admission symptoms, develop shortness of breath, life threatening emergency, suicidal or homicidal thoughts you must seek medical attention immediately by calling 911 or calling your MD immediately if symptoms less severe.  You must read complete  instructions/literature along with all the possible adverse reactions/side effects for all the Medicines you take and that have been prescribed to you. Take any new Medicines after you have completely understood and accpet all  the possible adverse reactions/side effects.   Do not drive when taking Pain medications.   Do not take more than prescribed Pain, Sleep and Anxiety Medications  Special Instructions: If you have smoked or chewed Tobacco in the last 2 yrs please stop smoking, stop any regular Alcohol and or any Recreational drug use.  Wear Seat belts while driving.  Please note  You were cared for by a hospitalist during your hospital stay. Once you are discharged, your primary care physician will handle any further medical issues. Please note that NO REFILLS for any discharge medications will be authorized once you are discharged, as it is imperative that you return to your primary care physician (or establish a relationship with a primary care physician if you do not have one) for your aftercare needs so that they can reassess your need for medications and monitor your lab values.    Medication List    STOP taking these medications        aspirin 81 MG tablet      TAKE these medications        ACCU-CHEK AVIVA PLUS w/Device Kit  Use as directed once daily     ACCU-CHEK FASTCLIX LANCETS Misc  Use to help check blood sugars twice a day Dx E11.9     atorvastatin 20 MG tablet  Commonly known as:  LIPITOR  Take 1 tablet by mouth  daily     ciprofloxacin 500 MG tablet  Commonly known as:  CIPRO  Take 1 tablet (500 mg total) by mouth 2 (two) times daily. For 5 days     clopidogrel 75 MG tablet  Commonly known as:  PLAVIX  Take 1 tablet (75 mg total) by mouth daily.     cyclobenzaprine 10 MG tablet  Commonly known as:  FLEXERIL  TAKE ONE TABLET BY MOUTH  THREE TIMES DAILY AS NEEDED FOR MUSCLE SPASM     fish oil-omega-3 fatty acids 1000 MG capsule  Take 1 g by mouth daily.     gabapentin 300 MG capsule  Commonly known as:  NEURONTIN  Take 1 capsule by mouth at  bedtime     glipiZIDE 10 MG tablet  Commonly known as:  GLUCOTROL  Take 1 tablet by mouth  daily before breakfast      glucose blood test strip  Commonly known as:  ACCU-CHEK AVIVA PLUS  1 each by Other route 2 (two) times daily. Use to check blood sugars twice a day Dx E11.9     LORazepam 0.5 MG tablet  Commonly known as:  ATIVAN  TAKE ONE TABLET BY MOUTH TWICE DAILY AS NEEDED FOR ANXIETY     magnesium 30 MG tablet  Take 30 mg by mouth daily.     meloxicam 15 MG tablet  Commonly known as:  MOBIC  Take 1 tablet by mouth  daily     metFORMIN 500 MG tablet  Commonly known as:  GLUCOPHAGE  Take 2 tablets by mouth  daily     multivitamin tablet  Take 1 tablet by mouth daily.     NONFORMULARY OR COMPOUNDED ITEM  AFO for R leg for dropped foot Dx history of cva     sertraline 100 MG tablet  Commonly known as:  ZOLOFT  Take 1 tablet by mouth  daily     traMADol 50 MG tablet  Commonly known as:  ULTRAM  TAKE ONE TABLET BY MOUTH EVERY 6 HOURS AS NEEDED FOR PAIN     UNISTIK 2 COMFORT Misc  Use 1 device to check blood sugar 1-2 times daily. Use new device for each test.           Follow-up Information    Follow up with Mauricio Po, FNP. Schedule an appointment as soon as possible for a visit in 2 weeks.   Specialty:  Family Medicine   Contact information:   [employer] [address] [phone]       The results of significant diagnostics from this hospitalization (including imaging, microbiology, ancillary and laboratory) are listed below for reference.    Significant Diagnostic Studies: Dg Chest 2 View  02/17/2016  CLINICAL DATA:  Altered mental status.  Tachycardia.  Weakness. EXAM: CHEST  2 VIEW COMPARISON:  None. FINDINGS: Low lung volumes are present, causing crowding of the pulmonary vasculature. Mildly elevated left hemidiaphragm with linear opacities at the left lung base favoring subsegmental atelectasis. The patient is rotated to the left on today's radiograph, reducing diagnostic sensitivity and specificity. No pleural effusion identified. The right lung appears  clear. There is mild enlargement of the cardiopericardial silhouette. Mild thoracic spondylosis. IMPRESSION: 1. Mild enlargement of the cardiopericardial silhouette, without edema. 2. Mildly elevated left hemidiaphragm with suspected subsegmental atelectasis in the left lower lobe. Electronically Signed   By: Van Clines M.D.   On: 02/17/2016 19:49   Ct Head Wo Contrast  02/17/2016  CLINICAL DATA:  Found in apartment disoriented, not making sense during conversation, tachycardia, tachypnea, last seen normal in church on Sunday, history diabetes mellitus, stroke EXAM: CT HEAD WITHOUT CONTRAST TECHNIQUE: Contiguous axial images were obtained from the base of the skull through the vertex without intravenous contrast. COMPARISON:  None FINDINGS: Generalized atrophy. Normal ventricular morphology. No midline shift or mass effect. Small vessel chronic ischemic changes of deep cerebral white matter. Age-indeterminate lacunar infarct LEFT thalamus. No intracranial hemorrhage, mass lesion, or additional infarction identified. Visualized paranasal sinuses and mastoid air cells clear. Bones unremarkable. IMPRESSION: Atrophy with small vessel chronic ischemic changes of deep cerebral white matter. Age-indeterminate lacunar infarct LEFT thalamus. No additional intracranial abnormalities. Electronically Signed   By: Lavonia Dana M.D.   On: 02/17/2016 14:50   Mr Jodene Nam Head Wo Contrast  02/17/2016  CLINICAL DATA:  Initial evaluation for acute memory difficulty. History of prior CVA. EXAM: MRI HEAD WITHOUT CONTRAST MRA HEAD WITHOUT CONTRAST TECHNIQUE: Multiplanar, multiecho pulse sequences of the brain and surrounding structures were obtained without intravenous contrast. Angiographic images of the head were obtained using MRA technique without contrast. COMPARISON:  Prior CT from earlier the same day. FINDINGS: MRI HEAD FINDINGS Diffuse prominence of the CSF containing spaces is compatible with generalized age-related  cerebral atrophy. Fairly prominent confluent T2/FLAIR hyperintensity within the knee white matter most consistent with chronic small vessel ischemic disease, moderate to advanced in nature. Remote lacunar infarcts within the bilateral thalami, more prominent on the left. Small remote lacunar infarcts within the left pons as well. Probable additional tiny remote cerebellar infarcts noted as well. There is a 15 mm focus of restricted diffusion within the left ventral medial thalamus, consistent with acute ischemic infarct. No associated hemorrhage or mass effect. There is minimal punctate foci of restricted diffusion within the right thalamus/posterior limb of the right internal capsules well, which may knee also reflect tiny foci  of ischemia (series 4, image 22). Subtle signal loss on ADC map present within this region. No other acute infarct. Major intracranial vascular flow voids are maintained. No acute or chronic intracranial hemorrhage. No mass lesion, midline shift, or mass effect. Ventricular prominence related to global parenchymal volume loss present without hydrocephalus. No extra-axial fluid collection. Major dural sinuses are patent. Craniocervical junction within normal limits. Pituitary gland normal.  No acute abnormality about the orbits. Paranasal sinuses are clear. Trace opacity within the left mastoid air cells. Inner ear structures grossly normal. Bone marrow signal intensity within normal limits. No scalp soft tissue abnormality. MRA HEAD FINDINGS ANTERIOR CIRCULATION: Visualized distal cervical segments of the internal carotid arteries are widely patent with antegrade flow. Petrous, cavernous, supraclinoid segments widely patent bilaterally. A1 segments, anterior communicating artery, and anterior cerebral arteries well opacified. M1 segments widely patent without stenosis or occlusion. MCA bifurcations within normal limits. Distal MCA branches well opacified and symmetric. POSTERIOR CIRCULATION:  Vertebral arteries are patent to the vertebrobasilar junction. Posterior inferior cerebral arteries patent bilaterally. Basilar artery widely patent. Superior cerebellar arteries well opacified. Posterior cerebral arteries both arise from the basilar artery and are well opacified to their distal aspects. No aneurysm or vascular malformation. IMPRESSION: MRI HEAD IMPRESSION: 1. 15 mm acute nonhemorrhagic ischemic infarct within the left ventromedial thalamus. 2. Punctate foci of restricted diffusion within the right thalamus and posterior limb of the right internal capsule, also suspicious for subtle ischemic changes. 3. Generalized cerebral atrophy with advanced chronic microvascular ischemic disease and multiple small remote lacunar infarcts as above. MRA HEAD IMPRESSION: Normal intracranial MRA. No large or proximal arterial branch occlusion. No high-grade or correctable stenosis. Electronically Signed   By: Jeannine Boga M.D.   On: 02/17/2016 22:21   Mr Brain Wo Contrast  02/17/2016  CLINICAL DATA:  Initial evaluation for acute memory difficulty. History of prior CVA. EXAM: MRI HEAD WITHOUT CONTRAST MRA HEAD WITHOUT CONTRAST TECHNIQUE: Multiplanar, multiecho pulse sequences of the brain and surrounding structures were obtained without intravenous contrast. Angiographic images of the head were obtained using MRA technique without contrast. COMPARISON:  Prior CT from earlier the same day. FINDINGS: MRI HEAD FINDINGS Diffuse prominence of the CSF containing spaces is compatible with generalized age-related cerebral atrophy. Fairly prominent confluent T2/FLAIR hyperintensity within the knee white matter most consistent with chronic small vessel ischemic disease, moderate to advanced in nature. Remote lacunar infarcts within the bilateral thalami, more prominent on the left. Small remote lacunar infarcts within the left pons as well. Probable additional tiny remote cerebellar infarcts noted as well. There is  a 15 mm focus of restricted diffusion within the left ventral medial thalamus, consistent with acute ischemic infarct. No associated hemorrhage or mass effect. There is minimal punctate foci of restricted diffusion within the right thalamus/posterior limb of the right internal capsules well, which may knee also reflect tiny foci of ischemia (series 4, image 22). Subtle signal loss on ADC map present within this region. No other acute infarct. Major intracranial vascular flow voids are maintained. No acute or chronic intracranial hemorrhage. No mass lesion, midline shift, or mass effect. Ventricular prominence related to global parenchymal volume loss present without hydrocephalus. No extra-axial fluid collection. Major dural sinuses are patent. Craniocervical junction within normal limits. Pituitary gland normal.  No acute abnormality about the orbits. Paranasal sinuses are clear. Trace opacity within the left mastoid air cells. Inner ear structures grossly normal. Bone marrow signal intensity within normal limits. No scalp soft tissue abnormality. MRA  HEAD FINDINGS ANTERIOR CIRCULATION: Visualized distal cervical segments of the internal carotid arteries are widely patent with antegrade flow. Petrous, cavernous, supraclinoid segments widely patent bilaterally. A1 segments, anterior communicating artery, and anterior cerebral arteries well opacified. M1 segments widely patent without stenosis or occlusion. MCA bifurcations within normal limits. Distal MCA branches well opacified and symmetric. POSTERIOR CIRCULATION: Vertebral arteries are patent to the vertebrobasilar junction. Posterior inferior cerebral arteries patent bilaterally. Basilar artery widely patent. Superior cerebellar arteries well opacified. Posterior cerebral arteries both arise from the basilar artery and are well opacified to their distal aspects. No aneurysm or vascular malformation. IMPRESSION: MRI HEAD IMPRESSION: 1. 15 mm acute nonhemorrhagic  ischemic infarct within the left ventromedial thalamus. 2. Punctate foci of restricted diffusion within the right thalamus and posterior limb of the right internal capsule, also suspicious for subtle ischemic changes. 3. Generalized cerebral atrophy with advanced chronic microvascular ischemic disease and multiple small remote lacunar infarcts as above. MRA HEAD IMPRESSION: Normal intracranial MRA. No large or proximal arterial branch occlusion. No high-grade or correctable stenosis. Electronically Signed   By: Jeannine Boga M.D.   On: 02/17/2016 22:21   Mm Screening Breast Tomo Bilateral  02/13/2016  CLINICAL DATA:  Screening. EXAM: DIGITAL SCREENING BILATERAL MAMMOGRAM WITH 3D TOMO WITH CAD COMPARISON:  Previous exam(s). ACR Breast Density Category b: There are scattered areas of fibroglandular density. FINDINGS: There are no findings suspicious for malignancy. Images were processed with CAD. IMPRESSION: No mammographic evidence of malignancy. A result letter of this screening mammogram will be mailed directly to the patient. RECOMMENDATION: Screening mammogram in one year. (Code:SM-B-01Y) BI-RADS CATEGORY  1: Negative. Electronically Signed   By: Ammie Ferrier M.D.   On: 02/13/2016 15:01   Microbiology: No results found for this or any previous visit (from the past 240 hour(s)).   Labs: Basic Metabolic Panel:  Recent Labs Lab 02/17/16 1446  NA 134*  K 4.1  CL 102  CO2 20*  GLUCOSE 146*  BUN 14  CREATININE 1.19*  CALCIUM 9.4   Liver Function Tests:  Recent Labs Lab 02/17/16 1446  AST 27  ALT 20  ALKPHOS 105  BILITOT 0.5  PROT 7.8  ALBUMIN 4.2   CBC:  Recent Labs Lab 02/17/16 1446  WBC 9.6  NEUTROABS 6.5  HGB 14.4  HCT 40.8  MCV 86.8  PLT 348   CBG:  Recent Labs Lab 02/19/16 0804 02/19/16 1158 02/19/16 1704 02/19/16 2235 02/20/16 0817  GLUCAP 145* 210* 85 125* 151*    Signed:  Leylah Tarnow  [facility] 02/20/2016, 11:20 AM

## 2016-02-20 NOTE — Progress Notes (Signed)
OT Cancellation Note  Patient Details Name: Sheila [name] MRN: [id number] DOB: 1951-02-01   Cancelled Treatment:    Reason Eval/Treat Not Completed:  Pt with d/c plan to SNF later today, will defer OT eval to SNF.  Malka So 02/20/2016, 3:39 PM

## 2016-02-20 NOTE — Care Management Note (Signed)
Case Management Note  Patient Details  Name: Sheila [name] MRN: [id number] Date of Birth: August 14, 1951  Subjective/Objective:                    Action/Plan: Plan is for pt to d/c to SNF today. No further needs identified per CM.  Expected Discharge Date:    02/20/16             Expected Discharge Plan:    In-House Referral:  Clinical Social Work  Discharge planning Services  CM Consult  Post Acute Care Choice:    Choice offered to:     DME Arranged:    DME Agency:     HH Arranged:    [place] Agency:     Status of Service:  Completed, signed off  Medicare Important Message Given:   Date Medicare IM Given:    Medicare IM give by:    Date Additional Medicare IM Given:    Additional Medicare Important Message give by:     If discussed at [place] of Stay Meetings, dates discussed:    Additional Comments:  Sharin Mons, [place] [phone] 02/20/2016, 11:46 AM

## 2016-02-25 ENCOUNTER — Encounter: Payer: Self-pay | Admitting: Internal Medicine

## 2016-02-25 ENCOUNTER — Non-Acute Institutional Stay (SKILLED_NURSING_FACILITY): Payer: Medicare Other | Admitting: Internal Medicine

## 2016-02-25 DIAGNOSIS — N3000 Acute cystitis without hematuria: Secondary | ICD-10-CM | POA: Diagnosis not present

## 2016-02-25 DIAGNOSIS — F329 Major depressive disorder, single episode, unspecified: Secondary | ICD-10-CM

## 2016-02-25 DIAGNOSIS — E1142 Type 2 diabetes mellitus with diabetic polyneuropathy: Secondary | ICD-10-CM

## 2016-02-25 DIAGNOSIS — G629 Polyneuropathy, unspecified: Secondary | ICD-10-CM

## 2016-02-25 DIAGNOSIS — E785 Hyperlipidemia, unspecified: Secondary | ICD-10-CM

## 2016-02-25 DIAGNOSIS — F32A Depression, unspecified: Secondary | ICD-10-CM

## 2016-02-25 LAB — BASIC METABOLIC PANEL
BUN: 18 mg/dL (ref 4–21)
CREATININE: 1.3 mg/dL — AB (ref <=1.1)
GLUCOSE: 267 mg/dL
POTASSIUM: 4.3 mmol/L (ref 3.4–5.3)
Sodium: 135 mmol/L — AB (ref 137–147)

## 2016-02-25 LAB — CBC AND DIFFERENTIAL
HCT: 36 % (ref 36–46)
Hemoglobin: 12.1 g/dL (ref 12.0–16.0)
PLATELETS: 284 10*3/uL (ref 150–399)
WBC: 5.6 10*3/mL

## 2016-02-25 NOTE — Progress Notes (Deleted)
Provider:   Location:  [place] Room Number: 107 Place of Service:  SNF (31)  PCP: Mauricio Po, FNP Patient Care Team: [place], FNP as PCP - General (Family Medicine)  Extended Emergency Contact Information Primary Emergency Contact: [facility] Address: [address] [place] , [place] [address] [place] Mobile Phone: [phone] Relation: Son  Code Status: Full code Goals of Care: Advanced Directive information Advanced Directives 02/25/2016  Does patient have an advance directive? No  Would patient like information on creating an advanced directive? No - patient declined information      Chief Complaint  Patient presents with  . New Admit To SNF    HPI: Patient is a 65 y.o. female seen today for admission to  Past Medical History  Diagnosis Date  . Arthritis   . Diabetes mellitus     complicated by peripheral neuropathy  . Stroke [facility]) 2015    residual right foot drop  . Hyperlipidemia    Past Surgical History  Procedure Laterality Date  . Cholecystectomy    . Tonsillectomy      reports that she quit smoking about 24 years ago. Her smoking use included Cigarettes. She has never used smokeless tobacco. She reports that she does not drink alcohol or use illicit drugs. Social History   Social History  . Marital Status: Widowed    Spouse Name: N/A  . Number of Children: 3  . Years of Education: 16   Occupational History  . 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 Smoking status: Former Smoker    Types: Cigarettes    Quit date: 02/15/1992  . Smokeless tobacco: Never Used  . Alcohol Use: No     Comment: Socially  . Drug Use: No     Comment: SMOKES IT ANY TIME SHE CAN GET IT  . Sexual Activity:    Partners: Male   Other Topics Concern  . Not on file   Social History Narrative   Fun: Talk, church activities    Denies any religious beliefs effecting health  care.    Lives alone in a one story home.  Has 5 children.  Retired from [employer].    Functional Status Survey:    Family History  Problem Relation Age of Onset  . Diabetes Mother     Deceased  . Diabetes Brother   . Healthy Son     Health Maintenance  Topic Date Due  . Hepatitis C Screening  [birth year]1950  . HIV Screening  04/19/1965  . PAP SMEAR  04/20/1971  . ZOSTAVAX  04/19/2010  . OPHTHALMOLOGY EXAM  03/09/2013  . URINE MICROALBUMIN  01/22/2015  . PNA vac Low Risk Adult (1 of 2 - PCV13) 04/20/2015  . FOOT EXAM  10/09/2015  . COLONOSCOPY  03/09/2016 (Originally 04/19/2000)  . INFLUENZA VACCINE  03/11/2016 (Originally 07/28/2015)  . HEMOGLOBIN A1C  08/18/2016  . MAMMOGRAM  02/10/2018  . TETANUS/TDAP  10/08/2024  . DEXA SCAN  Completed    Allergies  Allergen Reactions  . Sulfa Antibiotics   . Paxil [Paroxetine Hcl] Other (See Comments)    Dreams/ nightmares      Medication List       This list is accurate as of: 02/25/16  1:17 PM.  Always use your most recent med list.  ACCU-CHEK AVIVA PLUS w/Device Kit  Use as directed once daily     ACCU-CHEK FASTCLIX LANCETS Misc  Use to help check blood sugars twice a day Dx E11.9     atorvastatin 40 MG tablet  Commonly known as:  LIPITOR  Take 1 tablet (40 mg total) by mouth daily at 6 PM.     ciprofloxacin 500 MG tablet  Commonly known as:  CIPRO  Take 1 tablet (500 mg total) by mouth 2 (two) times daily. For 5 days     clopidogrel 75 MG tablet  Commonly known as:  PLAVIX  Take 1 tablet (75 mg total) by mouth daily.     cyclobenzaprine 10 MG tablet  Commonly known as:  FLEXERIL  TAKE ONE TABLET BY MOUTH  THREE TIMES DAILY AS NEEDED FOR MUSCLE SPASM     fish oil-omega-3 fatty acids 1000 MG capsule  Take 1 g by mouth daily.     gabapentin 300 MG capsule  Commonly known as:  NEURONTIN  Take 1 capsule by mouth at  bedtime     glipiZIDE 10 MG tablet  Commonly known as:  GLUCOTROL    Take 1 tablet by mouth  daily before breakfast     glucose blood test strip  Commonly known as:  ACCU-CHEK AVIVA PLUS  1 each by Other route 2 (two) times daily. Use to check blood sugars twice a day Dx E11.9     LORazepam 0.5 MG tablet  Commonly known as:  ATIVAN  TAKE ONE TABLET BY MOUTH TWICE DAILY AS NEEDED FOR ANXIETY     magnesium 30 MG tablet  Take 30 mg by mouth daily.     meloxicam 15 MG tablet  Commonly known as:  MOBIC  Take 1 tablet by mouth  daily     metFORMIN 500 MG tablet  Commonly known as:  GLUCOPHAGE  Take 2 tablets by mouth  daily     multivitamin tablet  Take 1 tablet by mouth daily.     NONFORMULARY OR COMPOUNDED ITEM  AFO for R leg for dropped foot Dx history of cva     sertraline 100 MG tablet  Commonly known as:  ZOLOFT  Take 1 tablet by mouth  daily     traMADol 50 MG tablet  Commonly known as:  ULTRAM  TAKE ONE TABLET BY MOUTH EVERY 6 HOURS AS NEEDED FOR PAIN     UNISTIK 2 COMFORT Misc  Use 1 device to check blood sugar 1-2 times daily. Use new device for each test.        Review of Systems  Filed Vitals:   02/25/16 1303  BP: 110/67  Pulse: 83  Temp: 97.5 F (36.4 C)  TempSrc: Oral  Resp: 20   There is no weight on file to calculate BMI. Physical Exam  Labs reviewed: Basic Metabolic Panel:  Recent Labs  02/17/16 1446  NA 134*  K 4.1  CL 102  CO2 20*  GLUCOSE 146*  BUN 14  CREATININE 1.19*  CALCIUM 9.4   Liver Function Tests:  Recent Labs  02/17/16 1446  AST 27  ALT 20  ALKPHOS 105  BILITOT 0.5  PROT 7.8  ALBUMIN 4.2   No results for input(s): LIPASE, AMYLASE in the last 8760 hours. No results for input(s): AMMONIA in the last 8760 hours. CBC:  Recent Labs  09/05/15 1429 02/17/16 1446  WBC 8.7 9.6  NEUTROABS  --  6.5  HGB 13.5 14.4  HCT 39.3 40.8  MCV 90.5 86.8  PLT 302.0 348   Cardiac Enzymes: No results for input(s): CKTOTAL, CKMB, CKMBINDEX, TROPONINI in the last 8760 hours. BNP: Invalid  input(s): POCBNP Lab Results  Component Value Date   HGBA1C 6.2* 02/19/2016   Lab Results  Component Value Date   TSH 1.28 10/08/2014   Lab Results  Component Value Date   VITAMINB12 360 09/05/2015   No results found for: FOLATE No results found for: IRON, TIBC, FERRITIN  Imaging and Procedures obtained prior to SNF admission: Dg Chest 2 View  02/17/2016  CLINICAL DATA:  Altered mental status.  Tachycardia.  Weakness. EXAM: CHEST  2 VIEW COMPARISON:  None. FINDINGS: Low lung volumes are present, causing crowding of the pulmonary vasculature. Mildly elevated left hemidiaphragm with linear opacities at the left lung base favoring subsegmental atelectasis. The patient is rotated to the left on today's radiograph, reducing diagnostic sensitivity and specificity. No pleural effusion identified. The right lung appears clear. There is mild enlargement of the cardiopericardial silhouette. Mild thoracic spondylosis. IMPRESSION: 1. Mild enlargement of the cardiopericardial silhouette, without edema. 2. Mildly elevated left hemidiaphragm with suspected subsegmental atelectasis in the left lower lobe. Electronically Signed   By: Van Clines M.D.   On: 02/17/2016 19:49   Ct Head Wo Contrast  02/17/2016  CLINICAL DATA:  Found in apartment disoriented, not making sense during conversation, tachycardia, tachypnea, last seen normal in church on Sunday, history diabetes mellitus, stroke EXAM: CT HEAD WITHOUT CONTRAST TECHNIQUE: Contiguous axial images were obtained from the base of the skull through the vertex without intravenous contrast. COMPARISON:  None FINDINGS: Generalized atrophy. Normal ventricular morphology. No midline shift or mass effect. Small vessel chronic ischemic changes of deep cerebral white matter. Age-indeterminate lacunar infarct LEFT thalamus. No intracranial hemorrhage, mass lesion, or additional infarction identified. Visualized paranasal sinuses and mastoid air cells clear. Bones  unremarkable. IMPRESSION: Atrophy with small vessel chronic ischemic changes of deep cerebral white matter. Age-indeterminate lacunar infarct LEFT thalamus. No additional intracranial abnormalities. Electronically Signed   By: Lavonia Dana M.D.   On: 02/17/2016 14:50   Mr Jodene Nam Head Wo Contrast  02/17/2016  CLINICAL DATA:  Initial evaluation for acute memory difficulty. History of prior CVA. EXAM: MRI HEAD WITHOUT CONTRAST MRA HEAD WITHOUT CONTRAST TECHNIQUE: Multiplanar, multiecho pulse sequences of the brain and surrounding structures were obtained without intravenous contrast. Angiographic images of the head were obtained using MRA technique without contrast. COMPARISON:  Prior CT from earlier the same day. FINDINGS: MRI HEAD FINDINGS Diffuse prominence of the CSF containing spaces is compatible with generalized age-related cerebral atrophy. Fairly prominent confluent T2/FLAIR hyperintensity within the knee white matter most consistent with chronic small vessel ischemic disease, moderate to advanced in nature. Remote lacunar infarcts within the bilateral thalami, more prominent on the left. Small remote lacunar infarcts within the left pons as well. Probable additional tiny remote cerebellar infarcts noted as well. There is a 15 mm focus of restricted diffusion within the left ventral medial thalamus, consistent with acute ischemic infarct. No associated hemorrhage or mass effect. There is minimal punctate foci of restricted diffusion within the right thalamus/posterior limb of the right internal capsules well, which may knee also reflect tiny foci of ischemia (series 4, image 22). Subtle signal loss on ADC map present within this region. No other acute infarct. Major intracranial vascular flow voids are maintained. No acute or chronic intracranial hemorrhage. No mass lesion, midline shift, or mass effect. Ventricular prominence related to global parenchymal volume loss present  without hydrocephalus. No  extra-axial fluid collection. Major dural sinuses are patent. Craniocervical junction within normal limits. Pituitary gland normal.  No acute abnormality about the orbits. Paranasal sinuses are clear. Trace opacity within the left mastoid air cells. Inner ear structures grossly normal. Bone marrow signal intensity within normal limits. No scalp soft tissue abnormality. MRA HEAD FINDINGS ANTERIOR CIRCULATION: Visualized distal cervical segments of the internal carotid arteries are widely patent with antegrade flow. Petrous, cavernous, supraclinoid segments widely patent bilaterally. A1 segments, anterior communicating artery, and anterior cerebral arteries well opacified. M1 segments widely patent without stenosis or occlusion. MCA bifurcations within normal limits. Distal MCA branches well opacified and symmetric. POSTERIOR CIRCULATION: Vertebral arteries are patent to the vertebrobasilar junction. Posterior inferior cerebral arteries patent bilaterally. Basilar artery widely patent. Superior cerebellar arteries well opacified. Posterior cerebral arteries both arise from the basilar artery and are well opacified to their distal aspects. No aneurysm or vascular malformation. IMPRESSION: MRI HEAD IMPRESSION: 1. 15 mm acute nonhemorrhagic ischemic infarct within the left ventromedial thalamus. 2. Punctate foci of restricted diffusion within the right thalamus and posterior limb of the right internal capsule, also suspicious for subtle ischemic changes. 3. Generalized cerebral atrophy with advanced chronic microvascular ischemic disease and multiple small remote lacunar infarcts as above. MRA HEAD IMPRESSION: Normal intracranial MRA. No large or proximal arterial branch occlusion. No high-grade or correctable stenosis. Electronically Signed   By: Jeannine Boga M.D.   On: 02/17/2016 22:21   Mr Brain Wo Contrast  02/17/2016  CLINICAL DATA:  Initial evaluation for acute memory difficulty. History of prior CVA.  EXAM: MRI HEAD WITHOUT CONTRAST MRA HEAD WITHOUT CONTRAST TECHNIQUE: Multiplanar, multiecho pulse sequences of the brain and surrounding structures were obtained without intravenous contrast. Angiographic images of the head were obtained using MRA technique without contrast. COMPARISON:  Prior CT from earlier the same day. FINDINGS: MRI HEAD FINDINGS Diffuse prominence of the CSF containing spaces is compatible with generalized age-related cerebral atrophy. Fairly prominent confluent T2/FLAIR hyperintensity within the knee white matter most consistent with chronic small vessel ischemic disease, moderate to advanced in nature. Remote lacunar infarcts within the bilateral thalami, more prominent on the left. Small remote lacunar infarcts within the left pons as well. Probable additional tiny remote cerebellar infarcts noted as well. There is a 15 mm focus of restricted diffusion within the left ventral medial thalamus, consistent with acute ischemic infarct. No associated hemorrhage or mass effect. There is minimal punctate foci of restricted diffusion within the right thalamus/posterior limb of the right internal capsules well, which may knee also reflect tiny foci of ischemia (series 4, image 22). Subtle signal loss on ADC map present within this region. No other acute infarct. Major intracranial vascular flow voids are maintained. No acute or chronic intracranial hemorrhage. No mass lesion, midline shift, or mass effect. Ventricular prominence related to global parenchymal volume loss present without hydrocephalus. No extra-axial fluid collection. Major dural sinuses are patent. Craniocervical junction within normal limits. Pituitary gland normal.  No acute abnormality about the orbits. Paranasal sinuses are clear. Trace opacity within the left mastoid air cells. Inner ear structures grossly normal. Bone marrow signal intensity within normal limits. No scalp soft tissue abnormality. MRA HEAD FINDINGS ANTERIOR  CIRCULATION: Visualized distal cervical segments of the internal carotid arteries are widely patent with antegrade flow. Petrous, cavernous, supraclinoid segments widely patent bilaterally. A1 segments, anterior communicating artery, and anterior cerebral arteries well opacified. M1 segments widely patent without stenosis or occlusion. MCA bifurcations within normal  limits. Distal MCA branches well opacified and symmetric. POSTERIOR CIRCULATION: Vertebral arteries are patent to the vertebrobasilar junction. Posterior inferior cerebral arteries patent bilaterally. Basilar artery widely patent. Superior cerebellar arteries well opacified. Posterior cerebral arteries both arise from the basilar artery and are well opacified to their distal aspects. No aneurysm or vascular malformation. IMPRESSION: MRI HEAD IMPRESSION: 1. 15 mm acute nonhemorrhagic ischemic infarct within the left ventromedial thalamus. 2. Punctate foci of restricted diffusion within the right thalamus and posterior limb of the right internal capsule, also suspicious for subtle ischemic changes. 3. Generalized cerebral atrophy with advanced chronic microvascular ischemic disease and multiple small remote lacunar infarcts as above. MRA HEAD IMPRESSION: Normal intracranial MRA. No large or proximal arterial branch occlusion. No high-grade or correctable stenosis. Electronically Signed   By: Jeannine Boga M.D.   On: 02/17/2016 22:21    Assessment/Plan There are no diagnoses linked to this encounter.   Family/ staff Communication:   Labs/tests ordered:

## 2016-02-28 ENCOUNTER — Encounter: Payer: Self-pay | Admitting: Internal Medicine

## 2016-02-28 DIAGNOSIS — I6381 Other cerebral infarction due to occlusion or stenosis of small artery: Secondary | ICD-10-CM | POA: Insufficient documentation

## 2016-02-28 DIAGNOSIS — F329 Major depressive disorder, single episode, unspecified: Secondary | ICD-10-CM | POA: Insufficient documentation

## 2016-02-28 DIAGNOSIS — F32A Depression, unspecified: Secondary | ICD-10-CM | POA: Insufficient documentation

## 2016-02-28 DIAGNOSIS — N390 Urinary tract infection, site not specified: Secondary | ICD-10-CM | POA: Insufficient documentation

## 2016-02-28 DIAGNOSIS — I6322 Cerebral infarction due to unspecified occlusion or stenosis of basilar arteries: Secondary | ICD-10-CM

## 2016-02-28 DIAGNOSIS — I63219 Cerebral infarction due to unspecified occlusion or stenosis of unspecified vertebral arteries: Secondary | ICD-10-CM | POA: Insufficient documentation

## 2016-02-28 NOTE — Assessment & Plan Note (Signed)
SNF - cont zoloft 100 mg daily

## 2016-02-28 NOTE — Progress Notes (Signed)
MRN: [id number] Name: Sheila [name]  Sex: female Age: 65 y.o. DOB: 11/03/1951  [place] [facility]: [facility] Facility/Room:107 Level Of Care: SNF Provider: Inocencio Homes D Emergency Contacts: Extended Emergency Contact Information Primary Emergency Contact: [facility] Address: [place] , [place] [address] [place] Mobile Phone: [phone] Relation: Son  Code Status:   Allergies: Sulfa antibiotics and Paxil  Chief Complaint  Patient presents with  . New Admit To SNF    HPI: Patient is 65 y.o. female with history of diabetes, prior CVA, hyperlipidemia, admitted on 2/21 with confusion and memory problems. Pt was admitted to [facility] from 2/21-24 where she was dx with new CVA by MRI. Hospital stay was complicated bu a fluctuating MS and a possible UTI. Pt is admitted to SNF for generalized weakness for OT/PT. While at SNF pt will be followed for HLD, tx with lipitor and fish oil, DM , tx with glucophage and glipizide and depression, tx with zoloft.  Past Medical History  Diagnosis Date  . Arthritis   . Diabetes mellitus     complicated by peripheral neuropathy  . Stroke [facility]) 2015    residual right foot drop  . Hyperlipidemia     Past Surgical History  Procedure Laterality Date  . Cholecystectomy    . Tonsillectomy        Medication List       This list is accurate as of: 02/25/16 11:59 PM.  Always use your most recent med list.               ACCU-CHEK AVIVA PLUS w/Device Kit  Use as directed once daily     ACCU-CHEK FASTCLIX LANCETS Misc  Use to help check blood sugars twice a day Dx E11.9     atorvastatin 40 MG tablet  Commonly known as:  LIPITOR  Take 1 tablet (40 mg total) by mouth daily at 6 PM.     ciprofloxacin 500 MG tablet  Commonly known as:  CIPRO  Take 1 tablet (500 mg total) by mouth 2 (two) times daily. For 5 days     clopidogrel 75 MG tablet  Commonly known as:  PLAVIX  Take 1 tablet (75 mg total) by mouth  daily.     cyclobenzaprine 10 MG tablet  Commonly known as:  FLEXERIL  TAKE ONE TABLET BY MOUTH  THREE TIMES DAILY AS NEEDED FOR MUSCLE SPASM     fish oil-omega-3 fatty acids 1000 MG capsule  Take 1 g by mouth daily.     gabapentin 300 MG capsule  Commonly known as:  NEURONTIN  Take 1 capsule by mouth at  bedtime     glipiZIDE 10 MG tablet  Commonly known as:  GLUCOTROL  Take 1 tablet by mouth  daily before breakfast     glucose blood test strip  Commonly known as:  ACCU-CHEK AVIVA PLUS  1 each by Other route 2 (two) times daily. Use to check blood sugars twice a day Dx E11.9     LORazepam 0.5 MG tablet  Commonly known as:  ATIVAN  TAKE ONE TABLET BY MOUTH TWICE DAILY AS NEEDED FOR ANXIETY     magnesium 30 MG tablet  Take 30 mg by mouth daily.     meloxicam 15 MG tablet  Commonly known as:  MOBIC  Take 1 tablet by mouth  daily     metFORMIN 500 MG tablet  Commonly known as:  GLUCOPHAGE  Take 2 tablets by mouth  daily     multivitamin tablet  Take 1 tablet by mouth daily.     NONFORMULARY OR COMPOUNDED ITEM  AFO for R leg for dropped foot Dx history of cva     sertraline 100 MG tablet  Commonly known as:  ZOLOFT  Take 1 tablet by mouth  daily     traMADol 50 MG tablet  Commonly known as:  ULTRAM  TAKE ONE TABLET BY MOUTH EVERY 6 HOURS AS NEEDED FOR PAIN     UNISTIK 2 COMFORT Misc  Use 1 device to check blood sugar 1-2 times daily. Use new device for each test.        No orders of the defined types were placed in this encounter.    Immunization History  Administered Date(s) Administered  . Tdap 10/08/2014    Social History  Substance Use Topics  . Smoking status: Former Smoker    Types: Cigarettes    Quit date: 02/15/1992  . Smokeless tobacco: Never Used  . Alcohol Use: No     Comment: Socially    Family history is + DM   Review of Systems  DATA OBTAINED: from patient, nurse GENERAL:  no fevers, fatigue, appetite changes SKIN: No itching,  rash or wounds EYES: No eye pain, redness, discharge EARS: No earache, tinnitus, change in hearing NOSE: No congestion, drainage or bleeding  MOUTH/THROAT: No mouth or tooth pain, No sore throat RESPIRATORY: No cough, wheezing, SOB CARDIAC: No chest pain, palpitations, lower extremity edema  GI: No abdominal pain, No N/V/D or constipation, No heartburn or reflux  GU: No dysuria, frequency or urgency, or incontinence  MUSCULOSKELETAL: No unrelieved bone/joint pain NEUROLOGIC: No headache, dizziness or focal weakness PSYCHIATRIC: No c/o anxiety or sadness   Filed Vitals:   02/28/16 1330  BP: 110/67  Pulse: 83  Temp: 97.5 F (36.4 C)  Resp: 20    SpO2 Readings from Last 1 Encounters:  02/20/16 96%        Physical Exam  GENERAL APPEARANCE: Alert, conversant,  No acute distress.  SKIN: No diaphoresis rash HEAD: Normocephalic, atraumatic  EYES: Conjunctiva/lids clear. Pupils round, reactive. EOMs intact.  EARS: External exam WNL, canals clear. Hearing grossly normal.  NOSE: No deformity or discharge.  MOUTH/THROAT: Lips w/o lesions  RESPIRATORY: Breathing is even, unlabored. Lung sounds are clear   CARDIOVASCULAR: Heart RRR no murmurs, rubs or gallops. No peripheral edema.   GASTROINTESTINAL: Abdomen is soft, non-tender, not distended w/ normal bowel sounds. GENITOURINARY: Bladder non tender, not distended  MUSCULOSKELETAL: No abnormal joints or musculature NEUROLOGIC:  Cranial nerves 2-12 grossly intact. Moves all extremities  PSYCHIATRIC: very odd affect, no behavioral issues  Patient Active Problem List   Diagnosis Date Noted  . UTI (urinary tract infection) 02/28/2016  . Depression 02/28/2016  . Stroke (cerebrum) ([place])   . Anomia 02/17/2016  . Stroke ([place]) 02/17/2016  . Peripheral arterial disease ([place]) 12/03/2015  . Neuropathy ([place]) 09/05/2015  . Medicare annual wellness visit, subsequent 03/12/2015  . Routine general medical examination at a health care  facility 10/08/2014  . Cerumen impaction 10/08/2014  . Low back pain potentially associated with radiculopathy 03/01/2013  . Hyperlipidemia 02/15/2012  . DM (diabetes mellitus) ([place]) 02/15/2012    CBC    Component Value Date/Time   WBC 9.6 02/17/2016 1446   RBC 4.70 02/17/2016 1446   HGB 14.4 02/17/2016 1446   HCT 40.8 02/17/2016 1446   PLT 348 02/17/2016 1446  MCV 86.8 02/17/2016 1446   LYMPHSABS 2.1 02/17/2016 1446   MONOABS 0.6 02/17/2016 1446   EOSABS 0.1 02/17/2016 1446   BASOSABS 0.0 02/17/2016 1446    CMP     Component Value Date/Time   NA 134* 02/17/2016 1446   K 4.1 02/17/2016 1446   CL 102 02/17/2016 1446   CO2 20* 02/17/2016 1446   GLUCOSE 146* 02/17/2016 1446   BUN 14 02/17/2016 1446   CREATININE 1.19* 02/17/2016 1446   CALCIUM 9.4 02/17/2016 1446   PROT 7.8 02/17/2016 1446   ALBUMIN 4.2 02/17/2016 1446   AST 27 02/17/2016 1446   ALT 20 02/17/2016 1446   ALKPHOS 105 02/17/2016 1446   BILITOT 0.5 02/17/2016 1446   GFRNONAA 47* 02/17/2016 1446   GFRAA 54* 02/17/2016 1446    Lab Results  Component Value Date   HGBA1C 6.2* 02/19/2016     Dg Chest 2 View  02/17/2016  CLINICAL DATA:  Altered mental status.  Tachycardia.  Weakness. EXAM: CHEST  2 VIEW COMPARISON:  None. FINDINGS: Low lung volumes are present, causing crowding of the pulmonary vasculature. Mildly elevated left hemidiaphragm with linear opacities at the left lung base favoring subsegmental atelectasis. The patient is rotated to the left on today's radiograph, reducing diagnostic sensitivity and specificity. No pleural effusion identified. The right lung appears clear. There is mild enlargement of the cardiopericardial silhouette. Mild thoracic spondylosis. IMPRESSION: 1. Mild enlargement of the cardiopericardial silhouette, without edema. 2. Mildly elevated left hemidiaphragm with suspected subsegmental atelectasis in the left lower lobe. Electronically Signed   By: Van Clines M.D.   On:  02/17/2016 19:49   Ct Head Wo Contrast  02/17/2016  CLINICAL DATA:  Found in apartment disoriented, not making sense during conversation, tachycardia, tachypnea, last seen normal in church on Sunday, history diabetes mellitus, stroke EXAM: CT HEAD WITHOUT CONTRAST TECHNIQUE: Contiguous axial images were obtained from the base of the skull through the vertex without intravenous contrast. COMPARISON:  None FINDINGS: Generalized atrophy. Normal ventricular morphology. No midline shift or mass effect. Small vessel chronic ischemic changes of deep cerebral white matter. Age-indeterminate lacunar infarct LEFT thalamus. No intracranial hemorrhage, mass lesion, or additional infarction identified. Visualized paranasal sinuses and mastoid air cells clear. Bones unremarkable. IMPRESSION: Atrophy with small vessel chronic ischemic changes of deep cerebral white matter. Age-indeterminate lacunar infarct LEFT thalamus. No additional intracranial abnormalities. Electronically Signed   By: Lavonia Dana M.D.   On: 02/17/2016 14:50   Mr Jodene Nam Head Wo Contrast  02/17/2016  CLINICAL DATA:  Initial evaluation for acute memory difficulty. History of prior CVA. EXAM: MRI HEAD WITHOUT CONTRAST MRA HEAD WITHOUT CONTRAST TECHNIQUE: Multiplanar, multiecho pulse sequences of the brain and surrounding structures were obtained without intravenous contrast. Angiographic images of the head were obtained using MRA technique without contrast. COMPARISON:  Prior CT from earlier the same day. FINDINGS: MRI HEAD FINDINGS Diffuse prominence of the CSF containing spaces is compatible with generalized age-related cerebral atrophy. Fairly prominent confluent T2/FLAIR hyperintensity within the knee white matter most consistent with chronic small vessel ischemic disease, moderate to advanced in nature. Remote lacunar infarcts within the bilateral thalami, more prominent on the left. Small remote lacunar infarcts within the left pons as well. Probable  additional tiny remote cerebellar infarcts noted as well. There is a 15 mm focus of restricted diffusion within the left ventral medial thalamus, consistent with acute ischemic infarct. No associated hemorrhage or mass effect. There is minimal punctate foci of restricted diffusion within the right  thalamus/posterior limb of the right internal capsules well, which may knee also reflect tiny foci of ischemia (series 4, image 22). Subtle signal loss on ADC map present within this region. No other acute infarct. Major intracranial vascular flow voids are maintained. No acute or chronic intracranial hemorrhage. No mass lesion, midline shift, or mass effect. Ventricular prominence related to global parenchymal volume loss present without hydrocephalus. No extra-axial fluid collection. Major dural sinuses are patent. Craniocervical junction within normal limits. Pituitary gland normal.  No acute abnormality about the orbits. Paranasal sinuses are clear. Trace opacity within the left mastoid air cells. Inner ear structures grossly normal. Bone marrow signal intensity within normal limits. No scalp soft tissue abnormality. MRA HEAD FINDINGS ANTERIOR CIRCULATION: Visualized distal cervical segments of the internal carotid arteries are widely patent with antegrade flow. Petrous, cavernous, supraclinoid segments widely patent bilaterally. A1 segments, anterior communicating artery, and anterior cerebral arteries well opacified. M1 segments widely patent without stenosis or occlusion. MCA bifurcations within normal limits. Distal MCA branches well opacified and symmetric. POSTERIOR CIRCULATION: Vertebral arteries are patent to the vertebrobasilar junction. Posterior inferior cerebral arteries patent bilaterally. Basilar artery widely patent. Superior cerebellar arteries well opacified. Posterior cerebral arteries both arise from the basilar artery and are well opacified to their distal aspects. No aneurysm or vascular  malformation. IMPRESSION: MRI HEAD IMPRESSION: 1. 15 mm acute nonhemorrhagic ischemic infarct within the left ventromedial thalamus. 2. Punctate foci of restricted diffusion within the right thalamus and posterior limb of the right internal capsule, also suspicious for subtle ischemic changes. 3. Generalized cerebral atrophy with advanced chronic microvascular ischemic disease and multiple small remote lacunar infarcts as above. MRA HEAD IMPRESSION: Normal intracranial MRA. No large or proximal arterial branch occlusion. No high-grade or correctable stenosis. Electronically Signed   By: Jeannine Boga M.D.   On: 02/17/2016 22:21   Mr Brain Wo Contrast  02/17/2016  CLINICAL DATA:  Initial evaluation for acute memory difficulty. History of prior CVA. EXAM: MRI HEAD WITHOUT CONTRAST MRA HEAD WITHOUT CONTRAST TECHNIQUE: Multiplanar, multiecho pulse sequences of the brain and surrounding structures were obtained without intravenous contrast. Angiographic images of the head were obtained using MRA technique without contrast. COMPARISON:  Prior CT from earlier the same day. FINDINGS: MRI HEAD FINDINGS Diffuse prominence of the CSF containing spaces is compatible with generalized age-related cerebral atrophy. Fairly prominent confluent T2/FLAIR hyperintensity within the knee white matter most consistent with chronic small vessel ischemic disease, moderate to advanced in nature. Remote lacunar infarcts within the bilateral thalami, more prominent on the left. Small remote lacunar infarcts within the left pons as well. Probable additional tiny remote cerebellar infarcts noted as well. There is a 15 mm focus of restricted diffusion within the left ventral medial thalamus, consistent with acute ischemic infarct. No associated hemorrhage or mass effect. There is minimal punctate foci of restricted diffusion within the right thalamus/posterior limb of the right internal capsules well, which may knee also reflect tiny  foci of ischemia (series 4, image 22). Subtle signal loss on ADC map present within this region. No other acute infarct. Major intracranial vascular flow voids are maintained. No acute or chronic intracranial hemorrhage. No mass lesion, midline shift, or mass effect. Ventricular prominence related to global parenchymal volume loss present without hydrocephalus. No extra-axial fluid collection. Major dural sinuses are patent. Craniocervical junction within normal limits. Pituitary gland normal.  No acute abnormality about the orbits. Paranasal sinuses are clear. Trace opacity within the left mastoid air cells. Inner ear structures  grossly normal. Bone marrow signal intensity within normal limits. No scalp soft tissue abnormality. MRA HEAD FINDINGS ANTERIOR CIRCULATION: Visualized distal cervical segments of the internal carotid arteries are widely patent with antegrade flow. Petrous, cavernous, supraclinoid segments widely patent bilaterally. A1 segments, anterior communicating artery, and anterior cerebral arteries well opacified. M1 segments widely patent without stenosis or occlusion. MCA bifurcations within normal limits. Distal MCA branches well opacified and symmetric. POSTERIOR CIRCULATION: Vertebral arteries are patent to the vertebrobasilar junction. Posterior inferior cerebral arteries patent bilaterally. Basilar artery widely patent. Superior cerebellar arteries well opacified. Posterior cerebral arteries both arise from the basilar artery and are well opacified to their distal aspects. No aneurysm or vascular malformation. IMPRESSION: MRI HEAD IMPRESSION: 1. 15 mm acute nonhemorrhagic ischemic infarct within the left ventromedial thalamus. 2. Punctate foci of restricted diffusion within the right thalamus and posterior limb of the right internal capsule, also suspicious for subtle ischemic changes. 3. Generalized cerebral atrophy with advanced chronic microvascular ischemic disease and multiple small  remote lacunar infarcts as above. MRA HEAD IMPRESSION: Normal intracranial MRA. No large or proximal arterial branch occlusion. No high-grade or correctable stenosis. Electronically Signed   By: Jeannine Boga M.D.   On: 02/17/2016 22:21    Not all labs, radiology exams or other studies done during hospitalization come through on my EPIC note; however they are reviewed by me.    Assessment and Plan  Stroke (cerebrum) ([place]) Anomia - neurology consulted, appreciate input, MRI positive for stroke L thalamus - 2-D echo, normal EF, as below - carotid Dopplers done earlier this month, will not repeat, results as below - change to plavix per neurology, discontinue aspirin - on statin - Mental status fluctuates SNF - cont plavix and lipitor  UTI (urinary tract infection) - Patient is afebrile, without leukocytosis, without any urinary symptoms, unlikely to represent a true urinary tract infection. She is however a bit altered  SNF - tx empirically with 5 days of Cipro.  DM (diabetes mellitus) ([place]) SNF - pt was pur back on glucophage an glipizide with A1c 6.1; on statin  Hyperlipidemia SNF - LDL 92, HDL 33 on lipitor 40 mg ;plan - cont same  Depression SNF - cont zoloft 100 mg daily  Neuropathy (HCC) SNF - not stated as uncontrolled;cont neurontin 300 mg qHS   Time spent > 45 min;> 50% of time with patient was spent reviewing records, labs, tests and studies, counseling and developing plan of care  Hennie Duos, MD

## 2016-02-28 NOTE — Assessment & Plan Note (Addendum)
Anomia - neurology consulted, appreciate input, MRI positive for stroke L thalamus - 2-D echo, normal EF, as below - carotid Dopplers done earlier this month, will not repeat, results as below - change to plavix per neurology, discontinue aspirin - on statin - Mental status fluctuates SNF - cont plavix and lipitor

## 2016-02-28 NOTE — Assessment & Plan Note (Signed)
-   Patient is afebrile, without leukocytosis, without any urinary symptoms, unlikely to represent a true urinary tract infection. She is however a bit altered  SNF - tx empirically with 5 days of Cipro.

## 2016-02-28 NOTE — Assessment & Plan Note (Signed)
SNF - LDL 92, HDL 33 on lipitor 40 mg ;plan - cont same

## 2016-02-28 NOTE — Assessment & Plan Note (Signed)
SNF - pt was pur back on glucophage an glipizide with A1c 6.1; on statin

## 2016-02-28 NOTE — Assessment & Plan Note (Signed)
SNF - not stated as uncontrolled;cont neurontin 300 mg qHS

## 2016-03-09 ENCOUNTER — Non-Acute Institutional Stay (SKILLED_NURSING_FACILITY): Payer: Medicare Other | Admitting: Internal Medicine

## 2016-03-09 ENCOUNTER — Encounter: Payer: Self-pay | Admitting: Internal Medicine

## 2016-03-09 DIAGNOSIS — G629 Polyneuropathy, unspecified: Secondary | ICD-10-CM

## 2016-03-09 DIAGNOSIS — E785 Hyperlipidemia, unspecified: Secondary | ICD-10-CM | POA: Diagnosis not present

## 2016-03-09 DIAGNOSIS — I6381 Other cerebral infarction due to occlusion or stenosis of small artery: Secondary | ICD-10-CM

## 2016-03-09 DIAGNOSIS — F32A Depression, unspecified: Secondary | ICD-10-CM

## 2016-03-09 DIAGNOSIS — F329 Major depressive disorder, single episode, unspecified: Secondary | ICD-10-CM | POA: Diagnosis not present

## 2016-03-09 DIAGNOSIS — N3000 Acute cystitis without hematuria: Secondary | ICD-10-CM | POA: Diagnosis not present

## 2016-03-09 DIAGNOSIS — I6322 Cerebral infarction due to unspecified occlusion or stenosis of basilar arteries: Secondary | ICD-10-CM

## 2016-03-09 DIAGNOSIS — E1142 Type 2 diabetes mellitus with diabetic polyneuropathy: Secondary | ICD-10-CM

## 2016-03-09 DIAGNOSIS — I63219 Cerebral infarction due to unspecified occlusion or stenosis of unspecified vertebral arteries: Secondary | ICD-10-CM | POA: Diagnosis not present

## 2016-03-09 NOTE — Progress Notes (Signed)
MRN: [id number] Name: Sheila [name]  Sex: female Age: 65 y.o. DOB: [birth year]  [place] [facility]: [facility] Facility/Room:107 Level Of Care: SNF Provider:Brinley Rosete, Webb Silversmith MD Emergency Contacts: Extended Emergency Contact Information Primary Emergency Contact: [facility] Address: [place] , [place] [address] [place] Mobile Phone: [phone] Relation: Son  Code Status:   Allergies: Sulfa antibiotics and Paxil  Chief Complaint  Patient presents with  . Discharge Note    HPI: Patient is 65 y.o. female with history of diabetes, prior CVA, hyperlipidemia with confusion and memory problems. Pt was admitted to [facility] from 2/21-24 where she was dx with new CVA by MRI. Hospital stay was complicated bu a fluctuating MS and a possible UTI. Pt was admitted to SNF for generalized weakness and is now ready to be d/c to [name].  Past Medical History  Diagnosis Date  . Arthritis   . Diabetes mellitus     complicated by peripheral neuropathy  . Stroke [facility]) 2015    residual right foot drop  . Hyperlipidemia     Past Surgical History  Procedure Laterality Date  . Cholecystectomy    . Tonsillectomy        Medication List       This list is accurate as of: 03/09/16  3:48 PM.  Always use your most recent med list.               ACCU-CHEK AVIVA PLUS w/Device Kit  Use as directed once daily     ACCU-CHEK FASTCLIX LANCETS Misc  Use to help check blood sugars twice a day Dx E11.9     atorvastatin 40 MG tablet  Commonly known as:  LIPITOR  Take 1 tablet (40 mg total) by mouth daily at 6 PM.     clopidogrel 75 MG tablet  Commonly known as:  PLAVIX  Take 1 tablet (75 mg total) by mouth daily.     cyclobenzaprine 10 MG tablet  Commonly known as:  FLEXERIL  TAKE ONE TABLET BY MOUTH  THREE TIMES DAILY AS NEEDED FOR MUSCLE SPASM     fish oil-omega-3 fatty acids 1000 MG capsule  Take 1 g by mouth daily.     gabapentin 300 MG capsule  Commonly known  as:  NEURONTIN  Take 1 capsule by mouth at  bedtime     glipiZIDE 10 MG tablet  Commonly known as:  GLUCOTROL  Take 1 tablet by mouth  daily before breakfast     glucose blood test strip  Commonly known as:  ACCU-CHEK AVIVA PLUS  1 each by Other route 2 (two) times daily. Use to check blood sugars twice a day Dx E11.9     LORazepam 0.5 MG tablet  Commonly known as:  ATIVAN  TAKE ONE TABLET BY MOUTH TWICE DAILY AS NEEDED FOR ANXIETY     magnesium 30 MG tablet  Take 30 mg by mouth daily.     meloxicam 15 MG tablet  Commonly known as:  MOBIC  Take 1 tablet by mouth  daily     metFORMIN 500 MG tablet  Commonly known as:  GLUCOPHAGE  Take 2 tablets by mouth  daily     multivitamin tablet  Take 1 tablet by mouth daily.     NONFORMULARY OR COMPOUNDED ITEM  AFO for R leg for dropped foot Dx history of cva     sertraline 100 MG tablet  Commonly known as:  ZOLOFT  Take 1 tablet by mouth  daily     traMADol 50 MG tablet  Commonly known as:  ULTRAM  TAKE ONE TABLET BY MOUTH EVERY 6 HOURS AS NEEDED FOR PAIN     UNISTIK 2 COMFORT Misc  Use 1 device to check blood sugar 1-2 times daily. Use new device for each test.        No orders of the defined types were placed in this encounter.    Immunization History  Administered Date(s) Administered  . Tdap 10/08/2014    Social History  Substance Use Topics  . Smoking status: Former Smoker    Types: Cigarettes    Quit date: 02/15/1992  . Smokeless tobacco: Never Used  . Alcohol Use: No     Comment: Socially    Filed Vitals:   03/09/16 1042  BP: 122/64  Pulse: 80  Temp: 97 F (36.1 C)  Resp: 20    Physical Exam  GENERAL APPEARANCE: Alert, conversant. No acute distress.  HEENT: Unremarkable. RESPIRATORY: Breathing is even, unlabored. Lung sounds are clear   CARDIOVASCULAR: Heart RRR no murmurs, rubs or gallops. No peripheral edema.  GASTROINTESTINAL: Abdomen is soft, non-tender, not distended w/ normal bowel  sounds.  NEUROLOGIC: Cranial nerves 2-12 grossly intact. Moves all extremities  Patient Active Problem List   Diagnosis Date Noted  . UTI (urinary tract infection) 02/28/2016  . Depression 02/28/2016  . Acute ischemic VBA thalamic stroke ([place])   . Stroke (cerebrum) ([place])   . Anomia 02/17/2016  . Stroke ([place]) 02/17/2016  . Peripheral arterial disease ([place]) 12/03/2015  . Neuropathy ([place]) 09/05/2015  . Medicare annual wellness visit, subsequent 03/12/2015  . Routine general medical examination at a health care facility 10/08/2014  . Cerumen impaction 10/08/2014  . Low back pain potentially associated with radiculopathy 03/01/2013  . Hyperlipidemia 02/15/2012  . DM (diabetes mellitus) ([place]) 02/15/2012    CBC    Component Value Date/Time   WBC 5.6 02/25/2016   WBC 9.6 02/17/2016 1446   RBC 4.70 02/17/2016 1446   HGB 12.1 02/25/2016   HCT 36 02/25/2016   PLT 284 02/25/2016   MCV 86.8 02/17/2016 1446   LYMPHSABS 2.1 02/17/2016 1446   MONOABS 0.6 02/17/2016 1446   EOSABS 0.1 02/17/2016 1446   BASOSABS 0.0 02/17/2016 1446      CMP     Component Value Date/Time   NA 135* 02/25/2016   NA 134* 02/17/2016 1446   K 4.3 02/25/2016   CL 102 02/17/2016 1446   CO2 20* 02/17/2016 1446   GLUCOSE 146* 02/17/2016 1446   BUN 18 02/25/2016   BUN 14 02/17/2016 1446   CREATININE 1.3* 02/25/2016   CREATININE 1.19* 02/17/2016 1446   CALCIUM 9.4 02/17/2016 1446   PROT 7.8 02/17/2016 1446   ALBUMIN 4.2 02/17/2016 1446   AST 27 02/17/2016 1446   ALT 20 02/17/2016 1446   ALKPHOS 105 02/17/2016 1446   BILITOT 0.5 02/17/2016 1446   GFRNONAA 47* 02/17/2016 1446   GFRAA 54* 02/17/2016 1446    Assessment and Plan  Pt is d/c to [name] with HH/OT/PT/nursing. Medications have been reconciled and Rx's written.   Time spent . 30 min;> 50% of time with patient was spent reviewing records, labs, tests and studies, counseling and developing plan of care  Inocencio Homes  MD

## 2016-03-11 DIAGNOSIS — M150 Primary generalized (osteo)arthritis: Secondary | ICD-10-CM | POA: Diagnosis not present

## 2016-03-11 DIAGNOSIS — Z7984 Long term (current) use of oral hypoglycemic drugs: Secondary | ICD-10-CM | POA: Diagnosis not present

## 2016-03-11 DIAGNOSIS — E1142 Type 2 diabetes mellitus with diabetic polyneuropathy: Secondary | ICD-10-CM | POA: Diagnosis not present

## 2016-03-11 DIAGNOSIS — M545 Low back pain: Secondary | ICD-10-CM | POA: Diagnosis not present

## 2016-03-11 DIAGNOSIS — Z9181 History of falling: Secondary | ICD-10-CM | POA: Diagnosis not present

## 2016-03-11 DIAGNOSIS — Z87891 Personal history of nicotine dependence: Secondary | ICD-10-CM | POA: Diagnosis not present

## 2016-03-11 DIAGNOSIS — M21371 Foot drop, right foot: Secondary | ICD-10-CM | POA: Diagnosis not present

## 2016-03-11 DIAGNOSIS — I69398 Other sequelae of cerebral infarction: Secondary | ICD-10-CM | POA: Diagnosis not present

## 2016-03-11 DIAGNOSIS — Z7982 Long term (current) use of aspirin: Secondary | ICD-10-CM | POA: Diagnosis not present

## 2016-03-11 DIAGNOSIS — R2689 Other abnormalities of gait and mobility: Secondary | ICD-10-CM | POA: Diagnosis not present

## 2016-03-12 ENCOUNTER — Telehealth: Payer: Self-pay

## 2016-03-12 DIAGNOSIS — M150 Primary generalized (osteo)arthritis: Secondary | ICD-10-CM | POA: Diagnosis not present

## 2016-03-12 DIAGNOSIS — R2689 Other abnormalities of gait and mobility: Secondary | ICD-10-CM | POA: Diagnosis not present

## 2016-03-12 DIAGNOSIS — Z87891 Personal history of nicotine dependence: Secondary | ICD-10-CM | POA: Diagnosis not present

## 2016-03-12 DIAGNOSIS — M21371 Foot drop, right foot: Secondary | ICD-10-CM | POA: Diagnosis not present

## 2016-03-12 DIAGNOSIS — I69398 Other sequelae of cerebral infarction: Secondary | ICD-10-CM | POA: Diagnosis not present

## 2016-03-12 DIAGNOSIS — M545 Low back pain: Secondary | ICD-10-CM | POA: Diagnosis not present

## 2016-03-12 DIAGNOSIS — E1142 Type 2 diabetes mellitus with diabetic polyneuropathy: Secondary | ICD-10-CM | POA: Diagnosis not present

## 2016-03-12 DIAGNOSIS — Z9181 History of falling: Secondary | ICD-10-CM | POA: Diagnosis not present

## 2016-03-12 DIAGNOSIS — Z7984 Long term (current) use of oral hypoglycemic drugs: Secondary | ICD-10-CM | POA: Diagnosis not present

## 2016-03-12 DIAGNOSIS — Z7982 Long term (current) use of aspirin: Secondary | ICD-10-CM | POA: Diagnosis not present

## 2016-03-12 NOTE — Telephone Encounter (Signed)
Sharyn Lull, Occupational therapist from [facility], called for a verbal order to continue OT. Verbal order given.

## 2016-03-16 ENCOUNTER — Inpatient Hospital Stay: Payer: Self-pay | Admitting: Family

## 2016-03-16 DIAGNOSIS — Z87891 Personal history of nicotine dependence: Secondary | ICD-10-CM | POA: Diagnosis not present

## 2016-03-16 DIAGNOSIS — Z9181 History of falling: Secondary | ICD-10-CM | POA: Diagnosis not present

## 2016-03-16 DIAGNOSIS — Z7982 Long term (current) use of aspirin: Secondary | ICD-10-CM | POA: Diagnosis not present

## 2016-03-16 DIAGNOSIS — M21371 Foot drop, right foot: Secondary | ICD-10-CM | POA: Diagnosis not present

## 2016-03-16 DIAGNOSIS — Z7984 Long term (current) use of oral hypoglycemic drugs: Secondary | ICD-10-CM | POA: Diagnosis not present

## 2016-03-16 DIAGNOSIS — M150 Primary generalized (osteo)arthritis: Secondary | ICD-10-CM | POA: Diagnosis not present

## 2016-03-16 DIAGNOSIS — E1142 Type 2 diabetes mellitus with diabetic polyneuropathy: Secondary | ICD-10-CM | POA: Diagnosis not present

## 2016-03-16 DIAGNOSIS — M545 Low back pain: Secondary | ICD-10-CM | POA: Diagnosis not present

## 2016-03-16 DIAGNOSIS — I69398 Other sequelae of cerebral infarction: Secondary | ICD-10-CM | POA: Diagnosis not present

## 2016-03-16 DIAGNOSIS — R2689 Other abnormalities of gait and mobility: Secondary | ICD-10-CM | POA: Diagnosis not present

## 2016-03-17 DIAGNOSIS — I69398 Other sequelae of cerebral infarction: Secondary | ICD-10-CM | POA: Diagnosis not present

## 2016-03-17 DIAGNOSIS — Z9181 History of falling: Secondary | ICD-10-CM | POA: Diagnosis not present

## 2016-03-17 DIAGNOSIS — R2689 Other abnormalities of gait and mobility: Secondary | ICD-10-CM | POA: Diagnosis not present

## 2016-03-17 DIAGNOSIS — M150 Primary generalized (osteo)arthritis: Secondary | ICD-10-CM | POA: Diagnosis not present

## 2016-03-17 DIAGNOSIS — Z7984 Long term (current) use of oral hypoglycemic drugs: Secondary | ICD-10-CM | POA: Diagnosis not present

## 2016-03-17 DIAGNOSIS — M545 Low back pain: Secondary | ICD-10-CM | POA: Diagnosis not present

## 2016-03-17 DIAGNOSIS — M21371 Foot drop, right foot: Secondary | ICD-10-CM | POA: Diagnosis not present

## 2016-03-17 DIAGNOSIS — Z87891 Personal history of nicotine dependence: Secondary | ICD-10-CM | POA: Diagnosis not present

## 2016-03-17 DIAGNOSIS — Z7982 Long term (current) use of aspirin: Secondary | ICD-10-CM | POA: Diagnosis not present

## 2016-03-17 DIAGNOSIS — E1142 Type 2 diabetes mellitus with diabetic polyneuropathy: Secondary | ICD-10-CM | POA: Diagnosis not present

## 2016-03-18 DIAGNOSIS — R2689 Other abnormalities of gait and mobility: Secondary | ICD-10-CM | POA: Diagnosis not present

## 2016-03-18 DIAGNOSIS — E1142 Type 2 diabetes mellitus with diabetic polyneuropathy: Secondary | ICD-10-CM | POA: Diagnosis not present

## 2016-03-18 DIAGNOSIS — M21371 Foot drop, right foot: Secondary | ICD-10-CM | POA: Diagnosis not present

## 2016-03-18 DIAGNOSIS — Z87891 Personal history of nicotine dependence: Secondary | ICD-10-CM | POA: Diagnosis not present

## 2016-03-18 DIAGNOSIS — Z7984 Long term (current) use of oral hypoglycemic drugs: Secondary | ICD-10-CM | POA: Diagnosis not present

## 2016-03-18 DIAGNOSIS — I69398 Other sequelae of cerebral infarction: Secondary | ICD-10-CM | POA: Diagnosis not present

## 2016-03-18 DIAGNOSIS — Z9181 History of falling: Secondary | ICD-10-CM | POA: Diagnosis not present

## 2016-03-18 DIAGNOSIS — M150 Primary generalized (osteo)arthritis: Secondary | ICD-10-CM | POA: Diagnosis not present

## 2016-03-18 DIAGNOSIS — M545 Low back pain: Secondary | ICD-10-CM | POA: Diagnosis not present

## 2016-03-18 DIAGNOSIS — Z7982 Long term (current) use of aspirin: Secondary | ICD-10-CM | POA: Diagnosis not present

## 2016-03-19 DIAGNOSIS — Z7984 Long term (current) use of oral hypoglycemic drugs: Secondary | ICD-10-CM | POA: Diagnosis not present

## 2016-03-19 DIAGNOSIS — Z9181 History of falling: Secondary | ICD-10-CM | POA: Diagnosis not present

## 2016-03-19 DIAGNOSIS — R2689 Other abnormalities of gait and mobility: Secondary | ICD-10-CM | POA: Diagnosis not present

## 2016-03-19 DIAGNOSIS — M21371 Foot drop, right foot: Secondary | ICD-10-CM | POA: Diagnosis not present

## 2016-03-19 DIAGNOSIS — E1142 Type 2 diabetes mellitus with diabetic polyneuropathy: Secondary | ICD-10-CM | POA: Diagnosis not present

## 2016-03-19 DIAGNOSIS — I69398 Other sequelae of cerebral infarction: Secondary | ICD-10-CM | POA: Diagnosis not present

## 2016-03-19 DIAGNOSIS — Z87891 Personal history of nicotine dependence: Secondary | ICD-10-CM | POA: Diagnosis not present

## 2016-03-19 DIAGNOSIS — M150 Primary generalized (osteo)arthritis: Secondary | ICD-10-CM | POA: Diagnosis not present

## 2016-03-19 DIAGNOSIS — Z7982 Long term (current) use of aspirin: Secondary | ICD-10-CM | POA: Diagnosis not present

## 2016-03-19 DIAGNOSIS — M545 Low back pain: Secondary | ICD-10-CM | POA: Diagnosis not present

## 2016-03-20 DIAGNOSIS — Z7982 Long term (current) use of aspirin: Secondary | ICD-10-CM | POA: Diagnosis not present

## 2016-03-20 DIAGNOSIS — M545 Low back pain: Secondary | ICD-10-CM | POA: Diagnosis not present

## 2016-03-20 DIAGNOSIS — R2689 Other abnormalities of gait and mobility: Secondary | ICD-10-CM | POA: Diagnosis not present

## 2016-03-20 DIAGNOSIS — Z7984 Long term (current) use of oral hypoglycemic drugs: Secondary | ICD-10-CM | POA: Diagnosis not present

## 2016-03-20 DIAGNOSIS — Z87891 Personal history of nicotine dependence: Secondary | ICD-10-CM | POA: Diagnosis not present

## 2016-03-20 DIAGNOSIS — E1142 Type 2 diabetes mellitus with diabetic polyneuropathy: Secondary | ICD-10-CM | POA: Diagnosis not present

## 2016-03-20 DIAGNOSIS — M21371 Foot drop, right foot: Secondary | ICD-10-CM | POA: Diagnosis not present

## 2016-03-20 DIAGNOSIS — M150 Primary generalized (osteo)arthritis: Secondary | ICD-10-CM | POA: Diagnosis not present

## 2016-03-20 DIAGNOSIS — Z9181 History of falling: Secondary | ICD-10-CM | POA: Diagnosis not present

## 2016-03-20 DIAGNOSIS — I69398 Other sequelae of cerebral infarction: Secondary | ICD-10-CM | POA: Diagnosis not present

## 2016-03-22 ENCOUNTER — Inpatient Hospital Stay: Payer: Self-pay | Admitting: Family

## 2016-03-23 DIAGNOSIS — M545 Low back pain: Secondary | ICD-10-CM | POA: Diagnosis not present

## 2016-03-23 DIAGNOSIS — Z9181 History of falling: Secondary | ICD-10-CM | POA: Diagnosis not present

## 2016-03-23 DIAGNOSIS — Z87891 Personal history of nicotine dependence: Secondary | ICD-10-CM | POA: Diagnosis not present

## 2016-03-23 DIAGNOSIS — M21371 Foot drop, right foot: Secondary | ICD-10-CM | POA: Diagnosis not present

## 2016-03-23 DIAGNOSIS — R2689 Other abnormalities of gait and mobility: Secondary | ICD-10-CM | POA: Diagnosis not present

## 2016-03-23 DIAGNOSIS — Z7984 Long term (current) use of oral hypoglycemic drugs: Secondary | ICD-10-CM | POA: Diagnosis not present

## 2016-03-23 DIAGNOSIS — Z7982 Long term (current) use of aspirin: Secondary | ICD-10-CM | POA: Diagnosis not present

## 2016-03-23 DIAGNOSIS — M150 Primary generalized (osteo)arthritis: Secondary | ICD-10-CM | POA: Diagnosis not present

## 2016-03-23 DIAGNOSIS — E1142 Type 2 diabetes mellitus with diabetic polyneuropathy: Secondary | ICD-10-CM | POA: Diagnosis not present

## 2016-03-23 DIAGNOSIS — I69398 Other sequelae of cerebral infarction: Secondary | ICD-10-CM | POA: Diagnosis not present

## 2016-03-24 ENCOUNTER — Ambulatory Visit (INDEPENDENT_AMBULATORY_CARE_PROVIDER_SITE_OTHER): Payer: Medicare Other | Admitting: Family

## 2016-03-24 ENCOUNTER — Encounter: Payer: Self-pay | Admitting: Family

## 2016-03-24 VITALS — BP 116/74 | HR 83 | Temp 98.2°F | Ht 62.0 in | Wt 163.0 lb

## 2016-03-24 DIAGNOSIS — R2689 Other abnormalities of gait and mobility: Secondary | ICD-10-CM | POA: Diagnosis not present

## 2016-03-24 DIAGNOSIS — Z7982 Long term (current) use of aspirin: Secondary | ICD-10-CM | POA: Diagnosis not present

## 2016-03-24 DIAGNOSIS — I69398 Other sequelae of cerebral infarction: Secondary | ICD-10-CM | POA: Diagnosis not present

## 2016-03-24 DIAGNOSIS — I6322 Cerebral infarction due to unspecified occlusion or stenosis of basilar arteries: Secondary | ICD-10-CM | POA: Diagnosis not present

## 2016-03-24 DIAGNOSIS — E1142 Type 2 diabetes mellitus with diabetic polyneuropathy: Secondary | ICD-10-CM | POA: Diagnosis not present

## 2016-03-24 DIAGNOSIS — Z7984 Long term (current) use of oral hypoglycemic drugs: Secondary | ICD-10-CM | POA: Diagnosis not present

## 2016-03-24 DIAGNOSIS — M150 Primary generalized (osteo)arthritis: Secondary | ICD-10-CM | POA: Diagnosis not present

## 2016-03-24 DIAGNOSIS — I6381 Other cerebral infarction due to occlusion or stenosis of small artery: Secondary | ICD-10-CM

## 2016-03-24 DIAGNOSIS — I63219 Cerebral infarction due to unspecified occlusion or stenosis of unspecified vertebral arteries: Secondary | ICD-10-CM | POA: Diagnosis not present

## 2016-03-24 DIAGNOSIS — M545 Low back pain: Secondary | ICD-10-CM | POA: Diagnosis not present

## 2016-03-24 DIAGNOSIS — Z9181 History of falling: Secondary | ICD-10-CM | POA: Diagnosis not present

## 2016-03-24 DIAGNOSIS — M21371 Foot drop, right foot: Secondary | ICD-10-CM | POA: Diagnosis not present

## 2016-03-24 DIAGNOSIS — Z87891 Personal history of nicotine dependence: Secondary | ICD-10-CM | POA: Diagnosis not present

## 2016-03-24 NOTE — Patient Instructions (Signed)
Thank you for choosing [facility].  Summary/Instructions:  Please continue to take your medications as prescribed.  Follow up in 3 months.   If your symptoms worsen or fail to improve, please contact our office for further instruction, or in case of emergency go directly to the emergency room at the closest medical facility.

## 2016-03-24 NOTE — Progress Notes (Signed)
Pre visit review using our clinic review tool, if applicable. No additional management support is needed unless otherwise documented below in the visit note. 

## 2016-03-24 NOTE — Assessment & Plan Note (Addendum)
Continues to experience some difficulties with communication with questionable aphasia versus memory difficulties. Stroke risk factors are adequately controlled at present time with A1c of 6.2 and LDL of 90. Anticoagulated with Plavix. Continue medications as prescribed and follow-up in 3 months. Continue work with physical, occupational, and speech therapy. Has been moved to assisted living facility to help manage medications and activities of daily living as needed. Follow-up with neurology as instructed per hospitalization.

## 2016-03-24 NOTE — Progress Notes (Signed)
Subjective:    Patient ID: Sheila [name], female    DOB: 11/27/1951, 65 y.o.   MRN: [id number]  Chief Complaint  Patient presents with  . Follow-up    Post rehab discharge    HPI:  Sheila [name] is a 65 y.o. female who  has a past medical history of Arthritis; Diabetes mellitus; Stroke ([place]) (2015); and Hyperlipidemia. and presents today For a hospitalization follow-up.  Recently evaluated in the emergency department and admitted to the hospital for confusion and disorientation. Physical therapy had noted that she seemed somewhat confused during her most recent session and she was unable to identify the date however indicates she felt fine otherwise. Physical exam with tachycardic heart rate and neurological exam of orientation to name and hospital and disoriented to time. MRI of the brain was performed which showed a 15 mm acute nonhemorrhagic ischemic infarct within the left ventricle medial thalamus. The puntate foci of her strict a diffusion within the right thalamus and posterior limb of the right internal capsule also suspicious for subtle ischemic changes. There are also generalized cerebral atrophy. MRA was normal. 2-D echocardiogram with normal ejection fraction and grade 1 diastolic dysfunction. Carotid Dopplers with minimal occlusion noted. Her aspirin was discontinued and changed to Plavix. She was still continued to note mental status fluctuations. She was discharged to skilled nursing facility for rehabilitation and discharged to an assisted living facility with home health physical therapy, occupational therapy and speech therapy. She was advised to follow up with neurology in 1 month and primary care upon discharge. All hospital notes, records, labs and images were reviewed in detail.  Since leaving the rehabilitation facility she reports that she is feeling fine. Her son indicates that her memory and communication wax and wane. Has been moved out of her apartment and is  currently a resident in [facility] which is an assisted living facility. Reports she is able to complete her activities of daily living and is ambulating without difficulty with her rollator. Her medications are managed through the assisted living facility. Continues to work with physical therapy, occupational therapy, and speech pathology. Reports taking her medications as prescribed and denies adverse side effects. Previous blood work from the hospital reviewed with A1c of 6.2 and LDL of 90.   Allergies  Allergen Reactions  . Sulfa Antibiotics   . Paxil [Paroxetine Hcl] Other (See Comments)    Dreams/ nightmares     Current Outpatient Prescriptions on File Prior to Visit  Medication Sig Dispense Refill  . ACCU-CHEK FASTCLIX LANCETS MISC Use to help check blood sugars twice a day Dx E11.9 102 each 3  . atorvastatin (LIPITOR) 40 MG tablet Take 1 tablet (40 mg total) by mouth daily at 6 PM.    . Blood Glucose Monitoring Suppl (ACCU-CHEK AVIVA PLUS) W/DEVICE KIT Use as directed once daily 1 kit 1  . clopidogrel (PLAVIX) 75 MG tablet Take 1 tablet (75 mg total) by mouth daily. 30 tablet   . cyclobenzaprine (FLEXERIL) 10 MG tablet TAKE ONE TABLET BY MOUTH  THREE TIMES DAILY AS NEEDED FOR MUSCLE SPASM 90 tablet 0  . fish oil-omega-3 fatty acids 1000 MG capsule Take 1 g by mouth daily.    Marland Kitchen glipiZIDE (GLUCOTROL) 10 MG tablet Take 1 tablet by mouth  daily before breakfast 90 tablet 1  . glucose blood (ACCU-CHEK AVIVA PLUS) test strip 1 each by Other route 2 (two) times daily. Use to check blood sugars twice a day Dx  E11.9 100 each 3  . Lancets Misc. (UNISTIK 2 COMFORT) MISC Use 1 device to check blood sugar 1-2 times daily. Use new device for each test. 200 each 0  . LORazepam (ATIVAN) 0.5 MG tablet TAKE ONE TABLET BY MOUTH TWICE DAILY AS NEEDED FOR ANXIETY 30 tablet 0  . magnesium 30 MG tablet Take 30 mg by mouth daily.    . meloxicam (MOBIC) 15 MG tablet Take 1 tablet by mouth  daily 90  tablet 0  . metFORMIN (GLUCOPHAGE) 500 MG tablet Take 2 tablets by mouth  daily 180 tablet 0  . Multiple Vitamin (MULTIVITAMIN) tablet Take 1 tablet by mouth daily.    . NONFORMULARY OR COMPOUNDED ITEM AFO for R leg for dropped foot Dx history of cva 1 each 0  . sertraline (ZOLOFT) 100 MG tablet Take 1 tablet by mouth  daily 90 tablet 1  . gabapentin (NEURONTIN) 300 MG capsule Take 1 capsule by mouth at  bedtime (Patient not taking: Reported on 03/24/2016) 30 capsule 0   No current facility-administered medications on file prior to visit.     Past Surgical History  Procedure Laterality Date  . Cholecystectomy    . Tonsillectomy      Past Medical History  Diagnosis Date  . Arthritis   . Diabetes mellitus     complicated by peripheral neuropathy  . Stroke [facility]) 2015    residual right foot drop  . Hyperlipidemia     Review of Systems  Constitutional: Negative for fever and chills.  Respiratory: Negative for cough, chest tightness, shortness of breath and wheezing.   Cardiovascular: Negative for chest pain, palpitations and leg swelling.  Neurological: Positive for speech difficulty. Negative for dizziness, facial asymmetry, weakness, numbness and headaches.      Objective:    BP 116/74 mmHg  Pulse 83  Temp(Src) 98.2 F (36.8 C) (Oral)  Ht 5' 2" (1.575 m)  Wt 163 lb (73.936 kg)  BMI 29.81 kg/m2  SpO2 98% Nursing note and vital signs reviewed.  Physical Exam  Constitutional: She is oriented to person, place, and time. She appears well-developed and well-nourished. No distress.  Does have some difficulty with speech.   Cardiovascular: Normal rate, regular rhythm, normal heart sounds and intact distal pulses.   Pulmonary/Chest: Effort normal and breath sounds normal.  Musculoskeletal:  Upper and lower extremity exam with good strength and sensation.   Neurological: She is alert and oriented to person, place, and time.  Skin: Skin is warm and dry.  Psychiatric: She has a  normal mood and affect. Her behavior is normal. Judgment and thought content normal.  Possible aphasic speech       Assessment & Plan:   Problem List Items Addressed This Visit      Cardiovascular and Mediastinum   Acute ischemic VBA thalamic stroke ([place]) - Primary    Continues to experience some difficulties with communication with questionable aphasia versus memory difficulties. Stroke risk factors are adequately controlled at present time with A1c of 6.2 and LDL of 90. Anticoagulated with Plavix. Continue medications as prescribed and follow-up in 3 months. Continue work with physical, occupational, and speech therapy. Has been moved to assisted living facility to help manage medications and activities of daily living as needed. Follow-up with neurology as instructed per hospitalization.         I am having Ms. Viscuso maintain her fish oil-omega-3 fatty acids, magnesium, multivitamin, NONFORMULARY OR COMPOUNDED ITEM, glipiZIDE, UNISTIK 2 COMFORT, ACCU-CHEK AVIVA PLUS, glucose blood,  ACCU-CHEK FASTCLIX LANCETS, gabapentin, cyclobenzaprine, meloxicam, metFORMIN, sertraline, clopidogrel, atorvastatin, and LORazepam.

## 2016-03-25 DIAGNOSIS — Z9181 History of falling: Secondary | ICD-10-CM | POA: Diagnosis not present

## 2016-03-25 DIAGNOSIS — M150 Primary generalized (osteo)arthritis: Secondary | ICD-10-CM | POA: Diagnosis not present

## 2016-03-25 DIAGNOSIS — Z87891 Personal history of nicotine dependence: Secondary | ICD-10-CM | POA: Diagnosis not present

## 2016-03-25 DIAGNOSIS — R2689 Other abnormalities of gait and mobility: Secondary | ICD-10-CM | POA: Diagnosis not present

## 2016-03-25 DIAGNOSIS — M545 Low back pain: Secondary | ICD-10-CM | POA: Diagnosis not present

## 2016-03-25 DIAGNOSIS — I69398 Other sequelae of cerebral infarction: Secondary | ICD-10-CM | POA: Diagnosis not present

## 2016-03-25 DIAGNOSIS — Z7984 Long term (current) use of oral hypoglycemic drugs: Secondary | ICD-10-CM | POA: Diagnosis not present

## 2016-03-25 DIAGNOSIS — M21371 Foot drop, right foot: Secondary | ICD-10-CM | POA: Diagnosis not present

## 2016-03-25 DIAGNOSIS — E1142 Type 2 diabetes mellitus with diabetic polyneuropathy: Secondary | ICD-10-CM | POA: Diagnosis not present

## 2016-03-25 DIAGNOSIS — Z7982 Long term (current) use of aspirin: Secondary | ICD-10-CM | POA: Diagnosis not present

## 2016-03-26 DIAGNOSIS — Z7982 Long term (current) use of aspirin: Secondary | ICD-10-CM | POA: Diagnosis not present

## 2016-03-26 DIAGNOSIS — M21371 Foot drop, right foot: Secondary | ICD-10-CM | POA: Diagnosis not present

## 2016-03-26 DIAGNOSIS — Z7984 Long term (current) use of oral hypoglycemic drugs: Secondary | ICD-10-CM | POA: Diagnosis not present

## 2016-03-26 DIAGNOSIS — M150 Primary generalized (osteo)arthritis: Secondary | ICD-10-CM | POA: Diagnosis not present

## 2016-03-26 DIAGNOSIS — R2689 Other abnormalities of gait and mobility: Secondary | ICD-10-CM | POA: Diagnosis not present

## 2016-03-26 DIAGNOSIS — M545 Low back pain: Secondary | ICD-10-CM | POA: Diagnosis not present

## 2016-03-26 DIAGNOSIS — I69398 Other sequelae of cerebral infarction: Secondary | ICD-10-CM | POA: Diagnosis not present

## 2016-03-26 DIAGNOSIS — E1142 Type 2 diabetes mellitus with diabetic polyneuropathy: Secondary | ICD-10-CM | POA: Diagnosis not present

## 2016-03-26 DIAGNOSIS — Z9181 History of falling: Secondary | ICD-10-CM | POA: Diagnosis not present

## 2016-03-26 DIAGNOSIS — Z87891 Personal history of nicotine dependence: Secondary | ICD-10-CM | POA: Diagnosis not present

## 2016-03-30 DIAGNOSIS — Z7984 Long term (current) use of oral hypoglycemic drugs: Secondary | ICD-10-CM | POA: Diagnosis not present

## 2016-03-30 DIAGNOSIS — M21371 Foot drop, right foot: Secondary | ICD-10-CM | POA: Diagnosis not present

## 2016-03-30 DIAGNOSIS — Z7982 Long term (current) use of aspirin: Secondary | ICD-10-CM | POA: Diagnosis not present

## 2016-03-30 DIAGNOSIS — Z9181 History of falling: Secondary | ICD-10-CM | POA: Diagnosis not present

## 2016-03-30 DIAGNOSIS — Z87891 Personal history of nicotine dependence: Secondary | ICD-10-CM | POA: Diagnosis not present

## 2016-03-30 DIAGNOSIS — M545 Low back pain: Secondary | ICD-10-CM | POA: Diagnosis not present

## 2016-03-30 DIAGNOSIS — E1142 Type 2 diabetes mellitus with diabetic polyneuropathy: Secondary | ICD-10-CM | POA: Diagnosis not present

## 2016-03-30 DIAGNOSIS — M150 Primary generalized (osteo)arthritis: Secondary | ICD-10-CM | POA: Diagnosis not present

## 2016-03-30 DIAGNOSIS — I69398 Other sequelae of cerebral infarction: Secondary | ICD-10-CM | POA: Diagnosis not present

## 2016-03-30 DIAGNOSIS — R2689 Other abnormalities of gait and mobility: Secondary | ICD-10-CM | POA: Diagnosis not present

## 2016-03-30 NOTE — Progress Notes (Signed)
This encounter was created in error - please disregard.

## 2016-04-01 DIAGNOSIS — I69398 Other sequelae of cerebral infarction: Secondary | ICD-10-CM | POA: Diagnosis not present

## 2016-04-01 DIAGNOSIS — Z87891 Personal history of nicotine dependence: Secondary | ICD-10-CM | POA: Diagnosis not present

## 2016-04-01 DIAGNOSIS — Z9181 History of falling: Secondary | ICD-10-CM | POA: Diagnosis not present

## 2016-04-01 DIAGNOSIS — R2689 Other abnormalities of gait and mobility: Secondary | ICD-10-CM | POA: Diagnosis not present

## 2016-04-01 DIAGNOSIS — E1142 Type 2 diabetes mellitus with diabetic polyneuropathy: Secondary | ICD-10-CM | POA: Diagnosis not present

## 2016-04-01 DIAGNOSIS — Z7982 Long term (current) use of aspirin: Secondary | ICD-10-CM | POA: Diagnosis not present

## 2016-04-01 DIAGNOSIS — M21371 Foot drop, right foot: Secondary | ICD-10-CM | POA: Diagnosis not present

## 2016-04-01 DIAGNOSIS — M545 Low back pain: Secondary | ICD-10-CM | POA: Diagnosis not present

## 2016-04-01 DIAGNOSIS — M150 Primary generalized (osteo)arthritis: Secondary | ICD-10-CM | POA: Diagnosis not present

## 2016-04-01 DIAGNOSIS — Z7984 Long term (current) use of oral hypoglycemic drugs: Secondary | ICD-10-CM | POA: Diagnosis not present

## 2016-04-02 DIAGNOSIS — R2689 Other abnormalities of gait and mobility: Secondary | ICD-10-CM | POA: Diagnosis not present

## 2016-04-02 DIAGNOSIS — Z9181 History of falling: Secondary | ICD-10-CM | POA: Diagnosis not present

## 2016-04-02 DIAGNOSIS — I69398 Other sequelae of cerebral infarction: Secondary | ICD-10-CM | POA: Diagnosis not present

## 2016-04-02 DIAGNOSIS — Z7982 Long term (current) use of aspirin: Secondary | ICD-10-CM | POA: Diagnosis not present

## 2016-04-02 DIAGNOSIS — Z7984 Long term (current) use of oral hypoglycemic drugs: Secondary | ICD-10-CM | POA: Diagnosis not present

## 2016-04-02 DIAGNOSIS — M21371 Foot drop, right foot: Secondary | ICD-10-CM | POA: Diagnosis not present

## 2016-04-02 DIAGNOSIS — M545 Low back pain: Secondary | ICD-10-CM | POA: Diagnosis not present

## 2016-04-02 DIAGNOSIS — M150 Primary generalized (osteo)arthritis: Secondary | ICD-10-CM | POA: Diagnosis not present

## 2016-04-02 DIAGNOSIS — Z87891 Personal history of nicotine dependence: Secondary | ICD-10-CM | POA: Diagnosis not present

## 2016-04-02 DIAGNOSIS — E1142 Type 2 diabetes mellitus with diabetic polyneuropathy: Secondary | ICD-10-CM | POA: Diagnosis not present

## 2016-04-08 DIAGNOSIS — Z7984 Long term (current) use of oral hypoglycemic drugs: Secondary | ICD-10-CM | POA: Diagnosis not present

## 2016-04-08 DIAGNOSIS — I69398 Other sequelae of cerebral infarction: Secondary | ICD-10-CM | POA: Diagnosis not present

## 2016-04-08 DIAGNOSIS — M150 Primary generalized (osteo)arthritis: Secondary | ICD-10-CM | POA: Diagnosis not present

## 2016-04-08 DIAGNOSIS — M545 Low back pain: Secondary | ICD-10-CM | POA: Diagnosis not present

## 2016-04-08 DIAGNOSIS — M21371 Foot drop, right foot: Secondary | ICD-10-CM | POA: Diagnosis not present

## 2016-04-08 DIAGNOSIS — E1142 Type 2 diabetes mellitus with diabetic polyneuropathy: Secondary | ICD-10-CM | POA: Diagnosis not present

## 2016-04-08 DIAGNOSIS — Z7982 Long term (current) use of aspirin: Secondary | ICD-10-CM | POA: Diagnosis not present

## 2016-04-08 DIAGNOSIS — R2689 Other abnormalities of gait and mobility: Secondary | ICD-10-CM | POA: Diagnosis not present

## 2016-04-08 DIAGNOSIS — Z9181 History of falling: Secondary | ICD-10-CM | POA: Diagnosis not present

## 2016-04-08 DIAGNOSIS — Z87891 Personal history of nicotine dependence: Secondary | ICD-10-CM | POA: Diagnosis not present

## 2016-04-20 ENCOUNTER — Telehealth: Payer: Self-pay | Admitting: *Deleted

## 2016-04-20 NOTE — Telephone Encounter (Signed)
Received call [place] states they have the FL2 form that was completed needing to verify msg on some medications. Wanting to get a copy of med list fax to her @ [phone], also she states the pt need a script for Tramadol to be fax to [facility] care pharmacy. Faxed med list pls advsie on refill...Johny Chess

## 2016-04-21 MED ORDER — TRAMADOL HCL 50 MG PO TABS: 50.0000 mg | ORAL_TABLET | Freq: Three times a day (TID) | ORAL | Status: DC | PRN

## 2016-04-21 NOTE — Telephone Encounter (Signed)
Faxed script to [facility] care...Johny Chess

## 2016-04-21 NOTE — Telephone Encounter (Signed)
Pls advise on msg below.../lmb 

## 2016-04-21 NOTE — Telephone Encounter (Signed)
Medication printed

## 2016-04-28 ENCOUNTER — Ambulatory Visit: Payer: Medicare Other | Admitting: Neurology

## 2016-04-28 DIAGNOSIS — Z029 Encounter for administrative examinations, unspecified: Secondary | ICD-10-CM

## 2016-04-29 ENCOUNTER — Ambulatory Visit: Payer: Medicare Other | Admitting: Neurology

## 2016-05-18 ENCOUNTER — Telehealth: Payer: Self-pay

## 2016-05-18 NOTE — Telephone Encounter (Signed)
Nursing home called and said the patient has been acting up and out of her normal.. Could they get an order for a UA. Thank you.  Also. Pharmacy had faxed something over could you fax it back as well when you fax over the UA orders.    [id number]

## 2016-05-19 ENCOUNTER — Other Ambulatory Visit: Payer: Self-pay | Admitting: Family

## 2016-05-19 NOTE — Telephone Encounter (Signed)
Faxed over rx for UA and culture.

## 2016-05-19 NOTE — Telephone Encounter (Signed)
Called back to see exactly how they wanted the UA order done. Waiting on nursing home to return call back. Have not seen a fax from pts pharmacy.

## 2016-08-27 ENCOUNTER — Telehealth: Payer: Self-pay | Admitting: Emergency Medicine

## 2016-08-27 NOTE — Telephone Encounter (Signed)
Pam needs you to give her a call back. They have sent several faxes and havent heard anything. Her blood sugars are running high and she wants to know if she needs to make the patient an appt to be seen. She is unsure what they are suppose to be running. Please advise thanks.

## 2016-08-27 NOTE — Telephone Encounter (Signed)
Called back and advised that pt needs to be seen within the office to address any diabetic related problems due to not being seen in over 6 months. They advised that I call son to get him to bring her in for an appointment. I tried calling son. No answer and no VM. Will try back later

## 2016-09-08 ENCOUNTER — Emergency Department (HOSPITAL_COMMUNITY)
Admission: EM | Admit: 2016-09-08 | Discharge: 2016-09-08 | Disposition: A | Discharge status: Home / Self Care | Payer: Medicare Other | Attending: Emergency Medicine | Admitting: Emergency Medicine

## 2016-09-08 ENCOUNTER — Encounter (HOSPITAL_COMMUNITY): Payer: Self-pay | Admitting: Emergency Medicine

## 2016-09-08 ENCOUNTER — Emergency Department (HOSPITAL_COMMUNITY): Payer: Medicare Other

## 2016-09-08 ENCOUNTER — Ambulatory Visit: Payer: Medicare Other | Admitting: Family

## 2016-09-08 DIAGNOSIS — S80211A Abrasion, right knee, initial encounter: Secondary | ICD-10-CM | POA: Insufficient documentation

## 2016-09-08 DIAGNOSIS — E1165 Type 2 diabetes mellitus with hyperglycemia: Secondary | ICD-10-CM | POA: Insufficient documentation

## 2016-09-08 DIAGNOSIS — Z87891 Personal history of nicotine dependence: Secondary | ICD-10-CM | POA: Insufficient documentation

## 2016-09-08 DIAGNOSIS — E1140 Type 2 diabetes mellitus with diabetic neuropathy, unspecified: Secondary | ICD-10-CM | POA: Diagnosis not present

## 2016-09-08 DIAGNOSIS — Y939 Activity, unspecified: Secondary | ICD-10-CM | POA: Diagnosis not present

## 2016-09-08 DIAGNOSIS — S80212A Abrasion, left knee, initial encounter: Secondary | ICD-10-CM | POA: Diagnosis not present

## 2016-09-08 DIAGNOSIS — Z8673 Personal history of transient ischemic attack (TIA), and cerebral infarction without residual deficits: Secondary | ICD-10-CM | POA: Insufficient documentation

## 2016-09-08 DIAGNOSIS — F0390 Unspecified dementia without behavioral disturbance: Secondary | ICD-10-CM | POA: Insufficient documentation

## 2016-09-08 DIAGNOSIS — S8992XA Unspecified injury of left lower leg, initial encounter: Secondary | ICD-10-CM | POA: Diagnosis present

## 2016-09-08 DIAGNOSIS — Z7984 Long term (current) use of oral hypoglycemic drugs: Secondary | ICD-10-CM | POA: Diagnosis not present

## 2016-09-08 DIAGNOSIS — M545 Low back pain, unspecified: Secondary | ICD-10-CM

## 2016-09-08 DIAGNOSIS — S0990XA Unspecified injury of head, initial encounter: Secondary | ICD-10-CM | POA: Diagnosis not present

## 2016-09-08 DIAGNOSIS — M25462 Effusion, left knee: Secondary | ICD-10-CM | POA: Diagnosis not present

## 2016-09-08 DIAGNOSIS — S199XXA Unspecified injury of neck, initial encounter: Secondary | ICD-10-CM | POA: Diagnosis not present

## 2016-09-08 DIAGNOSIS — W06XXXA Fall from bed, initial encounter: Secondary | ICD-10-CM | POA: Diagnosis not present

## 2016-09-08 DIAGNOSIS — T148 Other injury of unspecified body region: Secondary | ICD-10-CM | POA: Diagnosis not present

## 2016-09-08 DIAGNOSIS — I10 Essential (primary) hypertension: Secondary | ICD-10-CM | POA: Insufficient documentation

## 2016-09-08 DIAGNOSIS — Y999 Unspecified external cause status: Secondary | ICD-10-CM | POA: Diagnosis not present

## 2016-09-08 DIAGNOSIS — Z79899 Other long term (current) drug therapy: Secondary | ICD-10-CM | POA: Insufficient documentation

## 2016-09-08 DIAGNOSIS — Y929 Unspecified place or not applicable: Secondary | ICD-10-CM | POA: Insufficient documentation

## 2016-09-08 DIAGNOSIS — S3992XA Unspecified injury of lower back, initial encounter: Secondary | ICD-10-CM | POA: Diagnosis not present

## 2016-09-08 DIAGNOSIS — R739 Hyperglycemia, unspecified: Secondary | ICD-10-CM

## 2016-09-08 DIAGNOSIS — M5489 Other dorsalgia: Secondary | ICD-10-CM | POA: Diagnosis not present

## 2016-09-08 DIAGNOSIS — S299XXA Unspecified injury of thorax, initial encounter: Secondary | ICD-10-CM | POA: Diagnosis not present

## 2016-09-08 DIAGNOSIS — W19XXXA Unspecified fall, initial encounter: Secondary | ICD-10-CM

## 2016-09-08 LAB — I-STAT VENOUS BLOOD GAS, ED
ACID-BASE EXCESS: 4 mmol/L — AB (ref 0.0–2.0)
Bicarbonate: 29.6 mmol/L — ABNORMAL HIGH (ref 20.0–28.0)
O2 SAT: 84 %
TCO2: 31 mmol/L (ref 0–100)
pCO2, Ven: 45.5 mmHg (ref 44.0–60.0)
pH, Ven: 7.421 (ref 7.250–7.430)
pO2, Ven: 48 mmHg — ABNORMAL HIGH (ref 32.0–45.0)

## 2016-09-08 LAB — CBC WITH DIFFERENTIAL/PLATELET
BASOS PCT: 0 %
Basophils Absolute: 0 10*3/uL (ref 0.0–0.1)
Eosinophils Absolute: 0 10*3/uL (ref 0.0–0.7)
Eosinophils Relative: 0 %
HEMATOCRIT: 38.2 % (ref 36.0–46.0)
Hemoglobin: 13.3 g/dL (ref 12.0–15.0)
Lymphocytes Relative: 16 %
Lymphs Abs: 1.5 10*3/uL (ref 0.7–4.0)
MCH: 30.6 pg (ref 26.0–34.0)
MCHC: 34.8 g/dL (ref 30.0–36.0)
MCV: 88 fL (ref 78.0–100.0)
MONO ABS: 0.7 10*3/uL (ref 0.1–1.0)
MONOS PCT: 7 %
NEUTROS ABS: 7.5 10*3/uL (ref 1.7–7.7)
Neutrophils Relative %: 77 %
Platelets: 234 10*3/uL (ref 150–400)
RBC: 4.34 MIL/uL (ref 3.87–5.11)
RDW: 12.7 % (ref 11.5–15.5)
WBC: 9.8 10*3/uL (ref 4.0–10.5)

## 2016-09-08 LAB — CBG MONITORING, ED: Glucose-Capillary: 376 mg/dL — ABNORMAL HIGH (ref 65–99)

## 2016-09-08 LAB — URINALYSIS, ROUTINE W REFLEX MICROSCOPIC
BILIRUBIN URINE: NEGATIVE
HGB URINE DIPSTICK: NEGATIVE
KETONES UR: 15 mg/dL — AB
Leukocytes, UA: NEGATIVE
NITRITE: NEGATIVE
PH: 7 (ref 5.0–8.0)
Protein, ur: NEGATIVE mg/dL
Specific Gravity, Urine: 1.035 — ABNORMAL HIGH (ref 1.005–1.030)

## 2016-09-08 LAB — BASIC METABOLIC PANEL
Anion gap: 9 (ref 5–15)
BUN: 20 mg/dL (ref 6–20)
CALCIUM: 9.8 mg/dL (ref 8.9–10.3)
CO2: 25 mmol/L (ref 22–32)
CREATININE: 1.21 mg/dL — AB (ref 0.44–1.00)
Chloride: 102 mmol/L (ref 101–111)
GFR calc non Af Amer: 46 mL/min — ABNORMAL LOW (ref >60)
GFR, EST AFRICAN AMERICAN: 53 mL/min — AB (ref >60)
GLUCOSE: 362 mg/dL — AB (ref 65–99)
Potassium: 4.4 mmol/L (ref 3.5–5.1)
Sodium: 136 mmol/L (ref 135–145)

## 2016-09-08 LAB — I-STAT CG4 LACTIC ACID, ED: LACTIC ACID, VENOUS: 2.06 mmol/L — AB (ref 0.5–1.9)

## 2016-09-08 LAB — URINE MICROSCOPIC-ADD ON: RBC / HPF: NONE SEEN RBC/hpf (ref 0–5)

## 2016-09-08 LAB — CK: Total CK: 233 U/L (ref 38–234)

## 2016-09-08 MED ORDER — SODIUM CHLORIDE 0.9 % IV BOLUS (SEPSIS)
1000.0000 mL | Freq: Once | INTRAVENOUS | Status: AC
  Administered 2016-09-08: 1000 mL via INTRAVENOUS

## 2016-09-08 NOTE — ED Notes (Signed)
Wheel chair to bedside

## 2016-09-08 NOTE — ED Provider Notes (Signed)
[facility] DEPT Provider Note   CSN: [id number] Arrival date & time: 09/08/16  0759     History   Chief Complaint Chief Complaint  Patient presents with  . Fall  . Hyperglycemia    HPI Sheila [name] is a 65 y.o. female.  65 year old female with past medical history including CVA with right foot drop, type 2 diabetes mellitus, hypertension who presents after a mechanical fall. Staff at her assisted living facility reports that the patient had an unwitnessed fall this morning and laid on the floor for approximately 30 minutes prior to being found by staff. The patient states that she was trying to get out of bed and fell. She is unsure whether she fell forward or backward. She does not think she lost consciousness. Staff reports that she does have some memory problems but currently she is at her neurologic baseline. The patient complained of low back pain for EMS but currently denies any pain. Specifically, she denies any chest, abdominal, or back pain. No complaints. EMS notes that her blood glucose was 552 and staff reports that this has been an ongoing issue for the patient. She has an appointment with her PCP today to address the hyperglycemia. Patient denies any nausea or urinary symptoms. She does not think that she has had any cough or respiratory symptoms recently.  LEVEL 5 CAVEAT DUE TO MEMORY PROBLEMS   The history is provided by the patient and the EMS personnel.  Fall   Hyperglycemia    Past Medical History:  Diagnosis Date  . Arthritis   . Diabetes mellitus    complicated by peripheral neuropathy  . Hyperlipidemia   . Stroke [facility]) 2015   residual right foot drop    Patient Active Problem List   Diagnosis Date Noted  . UTI (urinary tract infection) 02/28/2016  . Depression 02/28/2016  . Acute ischemic VBA thalamic stroke ([place])   . Stroke (cerebrum) ([place])   . Anomia 02/17/2016  . Stroke ([place]) 02/17/2016  . Peripheral arterial disease ([place]) 12/03/2015    . Neuropathy ([place]) 09/05/2015  . Medicare annual wellness visit, subsequent 03/12/2015  . Routine general medical examination at a health care facility 10/08/2014  . Cerumen impaction 10/08/2014  . Low back pain potentially associated with radiculopathy 03/01/2013  . Hyperlipidemia 02/15/2012  . DM (diabetes mellitus) ([place]) 02/15/2012    Past Surgical History:  Procedure Laterality Date  . CHOLECYSTECTOMY    . TONSILLECTOMY      OB History    No data available       Home Medications    Prior to Admission medications   Medication Sig Start Date End Date Taking? Authorizing Provider  atorvastatin (LIPITOR) 40 MG tablet Take 1 tablet (40 mg total) by mouth daily at 6 PM. 02/20/16  Yes Costin Karlyne Greenspan, MD  clopidogrel (PLAVIX) 75 MG tablet Take 1 tablet (75 mg total) by mouth daily. 02/20/16  Yes Costin Karlyne Greenspan, MD  fish oil-omega-3 fatty acids 1000 MG capsule Take 1 g by mouth daily.   Yes Historical Provider, MD  gabapentin (NEURONTIN) 300 MG capsule Take 1 capsule by mouth at  bedtime 12/16/15  Yes Golden Circle, FNP  glipiZIDE (GLUCOTROL) 10 MG tablet Take 1 tablet by mouth  daily before breakfast 08/28/15  Yes Golden Circle, FNP  meloxicam (MOBIC) 15 MG tablet Take 1 tablet by mouth  daily 01/05/16  Yes Golden Circle, FNP  metFORMIN (GLUCOPHAGE) 500 MG tablet Take 2 tablets by mouth  daily 01/08/16  Yes Golden Circle, FNP  Multiple Vitamin (MULTIVITAMIN) tablet Take 1 tablet by mouth daily.   Yes Historical Provider, MD  sertraline (ZOLOFT) 100 MG tablet Take 1 tablet by mouth  daily 01/08/16  Yes Golden Circle, FNP  cyclobenzaprine (FLEXERIL) 10 MG tablet TAKE ONE TABLET BY MOUTH  THREE TIMES DAILY AS NEEDED FOR MUSCLE SPASM Patient not taking: Reported on 09/08/2016 12/17/15   Golden Circle, FNP  LORazepam (ATIVAN) 0.5 MG tablet TAKE ONE TABLET BY MOUTH TWICE DAILY AS NEEDED FOR ANXIETY 02/20/16   Caren Griffins, MD  NONFORMULARY OR COMPOUNDED ITEM AFO for R leg  for dropped foot Dx history of cva 06/18/14   Rosalita Chessman Chase, DO  traMADol (ULTRAM) 50 MG tablet Take 1 tablet (50 mg total) by mouth every 8 (eight) hours as needed. Patient not taking: Reported on 09/08/2016 04/21/16   Golden Circle, FNP    Family History Family History  Problem Relation Age of Onset  . Diabetes Mother     Deceased  . Diabetes Brother   . Healthy Son     Social History Social History  Substance Use Topics  . Smoking status: Former Smoker    Types: Cigarettes    Quit date: 02/15/1992  . Smokeless tobacco: Never Used  . Alcohol use No     Comment: Socially     Allergies   Sulfa antibiotics and Paxil [paroxetine hcl]   Review of Systems Review of Systems  Unable to perform ROS: Dementia     Physical Exam Updated Vital Signs BP 129/61   Pulse 89   Temp 98.2 F (36.8 C) (Oral)   Resp 18   Ht 5\' 2"  (1.575 m)   Wt 165 lb (74.8 kg)   SpO2 100%   BMI 30.18 kg/m   Physical Exam  Constitutional: She appears well-developed and well-nourished. No distress.  Awake, alert  HENT:  Head: Normocephalic and atraumatic.  Eyes: Conjunctivae and EOM are normal. Pupils are equal, round, and reactive to light.  Neck: Neck supple.  Cardiovascular: Normal rate, regular rhythm and normal heart sounds.   No murmur heard. Pulmonary/Chest: Effort normal and breath sounds normal. No respiratory distress. She exhibits no tenderness.  Abdominal: Soft. Bowel sounds are normal. She exhibits no distension. There is no tenderness.  Musculoskeletal: She exhibits no edema.  Normal ROM b/l knees w/ mild swelling and abrasion to b/l knees, L knee with mild tenderness during passive ROM No midline spinal tenderness  Neurological: She is alert. She has normal reflexes. No cranial nerve deficit. She exhibits normal muscle tone.  Fluent speech, oriented to person and place, normal finger-to-nose testing, negative pronator drift 5/5 strength BUE, 4/5 strength BLE w/ mild R  foot drop  normal sensation x all 4 extremities  Skin: Skin is warm and dry.  Abrasions b/l knees, dorsal 1st MTP joint  Psychiatric: She has a normal mood and affect.  Nursing note and vitals reviewed.    ED Treatments / Results  Labs (all labs ordered are listed, but only abnormal results are displayed) Labs Reviewed  BASIC METABOLIC PANEL - Abnormal; Notable for the following:       Result Value   Glucose, Bld 362 (*)    Creatinine, Ser 1.21 (*)    GFR calc non Af Amer 46 (*)    GFR calc Af Amer 53 (*)    All other components within normal limits  URINALYSIS, ROUTINE W REFLEX MICROSCOPIC (NOT  AT [facility]) - Abnormal; Notable for the following:    Specific Gravity, Urine 1.035 (*)    Glucose, UA >1000 (*)    Ketones, ur 15 (*)    All other components within normal limits  URINE MICROSCOPIC-ADD ON - Abnormal; Notable for the following:    Squamous Epithelial / LPF 0-5 (*)    Bacteria, UA RARE (*)    All other components within normal limits  I-STAT VENOUS BLOOD GAS, ED - Abnormal; Notable for the following:    pO2, Ven 48.0 (*)    Bicarbonate 29.6 (*)    Acid-Base Excess 4.0 (*)    All other components within normal limits  I-STAT CG4 LACTIC ACID, ED - Abnormal; Notable for the following:    Lactic Acid, Venous 2.06 (*)    All other components within normal limits  CBG MONITORING, ED - Abnormal; Notable for the following:    Glucose-Capillary 376 (*)    All other components within normal limits  URINE CULTURE  CBC WITH DIFFERENTIAL/PLATELET  CK    EKG  EKG Interpretation  Date/Time:  Wednesday September 08 2016 08:10:36 EDT Ventricular Rate:  93 PR Interval:    QRS Duration: 90 QT Interval:  369 QTC Calculation: 459 R Axis:   2 Text Interpretation:  Sinus rhythm since previous tracing, tachycardia has improved Confirmed by Salvador Coupe MD, Talia Hoheisel [phone]) on 09/08/2016 8:38:35 AM       Radiology Dg Chest 2 View  Result Date: 09/08/2016 CLINICAL DATA:  Status post  fall today.  Confusion. EXAM: CHEST  2 VIEW COMPARISON:  PA and lateral chest 02/17/2016. FINDINGS: The lungs are clear. Heart size is normal. No pneumothorax or pleural effusion. Thoracolumbar scoliosis and multilevel degenerative disease are identified. IMPRESSION: No acute disease. Electronically Signed   By: Inge Rise M.D.   On: 09/08/2016 08:53   Dg Lumbar Spine 2-3 Views  Result Date: 09/08/2016 CLINICAL DATA:  65 year old female fell from standing. Left knee pain. Initial encounter. EXAM: LUMBAR SPINE - 2-3 VIEW COMPARISON:  Lumbar MRI 02/08/2015.  Lumbar radiographs 03/01/2013. FINDINGS: Calcified aortic atherosclerosis. Hypoplastic ribs at T12, otherwise normal lumbar segmentation. This is the same numbering system used on the 2016 MRI. Stable lumbar vertebral height and alignment. Visible lower thoracic levels appear intact, dextro convex lower thoracic scoliosis. Degenerative vacuum disc has progressed at L1-L2. Disc space loss and endplate spurring there. Relatively preserved lumbar disc spaces elsewhere. Widespread lower thoracic disc space loss and endplate spurring. Moderate lower lumbar facet hypertrophy. Visible sacrum appears stable and intact. IMPRESSION: 1.  No acute fracture or listhesis identified in the lumbar spine. 2. Progressed and severe L1-L2 disc degeneration since 2014. Moderate lower lumbar facet degeneration. 3.  Calcified aortic atherosclerosis. Electronically Signed   By: Genevie Ann M.D.   On: 09/08/2016 09:00   Ct Head Wo Contrast  Result Date: 09/08/2016 CLINICAL DATA:  Fall from bed today. EXAM: CT HEAD WITHOUT CONTRAST CT CERVICAL SPINE WITHOUT CONTRAST TECHNIQUE: Multidetector CT imaging of the head and cervical spine was performed following the standard protocol without intravenous contrast. Multiplanar CT image reconstructions of the cervical spine were also generated. COMPARISON:  02/17/2016 FINDINGS: CT HEAD FINDINGS Brain: There is atrophy and chronic small  vessel disease changes. No acute intracranial abnormality. Specifically, no hemorrhage, hydrocephalus, mass lesion, acute infarction, or significant intracranial injury. Vascular: No hyperdense vessel or unexpected calcification. Skull: No acute bony abnormality or focal bone lesion. Sinuses/Orbits: Visualized paranasal sinuses and mastoids clear. Orbital soft tissues unremarkable. Other:  None CT CERVICAL SPINE FINDINGS Alignment: Loss of normal cervical lordosis. Slight anterolisthesis of C3 on C4 and C4 on C5. This is likely related to facet disease. Skull base and vertebrae: No acute fracture. No primary bone lesion or focal pathologic process. Soft tissues and spinal canal: No prevertebral fluid or swelling. No visible canal hematoma. Disc levels: Disc space narrowing and spurring at C5-6 compatible with mild degenerative changes. Early degenerative facet disease bilaterally. Upper chest: Negative. Other: None IMPRESSION: No acute intracranial abnormality. Atrophy, chronic small vessel disease. No acute bony abnormality in the cervical spine. Electronically Signed   By: Rolm Baptise M.D.   On: 09/08/2016 09:07   Ct Cervical Spine Wo Contrast  Result Date: 09/08/2016 CLINICAL DATA:  Fall from bed today. EXAM: CT HEAD WITHOUT CONTRAST CT CERVICAL SPINE WITHOUT CONTRAST TECHNIQUE: Multidetector CT imaging of the head and cervical spine was performed following the standard protocol without intravenous contrast. Multiplanar CT image reconstructions of the cervical spine were also generated. COMPARISON:  02/17/2016 FINDINGS: CT HEAD FINDINGS Brain: There is atrophy and chronic small vessel disease changes. No acute intracranial abnormality. Specifically, no hemorrhage, hydrocephalus, mass lesion, acute infarction, or significant intracranial injury. Vascular: No hyperdense vessel or unexpected calcification. Skull: No acute bony abnormality or focal bone lesion. Sinuses/Orbits: Visualized paranasal sinuses and  mastoids clear. Orbital soft tissues unremarkable. Other: None CT CERVICAL SPINE FINDINGS Alignment: Loss of normal cervical lordosis. Slight anterolisthesis of C3 on C4 and C4 on C5. This is likely related to facet disease. Skull base and vertebrae: No acute fracture. No primary bone lesion or focal pathologic process. Soft tissues and spinal canal: No prevertebral fluid or swelling. No visible canal hematoma. Disc levels: Disc space narrowing and spurring at C5-6 compatible with mild degenerative changes. Early degenerative facet disease bilaterally. Upper chest: Negative. Other: None IMPRESSION: No acute intracranial abnormality. Atrophy, chronic small vessel disease. No acute bony abnormality in the cervical spine. Electronically Signed   By: Rolm Baptise M.D.   On: 09/08/2016 09:07   Dg Knee Complete 4 Views Left  Result Date: 09/08/2016 CLINICAL DATA:  65 year old female fell from standing. Left knee pain. Initial encounter. EXAM: LEFT KNEE - COMPLETE 4+ VIEW COMPARISON:  None. FINDINGS: Chondrocalcinosis. Moderate to severe tricompartmental degenerative spurring. Small suprapatellar joint effusion visible on the lateral view. Patella intact. Severe no acute fracture or dislocation identified. Calcified peripheral vascular disease. IMPRESSION: *No acute fracture or dislocation identified about the left knee. *Small joint effusion. *Up to severe tricompartmental degenerative changes, and superimposed Chondrocalcinosis which can be seen in the setting of calcium pyrophosphate deposition disease. Electronically Signed   By: Genevie Ann M.D.   On: 09/08/2016 08:55    Procedures Procedures (including critical care time)  Medications Ordered in ED Medications  sodium chloride 0.9 % bolus 1,000 mL (0 mLs Intravenous Stopped 09/08/16 1039)     Initial Impression / Assessment and Plan / ED Course  I have reviewed the triage vital signs and the nursing notes.  Pertinent labs & imaging results that were  available during my care of the patient were reviewed by me and considered in my medical decision making (see chart for details).  Clinical Course   Pt presents after mechanical fall, initially Complained of back pain to staff but denying pain currently. She was awake, comfortable on exam with reassuring vital signs. Blood glucose 552 by EMS. Obtained above imaging to workup her fall as well as above labwork to workup her hyperglycemia. Gave 1 L  of IV fluids.  Labwork shows hyperglycemia without any evidence of DKA or infection. All imaging was negative for acute injury. Patient was well-appearing on reexamination and continued to deny any complaints. He is contacted the nursing facility and they have been administering her diabetes medications correctly. I have sent a message to the patient's PCP, Dr. Elna Breslow, to follow up with her regarding hyperglycemia. Patient discharged in satisfactory condition.  Final Clinical Impressions(s) / ED Diagnoses   Final diagnoses:  Low back pain  Fall, initial encounter  Hyperglycemia    New Prescriptions Discharge Medication List as of 09/08/2016 12:44 PM       Sharlett Iles, MD 09/08/16 [id number]

## 2016-09-08 NOTE — ED Triage Notes (Signed)
Pt arrives from [facility] assisted [facility] via [facility] reporting fall from bed today.  Pt denies hitting head, denies LOC, back pain.  Abrasions noted to bilat knees and top of R foot. Per EMS staff reports pt at baseline.

## 2016-09-08 NOTE — ED Notes (Signed)
Patient transported to X-ray 

## 2016-09-08 NOTE — ED Notes (Signed)
  CBG 376

## 2016-09-08 NOTE — Discharge Instructions (Signed)
Please follow closely with your primary care provider for reevaluation of your diabetes medications.

## 2016-09-08 NOTE — ED Notes (Signed)
Myself, Amy, NT and Chrislyn, RN cleaned patient, placed in dry gown, changed stretcher linens and readjusted patient on stretcher; patient given warm blankets and resting at this time

## 2016-09-09 ENCOUNTER — Encounter (HOSPITAL_COMMUNITY): Payer: Self-pay | Admitting: Emergency Medicine

## 2016-09-09 ENCOUNTER — Emergency Department (HOSPITAL_COMMUNITY)
Admission: EM | Admit: 2016-09-09 | Discharge: 2016-09-09 | Disposition: A | Discharge status: Home / Self Care | Payer: Medicare Other | Attending: Emergency Medicine | Admitting: Emergency Medicine

## 2016-09-09 DIAGNOSIS — Z8673 Personal history of transient ischemic attack (TIA), and cerebral infarction without residual deficits: Secondary | ICD-10-CM | POA: Insufficient documentation

## 2016-09-09 DIAGNOSIS — Z87891 Personal history of nicotine dependence: Secondary | ICD-10-CM | POA: Diagnosis not present

## 2016-09-09 DIAGNOSIS — E1140 Type 2 diabetes mellitus with diabetic neuropathy, unspecified: Secondary | ICD-10-CM | POA: Insufficient documentation

## 2016-09-09 DIAGNOSIS — Z7984 Long term (current) use of oral hypoglycemic drugs: Secondary | ICD-10-CM | POA: Insufficient documentation

## 2016-09-09 DIAGNOSIS — R404 Transient alteration of awareness: Secondary | ICD-10-CM

## 2016-09-09 DIAGNOSIS — R739 Hyperglycemia, unspecified: Secondary | ICD-10-CM

## 2016-09-09 DIAGNOSIS — M79606 Pain in leg, unspecified: Secondary | ICD-10-CM | POA: Diagnosis not present

## 2016-09-09 DIAGNOSIS — R7309 Other abnormal glucose: Secondary | ICD-10-CM | POA: Diagnosis not present

## 2016-09-09 DIAGNOSIS — E1165 Type 2 diabetes mellitus with hyperglycemia: Secondary | ICD-10-CM | POA: Diagnosis not present

## 2016-09-09 LAB — CBC WITH DIFFERENTIAL/PLATELET
BASOS ABS: 0 10*3/uL (ref 0.0–0.1)
BASOS PCT: 0 %
EOS ABS: 0.1 10*3/uL (ref 0.0–0.7)
EOS PCT: 2 %
HCT: 37.1 % (ref 36.0–46.0)
HEMOGLOBIN: 12.9 g/dL (ref 12.0–15.0)
LYMPHS ABS: 1.7 10*3/uL (ref 0.7–4.0)
Lymphocytes Relative: 21 %
MCH: 30.7 pg (ref 26.0–34.0)
MCHC: 34.8 g/dL (ref 30.0–36.0)
MCV: 88.3 fL (ref 78.0–100.0)
Monocytes Absolute: 0.5 10*3/uL (ref 0.1–1.0)
Monocytes Relative: 6 %
NEUTROS PCT: 71 %
Neutro Abs: 5.9 10*3/uL (ref 1.7–7.7)
PLATELETS: 230 10*3/uL (ref 150–400)
RBC: 4.2 MIL/uL (ref 3.87–5.11)
RDW: 13 % (ref 11.5–15.5)
WBC: 8.2 10*3/uL (ref 4.0–10.5)

## 2016-09-09 LAB — URINALYSIS, ROUTINE W REFLEX MICROSCOPIC
Bilirubin Urine: NEGATIVE
Glucose, UA: >1000 mg/dL — AB
HGB URINE DIPSTICK: NEGATIVE
Ketones, ur: 15 mg/dL — AB
LEUKOCYTES UA: NEGATIVE
NITRITE: NEGATIVE
PROTEIN: NEGATIVE mg/dL
SPECIFIC GRAVITY, URINE: 1.042 — AB (ref 1.005–1.030)
pH: 5.5 (ref 5.0–8.0)

## 2016-09-09 LAB — I-STAT VENOUS BLOOD GAS, ED
Acid-base deficit: 2 mmol/L (ref 0.0–2.0)
Bicarbonate: 22.8 mmol/L (ref 20.0–28.0)
O2 Saturation: 82 %
PCO2 VEN: 38.2 mmHg — AB (ref 44.0–60.0)
PO2 VEN: 47 mmHg — AB (ref 32.0–45.0)
TCO2: 24 mmol/L (ref 0–100)
pH, Ven: 7.383 (ref 7.250–7.430)

## 2016-09-09 LAB — COMPREHENSIVE METABOLIC PANEL
ALBUMIN: 3.7 g/dL (ref 3.5–5.0)
ALK PHOS: 110 U/L (ref 38–126)
ALT: 29 U/L (ref 14–54)
AST: 40 U/L (ref 15–41)
Anion gap: 10 (ref 5–15)
BUN: 15 mg/dL (ref 6–20)
CALCIUM: 9.2 mg/dL (ref 8.9–10.3)
CHLORIDE: 106 mmol/L (ref 101–111)
CO2: 23 mmol/L (ref 22–32)
CREATININE: 1.05 mg/dL — AB (ref 0.44–1.00)
GFR calc non Af Amer: 54 mL/min — ABNORMAL LOW (ref >60)
GLUCOSE: 313 mg/dL — AB (ref 65–99)
Potassium: 4.5 mmol/L (ref 3.5–5.1)
SODIUM: 139 mmol/L (ref 135–145)
Total Bilirubin: 1.1 mg/dL (ref 0.3–1.2)
Total Protein: 6.7 g/dL (ref 6.5–8.1)

## 2016-09-09 LAB — I-STAT CHEM 8, ED
BUN: 17 mg/dL (ref 6–20)
CALCIUM ION: 1.11 mmol/L — AB (ref 1.15–1.40)
CHLORIDE: 103 mmol/L (ref 101–111)
Creatinine, Ser: 0.9 mg/dL (ref 0.44–1.00)
GLUCOSE: 309 mg/dL — AB (ref 65–99)
HCT: 38 % (ref 36.0–46.0)
HEMOGLOBIN: 12.9 g/dL (ref 12.0–15.0)
Potassium: 4.4 mmol/L (ref 3.5–5.1)
SODIUM: 139 mmol/L (ref 135–145)
TCO2: 22 mmol/L (ref 0–100)

## 2016-09-09 LAB — URINE CULTURE: CULTURE: NO GROWTH

## 2016-09-09 LAB — URINE MICROSCOPIC-ADD ON
BACTERIA UA: NONE SEEN
RBC / HPF: NONE SEEN RBC/hpf (ref 0–5)
Squamous Epithelial / LPF: NONE SEEN

## 2016-09-09 LAB — CBG MONITORING, ED: GLUCOSE-CAPILLARY: 318 mg/dL — AB (ref 65–99)

## 2016-09-09 MED ORDER — LACTATED RINGERS IV BOLUS (SEPSIS)
2000.0000 mL | Freq: Once | INTRAVENOUS | Status: AC
  Administered 2016-09-09: 2000 mL via INTRAVENOUS

## 2016-09-09 NOTE — ED Notes (Signed)
Son taking patient back to assisted living

## 2016-09-09 NOTE — ED Notes (Signed)
Pt unable to void at this time. 

## 2016-09-09 NOTE — ED Triage Notes (Signed)
Pt to ER BIB [facility] from [facility] where staff called EMS due to confusion and hyperglycemia x4 days. Pt was seen here last night with same and d/c home. Pt speaking in clear sentences but unable to tell where she's at, what month it is, and why she is here (baseline a/o x4). EMS reports foul smelling urine odor in patient room at facility. No focal neuro deficits noted at present. VSS. CBG per EMS 430. Facility reports patient has been compliant with medications. EMS reported patient to be weak and unable to ambulate to stretcher, which patient is normally ambulatory.

## 2016-09-09 NOTE — ED Provider Notes (Signed)
[facility] DEPT Provider Note   CSN: [id number] Arrival date & time: 09/09/16  [id number]     History   Chief Complaint Chief Complaint  Patient presents with  . Hyperglycemia  . Altered Mental Status    HPI Sheila [name] Sheila [name] is a 65 y.o. female.  65 yo F with a chief complaint of altered mental status and hyperglycemia. This been going on for at least 2 weeks. Patient has been unable to follow-up with her primary care provider. She was seen yesterday for the same here. She had a unremarkable workup. Level V caveat dementia.  On my discussion with the patient she has no current complaints. She is confused about why she is here. She knows that she slept on the floor last night and does not know why. Denies recent falls other than yesterday but she was alert and seen for.    The history is provided by the patient.  Hyperglycemia  Severity:  Mild Onset quality:  Gradual Duration:  2 weeks Timing:  Constant Progression:  Unchanged Chronicity:  New Diabetes status:  Controlled with insulin Relieved by:  Nothing Ineffective treatments:  None tried Associated symptoms: altered mental status   Associated symptoms: no chest pain, no dizziness, no dysuria, no fever, no nausea, no shortness of breath and no vomiting   Altered Mental Status      Past Medical History:  Diagnosis Date  . Arthritis   . Diabetes mellitus    complicated by peripheral neuropathy  . Hyperlipidemia   . Stroke [facility]) 2015   residual right foot drop    Patient Active Problem List   Diagnosis Date Noted  . UTI (urinary tract infection) 02/28/2016  . Depression 02/28/2016  . Acute ischemic VBA thalamic stroke ([place])   . Stroke (cerebrum) ([place])   . Anomia 02/17/2016  . Stroke ([place]) 02/17/2016  . Peripheral arterial disease ([place]) 12/03/2015  . Neuropathy ([place]) 09/05/2015  . Medicare annual wellness visit, subsequent 03/12/2015  . Routine general medical examination at a health care facility 10/08/2014   . Cerumen impaction 10/08/2014  . Low back pain potentially associated with radiculopathy 03/01/2013  . Hyperlipidemia 02/15/2012  . DM (diabetes mellitus) ([place]) 02/15/2012    Past Surgical History:  Procedure Laterality Date  . CHOLECYSTECTOMY    . TONSILLECTOMY      OB History    No data available       Home Medications    Prior to Admission medications   Medication Sig Start Date End Date Taking? Authorizing Provider  atorvastatin (LIPITOR) 40 MG tablet Take 1 tablet (40 mg total) by mouth daily at 6 PM. 02/20/16   Costin Karlyne Greenspan, MD  clopidogrel (PLAVIX) 75 MG tablet Take 1 tablet (75 mg total) by mouth daily. 02/20/16   Costin Karlyne Greenspan, MD  cyclobenzaprine (FLEXERIL) 10 MG tablet TAKE ONE TABLET BY MOUTH  THREE TIMES DAILY AS NEEDED FOR MUSCLE SPASM Patient not taking: Reported on 09/08/2016 12/17/15   Golden Circle, FNP  fish oil-omega-3 fatty acids 1000 MG capsule Take 1 g by mouth daily.    Historical Provider, MD  gabapentin (NEURONTIN) 300 MG capsule Take 1 capsule by mouth at  bedtime 12/16/15   Golden Circle, FNP  glipiZIDE (GLUCOTROL) 10 MG tablet Take 1 tablet by mouth  daily before breakfast 08/28/15   Golden Circle, FNP  LORazepam (ATIVAN) 0.5 MG tablet TAKE ONE TABLET BY MOUTH TWICE DAILY AS NEEDED FOR ANXIETY 02/20/16   Costin Karlyne Greenspan,  MD  meloxicam (MOBIC) 15 MG tablet Take 1 tablet by mouth  daily 01/05/16   Golden Circle, FNP  metFORMIN (GLUCOPHAGE) 500 MG tablet Take 2 tablets by mouth  daily 01/08/16   Golden Circle, FNP  Multiple Vitamin (MULTIVITAMIN) tablet Take 1 tablet by mouth daily.    Historical Provider, MD  NONFORMULARY OR COMPOUNDED ITEM AFO for R leg for dropped foot Dx history of cva 06/18/14   Rosalita Chessman Chase, DO  sertraline (ZOLOFT) 100 MG tablet Take 1 tablet by mouth  daily 01/08/16   Golden Circle, FNP  traMADol (ULTRAM) 50 MG tablet Take 1 tablet (50 mg total) by mouth every 8 (eight) hours as needed. Patient not taking:  Reported on 09/08/2016 04/21/16   Golden Circle, FNP    Family History Family History  Problem Relation Age of Onset  . Diabetes Mother     Deceased  . Diabetes Brother   . Healthy Son     Social History Social History  Substance Use Topics  . Smoking status: Former Smoker    Types: Cigarettes    Quit date: 02/15/1992  . Smokeless tobacco: Never Used  . Alcohol use No     Comment: Socially     Allergies   Sulfa antibiotics and Paxil [paroxetine hcl]   Review of Systems Review of Systems  Constitutional: Negative for chills and fever.  HENT: Negative for congestion and rhinorrhea.   Eyes: Negative for redness and visual disturbance.  Respiratory: Negative for shortness of breath and wheezing.   Cardiovascular: Negative for chest pain and palpitations.  Gastrointestinal: Negative for nausea and vomiting.  Genitourinary: Negative for dysuria and urgency.  Musculoskeletal: Negative for arthralgias and myalgias.  Skin: Negative for pallor and wound.  Neurological: Negative for dizziness and headaches.       Confusion      Physical Exam Updated Vital Signs BP 132/78   Pulse 84   Temp 98 F (36.7 C) (Oral)   Resp 20   Ht 5\' 2"  (1.575 m)   Wt 165 lb (74.8 kg)   SpO2 99%   BMI 30.18 kg/m   Physical Exam  Constitutional: She appears well-developed and well-nourished. No distress.  HENT:  Head: Normocephalic and atraumatic.  Eyes: EOM are normal. Pupils are equal, round, and reactive to light.  Neck: Normal range of motion. Neck supple.  Cardiovascular: Normal rate and regular rhythm.  Exam reveals no gallop and no friction rub.   No murmur heard. Pulmonary/Chest: Effort normal. She has no wheezes. She has no rales.  Abdominal: Soft. She exhibits no distension. There is no tenderness.  Musculoskeletal: She exhibits no edema or tenderness.  Neurological: She is alert. No cranial nerve deficit or sensory deficit. Coordination normal. GCS eye subscore is 4. GCS  verbal subscore is 5. GCS motor subscore is 6. She displays no Babinski's sign on the right side. She displays no Babinski's sign on the left side.  Reflex Scores:      Tricep reflexes are 2+ on the right side and 2+ on the left side.      Bicep reflexes are 2+ on the right side and 2+ on the left side.      Brachioradialis reflexes are 2+ on the right side and 2+ on the left side.      Patellar reflexes are 2+ on the right side and 2+ on the left side.      Achilles reflexes are 2+ on the right side  and 2+ on the left side. R foot drop.  Very mild RLE weakness compared to L.   Skin: Skin is warm and dry. She is not diaphoretic.  Psychiatric: She has a normal mood and affect. Her behavior is normal.  Nursing note and vitals reviewed.    ED Treatments / Results  Labs (all labs ordered are listed, but only abnormal results are displayed) Labs Reviewed  URINALYSIS, ROUTINE W REFLEX MICROSCOPIC (NOT AT [facility]) - Abnormal; Notable for the following:       Result Value   Specific Gravity, Urine 1.042 (*)    Glucose, UA >1000 (*)    Ketones, ur 15 (*)    All other components within normal limits  COMPREHENSIVE METABOLIC PANEL - Abnormal; Notable for the following:    Glucose, Bld 313 (*)    Creatinine, Ser 1.05 (*)    GFR calc non Af Amer 54 (*)    All other components within normal limits  CBG MONITORING, ED - Abnormal; Notable for the following:    Glucose-Capillary 318 (*)    All other components within normal limits  I-STAT CHEM 8, ED - Abnormal; Notable for the following:    Glucose, Bld 309 (*)    Calcium, Ion 1.11 (*)    All other components within normal limits  I-STAT VENOUS BLOOD GAS, ED - Abnormal; Notable for the following:    pCO2, Ven 38.2 (*)    pO2, Ven 47.0 (*)    All other components within normal limits  CBC WITH DIFFERENTIAL/PLATELET  URINE MICROSCOPIC-ADD ON  BLOOD GAS, VENOUS    EKG  EKG Interpretation None       Radiology Dg Chest 2 View  Result  Date: 09/08/2016 CLINICAL DATA:  Status post fall today.  Confusion. EXAM: CHEST  2 VIEW COMPARISON:  PA and lateral chest 02/17/2016. FINDINGS: The lungs are clear. Heart size is normal. No pneumothorax or pleural effusion. Thoracolumbar scoliosis and multilevel degenerative disease are identified. IMPRESSION: No acute disease. Electronically Signed   By: Inge Rise M.D.   On: 09/08/2016 08:53   Dg Lumbar Spine 2-3 Views  Result Date: 09/08/2016 CLINICAL DATA:  65 year old female fell from standing. Left knee pain. Initial encounter. EXAM: LUMBAR SPINE - 2-3 VIEW COMPARISON:  Lumbar MRI 02/08/2015.  Lumbar radiographs 03/01/2013. FINDINGS: Calcified aortic atherosclerosis. Hypoplastic ribs at T12, otherwise normal lumbar segmentation. This is the same numbering system used on the 2016 MRI. Stable lumbar vertebral height and alignment. Visible lower thoracic levels appear intact, dextro convex lower thoracic scoliosis. Degenerative vacuum disc has progressed at L1-L2. Disc space loss and endplate spurring there. Relatively preserved lumbar disc spaces elsewhere. Widespread lower thoracic disc space loss and endplate spurring. Moderate lower lumbar facet hypertrophy. Visible sacrum appears stable and intact. IMPRESSION: 1.  No acute fracture or listhesis identified in the lumbar spine. 2. Progressed and severe L1-L2 disc degeneration since 2014. Moderate lower lumbar facet degeneration. 3.  Calcified aortic atherosclerosis. Electronically Signed   By: Genevie Ann M.D.   On: 09/08/2016 09:00   Ct Head Wo Contrast  Result Date: 09/08/2016 CLINICAL DATA:  Fall from bed today. EXAM: CT HEAD WITHOUT CONTRAST CT CERVICAL SPINE WITHOUT CONTRAST TECHNIQUE: Multidetector CT imaging of the head and cervical spine was performed following the standard protocol without intravenous contrast. Multiplanar CT image reconstructions of the cervical spine were also generated. COMPARISON:  02/17/2016 FINDINGS: CT HEAD FINDINGS  Brain: There is atrophy and chronic small vessel disease changes. No acute intracranial  abnormality. Specifically, no hemorrhage, hydrocephalus, mass lesion, acute infarction, or significant intracranial injury. Vascular: No hyperdense vessel or unexpected calcification. Skull: No acute bony abnormality or focal bone lesion. Sinuses/Orbits: Visualized paranasal sinuses and mastoids clear. Orbital soft tissues unremarkable. Other: None CT CERVICAL SPINE FINDINGS Alignment: Loss of normal cervical lordosis. Slight anterolisthesis of C3 on C4 and C4 on C5. This is likely related to facet disease. Skull base and vertebrae: No acute fracture. No primary bone lesion or focal pathologic process. Soft tissues and spinal canal: No prevertebral fluid or swelling. No visible canal hematoma. Disc levels: Disc space narrowing and spurring at C5-6 compatible with mild degenerative changes. Early degenerative facet disease bilaterally. Upper chest: Negative. Other: None IMPRESSION: No acute intracranial abnormality. Atrophy, chronic small vessel disease. No acute bony abnormality in the cervical spine. Electronically Signed   By: Rolm Baptise M.D.   On: 09/08/2016 09:07   Ct Cervical Spine Wo Contrast  Result Date: 09/08/2016 CLINICAL DATA:  Fall from bed today. EXAM: CT HEAD WITHOUT CONTRAST CT CERVICAL SPINE WITHOUT CONTRAST TECHNIQUE: Multidetector CT imaging of the head and cervical spine was performed following the standard protocol without intravenous contrast. Multiplanar CT image reconstructions of the cervical spine were also generated. COMPARISON:  02/17/2016 FINDINGS: CT HEAD FINDINGS Brain: There is atrophy and chronic small vessel disease changes. No acute intracranial abnormality. Specifically, no hemorrhage, hydrocephalus, mass lesion, acute infarction, or significant intracranial injury. Vascular: No hyperdense vessel or unexpected calcification. Skull: No acute bony abnormality or focal bone lesion.  Sinuses/Orbits: Visualized paranasal sinuses and mastoids clear. Orbital soft tissues unremarkable. Other: None CT CERVICAL SPINE FINDINGS Alignment: Loss of normal cervical lordosis. Slight anterolisthesis of C3 on C4 and C4 on C5. This is likely related to facet disease. Skull base and vertebrae: No acute fracture. No primary bone lesion or focal pathologic process. Soft tissues and spinal canal: No prevertebral fluid or swelling. No visible canal hematoma. Disc levels: Disc space narrowing and spurring at C5-6 compatible with mild degenerative changes. Early degenerative facet disease bilaterally. Upper chest: Negative. Other: None IMPRESSION: No acute intracranial abnormality. Atrophy, chronic small vessel disease. No acute bony abnormality in the cervical spine. Electronically Signed   By: Rolm Baptise M.D.   On: 09/08/2016 09:07   Dg Knee Complete 4 Views Left  Result Date: 09/08/2016 CLINICAL DATA:  65 year old female fell from standing. Left knee pain. Initial encounter. EXAM: LEFT KNEE - COMPLETE 4+ VIEW COMPARISON:  None. FINDINGS: Chondrocalcinosis. Moderate to severe tricompartmental degenerative spurring. Small suprapatellar joint effusion visible on the lateral view. Patella intact. Severe no acute fracture or dislocation identified. Calcified peripheral vascular disease. IMPRESSION: *No acute fracture or dislocation identified about the left knee. *Small joint effusion. *Up to severe tricompartmental degenerative changes, and superimposed Chondrocalcinosis which can be seen in the setting of calcium pyrophosphate deposition disease. Electronically Signed   By: Genevie Ann M.D.   On: 09/08/2016 08:55    Procedures Procedures (including critical care time)  Medications Ordered in ED Medications  lactated ringers bolus 2,000 mL (0 mLs Intravenous Stopped 09/09/16 1157)     Initial Impression / Assessment and Plan / ED Course  I have reviewed the triage vital signs and the nursing  notes.  Pertinent labs & imaging results that were available during my care of the patient were reviewed by me and considered in my medical decision making (see chart for details).  Clinical Course    65 yo F With a chief complaint of altered mental  status as well as hyperglycemia. I discussed the case with the provider who saw the patient yesterday. She had talked with the nursing home yesterday and they had stated that this was a off and on confusion for 2 weeks. She also discussed the case with her primary care physician who stated that they've been trying to see her for 2 weeks and she has not made it to any appointments. Patient shows no focal signs of infection. I will repeat a UA obtain a laboratory evaluation to assess for DKA. Patient has clear lung sounds and has not been coughing or congested. See no reason for repeat x-ray. Patient has no focal neurologic deficits other than those that were from her previous stroke.   Not in DKA, discussed with family will take to PCP for eval.  6:21 PM:  I have discussed the diagnosis/risks/treatment options with the patient and family and believe the pt to be eligible for discharge home to follow-up with PCP. We also discussed returning to the ED immediately if new or worsening sx occur. We discussed the sx which are most concerning (e.g., sudden worsening pain, fever, inability to tolerate by mouth) that necessitate immediate return. Medications administered to the patient during their visit and any new prescriptions provided to the patient are listed below.  Medications given during this visit Medications  lactated ringers bolus 2,000 mL (0 mLs Intravenous Stopped 09/09/16 1157)     The patient appears reasonably screen and/or stabilized for discharge and I doubt any other medical condition or other [facility] requiring further screening, evaluation, or treatment in the ED at this time prior to discharge.   Final Clinical Impressions(s) / ED Diagnoses    Final diagnoses:  Hyperglycemia  Transient alteration of awareness    New Prescriptions Discharge Medication List as of 09/09/2016 11:55 AM       Deno Etienne, DO 09/09/16 1821

## 2016-09-16 ENCOUNTER — Encounter: Payer: Self-pay | Admitting: Family

## 2016-09-16 ENCOUNTER — Ambulatory Visit (INDEPENDENT_AMBULATORY_CARE_PROVIDER_SITE_OTHER): Payer: Medicare Other | Admitting: Family

## 2016-09-16 ENCOUNTER — Other Ambulatory Visit (INDEPENDENT_AMBULATORY_CARE_PROVIDER_SITE_OTHER): Payer: Medicare Other

## 2016-09-16 VITALS — BP 124/64 | HR 86 | Temp 98.2°F | Resp 16 | Ht 62.0 in | Wt 185.0 lb

## 2016-09-16 DIAGNOSIS — Z23 Encounter for immunization: Secondary | ICD-10-CM

## 2016-09-16 DIAGNOSIS — E1142 Type 2 diabetes mellitus with diabetic polyneuropathy: Secondary | ICD-10-CM | POA: Diagnosis not present

## 2016-09-16 DIAGNOSIS — R413 Other amnesia: Secondary | ICD-10-CM | POA: Diagnosis not present

## 2016-09-16 DIAGNOSIS — R296 Repeated falls: Secondary | ICD-10-CM | POA: Insufficient documentation

## 2016-09-16 LAB — COMPREHENSIVE METABOLIC PANEL
ALT: 29 U/L (ref 0–35)
AST: 25 U/L (ref 0–37)
Albumin: 3.6 g/dL (ref 3.5–5.2)
Alkaline Phosphatase: 119 U/L — ABNORMAL HIGH (ref 39–117)
BUN: 15 mg/dL (ref 6–23)
CHLORIDE: 104 meq/L (ref 96–112)
CO2: 25 mEq/L (ref 19–32)
Calcium: 9 mg/dL (ref 8.4–10.5)
Creatinine, Ser: 1.24 mg/dL — ABNORMAL HIGH (ref 0.40–1.20)
GFR: 45.94 mL/min — ABNORMAL LOW (ref >60.00)
GLUCOSE: 252 mg/dL — AB (ref 70–99)
POTASSIUM: 4.5 meq/L (ref 3.5–5.1)
SODIUM: 136 meq/L (ref 135–145)
Total Bilirubin: 0.4 mg/dL (ref 0.2–1.2)
Total Protein: 6.8 g/dL (ref 6.0–8.3)

## 2016-09-16 LAB — POCT GLYCOSYLATED HEMOGLOBIN (HGB A1C): HEMOGLOBIN A1C: 10.9

## 2016-09-16 MED ORDER — METFORMIN HCL ER 500 MG PO TB24: 1000.0000 mg | ORAL_TABLET | Freq: Every day | ORAL | 2 refills | Status: DC

## 2016-09-16 MED ORDER — DULAGLUTIDE 0.75 MG/0.5ML ~~LOC~~ SOAJ: 0.7500 mg | SUBCUTANEOUS | 0 refills | Status: DC

## 2016-09-16 NOTE — Progress Notes (Signed)
Subjective:    Patient ID: Sheila [name], female    DOB: August 14, 1951, 66 y.o.   MRN: [id number]  Chief Complaint  Patient presents with  . Hospitalization Follow-up    sugars have been running high still     HPI:  Sheila [name] is a 65 y.o. female who  has a past medical history of Arthritis; Diabetes mellitus; Hyperlipidemia; and Stroke ([place]) (2015). and presents today for a follow up office visit.   1.) Diabetes - currently maintained on metformin and glipizide. Per assisted living facility documentation patient takes the medication as prescribed and has no adverse side effects. Blood sugars have been significantly elevated over the course of the past month ranging from 266-412. Son indicates that she has been following a low carbohydrate diet. Continues to experience neuropathy located in her bilateral feet.  Lab Results  Component Value Date   HGBA1C 10.9 09/16/2016   2.) Weakness - She recently has experienced a 2 falls that she has been seen in the ED for with no siginficant injury. Previously able to ambulate with a Rollator. Has been have some experience some weakness in her lower legs in combination with the numbness or tingling.   3.) Memory changes - patient and family have noted the associated symptoms of change in memory and has been going on for several weeks now. Notes she is becoming more forgetful at times and repeating herself on several occasions. Is confused easily.   Allergies  Allergen Reactions  . Sulfa Antibiotics   . Paxil [Paroxetine Hcl] Other (See Comments)    Dreams/ nightmares      Outpatient Medications Prior to Visit  Medication Sig Dispense Refill  . atorvastatin (LIPITOR) 40 MG tablet Take 1 tablet (40 mg total) by mouth daily at 6 PM.    . clopidogrel (PLAVIX) 75 MG tablet Take 1 tablet (75 mg total) by mouth daily. 30 tablet   . fish oil-omega-3 fatty acids 1000 MG capsule Take 1 g by mouth daily.    Marland Kitchen gabapentin (NEURONTIN) 300 MG  capsule Take 1 capsule by mouth at  bedtime 30 capsule 0  . glipiZIDE (GLUCOTROL) 10 MG tablet Take 1 tablet by mouth  daily before breakfast 90 tablet 1  . LORazepam (ATIVAN) 0.5 MG tablet TAKE ONE TABLET BY MOUTH TWICE DAILY AS NEEDED FOR ANXIETY 30 tablet 0  . Multiple Vitamin (MULTIVITAMIN) tablet Take 1 tablet by mouth daily.    . sertraline (ZOLOFT) 100 MG tablet Take 1 tablet by mouth  daily 90 tablet 1  . cyclobenzaprine (FLEXERIL) 10 MG tablet TAKE ONE TABLET BY MOUTH  THREE TIMES DAILY AS NEEDED FOR MUSCLE SPASM 90 tablet 0  . metFORMIN (GLUCOPHAGE) 500 MG tablet Take 2 tablets by mouth  daily 180 tablet 0  . meloxicam (MOBIC) 15 MG tablet Take 1 tablet by mouth  daily 90 tablet 0  . NONFORMULARY OR COMPOUNDED ITEM AFO for R leg for dropped foot Dx history of cva 1 each 0  . traMADol (ULTRAM) 50 MG tablet Take 1 tablet (50 mg total) by mouth every 8 (eight) hours as needed. (Patient not taking: Reported on 09/08/2016) 60 tablet 0   No facility-administered medications prior to visit.       Past Surgical History:  Procedure Laterality Date  . CHOLECYSTECTOMY    . TONSILLECTOMY        Past Medical History:  Diagnosis Date  . Arthritis   . Diabetes mellitus  complicated by peripheral neuropathy  . Hyperlipidemia   . Stroke [facility]) 2015   residual right foot drop      Review of Systems  Eyes:       Denies changes in vision  Respiratory: Negative for chest tightness and shortness of breath.   Cardiovascular: Negative for chest pain, palpitations and leg swelling.  Endocrine: Negative for polydipsia, polyphagia and polyuria.  Neurological: Positive for numbness. Negative for dizziness and weakness.      Objective:    BP 124/64 (BP Location: Right Arm, Patient Position: Sitting, Cuff Size: Large)   Pulse 86   Temp 98.2 F (36.8 C) (Oral)   Resp 16   Ht _0  (1.575 m)   Wt 185 lb (83.9 kg)   SpO2 97%   BMI 33.84 kg/m  Nursing note and vital signs  reviewed.  Physical Exam  Constitutional: She is oriented to person, place, and time. She appears well-developed and well-nourished. No distress.  Cardiovascular: Normal rate, regular rhythm, normal heart sounds and intact distal pulses.   Pulmonary/Chest: Effort normal and breath sounds normal.  Musculoskeletal:  Bilateral lower extremity weakness with left>right in hip flexion and knee extension. Distal pulses and sensation are intake and appropriate.   Neurological: She is alert and oriented to person, place, and time.  Skin: Skin is warm and dry.  Psychiatric: She has a normal mood and affect. Her behavior is normal. Judgment and thought content normal. Cognition and memory are impaired.       Assessment & Plan:   Problem List Items Addressed This Visit      Endocrine   Type 2 diabetes mellitus with neurologic complication ([place]) - Primary    Type 2 diabetes remains uncontrolled with current medication regimen and new A1c of 10.9. Discontinue metformin. Start metformin extended release. Continue current dosage of glipizide. Start elicited. Continue to monitor blood sugars twice daily. Currently maintained on atorvastatin for CAD risk reduction. Encouraged to complete diabetic eye exam independently. Diabetic foot exam completed today. Will discuss Pneumovax at next office visit.      Relevant Medications   metFORMIN (GLUCOPHAGE XR) 500 MG 24 hr tablet   Dulaglutide (TRULICITY) 8.93 YB/0.1BP SOPN   Other Relevant Orders   Comp Met (CMET) (Completed)   POCT HgB A1C (Completed)     Other   Memory changes    Patient and son expressed recent memory changes and forgetting things more often. Refer to neuropsychology for further assessment and treatment.      Relevant Orders   Ambulatory referral to Neuropsychology   Multiple falls    Patient has experienced multiple falls recently with no significant injuries however son indicates significant weakness in her lower extremities. This  is appreciated on physical exam as described. Recommend physical therapy to increase balance and strength as well as perform safety assessment.      Relevant Orders   Ambulatory referral to Physical Therapy    Other Visit Diagnoses   None.      I have discontinued Ms. Zeitz's NONFORMULARY OR COMPOUNDED ITEM, cyclobenzaprine, meloxicam, metFORMIN, and traMADol. I am also having her start on metFORMIN and Dulaglutide. Additionally, I am having her maintain her fish oil-omega-3 fatty acids, multivitamin, glipiZIDE, gabapentin, sertraline, clopidogrel, atorvastatin, and LORazepam.   Meds ordered this encounter  Medications  . metFORMIN (GLUCOPHAGE XR) 500 MG 24 hr tablet    Sig: Take 2 tablets (1,000 mg total) by mouth daily with breakfast.    Dispense:  60 tablet  Refill:  2    Order Specific Question:   Supervising Provider    Answer:   Pricilla Holm A [[id number]]  . Dulaglutide (TRULICITY) 9.24 QA/8.3MH SOPN    Sig: Inject 0.75 mg into the skin once a week.    Dispense:  4 pen    Refill:  0    Order Specific Question:   Supervising Provider    Answer:   Pricilla Holm A [[id number]]     Follow-up: Return in about 1 month (around 10/16/2016), or if symptoms worsen or fail to improve.  Mauricio Po, FNP

## 2016-09-16 NOTE — Assessment & Plan Note (Signed)
Type 2 diabetes remains uncontrolled with current medication regimen and new A1c of 10.9. Discontinue metformin. Start metformin extended release. Continue current dosage of glipizide. Start elicited. Continue to monitor blood sugars twice daily. Currently maintained on atorvastatin for CAD risk reduction. Encouraged to complete diabetic eye exam independently. Diabetic foot exam completed today. Will discuss Pneumovax at next office visit.

## 2016-09-16 NOTE — Assessment & Plan Note (Signed)
Patient has experienced multiple falls recently with no significant injuries however son indicates significant weakness in her lower extremities. This is appreciated on physical exam as described. Recommend physical therapy to increase balance and strength as well as perform safety assessment.

## 2016-09-16 NOTE — Patient Instructions (Signed)
Thank you for choosing [facility].  SUMMARY AND INSTRUCTIONS:  They will call to schedule appointments with neuropsych and PT.   Medication:  Please continue to take your medications as prescribed.  Start Metformin XR 1000 mg daily.  Start Trulicity weekly.   Your prescription(s) have been submitted to your pharmacy or been printed and provided for you. Please take as directed and contact our office if you believe you are having problem(s) with the medication(s) or have any questions.  Labs:  Please stop by the lab on the lower level of the building for your blood work. Your results will be released to [place] (or called to you) after review, usually within 72 hours after test completion. If any changes need to be made, you will be notified at that same time.  1.) The lab is open from 7:30am to 5:30 pm Monday-Friday 2.) No appointment is necessary 3.) Fasting (if needed) is 6-8 hours after food and drink; black coffee and water are okay   Follow up:  If your symptoms worsen or fail to improve, please contact our office for further instruction, or in case of emergency go directly to the emergency room at the closest medical facility.

## 2016-09-16 NOTE — Assessment & Plan Note (Signed)
Patient and son expressed recent memory changes and forgetting things more often. Refer to neuropsychology for further assessment and treatment.

## 2016-09-20 ENCOUNTER — Telehealth: Payer: Self-pay | Admitting: Emergency Medicine

## 2016-09-20 ENCOUNTER — Telehealth: Payer: Self-pay | Admitting: Family

## 2016-09-20 NOTE — Telephone Encounter (Signed)
[facility] called and wants to know if Terri Piedra got Pt orders that were faxed over on patient. She asked that you call her back about this. She is going to refax them just in case thanks.

## 2016-09-20 NOTE — Telephone Encounter (Signed)
Form faxed. Called to let them know.

## 2016-09-20 NOTE — Telephone Encounter (Signed)
Bernette Mayers is calling to advise that the order that we faxed in was missing the most recent visit notes and demographics sheet. Please fax this in to her @ [phone]

## 2016-09-21 NOTE — Telephone Encounter (Signed)
Faxed recent office notes and demographics to [phone]

## 2016-09-22 ENCOUNTER — Telehealth: Payer: Self-pay | Admitting: Family

## 2016-09-22 MED ORDER — BASAGLAR KWIKPEN 100 UNIT/ML ~~LOC~~ SOPN: 10.0000 [IU] | PEN_INJECTOR | Freq: Every day | SUBCUTANEOUS | 0 refills | Status: DC

## 2016-09-22 NOTE — Telephone Encounter (Signed)
Basaglar sent to pharmacy to help improve blood sugars. Has she started the Trulicity?

## 2016-09-22 NOTE — Telephone Encounter (Signed)
Nursing home call to report her blood sugar reading, 9/27 morning 306, 9/22----344,9/23----312,9/24----356, 9/26---316. They are faxing over the information right now.

## 2016-09-23 ENCOUNTER — Telehealth: Payer: Self-pay | Admitting: Family

## 2016-09-23 ENCOUNTER — Other Ambulatory Visit: Payer: Self-pay

## 2016-09-23 NOTE — Telephone Encounter (Signed)
Noted! Thank you

## 2016-09-23 NOTE — Telephone Encounter (Signed)
[facility] called to let you know they will be opening the case tomorrow.

## 2016-09-23 NOTE — Telephone Encounter (Signed)
[facility] and pt has been taking trulicity. Advised that we sent in another insulin to pharmacy.

## 2016-09-24 ENCOUNTER — Other Ambulatory Visit: Payer: Self-pay

## 2016-09-24 ENCOUNTER — Encounter: Payer: Self-pay | Admitting: Emergency Medicine

## 2016-09-24 DIAGNOSIS — E1142 Type 2 diabetes mellitus with diabetic polyneuropathy: Secondary | ICD-10-CM | POA: Diagnosis not present

## 2016-09-24 DIAGNOSIS — E1151 Type 2 diabetes mellitus with diabetic peripheral angiopathy without gangrene: Secondary | ICD-10-CM | POA: Diagnosis not present

## 2016-09-24 DIAGNOSIS — E1165 Type 2 diabetes mellitus with hyperglycemia: Secondary | ICD-10-CM | POA: Diagnosis not present

## 2016-09-24 DIAGNOSIS — M21371 Foot drop, right foot: Secondary | ICD-10-CM | POA: Diagnosis not present

## 2016-09-24 DIAGNOSIS — Z794 Long term (current) use of insulin: Secondary | ICD-10-CM | POA: Diagnosis not present

## 2016-09-24 DIAGNOSIS — I69398 Other sequelae of cerebral infarction: Secondary | ICD-10-CM | POA: Diagnosis not present

## 2016-09-24 DIAGNOSIS — R413 Other amnesia: Secondary | ICD-10-CM | POA: Diagnosis not present

## 2016-09-24 DIAGNOSIS — Z9181 History of falling: Secondary | ICD-10-CM | POA: Diagnosis not present

## 2016-09-24 DIAGNOSIS — Z7902 Long term (current) use of antithrombotics/antiplatelets: Secondary | ICD-10-CM | POA: Diagnosis not present

## 2016-09-24 DIAGNOSIS — E785 Hyperlipidemia, unspecified: Secondary | ICD-10-CM | POA: Diagnosis not present

## 2016-09-24 DIAGNOSIS — R296 Repeated falls: Secondary | ICD-10-CM | POA: Diagnosis not present

## 2016-09-24 DIAGNOSIS — Z87440 Personal history of urinary (tract) infections: Secondary | ICD-10-CM | POA: Diagnosis not present

## 2016-09-24 MED ORDER — LORAZEPAM 0.5 MG PO TABS: ORAL_TABLET | ORAL | 0 refills | Status: DC

## 2016-09-27 DIAGNOSIS — M21371 Foot drop, right foot: Secondary | ICD-10-CM | POA: Diagnosis not present

## 2016-09-27 DIAGNOSIS — Z7902 Long term (current) use of antithrombotics/antiplatelets: Secondary | ICD-10-CM | POA: Diagnosis not present

## 2016-09-27 DIAGNOSIS — E1151 Type 2 diabetes mellitus with diabetic peripheral angiopathy without gangrene: Secondary | ICD-10-CM | POA: Diagnosis not present

## 2016-09-27 DIAGNOSIS — E1165 Type 2 diabetes mellitus with hyperglycemia: Secondary | ICD-10-CM | POA: Diagnosis not present

## 2016-09-27 DIAGNOSIS — Z87440 Personal history of urinary (tract) infections: Secondary | ICD-10-CM | POA: Diagnosis not present

## 2016-09-27 DIAGNOSIS — Z9181 History of falling: Secondary | ICD-10-CM | POA: Diagnosis not present

## 2016-09-27 DIAGNOSIS — E1142 Type 2 diabetes mellitus with diabetic polyneuropathy: Secondary | ICD-10-CM | POA: Diagnosis not present

## 2016-09-27 DIAGNOSIS — I69398 Other sequelae of cerebral infarction: Secondary | ICD-10-CM | POA: Diagnosis not present

## 2016-09-27 DIAGNOSIS — R296 Repeated falls: Secondary | ICD-10-CM | POA: Diagnosis not present

## 2016-09-27 DIAGNOSIS — R413 Other amnesia: Secondary | ICD-10-CM | POA: Diagnosis not present

## 2016-09-27 DIAGNOSIS — Z794 Long term (current) use of insulin: Secondary | ICD-10-CM | POA: Diagnosis not present

## 2016-09-27 DIAGNOSIS — E785 Hyperlipidemia, unspecified: Secondary | ICD-10-CM | POA: Diagnosis not present

## 2016-09-28 ENCOUNTER — Telehealth: Payer: Self-pay

## 2016-09-28 DIAGNOSIS — I69398 Other sequelae of cerebral infarction: Secondary | ICD-10-CM | POA: Diagnosis not present

## 2016-09-28 DIAGNOSIS — E1151 Type 2 diabetes mellitus with diabetic peripheral angiopathy without gangrene: Secondary | ICD-10-CM | POA: Diagnosis not present

## 2016-09-28 DIAGNOSIS — R296 Repeated falls: Secondary | ICD-10-CM | POA: Diagnosis not present

## 2016-09-28 DIAGNOSIS — E785 Hyperlipidemia, unspecified: Secondary | ICD-10-CM | POA: Diagnosis not present

## 2016-09-28 DIAGNOSIS — Z9181 History of falling: Secondary | ICD-10-CM | POA: Diagnosis not present

## 2016-09-28 DIAGNOSIS — Z87440 Personal history of urinary (tract) infections: Secondary | ICD-10-CM | POA: Diagnosis not present

## 2016-09-28 DIAGNOSIS — Z794 Long term (current) use of insulin: Secondary | ICD-10-CM | POA: Diagnosis not present

## 2016-09-28 DIAGNOSIS — E1142 Type 2 diabetes mellitus with diabetic polyneuropathy: Secondary | ICD-10-CM | POA: Diagnosis not present

## 2016-09-28 DIAGNOSIS — E1165 Type 2 diabetes mellitus with hyperglycemia: Secondary | ICD-10-CM | POA: Diagnosis not present

## 2016-09-28 DIAGNOSIS — Z7902 Long term (current) use of antithrombotics/antiplatelets: Secondary | ICD-10-CM | POA: Diagnosis not present

## 2016-09-28 DIAGNOSIS — M21371 Foot drop, right foot: Secondary | ICD-10-CM | POA: Diagnosis not present

## 2016-09-28 DIAGNOSIS — R413 Other amnesia: Secondary | ICD-10-CM | POA: Diagnosis not present

## 2016-09-28 NOTE — Telephone Encounter (Signed)
Advised that they increase medication to 20 units.

## 2016-09-28 NOTE — Telephone Encounter (Signed)
Is she still taking glipizide and trulicity as well? Can increase basaglar to 20 units daily (from 10 per notes).

## 2016-09-28 NOTE — Telephone Encounter (Signed)
Pts nursing home sent over fax that states that her sugar was 381 earlier today. This is while taking lantus and metformin. Please advise on any medication changes that you'd recommend.

## 2016-09-28 NOTE — Telephone Encounter (Signed)
Spoke with [facility] to verify what diabetic medication pt was taking. They are going to fax over med list to direct fax # on side B

## 2016-09-29 DIAGNOSIS — R413 Other amnesia: Secondary | ICD-10-CM | POA: Diagnosis not present

## 2016-09-29 DIAGNOSIS — E785 Hyperlipidemia, unspecified: Secondary | ICD-10-CM | POA: Diagnosis not present

## 2016-09-29 DIAGNOSIS — Z794 Long term (current) use of insulin: Secondary | ICD-10-CM | POA: Diagnosis not present

## 2016-09-29 DIAGNOSIS — R296 Repeated falls: Secondary | ICD-10-CM | POA: Diagnosis not present

## 2016-09-29 DIAGNOSIS — E1142 Type 2 diabetes mellitus with diabetic polyneuropathy: Secondary | ICD-10-CM | POA: Diagnosis not present

## 2016-09-29 DIAGNOSIS — M21371 Foot drop, right foot: Secondary | ICD-10-CM | POA: Diagnosis not present

## 2016-09-29 DIAGNOSIS — I69398 Other sequelae of cerebral infarction: Secondary | ICD-10-CM | POA: Diagnosis not present

## 2016-09-29 DIAGNOSIS — Z9181 History of falling: Secondary | ICD-10-CM | POA: Diagnosis not present

## 2016-09-29 DIAGNOSIS — Z87440 Personal history of urinary (tract) infections: Secondary | ICD-10-CM | POA: Diagnosis not present

## 2016-09-29 DIAGNOSIS — E1151 Type 2 diabetes mellitus with diabetic peripheral angiopathy without gangrene: Secondary | ICD-10-CM | POA: Diagnosis not present

## 2016-09-29 DIAGNOSIS — Z7902 Long term (current) use of antithrombotics/antiplatelets: Secondary | ICD-10-CM | POA: Diagnosis not present

## 2016-09-29 DIAGNOSIS — E1165 Type 2 diabetes mellitus with hyperglycemia: Secondary | ICD-10-CM | POA: Diagnosis not present

## 2016-09-30 DIAGNOSIS — Z7902 Long term (current) use of antithrombotics/antiplatelets: Secondary | ICD-10-CM | POA: Diagnosis not present

## 2016-09-30 DIAGNOSIS — Z9181 History of falling: Secondary | ICD-10-CM | POA: Diagnosis not present

## 2016-09-30 DIAGNOSIS — Z794 Long term (current) use of insulin: Secondary | ICD-10-CM | POA: Diagnosis not present

## 2016-09-30 DIAGNOSIS — E1165 Type 2 diabetes mellitus with hyperglycemia: Secondary | ICD-10-CM | POA: Diagnosis not present

## 2016-09-30 DIAGNOSIS — R296 Repeated falls: Secondary | ICD-10-CM | POA: Diagnosis not present

## 2016-09-30 DIAGNOSIS — R413 Other amnesia: Secondary | ICD-10-CM | POA: Diagnosis not present

## 2016-09-30 DIAGNOSIS — E1151 Type 2 diabetes mellitus with diabetic peripheral angiopathy without gangrene: Secondary | ICD-10-CM | POA: Diagnosis not present

## 2016-09-30 DIAGNOSIS — E1142 Type 2 diabetes mellitus with diabetic polyneuropathy: Secondary | ICD-10-CM | POA: Diagnosis not present

## 2016-09-30 DIAGNOSIS — E785 Hyperlipidemia, unspecified: Secondary | ICD-10-CM | POA: Diagnosis not present

## 2016-09-30 DIAGNOSIS — I69398 Other sequelae of cerebral infarction: Secondary | ICD-10-CM | POA: Diagnosis not present

## 2016-09-30 DIAGNOSIS — M21371 Foot drop, right foot: Secondary | ICD-10-CM | POA: Diagnosis not present

## 2016-09-30 DIAGNOSIS — Z87440 Personal history of urinary (tract) infections: Secondary | ICD-10-CM | POA: Diagnosis not present

## 2016-10-01 DIAGNOSIS — E1165 Type 2 diabetes mellitus with hyperglycemia: Secondary | ICD-10-CM | POA: Diagnosis not present

## 2016-10-01 DIAGNOSIS — M21371 Foot drop, right foot: Secondary | ICD-10-CM | POA: Diagnosis not present

## 2016-10-01 DIAGNOSIS — E785 Hyperlipidemia, unspecified: Secondary | ICD-10-CM | POA: Diagnosis not present

## 2016-10-01 DIAGNOSIS — I69398 Other sequelae of cerebral infarction: Secondary | ICD-10-CM | POA: Diagnosis not present

## 2016-10-01 DIAGNOSIS — E1151 Type 2 diabetes mellitus with diabetic peripheral angiopathy without gangrene: Secondary | ICD-10-CM | POA: Diagnosis not present

## 2016-10-01 DIAGNOSIS — R413 Other amnesia: Secondary | ICD-10-CM | POA: Diagnosis not present

## 2016-10-01 DIAGNOSIS — Z9181 History of falling: Secondary | ICD-10-CM | POA: Diagnosis not present

## 2016-10-01 DIAGNOSIS — E1142 Type 2 diabetes mellitus with diabetic polyneuropathy: Secondary | ICD-10-CM | POA: Diagnosis not present

## 2016-10-01 DIAGNOSIS — R296 Repeated falls: Secondary | ICD-10-CM | POA: Diagnosis not present

## 2016-10-01 DIAGNOSIS — Z7902 Long term (current) use of antithrombotics/antiplatelets: Secondary | ICD-10-CM | POA: Diagnosis not present

## 2016-10-01 DIAGNOSIS — Z87440 Personal history of urinary (tract) infections: Secondary | ICD-10-CM | POA: Diagnosis not present

## 2016-10-01 DIAGNOSIS — Z794 Long term (current) use of insulin: Secondary | ICD-10-CM | POA: Diagnosis not present

## 2016-10-04 ENCOUNTER — Emergency Department (HOSPITAL_COMMUNITY)
Admission: EM | Admit: 2016-10-04 | Discharge: 2016-10-04 | Disposition: A | Discharge status: Home / Self Care | Payer: Medicare Other | Attending: Emergency Medicine | Admitting: Emergency Medicine

## 2016-10-04 ENCOUNTER — Telehealth: Payer: Self-pay | Admitting: Emergency Medicine

## 2016-10-04 ENCOUNTER — Telehealth: Payer: Self-pay | Admitting: Family

## 2016-10-04 ENCOUNTER — Encounter (HOSPITAL_COMMUNITY): Payer: Self-pay | Admitting: Emergency Medicine

## 2016-10-04 DIAGNOSIS — E1065 Type 1 diabetes mellitus with hyperglycemia: Secondary | ICD-10-CM | POA: Diagnosis not present

## 2016-10-04 DIAGNOSIS — R296 Repeated falls: Secondary | ICD-10-CM | POA: Diagnosis not present

## 2016-10-04 DIAGNOSIS — E1165 Type 2 diabetes mellitus with hyperglycemia: Secondary | ICD-10-CM | POA: Insufficient documentation

## 2016-10-04 DIAGNOSIS — M21371 Foot drop, right foot: Secondary | ICD-10-CM | POA: Diagnosis not present

## 2016-10-04 DIAGNOSIS — Z79899 Other long term (current) drug therapy: Secondary | ICD-10-CM | POA: Insufficient documentation

## 2016-10-04 DIAGNOSIS — R413 Other amnesia: Secondary | ICD-10-CM | POA: Diagnosis not present

## 2016-10-04 DIAGNOSIS — Z9181 History of falling: Secondary | ICD-10-CM | POA: Diagnosis not present

## 2016-10-04 DIAGNOSIS — Z87891 Personal history of nicotine dependence: Secondary | ICD-10-CM | POA: Diagnosis not present

## 2016-10-04 DIAGNOSIS — Z7902 Long term (current) use of antithrombotics/antiplatelets: Secondary | ICD-10-CM | POA: Diagnosis not present

## 2016-10-04 DIAGNOSIS — I69398 Other sequelae of cerebral infarction: Secondary | ICD-10-CM | POA: Diagnosis not present

## 2016-10-04 DIAGNOSIS — E1142 Type 2 diabetes mellitus with diabetic polyneuropathy: Secondary | ICD-10-CM | POA: Diagnosis not present

## 2016-10-04 DIAGNOSIS — R739 Hyperglycemia, unspecified: Secondary | ICD-10-CM

## 2016-10-04 DIAGNOSIS — Z794 Long term (current) use of insulin: Secondary | ICD-10-CM | POA: Insufficient documentation

## 2016-10-04 DIAGNOSIS — R7309 Other abnormal glucose: Secondary | ICD-10-CM | POA: Diagnosis not present

## 2016-10-04 DIAGNOSIS — E785 Hyperlipidemia, unspecified: Secondary | ICD-10-CM | POA: Diagnosis not present

## 2016-10-04 DIAGNOSIS — Z87440 Personal history of urinary (tract) infections: Secondary | ICD-10-CM | POA: Diagnosis not present

## 2016-10-04 DIAGNOSIS — E1151 Type 2 diabetes mellitus with diabetic peripheral angiopathy without gangrene: Secondary | ICD-10-CM | POA: Diagnosis not present

## 2016-10-04 DIAGNOSIS — Z7984 Long term (current) use of oral hypoglycemic drugs: Secondary | ICD-10-CM | POA: Insufficient documentation

## 2016-10-04 LAB — BASIC METABOLIC PANEL
ANION GAP: 7 (ref 5–15)
BUN: 20 mg/dL (ref 6–20)
CALCIUM: 9 mg/dL (ref 8.9–10.3)
CO2: 23 mmol/L (ref 22–32)
CREATININE: 1.07 mg/dL — AB (ref 0.44–1.00)
Chloride: 106 mmol/L (ref 101–111)
GFR, EST NON AFRICAN AMERICAN: 53 mL/min — AB (ref >60)
Glucose, Bld: 382 mg/dL — ABNORMAL HIGH (ref 65–99)
Potassium: 4.3 mmol/L (ref 3.5–5.1)
SODIUM: 136 mmol/L (ref 135–145)

## 2016-10-04 LAB — CBC
HCT: 34.8 % — ABNORMAL LOW (ref 36.0–46.0)
HEMOGLOBIN: 12.3 g/dL (ref 12.0–15.0)
MCH: 31 pg (ref 26.0–34.0)
MCHC: 35.3 g/dL (ref 30.0–36.0)
MCV: 87.7 fL (ref 78.0–100.0)
PLATELETS: 231 10*3/uL (ref 150–400)
RBC: 3.97 MIL/uL (ref 3.87–5.11)
RDW: 13.1 % (ref 11.5–15.5)
WBC: 6.6 10*3/uL (ref 4.0–10.5)

## 2016-10-04 LAB — URINE MICROSCOPIC-ADD ON
Bacteria, UA: NONE SEEN
RBC / HPF: NONE SEEN RBC/hpf (ref 0–5)

## 2016-10-04 LAB — CBG MONITORING, ED
GLUCOSE-CAPILLARY: 382 mg/dL — AB (ref 65–99)
GLUCOSE-CAPILLARY: 213 mg/dL — AB (ref 65–99)

## 2016-10-04 LAB — URINALYSIS, ROUTINE W REFLEX MICROSCOPIC
BILIRUBIN URINE: NEGATIVE
Glucose, UA: >1000 mg/dL — AB
HGB URINE DIPSTICK: NEGATIVE
Ketones, ur: NEGATIVE mg/dL
Leukocytes, UA: NEGATIVE
NITRITE: NEGATIVE
PROTEIN: NEGATIVE mg/dL
SPECIFIC GRAVITY, URINE: 1.029 (ref 1.005–1.030)
pH: 6 (ref 5.0–8.0)

## 2016-10-04 MED ORDER — SODIUM CHLORIDE 0.9 % IV BOLUS (SEPSIS)
1000.0000 mL | Freq: Once | INTRAVENOUS | Status: AC
  Administered 2016-10-04: 1000 mL via INTRAVENOUS

## 2016-10-04 NOTE — ED Notes (Signed)
Son called and expressed concern that he was unsure if she had been getting/taking her meds because her CBG has been high a lot recently. According to [facility] sent from [facility] it indicates that she has been taking/receiving her meds.

## 2016-10-04 NOTE — ED Notes (Signed)
Urine collection hat placed in toilet when pt used restroom; urine did went behind hat and was not collected; will try again later

## 2016-10-04 NOTE — Telephone Encounter (Signed)
Called back and spoke with [facility], she was the original caller that hung up before our Nurse could pick up the call. Based on our RN recommendations, advised Sheila [name] that patient should go to the ER.

## 2016-10-04 NOTE — ED Provider Notes (Signed)
  Sheila [name] is a 66 y.o. female with hx of NIDDM presents with hyperglycemia in the 400's. She has a hx of dementia and was sent for evaluation due to her elevated blood sugars..  No hx of DKA.  Pt is without complaint.    LEVEL 5 CAVEAT for dementia    Physical Exam  BP 159/68   Pulse 73   Temp 98.4 F (36.9 C) (Oral)   Resp 16   SpO2 97%   Physical Exam  Constitutional: She appears well-developed and well-nourished. No distress.  HENT:  Head: Normocephalic.  Eyes: Conjunctivae are normal. No scleral icterus.  Neck: Normal range of motion.  Cardiovascular: Normal rate and intact distal pulses.   Pulmonary/Chest: Effort normal.  Musculoskeletal: Normal range of motion.  Neurological: She is alert.  Skin: Skin is warm and dry.    ED Course  Procedures  Plan: UA pending.  AG of 7.    If UTI, treat.  Plan for f/u with PCP for further evaluation at d/c.     MDM  Clinical Course  Value Comment By Time  Leukocytes, UA: NEGATIVE No evidence of UTI. Cristen Bredeson, PA-C 10/10 [id number]  Glucose-Capillary: (!) 213 Repeat glucose improved. Marte Celani, PA-C 10/10 0241  BP: 141/86 Vital signs stable. Denver Bentson, PA-C 10/10 [phone]   Discussed findings with patient and family. Discussed dietary choices and medication compliance. Patient family is at bedside and reports they will assist her with making a follow-up appointment and increasing her nutrition. Joyceann Kruser, PA-C 10/10 0241      1. Hyperglycemia      Abigail Butts, PA-C 10/05/16 [id number]    Daleen Bo, MD 10/05/16 [phone]

## 2016-10-04 NOTE — Telephone Encounter (Signed)
Noted. We will have to follow up and increase her insulin.

## 2016-10-04 NOTE — Telephone Encounter (Signed)
[facility] called an stated the patients blood sugar was 419. They are suppose to call if it gets over 300. Since MD and CMA are out of the office I but them on hold to ask a nurse what they suggest. They hung up on me as soon as I put them on hold. Was unable to get name or number. Please advise thanks.

## 2016-10-04 NOTE — ED Provider Notes (Signed)
[place] DEPT Provider Note   CSN: [id number] Arrival date & time: 10/04/16  1826     History   Chief Complaint Chief Complaint  Patient presents with  . Hyperglycemia    HPI Sheila [name] is a 66 y.o. female.  Patient is 65 yo F with PMH of diabetes, acute VBA thalamic stroke, and memory changes, presenting to ED via EMS from [facility] assisted living facility for evaluation of elevated CBG of 419. Was evaluated in ED on 9/14 for similar episode of hyperglycemia. Patient has no complaints and denies any medical history, and is confused why she is here. Unable to obtain history from patient, and level 5 caveat applies secondary to impaired memory/questionable dementia.   Past Medical History:  Diagnosis Date  . Arthritis   . Diabetes mellitus    complicated by peripheral neuropathy  . Hyperlipidemia   . Stroke [facility]) 2015   residual right foot drop    Patient Active Problem List   Diagnosis Date Noted  . Memory changes 09/16/2016  . Multiple falls 09/16/2016  . UTI (urinary tract infection) 02/28/2016  . Depression 02/28/2016  . Acute ischemic VBA thalamic stroke ([place])   . Stroke (cerebrum) ([place])   . Anomia 02/17/2016  . Stroke ([place]) 02/17/2016  . Peripheral arterial disease ([place]) 12/03/2015  . Neuropathy ([place]) 09/05/2015  . Medicare annual wellness visit, subsequent 03/12/2015  . Routine general medical examination at a health care facility 10/08/2014  . Cerumen impaction 10/08/2014  . Low back pain potentially associated with radiculopathy 03/01/2013  . Hyperlipidemia 02/15/2012  . Type 2 diabetes mellitus with neurologic complication ([place]) [id number]    Past Surgical History:  Procedure Laterality Date  . CHOLECYSTECTOMY    . TONSILLECTOMY      OB History    No data available       Home Medications    Prior to Admission medications   Medication Sig Start Date End Date Taking? Authorizing Provider  atorvastatin (LIPITOR) 40 MG tablet  Take 1 tablet (40 mg total) by mouth daily at 6 PM. 02/20/16   Costin Karlyne Greenspan, MD  clopidogrel (PLAVIX) 75 MG tablet Take 1 tablet (75 mg total) by mouth daily. 02/20/16   Costin Karlyne Greenspan, MD  Dulaglutide (TRULICITY) A999333 0000000 SOPN Inject 0.75 mg into the skin once a week. 09/16/16   Golden Circle, FNP  fish oil-omega-3 fatty acids 1000 MG capsule Take 1 g by mouth daily.    Historical Provider, MD  gabapentin (NEURONTIN) 300 MG capsule Take 1 capsule by mouth at  bedtime 12/16/15   Golden Circle, FNP  glipiZIDE (GLUCOTROL) 10 MG tablet Take 1 tablet by mouth  daily before breakfast 08/28/15   Golden Circle, FNP  Insulin Glargine (BASAGLAR KWIKPEN) 100 UNIT/ML SOPN Inject 0.1 mLs (10 Units total) into the skin at bedtime. 09/22/16   Golden Circle, FNP  LORazepam (ATIVAN) 0.5 MG tablet TAKE ONE TABLET BY MOUTH TWICE DAILY AS NEEDED FOR ANXIETY 09/24/16   Golden Circle, FNP  metFORMIN (GLUCOPHAGE XR) 500 MG 24 hr tablet Take 2 tablets (1,000 mg total) by mouth daily with breakfast. 09/16/16   Golden Circle, FNP  Multiple Vitamin (MULTIVITAMIN) tablet Take 1 tablet by mouth daily.    Historical Provider, MD  sertraline (ZOLOFT) 100 MG tablet Take 1 tablet by mouth  daily 01/08/16   Golden Circle, FNP    Family History Family History  Problem Relation Age of Onset  .  Diabetes Mother     Deceased  . Diabetes Brother   . Healthy Son     Social History Social History  Substance Use Topics  . Smoking status: Former Smoker    Types: Cigarettes    Quit date: 02/15/1992  . Smokeless tobacco: Never Used  . Alcohol use No     Comment: Socially     Allergies   Sulfa antibiotics and Paxil [paroxetine hcl]   Review of Systems Review of Systems  Unable to perform ROS: Other (memory changes/questionable dementia)     Physical Exam Updated Vital Signs BP 136/66 (BP Location: Left Arm)   Pulse 76   Temp 98.4 F (36.9 C) (Oral)   Resp 16   SpO2 98%   Physical Exam    Constitutional: She appears well-developed and well-nourished.  Pleasantly confused, no acute distress  HENT:  Head: Normocephalic and atraumatic.  Mouth/Throat: Oropharynx is clear and moist.  Eyes: Conjunctivae and EOM are normal. Pupils are equal, round, and reactive to light.  Neck: Normal range of motion.  Cardiovascular: Normal rate, regular rhythm, normal heart sounds and intact distal pulses.   Pulmonary/Chest: Effort normal and breath sounds normal. No respiratory distress.  Abdominal: Soft. There is no tenderness.  Musculoskeletal: Normal range of motion.  Neurological: She is alert.  Speech is clear and goal oriented, follows commands Cranial nerves III - XII without deficit, no facial droop Oriented to person only  Skin: Skin is warm and dry.  Psychiatric: She has a normal mood and affect.  Nursing note and vitals reviewed.    ED Treatments / Results  Labs (all labs ordered are listed, but only abnormal results are displayed) Labs Reviewed  BASIC METABOLIC PANEL - Abnormal; Notable for the following:       Result Value   Glucose, Bld 382 (*)    Creatinine, Ser 1.07 (*)    GFR calc non Af Amer 53 (*)    All other components within normal limits  CBC - Abnormal; Notable for the following:    HCT 34.8 (*)    All other components within normal limits  CBG MONITORING, ED - Abnormal; Notable for the following:    Glucose-Capillary 382 (*)    All other components within normal limits  URINALYSIS, ROUTINE W REFLEX MICROSCOPIC (NOT AT [facility])    EKG  EKG Interpretation None       Radiology No results found.  Procedures Procedures (including critical care time)  Medications Ordered in ED Medications - No data to display   Initial Impression / Assessment and Plan / ED Course  I have reviewed the triage vital signs and the nursing notes.  Pertinent labs & imaging results that were available during my care of the patient were reviewed by me and considered in  my medical decision making (see chart for details).  Clinical Course   Patient is 65 yo F with PMH of diabetes and memory changes/questionable dementia, presenting to ED from [facility] assisted living facility for evaluation of CBG of 419. Evaluated in ED on 9/14 for similar episode of hyperglycemia. Patient has no other complaints. Repeat CBG 382 on arrival in ED, and BMP otherwise unremarkable with anion gap of 7. Given 2 L NS, with repeat CBG and urinalysis in process at time of shift change. Discussed case with Abigail Butts, PA-C, who will d/c patient back to assisted living facility pending results of urinalysis and CBG.   Final Clinical Impressions(s) / ED Diagnoses   Final  diagnoses:  None    New Prescriptions New Prescriptions   No medications on file     Rosilyn Mings II, [place] 10/04/16 2145    Milton Ferguson, MD 10/04/16 2225

## 2016-10-04 NOTE — Telephone Encounter (Signed)
Patient is a diabetic.  Patient has ran out of strips.  Will get a new glucose meter in tonight.  Was unable to get reading for this morning.  Will hopefully be able to get at 4pm.  Is requesting call back in regard.

## 2016-10-04 NOTE — ED Triage Notes (Signed)
Pt presents via EMS from [facility]. Pt is asymptomatic but states that she had a CBG 419 at the facility and was sent out for evaluation. Anxious but alert and oriened per norm.

## 2016-10-04 NOTE — Discharge Instructions (Signed)
1. Medications: usual home medications °2. Treatment: rest, drink plenty of fluids,  °3. Follow Up: Please followup with your primary doctor in 1-2 days for discussion of your diagnoses and further evaluation after today's visit; if you do not have a primary care doctor use the resource guide provided to find one; Please return to the ER for worsening symptoms ° °

## 2016-10-04 NOTE — ED Notes (Signed)
Patient was unable to urinate ,nurse notified.

## 2016-10-04 NOTE — ED Provider Notes (Signed)
8:20 PM I have seen and evaluated this patient. She was sent from nursing home for elevated blood sugars. Patient has no complaints. Her vital signs are normal. She's afebrile. Her sugar is 382. Electrolytes are unremarkable, denying gap is 7. Doubt DKA. Urinalysis pending.  Pt signed out at shift change. She is in NAD. Pending UA. Plan to DC home with PCP follow up.    Jeannett Senior, PA-C 10/05/16 2013    Milton Ferguson, MD 10/06/16 2308

## 2016-10-04 NOTE — ED Notes (Signed)
Patient confused at this time, informed RN [facility]. Encouraged patient to use call bell if needed.

## 2016-10-05 DIAGNOSIS — M21371 Foot drop, right foot: Secondary | ICD-10-CM | POA: Diagnosis not present

## 2016-10-05 DIAGNOSIS — Z794 Long term (current) use of insulin: Secondary | ICD-10-CM | POA: Diagnosis not present

## 2016-10-05 DIAGNOSIS — E1165 Type 2 diabetes mellitus with hyperglycemia: Secondary | ICD-10-CM | POA: Diagnosis not present

## 2016-10-05 DIAGNOSIS — I69398 Other sequelae of cerebral infarction: Secondary | ICD-10-CM | POA: Diagnosis not present

## 2016-10-05 DIAGNOSIS — E1151 Type 2 diabetes mellitus with diabetic peripheral angiopathy without gangrene: Secondary | ICD-10-CM | POA: Diagnosis not present

## 2016-10-05 DIAGNOSIS — E1142 Type 2 diabetes mellitus with diabetic polyneuropathy: Secondary | ICD-10-CM | POA: Diagnosis not present

## 2016-10-05 DIAGNOSIS — E785 Hyperlipidemia, unspecified: Secondary | ICD-10-CM | POA: Diagnosis not present

## 2016-10-05 DIAGNOSIS — R296 Repeated falls: Secondary | ICD-10-CM | POA: Diagnosis not present

## 2016-10-05 DIAGNOSIS — Z9181 History of falling: Secondary | ICD-10-CM | POA: Diagnosis not present

## 2016-10-05 DIAGNOSIS — Z87440 Personal history of urinary (tract) infections: Secondary | ICD-10-CM | POA: Diagnosis not present

## 2016-10-05 DIAGNOSIS — Z7902 Long term (current) use of antithrombotics/antiplatelets: Secondary | ICD-10-CM | POA: Diagnosis not present

## 2016-10-05 DIAGNOSIS — R413 Other amnesia: Secondary | ICD-10-CM | POA: Diagnosis not present

## 2016-10-05 NOTE — Telephone Encounter (Signed)
[facility] called to let us know that pts blood sugars are over 400s. Advised them per Marya Amsler to increase basaglar to 30 units.

## 2016-10-06 DIAGNOSIS — E1165 Type 2 diabetes mellitus with hyperglycemia: Secondary | ICD-10-CM | POA: Diagnosis not present

## 2016-10-06 DIAGNOSIS — R413 Other amnesia: Secondary | ICD-10-CM | POA: Diagnosis not present

## 2016-10-06 DIAGNOSIS — R296 Repeated falls: Secondary | ICD-10-CM | POA: Diagnosis not present

## 2016-10-06 DIAGNOSIS — E785 Hyperlipidemia, unspecified: Secondary | ICD-10-CM | POA: Diagnosis not present

## 2016-10-06 DIAGNOSIS — E1151 Type 2 diabetes mellitus with diabetic peripheral angiopathy without gangrene: Secondary | ICD-10-CM | POA: Diagnosis not present

## 2016-10-06 DIAGNOSIS — Z794 Long term (current) use of insulin: Secondary | ICD-10-CM | POA: Diagnosis not present

## 2016-10-06 DIAGNOSIS — Z87440 Personal history of urinary (tract) infections: Secondary | ICD-10-CM | POA: Diagnosis not present

## 2016-10-06 DIAGNOSIS — M21371 Foot drop, right foot: Secondary | ICD-10-CM | POA: Diagnosis not present

## 2016-10-06 DIAGNOSIS — E1142 Type 2 diabetes mellitus with diabetic polyneuropathy: Secondary | ICD-10-CM | POA: Diagnosis not present

## 2016-10-06 DIAGNOSIS — Z7902 Long term (current) use of antithrombotics/antiplatelets: Secondary | ICD-10-CM | POA: Diagnosis not present

## 2016-10-06 DIAGNOSIS — Z9181 History of falling: Secondary | ICD-10-CM | POA: Diagnosis not present

## 2016-10-06 DIAGNOSIS — I69398 Other sequelae of cerebral infarction: Secondary | ICD-10-CM | POA: Diagnosis not present

## 2016-10-07 DIAGNOSIS — R413 Other amnesia: Secondary | ICD-10-CM | POA: Diagnosis not present

## 2016-10-07 DIAGNOSIS — E1151 Type 2 diabetes mellitus with diabetic peripheral angiopathy without gangrene: Secondary | ICD-10-CM | POA: Diagnosis not present

## 2016-10-07 DIAGNOSIS — E785 Hyperlipidemia, unspecified: Secondary | ICD-10-CM | POA: Diagnosis not present

## 2016-10-07 DIAGNOSIS — Z87440 Personal history of urinary (tract) infections: Secondary | ICD-10-CM | POA: Diagnosis not present

## 2016-10-07 DIAGNOSIS — Z794 Long term (current) use of insulin: Secondary | ICD-10-CM | POA: Diagnosis not present

## 2016-10-07 DIAGNOSIS — Z9181 History of falling: Secondary | ICD-10-CM | POA: Diagnosis not present

## 2016-10-07 DIAGNOSIS — Z7902 Long term (current) use of antithrombotics/antiplatelets: Secondary | ICD-10-CM | POA: Diagnosis not present

## 2016-10-07 DIAGNOSIS — M21371 Foot drop, right foot: Secondary | ICD-10-CM | POA: Diagnosis not present

## 2016-10-07 DIAGNOSIS — E1142 Type 2 diabetes mellitus with diabetic polyneuropathy: Secondary | ICD-10-CM | POA: Diagnosis not present

## 2016-10-07 DIAGNOSIS — R296 Repeated falls: Secondary | ICD-10-CM | POA: Diagnosis not present

## 2016-10-07 DIAGNOSIS — E1165 Type 2 diabetes mellitus with hyperglycemia: Secondary | ICD-10-CM | POA: Diagnosis not present

## 2016-10-07 DIAGNOSIS — I69398 Other sequelae of cerebral infarction: Secondary | ICD-10-CM | POA: Diagnosis not present

## 2016-10-08 DIAGNOSIS — R413 Other amnesia: Secondary | ICD-10-CM | POA: Diagnosis not present

## 2016-10-08 DIAGNOSIS — M21371 Foot drop, right foot: Secondary | ICD-10-CM | POA: Diagnosis not present

## 2016-10-08 DIAGNOSIS — E1142 Type 2 diabetes mellitus with diabetic polyneuropathy: Secondary | ICD-10-CM | POA: Diagnosis not present

## 2016-10-08 DIAGNOSIS — E1165 Type 2 diabetes mellitus with hyperglycemia: Secondary | ICD-10-CM | POA: Diagnosis not present

## 2016-10-08 DIAGNOSIS — I69398 Other sequelae of cerebral infarction: Secondary | ICD-10-CM | POA: Diagnosis not present

## 2016-10-08 DIAGNOSIS — R296 Repeated falls: Secondary | ICD-10-CM | POA: Diagnosis not present

## 2016-10-08 DIAGNOSIS — E1151 Type 2 diabetes mellitus with diabetic peripheral angiopathy without gangrene: Secondary | ICD-10-CM | POA: Diagnosis not present

## 2016-10-08 DIAGNOSIS — Z7902 Long term (current) use of antithrombotics/antiplatelets: Secondary | ICD-10-CM | POA: Diagnosis not present

## 2016-10-08 DIAGNOSIS — Z9181 History of falling: Secondary | ICD-10-CM | POA: Diagnosis not present

## 2016-10-08 DIAGNOSIS — Z87440 Personal history of urinary (tract) infections: Secondary | ICD-10-CM | POA: Diagnosis not present

## 2016-10-08 DIAGNOSIS — Z794 Long term (current) use of insulin: Secondary | ICD-10-CM | POA: Diagnosis not present

## 2016-10-08 DIAGNOSIS — E785 Hyperlipidemia, unspecified: Secondary | ICD-10-CM | POA: Diagnosis not present

## 2016-10-11 DIAGNOSIS — Z794 Long term (current) use of insulin: Secondary | ICD-10-CM | POA: Diagnosis not present

## 2016-10-11 DIAGNOSIS — Z9181 History of falling: Secondary | ICD-10-CM | POA: Diagnosis not present

## 2016-10-11 DIAGNOSIS — M21371 Foot drop, right foot: Secondary | ICD-10-CM | POA: Diagnosis not present

## 2016-10-11 DIAGNOSIS — I69398 Other sequelae of cerebral infarction: Secondary | ICD-10-CM | POA: Diagnosis not present

## 2016-10-11 DIAGNOSIS — R296 Repeated falls: Secondary | ICD-10-CM | POA: Diagnosis not present

## 2016-10-11 DIAGNOSIS — Z87440 Personal history of urinary (tract) infections: Secondary | ICD-10-CM | POA: Diagnosis not present

## 2016-10-11 DIAGNOSIS — Z7902 Long term (current) use of antithrombotics/antiplatelets: Secondary | ICD-10-CM | POA: Diagnosis not present

## 2016-10-11 DIAGNOSIS — E1165 Type 2 diabetes mellitus with hyperglycemia: Secondary | ICD-10-CM | POA: Diagnosis not present

## 2016-10-11 DIAGNOSIS — E1142 Type 2 diabetes mellitus with diabetic polyneuropathy: Secondary | ICD-10-CM | POA: Diagnosis not present

## 2016-10-11 DIAGNOSIS — E1151 Type 2 diabetes mellitus with diabetic peripheral angiopathy without gangrene: Secondary | ICD-10-CM | POA: Diagnosis not present

## 2016-10-11 DIAGNOSIS — R413 Other amnesia: Secondary | ICD-10-CM | POA: Diagnosis not present

## 2016-10-11 DIAGNOSIS — E785 Hyperlipidemia, unspecified: Secondary | ICD-10-CM | POA: Diagnosis not present

## 2016-10-12 DIAGNOSIS — E1142 Type 2 diabetes mellitus with diabetic polyneuropathy: Secondary | ICD-10-CM | POA: Diagnosis not present

## 2016-10-12 DIAGNOSIS — Z87440 Personal history of urinary (tract) infections: Secondary | ICD-10-CM | POA: Diagnosis not present

## 2016-10-12 DIAGNOSIS — R296 Repeated falls: Secondary | ICD-10-CM | POA: Diagnosis not present

## 2016-10-12 DIAGNOSIS — M21371 Foot drop, right foot: Secondary | ICD-10-CM | POA: Diagnosis not present

## 2016-10-12 DIAGNOSIS — Z794 Long term (current) use of insulin: Secondary | ICD-10-CM | POA: Diagnosis not present

## 2016-10-12 DIAGNOSIS — R413 Other amnesia: Secondary | ICD-10-CM | POA: Diagnosis not present

## 2016-10-12 DIAGNOSIS — Z7902 Long term (current) use of antithrombotics/antiplatelets: Secondary | ICD-10-CM | POA: Diagnosis not present

## 2016-10-12 DIAGNOSIS — Z9181 History of falling: Secondary | ICD-10-CM | POA: Diagnosis not present

## 2016-10-12 DIAGNOSIS — E1165 Type 2 diabetes mellitus with hyperglycemia: Secondary | ICD-10-CM | POA: Diagnosis not present

## 2016-10-12 DIAGNOSIS — E1151 Type 2 diabetes mellitus with diabetic peripheral angiopathy without gangrene: Secondary | ICD-10-CM | POA: Diagnosis not present

## 2016-10-12 DIAGNOSIS — I69398 Other sequelae of cerebral infarction: Secondary | ICD-10-CM | POA: Diagnosis not present

## 2016-10-12 DIAGNOSIS — E785 Hyperlipidemia, unspecified: Secondary | ICD-10-CM | POA: Diagnosis not present

## 2016-10-13 DIAGNOSIS — Z87440 Personal history of urinary (tract) infections: Secondary | ICD-10-CM | POA: Diagnosis not present

## 2016-10-13 DIAGNOSIS — Z9181 History of falling: Secondary | ICD-10-CM | POA: Diagnosis not present

## 2016-10-13 DIAGNOSIS — E1142 Type 2 diabetes mellitus with diabetic polyneuropathy: Secondary | ICD-10-CM | POA: Diagnosis not present

## 2016-10-13 DIAGNOSIS — R296 Repeated falls: Secondary | ICD-10-CM | POA: Diagnosis not present

## 2016-10-13 DIAGNOSIS — E1151 Type 2 diabetes mellitus with diabetic peripheral angiopathy without gangrene: Secondary | ICD-10-CM | POA: Diagnosis not present

## 2016-10-13 DIAGNOSIS — R413 Other amnesia: Secondary | ICD-10-CM | POA: Diagnosis not present

## 2016-10-13 DIAGNOSIS — I69398 Other sequelae of cerebral infarction: Secondary | ICD-10-CM | POA: Diagnosis not present

## 2016-10-13 DIAGNOSIS — E785 Hyperlipidemia, unspecified: Secondary | ICD-10-CM | POA: Diagnosis not present

## 2016-10-13 DIAGNOSIS — E1165 Type 2 diabetes mellitus with hyperglycemia: Secondary | ICD-10-CM | POA: Diagnosis not present

## 2016-10-13 DIAGNOSIS — Z794 Long term (current) use of insulin: Secondary | ICD-10-CM | POA: Diagnosis not present

## 2016-10-13 DIAGNOSIS — M21371 Foot drop, right foot: Secondary | ICD-10-CM | POA: Diagnosis not present

## 2016-10-13 DIAGNOSIS — Z7902 Long term (current) use of antithrombotics/antiplatelets: Secondary | ICD-10-CM | POA: Diagnosis not present

## 2016-10-14 ENCOUNTER — Telehealth: Payer: Self-pay

## 2016-10-14 MED ORDER — INSULIN GLARGINE 100 UNIT/ML SOLOSTAR PEN: 40.0000 [IU] | PEN_INJECTOR | Freq: Every day | SUBCUTANEOUS | 2 refills | Status: DC

## 2016-10-14 NOTE — Telephone Encounter (Signed)
Pt is completely out of lantus and needs a new rx sent to [facility] asap. Thanks

## 2016-10-14 NOTE — Telephone Encounter (Signed)
[facility] is aware.

## 2016-10-14 NOTE — Telephone Encounter (Signed)
New prescription for Lantus sent.

## 2016-10-15 DIAGNOSIS — E1142 Type 2 diabetes mellitus with diabetic polyneuropathy: Secondary | ICD-10-CM | POA: Diagnosis not present

## 2016-10-15 DIAGNOSIS — R413 Other amnesia: Secondary | ICD-10-CM | POA: Diagnosis not present

## 2016-10-15 DIAGNOSIS — I69398 Other sequelae of cerebral infarction: Secondary | ICD-10-CM | POA: Diagnosis not present

## 2016-10-15 DIAGNOSIS — R296 Repeated falls: Secondary | ICD-10-CM | POA: Diagnosis not present

## 2016-10-15 DIAGNOSIS — Z7902 Long term (current) use of antithrombotics/antiplatelets: Secondary | ICD-10-CM | POA: Diagnosis not present

## 2016-10-15 DIAGNOSIS — Z87440 Personal history of urinary (tract) infections: Secondary | ICD-10-CM | POA: Diagnosis not present

## 2016-10-15 DIAGNOSIS — M21371 Foot drop, right foot: Secondary | ICD-10-CM | POA: Diagnosis not present

## 2016-10-15 DIAGNOSIS — Z9181 History of falling: Secondary | ICD-10-CM | POA: Diagnosis not present

## 2016-10-15 DIAGNOSIS — E1151 Type 2 diabetes mellitus with diabetic peripheral angiopathy without gangrene: Secondary | ICD-10-CM | POA: Diagnosis not present

## 2016-10-15 DIAGNOSIS — E1165 Type 2 diabetes mellitus with hyperglycemia: Secondary | ICD-10-CM | POA: Diagnosis not present

## 2016-10-15 DIAGNOSIS — E785 Hyperlipidemia, unspecified: Secondary | ICD-10-CM | POA: Diagnosis not present

## 2016-10-15 DIAGNOSIS — Z794 Long term (current) use of insulin: Secondary | ICD-10-CM | POA: Diagnosis not present

## 2016-10-18 DIAGNOSIS — E1151 Type 2 diabetes mellitus with diabetic peripheral angiopathy without gangrene: Secondary | ICD-10-CM | POA: Diagnosis not present

## 2016-10-18 DIAGNOSIS — E1165 Type 2 diabetes mellitus with hyperglycemia: Secondary | ICD-10-CM | POA: Diagnosis not present

## 2016-10-18 DIAGNOSIS — R413 Other amnesia: Secondary | ICD-10-CM | POA: Diagnosis not present

## 2016-10-18 DIAGNOSIS — Z7902 Long term (current) use of antithrombotics/antiplatelets: Secondary | ICD-10-CM | POA: Diagnosis not present

## 2016-10-18 DIAGNOSIS — Z9181 History of falling: Secondary | ICD-10-CM | POA: Diagnosis not present

## 2016-10-18 DIAGNOSIS — E785 Hyperlipidemia, unspecified: Secondary | ICD-10-CM | POA: Diagnosis not present

## 2016-10-18 DIAGNOSIS — R296 Repeated falls: Secondary | ICD-10-CM | POA: Diagnosis not present

## 2016-10-18 DIAGNOSIS — I69398 Other sequelae of cerebral infarction: Secondary | ICD-10-CM | POA: Diagnosis not present

## 2016-10-18 DIAGNOSIS — E1142 Type 2 diabetes mellitus with diabetic polyneuropathy: Secondary | ICD-10-CM | POA: Diagnosis not present

## 2016-10-18 DIAGNOSIS — M21371 Foot drop, right foot: Secondary | ICD-10-CM | POA: Diagnosis not present

## 2016-10-18 DIAGNOSIS — Z794 Long term (current) use of insulin: Secondary | ICD-10-CM | POA: Diagnosis not present

## 2016-10-18 DIAGNOSIS — Z87440 Personal history of urinary (tract) infections: Secondary | ICD-10-CM | POA: Diagnosis not present

## 2016-10-19 DIAGNOSIS — E1165 Type 2 diabetes mellitus with hyperglycemia: Secondary | ICD-10-CM | POA: Diagnosis not present

## 2016-10-19 DIAGNOSIS — E785 Hyperlipidemia, unspecified: Secondary | ICD-10-CM | POA: Diagnosis not present

## 2016-10-19 DIAGNOSIS — E1151 Type 2 diabetes mellitus with diabetic peripheral angiopathy without gangrene: Secondary | ICD-10-CM | POA: Diagnosis not present

## 2016-10-19 DIAGNOSIS — Z87440 Personal history of urinary (tract) infections: Secondary | ICD-10-CM | POA: Diagnosis not present

## 2016-10-19 DIAGNOSIS — Z7902 Long term (current) use of antithrombotics/antiplatelets: Secondary | ICD-10-CM | POA: Diagnosis not present

## 2016-10-19 DIAGNOSIS — R296 Repeated falls: Secondary | ICD-10-CM | POA: Diagnosis not present

## 2016-10-19 DIAGNOSIS — R413 Other amnesia: Secondary | ICD-10-CM | POA: Diagnosis not present

## 2016-10-19 DIAGNOSIS — Z9181 History of falling: Secondary | ICD-10-CM | POA: Diagnosis not present

## 2016-10-19 DIAGNOSIS — E1142 Type 2 diabetes mellitus with diabetic polyneuropathy: Secondary | ICD-10-CM | POA: Diagnosis not present

## 2016-10-19 DIAGNOSIS — M21371 Foot drop, right foot: Secondary | ICD-10-CM | POA: Diagnosis not present

## 2016-10-19 DIAGNOSIS — Z794 Long term (current) use of insulin: Secondary | ICD-10-CM | POA: Diagnosis not present

## 2016-10-19 DIAGNOSIS — I69398 Other sequelae of cerebral infarction: Secondary | ICD-10-CM | POA: Diagnosis not present

## 2016-10-20 DIAGNOSIS — E785 Hyperlipidemia, unspecified: Secondary | ICD-10-CM | POA: Diagnosis not present

## 2016-10-20 DIAGNOSIS — Z794 Long term (current) use of insulin: Secondary | ICD-10-CM | POA: Diagnosis not present

## 2016-10-20 DIAGNOSIS — Z87440 Personal history of urinary (tract) infections: Secondary | ICD-10-CM | POA: Diagnosis not present

## 2016-10-20 DIAGNOSIS — M21371 Foot drop, right foot: Secondary | ICD-10-CM | POA: Diagnosis not present

## 2016-10-20 DIAGNOSIS — Z9181 History of falling: Secondary | ICD-10-CM | POA: Diagnosis not present

## 2016-10-20 DIAGNOSIS — E1165 Type 2 diabetes mellitus with hyperglycemia: Secondary | ICD-10-CM | POA: Diagnosis not present

## 2016-10-20 DIAGNOSIS — I69398 Other sequelae of cerebral infarction: Secondary | ICD-10-CM | POA: Diagnosis not present

## 2016-10-20 DIAGNOSIS — R296 Repeated falls: Secondary | ICD-10-CM | POA: Diagnosis not present

## 2016-10-20 DIAGNOSIS — Z7902 Long term (current) use of antithrombotics/antiplatelets: Secondary | ICD-10-CM | POA: Diagnosis not present

## 2016-10-20 DIAGNOSIS — R413 Other amnesia: Secondary | ICD-10-CM | POA: Diagnosis not present

## 2016-10-20 DIAGNOSIS — E1151 Type 2 diabetes mellitus with diabetic peripheral angiopathy without gangrene: Secondary | ICD-10-CM | POA: Diagnosis not present

## 2016-10-20 DIAGNOSIS — E1142 Type 2 diabetes mellitus with diabetic polyneuropathy: Secondary | ICD-10-CM | POA: Diagnosis not present

## 2016-10-21 DIAGNOSIS — Z9181 History of falling: Secondary | ICD-10-CM | POA: Diagnosis not present

## 2016-10-21 DIAGNOSIS — E785 Hyperlipidemia, unspecified: Secondary | ICD-10-CM | POA: Diagnosis not present

## 2016-10-21 DIAGNOSIS — R296 Repeated falls: Secondary | ICD-10-CM | POA: Diagnosis not present

## 2016-10-21 DIAGNOSIS — E1151 Type 2 diabetes mellitus with diabetic peripheral angiopathy without gangrene: Secondary | ICD-10-CM | POA: Diagnosis not present

## 2016-10-21 DIAGNOSIS — M21371 Foot drop, right foot: Secondary | ICD-10-CM | POA: Diagnosis not present

## 2016-10-21 DIAGNOSIS — I69398 Other sequelae of cerebral infarction: Secondary | ICD-10-CM | POA: Diagnosis not present

## 2016-10-21 DIAGNOSIS — Z794 Long term (current) use of insulin: Secondary | ICD-10-CM | POA: Diagnosis not present

## 2016-10-21 DIAGNOSIS — Z87440 Personal history of urinary (tract) infections: Secondary | ICD-10-CM | POA: Diagnosis not present

## 2016-10-21 DIAGNOSIS — E1142 Type 2 diabetes mellitus with diabetic polyneuropathy: Secondary | ICD-10-CM | POA: Diagnosis not present

## 2016-10-21 DIAGNOSIS — R413 Other amnesia: Secondary | ICD-10-CM | POA: Diagnosis not present

## 2016-10-21 DIAGNOSIS — Z7902 Long term (current) use of antithrombotics/antiplatelets: Secondary | ICD-10-CM | POA: Diagnosis not present

## 2016-10-21 DIAGNOSIS — E1165 Type 2 diabetes mellitus with hyperglycemia: Secondary | ICD-10-CM | POA: Diagnosis not present

## 2016-10-26 ENCOUNTER — Telehealth: Payer: Self-pay | Admitting: Family

## 2016-10-26 DIAGNOSIS — Z87440 Personal history of urinary (tract) infections: Secondary | ICD-10-CM | POA: Diagnosis not present

## 2016-10-26 DIAGNOSIS — Z794 Long term (current) use of insulin: Secondary | ICD-10-CM | POA: Diagnosis not present

## 2016-10-26 DIAGNOSIS — E785 Hyperlipidemia, unspecified: Secondary | ICD-10-CM | POA: Diagnosis not present

## 2016-10-26 DIAGNOSIS — E1165 Type 2 diabetes mellitus with hyperglycemia: Secondary | ICD-10-CM | POA: Diagnosis not present

## 2016-10-26 DIAGNOSIS — M21371 Foot drop, right foot: Secondary | ICD-10-CM | POA: Diagnosis not present

## 2016-10-26 DIAGNOSIS — E1151 Type 2 diabetes mellitus with diabetic peripheral angiopathy without gangrene: Secondary | ICD-10-CM | POA: Diagnosis not present

## 2016-10-26 DIAGNOSIS — E1142 Type 2 diabetes mellitus with diabetic polyneuropathy: Secondary | ICD-10-CM | POA: Diagnosis not present

## 2016-10-26 DIAGNOSIS — I69398 Other sequelae of cerebral infarction: Secondary | ICD-10-CM | POA: Diagnosis not present

## 2016-10-26 DIAGNOSIS — Z7902 Long term (current) use of antithrombotics/antiplatelets: Secondary | ICD-10-CM | POA: Diagnosis not present

## 2016-10-26 DIAGNOSIS — Z9181 History of falling: Secondary | ICD-10-CM | POA: Diagnosis not present

## 2016-10-26 DIAGNOSIS — R296 Repeated falls: Secondary | ICD-10-CM | POA: Diagnosis not present

## 2016-10-26 DIAGNOSIS — R413 Other amnesia: Secondary | ICD-10-CM | POA: Diagnosis not present

## 2016-10-26 NOTE — Telephone Encounter (Signed)
Clair Gulling, PT, called from [place] at home request verbal order to extend PT 1 wk 1 and 2 wk 2. Please call him back

## 2016-10-27 NOTE — Telephone Encounter (Signed)
Gave verbal ok per Greg. 

## 2016-11-24 ENCOUNTER — Encounter (HOSPITAL_COMMUNITY): Payer: Self-pay | Admitting: Emergency Medicine

## 2016-11-24 ENCOUNTER — Emergency Department (HOSPITAL_COMMUNITY): Payer: Medicare Other

## 2016-11-24 ENCOUNTER — Emergency Department (HOSPITAL_COMMUNITY)
Admission: EM | Admit: 2016-11-24 | Discharge: 2016-11-24 | Disposition: A | Discharge status: Home / Self Care | Payer: Medicare Other | Attending: Emergency Medicine | Admitting: Emergency Medicine

## 2016-11-24 DIAGNOSIS — H538 Other visual disturbances: Secondary | ICD-10-CM | POA: Diagnosis not present

## 2016-11-24 DIAGNOSIS — E1140 Type 2 diabetes mellitus with diabetic neuropathy, unspecified: Secondary | ICD-10-CM | POA: Insufficient documentation

## 2016-11-24 DIAGNOSIS — Z794 Long term (current) use of insulin: Secondary | ICD-10-CM | POA: Insufficient documentation

## 2016-11-24 DIAGNOSIS — Z87891 Personal history of nicotine dependence: Secondary | ICD-10-CM | POA: Diagnosis not present

## 2016-11-24 DIAGNOSIS — Z8673 Personal history of transient ischemic attack (TIA), and cerebral infarction without residual deficits: Secondary | ICD-10-CM | POA: Insufficient documentation

## 2016-11-24 DIAGNOSIS — R402411 Glasgow coma scale score 13-15, in the field [EMT or ambulance]: Secondary | ICD-10-CM | POA: Diagnosis not present

## 2016-11-24 LAB — BASIC METABOLIC PANEL
Anion gap: 7 (ref 5–15)
BUN: 18 mg/dL (ref 6–20)
CHLORIDE: 107 mmol/L (ref 101–111)
CO2: 26 mmol/L (ref 22–32)
CREATININE: 1.06 mg/dL — AB (ref 0.44–1.00)
Calcium: 9.2 mg/dL (ref 8.9–10.3)
GFR calc Af Amer: >60 mL/min (ref >60)
GFR calc non Af Amer: 53 mL/min — ABNORMAL LOW (ref >60)
GLUCOSE: 91 mg/dL (ref 65–99)
Potassium: 4.3 mmol/L (ref 3.5–5.1)
SODIUM: 140 mmol/L (ref 135–145)

## 2016-11-24 LAB — CBC WITH DIFFERENTIAL/PLATELET
Basophils Absolute: 0 10*3/uL (ref 0.0–0.1)
Basophils Relative: 0 %
EOS ABS: 0.4 10*3/uL (ref 0.0–0.7)
EOS PCT: 6 %
HCT: 36.3 % (ref 36.0–46.0)
HEMOGLOBIN: 12.8 g/dL (ref 12.0–15.0)
LYMPHS ABS: 1.8 10*3/uL (ref 0.7–4.0)
Lymphocytes Relative: 26 %
MCH: 30.8 pg (ref 26.0–34.0)
MCHC: 35.3 g/dL (ref 30.0–36.0)
MCV: 87.5 fL (ref 78.0–100.0)
MONOS PCT: 6 %
Monocytes Absolute: 0.5 10*3/uL (ref 0.1–1.0)
Neutro Abs: 4.4 10*3/uL (ref 1.7–7.7)
Neutrophils Relative %: 62 %
PLATELETS: 268 10*3/uL (ref 150–400)
RBC: 4.15 MIL/uL (ref 3.87–5.11)
RDW: 13.1 % (ref 11.5–15.5)
WBC: 7.1 10*3/uL (ref 4.0–10.5)

## 2016-11-24 NOTE — ED Triage Notes (Signed)
Per EMS, patient had acute onset of blurry vision starting this morning. No other neurological deficits. Hx of dementia. Facility states she is baseline for her mental system. Alert, and oriented x3. Disoriented to time.  Patient is from [facility].

## 2016-11-24 NOTE — ED Notes (Signed)
Spoke with staff at [facility] about patient's discharge instructions. Verbalized understanding.

## 2016-11-24 NOTE — ED Provider Notes (Signed)
[place] [facility] DEPT Provider Note   CSN: [id number] Arrival date & time: 11/24/16  1225     History   Chief Complaint Chief Complaint  Patient presents with  . Blurred Vision    HPI Sheila [name] is a 65 y.o. female.  Patient with h/o stroke, dementia, DM -- presents from her assisted living with c/o blurry vision. When first seen, patient cannot give me a complaint. Upon further questioning, vision in left eye seems to be blurrier than left. She denies flashes or floaters. No new weakness or HA. Level V caveat 2/2 dementia.       Past Medical History:  Diagnosis Date  . Arthritis   . Diabetes mellitus    complicated by peripheral neuropathy  . Hyperlipidemia   . Stroke [facility]) 2015   residual right foot drop    Patient Active Problem List   Diagnosis Date Noted  . Memory changes 09/16/2016  . Multiple falls 09/16/2016  . UTI (urinary tract infection) 02/28/2016  . Depression 02/28/2016  . Acute ischemic VBA thalamic stroke ([place])   . Stroke (cerebrum) ([place])   . Anomia 02/17/2016  . Stroke ([place]) 02/17/2016  . Peripheral arterial disease ([place]) 12/03/2015  . Neuropathy ([place]) 09/05/2015  . Medicare annual wellness visit, subsequent 03/12/2015  . Routine general medical examination at a health care facility 10/08/2014  . Cerumen impaction 10/08/2014  . Low back pain potentially associated with radiculopathy 03/01/2013  . Hyperlipidemia 02/15/2012  . Type 2 diabetes mellitus with neurologic complication ([place]) [id number]    Past Surgical History:  Procedure Laterality Date  . CHOLECYSTECTOMY    . TONSILLECTOMY      OB History    No data available       Home Medications    Prior to Admission medications   Medication Sig Start Date End Date Taking? Authorizing Provider  atorvastatin (LIPITOR) 40 MG tablet Take 1 tablet (40 mg total) by mouth daily at 6 PM. 02/20/16   Costin Karlyne Greenspan, MD  clopidogrel (PLAVIX) 75 MG tablet Take 1 tablet (75 mg total) by  mouth daily. 02/20/16   Costin Karlyne Greenspan, MD  Dulaglutide (TRULICITY) A999333 0000000 SOPN Inject 0.75 mg into the skin once a week. 09/16/16   Golden Circle, FNP  fish oil-omega-3 fatty acids 1000 MG capsule Take 1 g by mouth daily.    Historical Provider, MD  gabapentin (NEURONTIN) 300 MG capsule Take 1 capsule by mouth at  bedtime 12/16/15   Golden Circle, FNP  glipiZIDE (GLUCOTROL) 10 MG tablet Take 1 tablet by mouth  daily before breakfast 08/28/15   Golden Circle, FNP  Insulin Glargine (LANTUS SOLOSTAR) 100 UNIT/ML Solostar Pen Inject 40 Units into the skin daily. 10/14/16   Golden Circle, FNP  LORazepam (ATIVAN) 0.5 MG tablet TAKE ONE TABLET BY MOUTH TWICE DAILY AS NEEDED FOR ANXIETY 09/24/16   Golden Circle, FNP  metFORMIN (GLUCOPHAGE XR) 500 MG 24 hr tablet Take 2 tablets (1,000 mg total) by mouth daily with breakfast. 09/16/16   Golden Circle, FNP  Multiple Vitamin (MULTIVITAMIN) tablet Take 1 tablet by mouth daily.    Historical Provider, MD  sertraline (ZOLOFT) 100 MG tablet Take 1 tablet by mouth  daily 01/08/16   Golden Circle, FNP    Family History Family History  Problem Relation Age of Onset  . Diabetes Mother     Deceased  . Diabetes Brother   . Healthy Son     Social History  Social History  Substance Use Topics  . Smoking status: Former Smoker    Types: Cigarettes    Quit date: 02/15/1992  . Smokeless tobacco: Never Used  . Alcohol use No     Comment: Socially     Allergies   Sulfa antibiotics and Paxil [paroxetine hcl]   Review of Systems Review of Systems  Unable to perform ROS: Dementia     Physical Exam Updated Vital Signs BP 154/89 (BP Location: Left Arm)   Pulse 75   Temp 97.8 F (36.6 C) (Oral)   Resp 16   Ht 5\' 2"  (1.575 m)   Wt 83.9 kg   SpO2 94%   BMI 33.84 kg/m   Physical Exam  Constitutional: She appears well-developed and well-nourished.  HENT:  Head: Normocephalic and atraumatic.  Right Ear: Tympanic membrane,  external ear and ear canal normal.  Left Ear: Tympanic membrane, external ear and ear canal normal.  Nose: Nose normal.  Mouth/Throat: Uvula is midline, oropharynx is clear and moist and mucous membranes are normal.  Eyes: Conjunctivae, EOM and lids are normal. Pupils are equal, round, and reactive to light. Right eye exhibits no nystagmus. Left eye exhibits no nystagmus.  Patient can read fingers at 2 feet with right eye, trouble with left eye.   Neck: Normal range of motion. Neck supple.  Cardiovascular: Normal rate and regular rhythm.   Pulmonary/Chest: Effort normal and breath sounds normal.  Abdominal: Soft. There is no tenderness.  Musculoskeletal:       Cervical back: She exhibits normal range of motion, no tenderness and no bony tenderness.  Neurological: She is alert. She has normal strength and normal reflexes. No cranial nerve deficit or sensory deficit. GCS eye subscore is 4. GCS verbal subscore is 5. GCS motor subscore is 6.  Movement grossly intact in all extremities.   Skin: Skin is warm and dry.  Psychiatric: She has a normal mood and affect.  Nursing note and vitals reviewed.    ED Treatments / Results  Labs (all labs ordered are listed, but only abnormal results are displayed) Labs Reviewed  BASIC METABOLIC PANEL - Abnormal; Notable for the following:       Result Value   Creatinine, Ser 1.06 (*)    GFR calc non Af Amer 53 (*)    All other components within normal limits  CBC WITH DIFFERENTIAL/PLATELET    Radiology Ct Head Wo Contrast  Result Date: 11/24/2016 CLINICAL DATA:  Acute onset of blurred vision beginning this morning. Dementia up. EXAM: CT HEAD WITHOUT CONTRAST TECHNIQUE: Contiguous axial images were obtained from the base of the skull through the vertex without intravenous contrast. COMPARISON:  09/08/2016. FINDINGS: Brain: Generalized brain atrophy. Extensive chronic appearing small vessel ischemic change of the cerebral hemispheric deep and  subcortical white matter. No large vessel territory infarction. No mass lesion, hemorrhage, hydrocephalus or extra-axial collection. Vascular: There is atherosclerotic calcification of the major vessels at the base of the brain. Skull: Normal Sinuses/Orbits: Clear/normal Other: None significant IMPRESSION: No acute finding by CT. Atrophy an extensive chronic small vessel ischemic changes throughout the brain. Electronically Signed   By: Nelson Chimes M.D.   On: 11/24/2016 14:04    Procedures Procedures (including critical care time)   Initial Impression / Assessment and Plan / ED Course  I have reviewed the triage vital signs and the nursing notes.  Pertinent labs & imaging results that were available during my care of the patient were reviewed by me and considered in  my medical decision making (see chart for details).  Clinical Course    Patient seen and examined. Work-up initiated. Case d/w Dr. Roderic Palau.   Vital signs reviewed and are as follows: BP 139/83   Pulse 81   Temp 97.8 F (36.6 C) (Oral)   Resp 17   Ht 5\' 2"  (1.575 m)   Wt 83.9 kg   SpO2 94%   BMI 33.84 kg/m   Dr. Roderic Palau has seen patient. Agrees doubt stroke. Will request appointment from ophthalmology.   I was not able to contact ophthalmology -- patient discharged with contact information as they were eager to leave.   Patient counseled to return if they have weakness in their arms or legs, slurred speech, trouble walking or talking, confusion, trouble with their balance, or if they have any other concerns. Patient verbalizes understanding and agrees with plan.     Visual Acuity  Right Eye Distance:   Left Eye Distance:   Bilateral Distance:    Right Eye Near: R Near: 20/100 Left Eye Near:  L Near: 20/40 Bilateral Near:  20/30    Final Clinical Impressions(s) / ED Diagnoses   Final diagnoses:  Blurry vision   Patient with blurry vision, no vision loss. CT neg. No other CVA symptoms. Most likely this is  related to her eye and not her brain, referral given. However also discussed strict return precautions with patient if symptoms change or progress.   New Prescriptions Discharge Medication List as of 11/24/2016  3:50 PM       Carlisle Cater, PA-C 11/24/16 1757    Milton Ferguson, MD 11/25/16 1047

## 2016-11-24 NOTE — ED Notes (Addendum)
Discharge instructions and follow up care reviewed with patient. Patient verbalized understanding. Patient's son states he will drive patient back to facility.

## 2016-11-24 NOTE — ED Notes (Signed)
Patient transported to CT 

## 2016-11-24 NOTE — Discharge Instructions (Signed)
Please read and follow all provided instructions.  Your diagnoses today include:  1. Blurry vision     Tests performed today include:  CT scan of your head that did not show any serious injury.  Blood counts and electrolytes  Vital signs. See below for your results today.   Medications prescribed:   None  Take any prescribed medications only as directed.  Home care instructions:  Follow any educational materials contained in this packet.  Follow-up instructions: Please follow-up with the eye specialist.  Call ASAP for an appointment.   Return instructions:  SEEK IMMEDIATE MEDICAL ATTENTION IF:  There is confusion or drowsiness.   You cannot awaken the injured person.   You have more than one episode of vomiting.   You notice dizziness or unsteadiness which is getting worse, or inability to walk.   You have convulsions or unconsciousness.   You experience severe, persistent headaches not relieved by Tylenol.  You cannot use arms or legs normally.   There are changes in pupil sizes. (This is the black center in the colored part of the eye)   There is clear or bloody discharge from the nose or ears.   You have change in speech, vision, swallowing, or understanding.   Localized weakness, numbness, tingling, or change in bowel or bladder control.  You have any other emergent concerns.   Your vital signs today were: BP 139/83    Pulse 81    Temp 97.8 F (36.6 C) (Oral)    Resp 17    Ht 5\' 2"  (1.575 m)    Wt 83.9 kg    SpO2 94%    BMI 33.84 kg/m  If your blood pressure (BP) was elevated above 135/85 this visit, please have this repeated by your doctor within one month. --------------

## 2016-12-16 DIAGNOSIS — E119 Type 2 diabetes mellitus without complications: Secondary | ICD-10-CM | POA: Diagnosis not present

## 2016-12-16 DIAGNOSIS — H25813 Combined forms of age-related cataract, bilateral: Secondary | ICD-10-CM | POA: Diagnosis not present

## 2016-12-31 ENCOUNTER — Encounter: Payer: Self-pay | Admitting: Family

## 2016-12-31 ENCOUNTER — Ambulatory Visit (INDEPENDENT_AMBULATORY_CARE_PROVIDER_SITE_OTHER): Payer: Medicare Other | Admitting: Family

## 2016-12-31 ENCOUNTER — Other Ambulatory Visit (INDEPENDENT_AMBULATORY_CARE_PROVIDER_SITE_OTHER): Payer: Medicare Other

## 2016-12-31 VITALS — BP 128/78 | HR 85 | Temp 97.5°F | Resp 16 | Ht 62.0 in

## 2016-12-31 DIAGNOSIS — Z794 Long term (current) use of insulin: Secondary | ICD-10-CM

## 2016-12-31 DIAGNOSIS — L609 Nail disorder, unspecified: Secondary | ICD-10-CM | POA: Insufficient documentation

## 2016-12-31 DIAGNOSIS — Z23 Encounter for immunization: Secondary | ICD-10-CM

## 2016-12-31 DIAGNOSIS — E1142 Type 2 diabetes mellitus with diabetic polyneuropathy: Secondary | ICD-10-CM | POA: Diagnosis not present

## 2016-12-31 LAB — COMPREHENSIVE METABOLIC PANEL
ALBUMIN: 4 g/dL (ref 3.5–5.2)
ALT: 22 U/L (ref 0–35)
AST: 23 U/L (ref 0–37)
Alkaline Phosphatase: 106 U/L (ref 39–117)
BUN: 19 mg/dL (ref 6–23)
CHLORIDE: 103 meq/L (ref 96–112)
CO2: 29 meq/L (ref 19–32)
CREATININE: 1.08 mg/dL (ref 0.40–1.20)
Calcium: 9 mg/dL (ref 8.4–10.5)
GFR: 53.83 mL/min — ABNORMAL LOW (ref >60.00)
Glucose, Bld: 157 mg/dL — ABNORMAL HIGH (ref 70–99)
POTASSIUM: 4.6 meq/L (ref 3.5–5.1)
SODIUM: 139 meq/L (ref 135–145)
Total Bilirubin: 0.6 mg/dL (ref 0.2–1.2)
Total Protein: 7.2 g/dL (ref 6.0–8.3)

## 2016-12-31 LAB — HEMOGLOBIN A1C: HEMOGLOBIN A1C: 5.7 % (ref 4.6–6.5)

## 2016-12-31 MED ORDER — CEPHALEXIN 500 MG PO CAPS: 500.0000 mg | ORAL_CAPSULE | Freq: Four times a day (QID) | ORAL | 0 refills | Status: DC

## 2016-12-31 NOTE — Patient Instructions (Addendum)
Thank you for choosing [facility].  SUMMARY AND INSTRUCTIONS:  Soak in warm water with Epson salt.   Clean with soap and water.   They will call with your referral to podiatry.   Medication:  Keep taking your medication as prescribed.   Your prescription(s) have been submitted to your pharmacy or been printed and provided for you. Please take as directed and contact our office if you believe you are having problem(s) with the medication(s) or have any questions.  Labs:  Please stop by the lab on the lower level of the building for your blood work. Your results will be released to [place] (or called to you) after review, usually within 72 hours after test completion. If any changes need to be made, you will be notified at that same time.  1.) The lab is open from 7:30am to 5:30 pm Monday-Friday 2.) No appointment is necessary 3.) Fasting (if needed) is 6-8 hours after food and drink; black coffee and water are okay   Follow up:  If your symptoms worsen or fail to improve, please contact our office for further instruction, or in case of emergency go directly to the emergency room at the closest medical facility.

## 2016-12-31 NOTE — Assessment & Plan Note (Signed)
Obtain hemoglobin A1c and complete metabolic profile. Blood sugars appear to be with improved control. Prevnar updated today. Diabetic foot exam completed. Due for diabetic eye exam encouraged to be completed independently. Maintained on atorvastatin for CAD risk reduction. Continue current dosage of metformin, Lantus, glipizide, and Trulicity. Continue to monitor.

## 2016-12-31 NOTE — Assessment & Plan Note (Signed)
Unkempt toe nails with dry skin. There is possibility of mild skin infection. Start Keflex. Recommend soaks with Epson salt. Refer to podiatry for nail and foot care. Follow up if symptoms worsen or do not improve.

## 2016-12-31 NOTE — Progress Notes (Signed)
Subjective:    Patient ID: Sheila [name], female    DOB: 1951/09/23, 66 y.o.   MRN: [id number]  Chief Complaint  Patient presents with  . toe issue    toe nails issues, feet numbness, referral to podiatry    HPI:  Sheila [name] is a 66 y.o. female who  has a past medical history of Arthritis; Diabetes mellitus; Hyperlipidemia; and Stroke ([place]) (2015). and presents today for an acute office visit. Her son is present for today's office visit and provides some history secondary to dementia.   1.) Toe Nails - This is a new problem. First noted when her children were visiting noted to have some blood on her sock. Great toe nail on the right appeared to be coming off and is covered by a band-aid. No fevers or chills. There has been some mild discharge with concern for possible infection. There are no modifying factors or attempted treatments.   2.) Type 2 diabetes - Previously noted to have A1c of 10.9 and currently maintained on metformin, glipizide, Lantus, and Trulicity. Blood sugars reported by nursing home appear adequately controlled. Occasional hypoglycemic readings. Lowest reading recorded 65. No adverse side effects. Continues to experience neuropathy located in her bilateral feet and lower extremities. Due for pneumonia vaccination.  Lab Results  Component Value Date   HGBA1C 5.7 12/31/2016     Allergies  Allergen Reactions  . Sulfa Antibiotics   . Paxil [Paroxetine Hcl] Other (See Comments)    Dreams/ nightmares      Outpatient Medications Prior to Visit  Medication Sig Dispense Refill  . atorvastatin (LIPITOR) 40 MG tablet Take 1 tablet (40 mg total) by mouth daily at 6 PM.    . clopidogrel (PLAVIX) 75 MG tablet Take 1 tablet (75 mg total) by mouth daily. 30 tablet   . Dulaglutide (TRULICITY) [id number] [id number] SOPN Inject 0.75 mg into the skin once a week. 4 pen 0  . fish oil-omega-3 fatty acids 1000 MG capsule Take 1 g by mouth daily.    Marland Kitchen gabapentin (NEURONTIN)  300 MG capsule Take 1 capsule by mouth at  bedtime (Patient taking differently: Take 300mg  capsule by mouth at  bedtime) 30 capsule 0  . glipiZIDE (GLUCOTROL) 10 MG tablet Take 1 tablet by mouth  daily before breakfast 90 tablet 1  . Insulin Glargine (LANTUS SOLOSTAR) 100 UNIT/ML Solostar Pen Inject 40 Units into the skin daily. (Patient taking differently: Inject 40 Units into the skin at bedtime. ) 5 pen 2  . LORazepam (ATIVAN) 0.5 MG tablet TAKE ONE TABLET BY MOUTH TWICE DAILY AS NEEDED FOR ANXIETY (Patient taking differently: Take 0.5 mg by mouth 2 (two) times daily as needed for anxiety. ) 30 tablet 0  . meloxicam (MOBIC) 15 MG tablet Take 15 mg by mouth daily.    . metFORMIN (GLUCOPHAGE XR) 500 MG 24 hr tablet Take 2 tablets (1,000 mg total) by mouth daily with breakfast. 60 tablet 2  . Multiple Vitamin (MULTIVITAMIN) tablet Take 1 tablet by mouth daily.    . sertraline (ZOLOFT) 100 MG tablet Take 1 tablet by mouth  daily (Patient taking differently: Take 100mg  tablet by mouth  daily) 90 tablet 1   No facility-administered medications prior to visit.       Past Surgical History:  Procedure Laterality Date  . CHOLECYSTECTOMY    . TONSILLECTOMY        Past Medical History:  Diagnosis Date  . Arthritis   . Diabetes  mellitus    complicated by peripheral neuropathy  . Hyperlipidemia   . Stroke [facility]) 2015   residual right foot drop      Review of Systems  Unable to perform ROS: Dementia  Endocrine: Positive for cold intolerance.      Objective:    BP 128/78 (BP Location: Left Arm, Patient Position: Sitting, Cuff Size: Large)   Pulse 85   Temp 97.5 F (36.4 C) (Oral)   Resp 16   Ht 5\' 2"  (1.575 m)   SpO2 98%  Nursing note and vital signs reviewed.  Physical Exam  Constitutional: She is oriented to person, place, and time. She appears well-developed and well-nourished. No distress.  Cardiovascular: Normal rate, regular rhythm, normal heart sounds and intact distal  pulses.   Pulmonary/Chest: Effort normal and breath sounds normal.  Neurological: She is alert and oriented to person, place, and time.  Skin: Skin is warm and dry.  Toe nails appear poorly maintained with uncontrolled growth. Right great toe nail appears with potential mild infection.   Psychiatric: She has a normal mood and affect. Her behavior is normal. Judgment and thought content normal.       Assessment & Plan:   Problem List Items Addressed This Visit      Endocrine   Type 2 diabetes mellitus with neurologic complication (HCC)    Obtain hemoglobin A1c and complete metabolic profile. Blood sugars appear to be with improved control. Prevnar updated today. Diabetic foot exam completed. Due for diabetic eye exam encouraged to be completed independently. Maintained on atorvastatin for CAD risk reduction. Continue current dosage of metformin, Lantus, glipizide, and Trulicity. Continue to monitor.       Relevant Orders   Hemoglobin A1c (Completed)   Comprehensive metabolic panel (Completed)     Musculoskeletal and Integument   Nail abnormalities - Primary    Unkempt toe nails with dry skin. There is possibility of mild skin infection. Start Keflex. Recommend soaks with Epson salt. Refer to podiatry for nail and foot care. Follow up if symptoms worsen or do not improve.       Relevant Medications   cephALEXin (KEFLEX) 500 MG capsule   Other Relevant Orders   Ambulatory referral to Podiatry    Other Visit Diagnoses    Need for vaccination with 13-polyvalent pneumococcal conjugate vaccine       Relevant Orders   Pneumococcal conjugate vaccine 13-valent IM (Completed)       I am having [name] start on cephALEXin. I am also having her maintain her fish oil-omega-3 fatty acids, multivitamin, glipiZIDE, gabapentin, sertraline, clopidogrel, atorvastatin, metFORMIN, Dulaglutide, LORazepam, Insulin Glargine, and meloxicam.   Meds ordered this encounter  Medications  . cephALEXin  (KEFLEX) 500 MG capsule    Sig: Take 1 capsule (500 mg total) by mouth 4 (four) times daily.    Dispense:  28 capsule    Refill:  0    Order Specific Question:   Supervising Provider    Answer:   Pricilla Holm A [id number]     Follow-up: Return in about 4 months (around 04/30/2017), or if symptoms worsen or fail to improve.  Mauricio Po, FNP

## 2017-01-12 ENCOUNTER — Ambulatory Visit: Payer: Medicare Other | Admitting: Podiatry

## 2017-01-13 ENCOUNTER — Ambulatory Visit: Payer: Medicare Other | Admitting: Podiatry

## 2017-01-21 ENCOUNTER — Ambulatory Visit (INDEPENDENT_AMBULATORY_CARE_PROVIDER_SITE_OTHER): Payer: Medicare Other | Admitting: Podiatry

## 2017-01-21 ENCOUNTER — Encounter: Payer: Self-pay | Admitting: Podiatry

## 2017-01-21 VITALS — BP 139/74 | HR 88 | Resp 16

## 2017-01-21 DIAGNOSIS — M79676 Pain in unspecified toe(s): Secondary | ICD-10-CM

## 2017-01-21 DIAGNOSIS — B351 Tinea unguium: Secondary | ICD-10-CM | POA: Diagnosis not present

## 2017-01-21 NOTE — Progress Notes (Signed)
   Subjective:    Patient ID: Sheila [name], female    DOB: 07/03/1951, 66 y.o.   MRN: [id number]  HPI The patient presents to the office today with right foot; great toenail that has separated from the nail bed 1 week ago.  The patient is a diabetic and son is concerned about her feet.  Review of Systems  All other systems reviewed and are negative.      Objective:   Physical Exam        Assessment & Plan:

## 2017-01-21 NOTE — Patient Instructions (Signed)

## 2017-01-22 NOTE — Progress Notes (Signed)
Subjective:     Patient ID: Sheila [name], female   DOB: Jan 02, 1951, 66 y.o.   MRN: [id number]  HPI patient presents with caregiver with significant nail disease bilateral with patient not in good health and not able to articulate well   Review of Systems  All other systems reviewed and are negative.      Objective:   Physical Exam  Constitutional: She is oriented to person, place, and time.  Cardiovascular: Intact distal pulses.   Musculoskeletal: Normal range of motion.  Neurological: She is oriented to person, place, and time.  Skin: Skin is warm and dry.  Nursing note and vitals reviewed.  neurovascular status indicates that patient does have diminishment sharp [employer] vibratory and pulses bilateral but she does have adequate digital perfusion. She has diminished range of motion subtalar midtarsal joint and is essentially nonambulatory and presents with thick yellow brittle nailbeds 1-5 both feet that are bothersome     Assessment:     Poor health individual with mycotic painful nailbeds 1-5 both feet    Plan:     H&P condition reviewed with family and debridement of nailbeds accomplished today understanding that this will be done periodically. Patient family wear of risk factors associated with this leg

## 2017-02-08 ENCOUNTER — Telehealth: Payer: Self-pay | Admitting: Family

## 2017-02-08 NOTE — Telephone Encounter (Signed)
Take extra 5 units of Lantus tonight only.

## 2017-02-08 NOTE — Telephone Encounter (Signed)
Patient has blood sugar of 324. Please advise.  [place]

## 2017-02-08 NOTE — Telephone Encounter (Signed)
Gave message to [facility]ist at the [facility].

## 2017-02-19 ENCOUNTER — Other Ambulatory Visit: Payer: Self-pay | Admitting: Family

## 2017-02-19 DIAGNOSIS — E1142 Type 2 diabetes mellitus with diabetic polyneuropathy: Secondary | ICD-10-CM

## 2017-02-22 ENCOUNTER — Other Ambulatory Visit: Payer: Self-pay

## 2017-02-22 MED ORDER — LORAZEPAM 0.5 MG PO TABS: ORAL_TABLET | ORAL | 0 refills | Status: DC

## 2017-04-07 ENCOUNTER — Telehealth: Payer: Self-pay | Admitting: *Deleted

## 2017-04-07 MED ORDER — LORAZEPAM 0.5 MG PO TABS: ORAL_TABLET | ORAL | 0 refills | Status: DC

## 2017-04-07 NOTE — Telephone Encounter (Signed)
Rec'd fax pt needing refills on her Lorazepam 0.5 mg.../lmb

## 2017-04-07 NOTE — Telephone Encounter (Signed)
Medication refilled to be faxed.  

## 2017-04-13 DIAGNOSIS — Z0289 Encounter for other administrative examinations: Secondary | ICD-10-CM

## 2017-05-05 ENCOUNTER — Emergency Department (HOSPITAL_COMMUNITY): Payer: Medicare Other

## 2017-05-05 ENCOUNTER — Observation Stay (HOSPITAL_COMMUNITY)
Admission: EM | Admit: 2017-05-05 | Discharge: 2017-05-08 | Disposition: A | Discharge status: Home / Self Care | Payer: Medicare Other | Attending: Family Medicine | Admitting: Family Medicine

## 2017-05-05 ENCOUNTER — Encounter (HOSPITAL_COMMUNITY): Payer: Self-pay

## 2017-05-05 DIAGNOSIS — Z8673 Personal history of transient ischemic attack (TIA), and cerebral infarction without residual deficits: Secondary | ICD-10-CM | POA: Insufficient documentation

## 2017-05-05 DIAGNOSIS — R5383 Other fatigue: Secondary | ICD-10-CM | POA: Diagnosis not present

## 2017-05-05 DIAGNOSIS — E11649 Type 2 diabetes mellitus with hypoglycemia without coma: Secondary | ICD-10-CM | POA: Insufficient documentation

## 2017-05-05 DIAGNOSIS — F329 Major depressive disorder, single episode, unspecified: Secondary | ICD-10-CM | POA: Diagnosis present

## 2017-05-05 DIAGNOSIS — Z87891 Personal history of nicotine dependence: Secondary | ICD-10-CM | POA: Diagnosis not present

## 2017-05-05 DIAGNOSIS — Z79899 Other long term (current) drug therapy: Secondary | ICD-10-CM | POA: Insufficient documentation

## 2017-05-05 DIAGNOSIS — E1149 Type 2 diabetes mellitus with other diabetic neurological complication: Secondary | ICD-10-CM | POA: Diagnosis present

## 2017-05-05 DIAGNOSIS — E1140 Type 2 diabetes mellitus with diabetic neuropathy, unspecified: Secondary | ICD-10-CM | POA: Diagnosis not present

## 2017-05-05 DIAGNOSIS — E785 Hyperlipidemia, unspecified: Secondary | ICD-10-CM | POA: Diagnosis present

## 2017-05-05 DIAGNOSIS — R079 Chest pain, unspecified: Secondary | ICD-10-CM | POA: Diagnosis not present

## 2017-05-05 DIAGNOSIS — R402441 Other coma, without documented Glasgow coma scale score, or with partial score reported, in the field [EMT or ambulance]: Secondary | ICD-10-CM | POA: Diagnosis not present

## 2017-05-05 DIAGNOSIS — E162 Hypoglycemia, unspecified: Secondary | ICD-10-CM | POA: Diagnosis present

## 2017-05-05 DIAGNOSIS — Z794 Long term (current) use of insulin: Secondary | ICD-10-CM | POA: Diagnosis not present

## 2017-05-05 DIAGNOSIS — F32A Depression, unspecified: Secondary | ICD-10-CM | POA: Diagnosis present

## 2017-05-05 LAB — CBC WITH DIFFERENTIAL/PLATELET
BASOS ABS: 0 10*3/uL (ref 0.0–0.1)
BASOS PCT: 0 %
Eosinophils Absolute: 0.2 10*3/uL (ref 0.0–0.7)
Eosinophils Relative: 2 %
HCT: 38.8 % (ref 36.0–46.0)
Hemoglobin: 13.4 g/dL (ref 12.0–15.0)
Lymphocytes Relative: 21 %
Lymphs Abs: 1.5 10*3/uL (ref 0.7–4.0)
MCH: 30.2 pg (ref 26.0–34.0)
MCHC: 34.5 g/dL (ref 30.0–36.0)
MCV: 87.6 fL (ref 78.0–100.0)
MONO ABS: 0.4 10*3/uL (ref 0.1–1.0)
MONOS PCT: 5 %
Neutro Abs: 5.1 10*3/uL (ref 1.7–7.7)
Neutrophils Relative %: 72 %
Platelets: 239 10*3/uL (ref 150–400)
RBC: 4.43 MIL/uL (ref 3.87–5.11)
RDW: 13.5 % (ref 11.5–15.5)
WBC: 7.1 10*3/uL (ref 4.0–10.5)

## 2017-05-05 LAB — CBG MONITORING, ED
GLUCOSE-CAPILLARY: 82 mg/dL (ref 65–99)
Glucose-Capillary: 77 mg/dL (ref 65–99)
Glucose-Capillary: 170 mg/dL — ABNORMAL HIGH (ref 65–99)

## 2017-05-05 LAB — COMPREHENSIVE METABOLIC PANEL
ALBUMIN: 3.9 g/dL (ref 3.5–5.0)
ALT: 28 U/L (ref 14–54)
ANION GAP: 11 (ref 5–15)
AST: 37 U/L (ref 15–41)
Alkaline Phosphatase: 123 U/L (ref 38–126)
BUN: 14 mg/dL (ref 6–20)
CALCIUM: 9.2 mg/dL (ref 8.9–10.3)
CHLORIDE: 104 mmol/L (ref 101–111)
CO2: 24 mmol/L (ref 22–32)
Creatinine, Ser: 1.26 mg/dL — ABNORMAL HIGH (ref 0.44–1.00)
GFR calc Af Amer: 50 mL/min — ABNORMAL LOW (ref >60)
GFR calc non Af Amer: 43 mL/min — ABNORMAL LOW (ref >60)
GLUCOSE: 89 mg/dL (ref 65–99)
POTASSIUM: 4.1 mmol/L (ref 3.5–5.1)
SODIUM: 139 mmol/L (ref 135–145)
Total Bilirubin: 0.6 mg/dL (ref 0.3–1.2)
Total Protein: 7.2 g/dL (ref 6.5–8.1)

## 2017-05-05 LAB — GLUCOSE, CAPILLARY
GLUCOSE-CAPILLARY: 184 mg/dL — AB (ref 65–99)
Glucose-Capillary: 184 mg/dL — ABNORMAL HIGH (ref 65–99)
Glucose-Capillary: 176 mg/dL — ABNORMAL HIGH (ref 65–99)

## 2017-05-05 MED ORDER — ACETAMINOPHEN 325 MG PO TABS
650.0000 mg | ORAL_TABLET | Freq: Four times a day (QID) | ORAL | Status: DC | PRN
  Administered 2017-05-06 – 2017-05-07 (×2): 650 mg via ORAL
  Filled 2017-05-05 (×2): qty 2

## 2017-05-05 MED ORDER — CLOPIDOGREL BISULFATE 75 MG PO TABS
75.0000 mg | ORAL_TABLET | Freq: Every day | ORAL | Status: DC
  Administered 2017-05-06 – 2017-05-08 (×3): 75 mg via ORAL
  Filled 2017-05-05 (×3): qty 1

## 2017-05-05 MED ORDER — SERTRALINE HCL 100 MG PO TABS
100.0000 mg | ORAL_TABLET | Freq: Every day | ORAL | Status: DC
  Administered 2017-05-06 – 2017-05-08 (×3): 100 mg via ORAL
  Filled 2017-05-05 (×3): qty 1

## 2017-05-05 MED ORDER — ENOXAPARIN SODIUM 40 MG/0.4ML ~~LOC~~ SOLN
40.0000 mg | SUBCUTANEOUS | Status: DC
  Administered 2017-05-05 – 2017-05-07 (×3): 40 mg via SUBCUTANEOUS
  Filled 2017-05-05 (×3): qty 0.4

## 2017-05-05 MED ORDER — MELOXICAM 7.5 MG PO TABS
15.0000 mg | ORAL_TABLET | Freq: Every day | ORAL | Status: DC
  Administered 2017-05-06 – 2017-05-08 (×3): 15 mg via ORAL
  Filled 2017-05-05 (×3): qty 2

## 2017-05-05 MED ORDER — DEXTROSE 10 % IV SOLN
INTRAVENOUS | Status: DC
  Administered 2017-05-05 – 2017-05-06 (×2): via INTRAVENOUS

## 2017-05-05 MED ORDER — ONDANSETRON HCL 4 MG/2ML IJ SOLN: 4.0000 mg | Freq: Four times a day (QID) | INTRAMUSCULAR | Status: DC | PRN

## 2017-05-05 MED ORDER — ONDANSETRON HCL 4 MG PO TABS: 4.0000 mg | ORAL_TABLET | Freq: Four times a day (QID) | ORAL | Status: DC | PRN

## 2017-05-05 MED ORDER — ACETAMINOPHEN 650 MG RE SUPP: 650.0000 mg | Freq: Four times a day (QID) | RECTAL | Status: DC | PRN

## 2017-05-05 MED ORDER — ATORVASTATIN CALCIUM 40 MG PO TABS
40.0000 mg | ORAL_TABLET | Freq: Every day | ORAL | Status: DC
  Administered 2017-05-06 – 2017-05-07 (×2): 40 mg via ORAL
  Filled 2017-05-05 (×2): qty 1

## 2017-05-05 NOTE — ED Triage Notes (Signed)
[facility] with increased [facility]argy today. Pt CBG was checked at facility and was 50, pt given oral glucose and it went up to 90.

## 2017-05-05 NOTE — ED Provider Notes (Signed)
[facility] DEPT Provider Note   CSN: [id number] Arrival date & time: 05/05/17  1256     History   Chief Complaint Chief Complaint  Patient presents with  . Fatigue    HPI Sheila [name] is a 66 y.o. female.  HPI Patient with history of dementia and diabetes presents from nursing facility today for lethargy. Patient's blood sugar was in the 50s. She was given oral glucose with improvement. Patient is unable to contribute to history due to baseline dementia and confusion. Level V caveat applies. Past Medical History:  Diagnosis Date  . Arthritis   . Diabetes mellitus    complicated by peripheral neuropathy  . Hyperlipidemia   . Stroke [facility]) 2015   residual right foot drop    Patient Active Problem List   Diagnosis Date Noted  . Nail abnormalities 12/31/2016  . Memory changes 09/16/2016  . Multiple falls 09/16/2016  . UTI (urinary tract infection) 02/28/2016  . Depression 02/28/2016  . Acute ischemic VBA thalamic stroke ([place])   . Stroke (cerebrum) ([place])   . Anomia 02/17/2016  . Stroke ([place]) 02/17/2016  . Peripheral arterial disease ([place]) 12/03/2015  . Neuropathy 09/05/2015  . Medicare annual wellness visit, subsequent 03/12/2015  . Routine general medical examination at a health care facility 10/08/2014  . Cerumen impaction 10/08/2014  . Low back pain potentially associated with radiculopathy 03/01/2013  . Hyperlipidemia 02/15/2012  . Type 2 diabetes mellitus with neurologic complication ([place]) 11/94/1740    Past Surgical History:  Procedure Laterality Date  . CHOLECYSTECTOMY    . TONSILLECTOMY      OB History    No data available       Home Medications    Prior to Admission medications   Medication Sig Start Date End Date Taking? Authorizing Provider  atorvastatin (LIPITOR) 40 MG tablet Take 1 tablet (40 mg total) by mouth daily at 6 PM. 02/20/16   Gherghe, Vella Redhead, MD  cephALEXin (KEFLEX) 500 MG capsule Take 1 capsule (500 mg total) by mouth 4  (four) times daily. 12/31/16   Golden Circle, FNP  clopidogrel (PLAVIX) 75 MG tablet Take 1 tablet (75 mg total) by mouth daily. 02/20/16   Caren Griffins, MD  fish oil-omega-3 fatty acids 1000 MG capsule Take 1 g by mouth daily.    [provider]  gabapentin (NEURONTIN) 300 MG capsule Take 1 capsule by mouth at  bedtime Patient taking differently: Take 300mg  capsule by mouth at  bedtime 12/16/15   Golden Circle, FNP  glipiZIDE (GLUCOTROL) 10 MG tablet Take 1 tablet by mouth  daily before breakfast 08/28/15   Golden Circle, FNP  Insulin Glargine (LANTUS SOLOSTAR) 100 UNIT/ML Solostar Pen Inject 40 Units into the skin daily. Patient taking differently: Inject 40 Units into the skin at bedtime.  10/14/16   Golden Circle, FNP  LORazepam (ATIVAN) 0.5 MG tablet TAKE ONE TABLET BY MOUTH TWICE DAILY AS NEEDED FOR ANXIETY 04/07/17   Golden Circle, FNP  meloxicam (MOBIC) 15 MG tablet Take 15 mg by mouth daily.    [provider]  metFORMIN (GLUCOPHAGE XR) 500 MG 24 hr tablet Take 2 tablets (1,000 mg total) by mouth daily with breakfast. 09/16/16   Golden Circle, FNP  Multiple Vitamin (MULTIVITAMIN) tablet Take 1 tablet by mouth daily.    [provider]  sertraline (ZOLOFT) 100 MG tablet Take 1 tablet by mouth  daily Patient taking differently: Take 100mg  tablet by mouth  daily  01/08/16   Golden Circle, FNP  TRULICITY 4.09 WJ/1.9JY SOPN INJECT 0.75 MG INTO THE SKIN SUBCUTANEOUSLY ONCE WEEKLY 02/21/17   Golden Circle, FNP    Family History Family History  Problem Relation Age of Onset  . Diabetes Mother        Deceased  . Diabetes Brother   . Healthy Son     Social History Social History  Substance Use Topics  . Smoking status: Former Smoker    Types: Cigarettes    Quit date: 02/15/1992  . Smokeless tobacco: Never Used  . Alcohol use No     Comment: Socially     Allergies   Sulfa antibiotics and Paxil [paroxetine hcl]   Review of  Systems Review of Systems  Unable to perform ROS: Dementia     Physical Exam Updated Vital Signs BP (!) 142/75   Pulse 80   Temp 98.1 F (36.7 C) (Oral)   Resp 17   SpO2 96%   Physical Exam  Constitutional: She appears well-developed and well-nourished. No distress.  HENT:  Head: Normocephalic and atraumatic.  Mouth/Throat: Oropharynx is clear and moist. No oropharyngeal exudate.  Eyes: EOM are normal. Pupils are equal, round, and reactive to light.  Neck: Normal range of motion. Neck supple.  No meningismus  Cardiovascular: Normal rate and regular rhythm.  Exam reveals no gallop and no friction rub.   No murmur heard. Pulmonary/Chest: Effort normal and breath sounds normal. No respiratory distress. She has no wheezes. She has no rales. She exhibits no tenderness.  Abdominal: Soft. Bowel sounds are normal. There is no tenderness. There is no rebound and no guarding.  Musculoskeletal: Normal range of motion. She exhibits no edema or tenderness.  Neurological: She is alert.  Oriented to person. 5/5 motor bilateral grip strength. Patient able to move bilateral lower extremities though has notable weakness. Per nursing record she is wheelchair-bound due to previous stroke. Sensation is grossly intact.  Skin: Skin is warm and dry. Capillary refill takes less than 2 seconds. No rash noted. No erythema.  Psychiatric: Her behavior is normal.  Flat affect  Nursing note and vitals reviewed.    ED Treatments / Results  Labs (all labs ordered are listed, but only abnormal results are displayed) Labs Reviewed  COMPREHENSIVE METABOLIC PANEL - Abnormal; Notable for the following:       Result Value   Creatinine, Ser 1.26 (*)    GFR calc non Af Amer 43 (*)    GFR calc Af Amer 50 (*)    All other components within normal limits  CBC WITH DIFFERENTIAL/PLATELET  URINALYSIS, ROUTINE W REFLEX MICROSCOPIC  CBG MONITORING, ED  CBG MONITORING, ED    EKG  EKG Interpretation None        Radiology No results found.  Procedures Procedures (including critical care time)  Medications Ordered in ED Medications  dextrose 10 % infusion ( Intravenous New Bag/Given 05/05/17 1429)     Initial Impression / Assessment and Plan / ED Course  I have reviewed the triage vital signs and the nursing notes.  Pertinent labs & imaging results that were available during my care of the patient were reviewed by me and considered in my medical decision making (see chart for details).   patient with trending hypoglycemia despite oral glucose. Placed on D10 drip. Vital signs remained stable. Awaiting UA. Discussed with hospitalist and will admit for observation.    Final Clinical Impressions(s) / ED Diagnoses   Final diagnoses:  Hypoglycemia  Lethargy    New Prescriptions New Prescriptions   No medications on file     Julianne Rice, MD 05/05/17 1545

## 2017-05-05 NOTE — ED Notes (Signed)
Pt aware of need for urine. Pt soiled the bed and will try to college again soon.

## 2017-05-05 NOTE — Progress Notes (Signed)
Patient is Alert and oriented x1, admission and safety contract can not be completed at this time. Call bell within reach and bed alarm activated.

## 2017-05-05 NOTE — H&P (Signed)
[facility] History and Physical  Sheila [name] EPP:[id number] DOB: Oct 02, 1951 DOA: 05/05/2017  Referring physician:  PCP: Golden Circle, FNP   Chief Complaint: "I don't know what happened."  HPI: Sheila [name] is a 66 y.o. female  past history of diabetes, hyperlipidemia, stroke, arthritis who presented to the hospital but chief complaint altered metal status. Patient found to be hypoxemic nursing facility with R2 blood sugar in the 50s. Patient a better reduce at the facility. Blood sugar then came down again. Patient required sugar to be given on multiple occasions per the EDP to stay alert and keep her blood glucose at a normal level.   Per report no recent change her insulin regimen.  Limited history due to acute encephalopathy.  Review of Systems:  As per HPI otherwise 10 point review of systems negative.    Past Medical History:  Diagnosis Date  . Arthritis   . Diabetes mellitus    complicated by peripheral neuropathy  . Hyperlipidemia   . Stroke [facility]) 2015   residual right foot drop   Past Surgical History:  Procedure Laterality Date  . CHOLECYSTECTOMY    . TONSILLECTOMY     Social History:  reports that she quit smoking about 25 years ago. Her smoking use included Cigarettes. She has never used smokeless tobacco. She reports that she does not drink alcohol or use drugs.  Allergies  Allergen Reactions  . Sulfa Antibiotics Other (See Comments)    As noted on MAR  . Paxil [Paroxetine Hcl] Other (See Comments)    Dreams/ nightmares    Family History  Problem Relation Age of Onset  . Diabetes Mother        Deceased  . Diabetes Brother   . Healthy Son      Prior to Admission medications   Medication Sig Start Date End Date Taking? Authorizing Provider  atorvastatin (LIPITOR) 40 MG tablet Take 1 tablet (40 mg total) by mouth daily at 6 PM. 02/20/16  Yes Gherghe, Vella Redhead, MD  clopidogrel (PLAVIX) 75 MG tablet Take 1 tablet (75 mg total) by  mouth daily. 02/20/16  Yes Gherghe, Vella Redhead, MD  fish oil-omega-3 fatty acids 1000 MG capsule Take 1 g by mouth daily.   Yes [provider]  gabapentin (NEURONTIN) 300 MG capsule Take 1 capsule by mouth at  bedtime Patient taking differently: Take 300mg  capsule by mouth at  bedtime 12/16/15  Yes Golden Circle, FNP  glipiZIDE (GLUCOTROL) 10 MG tablet Take 1 tablet by mouth  daily before breakfast 08/28/15  Yes Golden Circle, FNP  Insulin Glargine (LANTUS SOLOSTAR) 100 UNIT/ML Solostar Pen Inject 40 Units into the skin daily. Patient taking differently: Inject 40 Units into the skin at bedtime.  10/14/16  Yes Golden Circle, FNP  LORazepam (ATIVAN) 0.5 MG tablet TAKE ONE TABLET BY MOUTH TWICE DAILY AS NEEDED FOR ANXIETY 04/07/17  Yes Golden Circle, FNP  meloxicam (MOBIC) 15 MG tablet Take 15 mg by mouth daily.   Yes [provider]  metformin (FORTAMET) 1000 MG (OSM) 24 hr tablet Take 1,000 mg by mouth every morning.   Yes [provider]  Multiple Vitamin (DAILY-VITE) TABS Take 1 tablet by mouth every morning.   Yes [provider]  sertraline (ZOLOFT) 100 MG tablet Take 1 tablet by mouth  daily Patient taking differently: Take 100 mg by mouth once a day 01/08/16  Yes Calone, Ples Specter, FNP  TRULICITY 6.06 TK/1.6WF [facility] INJECT  0.75 MG INTO THE SKIN SUBCUTANEOUSLY ONCE WEEKLY 02/21/17  Yes Golden Circle, FNP  cephALEXin (KEFLEX) 500 MG capsule Take 1 capsule (500 mg total) by mouth 4 (four) times daily. Patient not taking: Reported on 05/05/2017 12/31/16   Golden Circle, FNP  metFORMIN (GLUCOPHAGE XR) 500 MG 24 hr tablet Take 2 tablets (1,000 mg total) by mouth daily with breakfast. Patient not taking: Reported on 05/05/2017 09/16/16   Golden Circle, FNP   Physical Exam: Vitals:   05/05/17 1430 05/05/17 1515 05/05/17 1600 05/05/17 1714  BP: 136/77 (!) 142/75 (!) 145/74 132/62  Pulse: 79 80 79 73  Resp: (!) 22 17 17 18   Temp:    97.6 F  (36.4 C)  TempSrc:    Oral  SpO2: 98% 96% 98% 99%  Weight:    86.3 kg (190 lb 3.2 oz)  Height:    5\' 2"  (1.575 m)    Wt Readings from Last 3 Encounters:  05/05/17 86.3 kg (190 lb 3.2 oz)  11/24/16 83.9 kg (185 lb)  09/16/16 83.9 kg (185 lb)    General:  Appears calm and comfortable, Alert and oriented 2, during conversation has trouble following discussion Eyes:  PERRL, EOMI, normal lids, iris ENT:  grossly normal hearing, lips & tongue Neck:  no LAD, masses or thyromegaly Cardiovascular:  RRR, no m/r/g. No LE edema.  Respiratory:  CTA bilaterally, no w/r/r. Normal respiratory effort. Abdomen:  soft, ntnd Skin:  no rash or induration seen on limited exam Musculoskeletal:  grossly normal tone BUE/BLE Psychiatric:  grossly normal mood and affect, speech fluent and appropriate Neurologic:  CN 2-12 grossly intact, moves all extremities in coordinated fashion.          Labs on Admission:  Basic Metabolic Panel:  Recent Labs Lab 05/05/17 1331  NA 139  K 4.1  CL 104  CO2 24  GLUCOSE 89  BUN 14  CREATININE 1.26*  CALCIUM 9.2   Liver Function Tests:  Recent Labs Lab 05/05/17 1331  AST 37  ALT 28  ALKPHOS 123  BILITOT 0.6  PROT 7.2  ALBUMIN 3.9   No results for input(s): LIPASE, AMYLASE in the last 168 hours. No results for input(s): AMMONIA in the last 168 hours. CBC:  Recent Labs Lab 05/05/17 1331  WBC 7.1  NEUTROABS 5.1  HGB 13.4  HCT 38.8  MCV 87.6  PLT 239   Cardiac Enzymes: No results for input(s): CKTOTAL, CKMB, CKMBINDEX, TROPONINI in the last 168 hours.  BNP (last 3 results) No results for input(s): BNP in the last 8760 hours.  ProBNP (last 3 results) No results for input(s): PROBNP in the last 8760 hours.   Serum creatinine: 1.26 mg/dL (H) 05/05/17 1331 Estimated creatinine clearance: 44.2 mL/min (A)  CBG:  Recent Labs Lab 05/05/17 1304 05/05/17 1408 05/05/17 1547 05/05/17 1707  GLUCAP 82 77 170* 184*    Radiological Exams  on Admission: Dg Chest Port 1 View  Result Date: 05/05/2017 CLINICAL DATA:  Chest pain, lethargy, confusion. History of previous CVA, diabetes, hypertension, former smoker. EXAM: PORTABLE CHEST 1 VIEW COMPARISON:  PA and lateral chest x-ray of September 08, 2016 FINDINGS: The lungs are adequately inflated. There is linear increased density lateral to the cardiac apex consistent with subsegmental atelectasis. Otherwise the lungs are clear. The heart and pulmonary vascularity are normal. The mediastinum is normal in width. The trachea is midline. There is calcification in the wall of the aortic arch. IMPRESSION: Minimal subsegmental atelectasis at the left  lung base. No pneumonia, pulmonary edema, nor other acute cardiopulmonary abnormality. Electronically Signed   By: David  Martinique M.D.   On: 05/05/2017 16:34    EKG: Independently reviewed. EKG no stemi.  Assessment/Plan Principal Problem:   Lethargic Active Problems:   Hyperlipidemia   Type 2 diabetes mellitus with neurologic complication (HCC)   Depression   Low blood sugar  Lethargy UA pending CXR - w/o acute findings Likely due to low BS Cont D10 Sch glucose checks Hypoglycemia standing orders  Diabetes Hold sliding scale insulin for now, holding Lantus and glipizide  Hyperlipidemia Hold Lipitor  CAD Continue Plavix  Chronic pain Continue Mobic Hold gabapentin  Anxiety Holding Ativan No SI/HI Continue Zoloft   Code Status: FULL DVT Prophylaxis: Lovenox Family Communication: no family Disposition Plan: Pending Improvement  Status: obs medsurg  Elwin Mocha, MD Family Medicine [facility] [email or web address] Password [email or web address]

## 2017-05-06 DIAGNOSIS — R5383 Other fatigue: Secondary | ICD-10-CM | POA: Diagnosis not present

## 2017-05-06 LAB — URINALYSIS, ROUTINE W REFLEX MICROSCOPIC
BILIRUBIN URINE: NEGATIVE
Glucose, UA: 50 mg/dL — AB
HGB URINE DIPSTICK: NEGATIVE
Ketones, ur: NEGATIVE mg/dL
Leukocytes, UA: NEGATIVE
NITRITE: NEGATIVE
PH: 6 (ref 5.0–8.0)
Protein, ur: NEGATIVE mg/dL
SPECIFIC GRAVITY, URINE: 1.004 — AB (ref 1.005–1.030)

## 2017-05-06 LAB — CBC
HEMATOCRIT: 32.8 % — AB (ref 36.0–46.0)
HEMOGLOBIN: 11.6 g/dL — AB (ref 12.0–15.0)
MCH: 30.1 pg (ref 26.0–34.0)
MCHC: 35.4 g/dL (ref 30.0–36.0)
MCV: 85.2 fL (ref 78.0–100.0)
Platelets: 216 10*3/uL (ref 150–400)
RBC: 3.85 MIL/uL — AB (ref 3.87–5.11)
RDW: 12.9 % (ref 11.5–15.5)
WBC: 5.3 10*3/uL (ref 4.0–10.5)

## 2017-05-06 LAB — BASIC METABOLIC PANEL
ANION GAP: 11 (ref 5–15)
BUN: 9 mg/dL (ref 6–20)
CO2: 23 mmol/L (ref 22–32)
Calcium: 8.9 mg/dL (ref 8.9–10.3)
Chloride: 106 mmol/L (ref 101–111)
Creatinine, Ser: 0.98 mg/dL (ref 0.44–1.00)
GFR calc Af Amer: >60 mL/min (ref >60)
GFR, EST NON AFRICAN AMERICAN: 58 mL/min — AB (ref >60)
Glucose, Bld: 131 mg/dL — ABNORMAL HIGH (ref 65–99)
POTASSIUM: 3.7 mmol/L (ref 3.5–5.1)
SODIUM: 140 mmol/L (ref 135–145)

## 2017-05-06 LAB — GLUCOSE, CAPILLARY
GLUCOSE-CAPILLARY: 122 mg/dL — AB (ref 65–99)
GLUCOSE-CAPILLARY: 210 mg/dL — AB (ref 65–99)
GLUCOSE-CAPILLARY: 122 mg/dL — AB (ref 65–99)
Glucose-Capillary: 150 mg/dL — ABNORMAL HIGH (ref 65–99)
Glucose-Capillary: 177 mg/dL — ABNORMAL HIGH (ref 65–99)
Glucose-Capillary: 120 mg/dL — ABNORMAL HIGH (ref 65–99)

## 2017-05-06 LAB — MRSA PCR SCREENING: MRSA BY PCR: NEGATIVE

## 2017-05-06 NOTE — Clinical Social Work Note (Signed)
CSW contacted facility and talked with Dedra Skeens, Development worker, international aid of [facility] regarding patient's readiness for discharge. CSW advised that the patient will have to be evaluated by their nurse, however she is out until Monday, and can come to hospital on Monday morning to evaluate patient. Son Katina Dung [phone]) contacted and informed. Patient's bedside nurse informed MD of reason for discharge delay.  CSW will continue to follow and assist with discharge back to [name] on Monday.  Eldridge Marcott Givens, MSW, LCSW Licensed Clinical Social Worker [place] [phone]

## 2017-05-06 NOTE — Discharge Summary (Addendum)
Physician Discharge Summary  Sheila [name] OAC:[id number] DOB: July 15, 1951 DOA: 05/05/2017  PCP: Golden Circle, FNP  Admit date: 05/05/2017 Discharge date: 05/06/2017  Time spent: > 35 minutes  Recommendations for Outpatient Follow-up:  1. Monitor blood sugars atleast 4 times per day for the next 2-3 days. Control diabetes with diet. If blood sugars > 200 call primary care physician to adjust hypoglycemic agents 2. Blood sugars crept up again as such will d/c on low dose metformin.    Discharge Diagnoses:  Principal Problem:   Lethargic Active Problems:   Hyperlipidemia   Type 2 diabetes mellitus with neurologic complication (HCC)   Depression   Low blood sugar   Discharge Condition: stable  Diet recommendation: diabetic diet  Filed Weights   05/05/17 1714 05/05/17 2021  Weight: 86.3 kg (190 lb 3.2 oz) 86.2 kg (190 lb 0.6 oz)    History of present illness:   65 y.o. female  past history of diabetes, hyperlipidemia, stroke, arthritis who presented to the hospital but chief complaint altered metal status and history of low blood sugars at [place]:  AMS - Patient is no longer lethargic. Is actually alert. - Oriented to person which is her baseline per my discussion with nursing - most likely secondary to hypoglycemia  Hypoglycemia - recommend controlling blood sugars with a diabetic diet and metformin on d/c. With close observation of blood sugars. - Monitor blood sugars well and if Blood sugars persistently above 200 despite a diabetic diet and metformin contacting primary care physician to adjust hypoglycemic agents.  For other known medical conditions listed above continue home medication regimen prior to admission   Procedures:  None  Consultations:  None  Discharge Exam: Vitals:   05/05/17 2021 05/06/17 0443  BP: 129/68 138/62  Pulse: 71 74  Resp: 18 18  Temp: 97.6 F (36.4 C) 98.3 F (36.8 C)    General: Pt in nad,  alert and awake Cardiovascular: rrr, no rubs Respiratory: no increased wob, no wheezes  Discharge Instructions   Discharge Instructions    Call MD for:  redness, tenderness, or signs of infection (pain, swelling, redness, odor or green/yellow discharge around incision site)    Complete by:  As directed    Call MD for:  severe uncontrolled pain    Complete by:  As directed    Call MD for:  temperature >100.4    Complete by:  As directed    Diet - low sodium heart healthy    Complete by:  As directed    Discharge instructions    Complete by:  As directed    Recommend discharging off hypoglycemic agents. Would treat diabetes with diet control at this point. But recommend monitoring her sugars well in the next several days. And if blood sugars persistently above 200 despite diabetic diet would have PCP consider adding hypoglycemic agents 1 at a time   Increase activity slowly    Complete by:  As directed      Current Discharge Medication List    CONTINUE these medications which have NOT CHANGED   Details  atorvastatin (LIPITOR) 40 MG tablet Take 1 tablet (40 mg total) by mouth daily at 6 PM.    clopidogrel (PLAVIX) 75 MG tablet Take 1 tablet (75 mg total) by mouth daily. Qty: 30 tablet    fish oil-omega-3 fatty acids 1000 MG capsule Take 1 g by mouth daily.    gabapentin (NEURONTIN) 300 MG capsule Take 1 capsule by  mouth at  bedtime Qty: 30 capsule, Refills: 0    LORazepam (ATIVAN) 0.5 MG tablet TAKE ONE TABLET BY MOUTH TWICE DAILY AS NEEDED FOR ANXIETY Qty: 30 tablet, Refills: 0    meloxicam (MOBIC) 15 MG tablet Take 15 mg by mouth daily.    Multiple Vitamin (DAILY-VITE) TABS Take 1 tablet by mouth every morning.    sertraline (ZOLOFT) 100 MG tablet Take 1 tablet by mouth  daily Qty: 90 tablet, Refills: 1      STOP taking these medications     glipiZIDE (GLUCOTROL) 10 MG tablet      Insulin Glargine (LANTUS SOLOSTAR) 100 UNIT/ML Solostar Pen      metformin  (FORTAMET) 1000 MG (OSM) 24 hr tablet      TRULICITY 0.32 ZY/2.4MG SOPN      cephALEXin (KEFLEX) 500 MG capsule      metFORMIN (GLUCOPHAGE XR) 500 MG 24 hr tablet        Allergies  Allergen Reactions  . Sulfa Antibiotics Other (See Comments)    As noted on MAR  . Paxil [Paroxetine Hcl] Other (See Comments)    Dreams/ nightmares      The results of significant diagnostics from this hospitalization (including imaging, microbiology, ancillary and laboratory) are listed below for reference.    Significant Diagnostic Studies: Dg Chest Port 1 View  Result Date: 05/05/2017 CLINICAL DATA:  Chest pain, lethargy, confusion. History of previous CVA, diabetes, hypertension, former smoker. EXAM: PORTABLE CHEST 1 VIEW COMPARISON:  PA and lateral chest x-ray of September 08, 2016 FINDINGS: The lungs are adequately inflated. There is linear increased density lateral to the cardiac apex consistent with subsegmental atelectasis. Otherwise the lungs are clear. The heart and pulmonary vascularity are normal. The mediastinum is normal in width. The trachea is midline. There is calcification in the wall of the aortic arch. IMPRESSION: Minimal subsegmental atelectasis at the left lung base. No pneumonia, pulmonary edema, nor other acute cardiopulmonary abnormality. Electronically Signed   By: David  Martinique M.D.   On: 05/05/2017 16:34    Microbiology: Recent Results (from the past 240 hour(s))  MRSA PCR Screening     Status: None   Collection Time: 05/06/17 10:18 AM  Result Value Ref Range Status   MRSA by PCR NEGATIVE NEGATIVE Final    Comment:        The GeneXpert MRSA Assay (FDA approved for NASAL specimens only), is one component of a comprehensive MRSA colonization surveillance program. It is not intended to diagnose MRSA infection nor to guide or monitor treatment for MRSA infections.      Labs: Basic Metabolic Panel:  Recent Labs Lab 05/05/17 1331 05/06/17 0720  NA 139 140  K  4.1 3.7  CL 104 106  CO2 24 23  GLUCOSE 89 131*  BUN 14 9  CREATININE 1.26* 0.98  CALCIUM 9.2 8.9   Liver Function Tests:  Recent Labs Lab 05/05/17 1331  AST 37  ALT 28  ALKPHOS 123  BILITOT 0.6  PROT 7.2  ALBUMIN 3.9   No results for input(s): LIPASE, AMYLASE in the last 168 hours. No results for input(s): AMMONIA in the last 168 hours. CBC:  Recent Labs Lab 05/05/17 1331 05/06/17 0720  WBC 7.1 5.3  NEUTROABS 5.1  --   HGB 13.4 11.6*  HCT 38.8 32.8*  MCV 87.6 85.2  PLT 239 216   Cardiac Enzymes: No results for input(s): CKTOTAL, CKMB, CKMBINDEX, TROPONINI in the last 168 hours. BNP: BNP (last 3 results) No  results for input(s): BNP in the last 8760 hours.  ProBNP (last 3 results) No results for input(s): PROBNP in the last 8760 hours.  CBG:  Recent Labs Lab 05/05/17 2130 05/06/17 0115 05/06/17 0602 05/06/17 0733 05/06/17 1201  GLUCAP 184* 150* 122* 177* 210*    Signed:  Velvet Bathe MD.  [facility] 05/06/2017, 2:49 PM

## 2017-05-06 NOTE — Clinical Social Work Note (Signed)
Clinical Social Work Assessment  Patient Details  Name: Sheila [name] MRN: [id number] Date of Birth: Jul 27, 1951  Date of referral:  05/06/17               Reason for consult:  Facility Placement                Permission sought to share information with:  Family Supports (Son contacted as patient oriented to self only.) Permission granted to share information::  No  Name::     Katina Dung  Agency::     Relationship::  Son  Sport and exercise psychologist Information:  [phone]  Housing/Transportation Living arrangements for the past 2 months:  [place] ([place] [name]) Source of Information:  Adult Children, Facility Patient Interpreter Needed:  None Criminal Activity/Legal Involvement Pertinent to Current Situation/Hospitalization:  No - Comment as needed Significant Relationships:  Adult Children Lives with:  Facility Resident Do you feel safe going back to the place where you live?  Yes Need for family participation in patient care:  Yes (Comment)  Care giving concerns:  None expressed by son regarding care at [name]   Social Worker assessment / plan:  CSW talked with son regarding discharge plan, patient's readiness for discharge and communication with [name]. Patient medically stable for discharge and per son will return to [name]. Son advised of conversation with Dedra Skeens, Development worker, international aid of [name]. Per Dedra Skeens, patient must be evaluated by nurse before returning to facility and the nurse is out and won't return to work until Monday. Per Dedra Skeens, the nurse can come to hospital Monday morning to evaluate patient. Mr. Oneida Alar contacted facility and was advised the same.   Employment status:  Retired Forensic scientist:  Medicare PT Recommendations:  Not assessed at this time [place] / Referral to community resources:  Other (Comment Required) (None needed or requested as patient from [name])  Patient/Family's Response to care: No concerns expressed by son regarding care during  hospitalization.  Patient/Family's Understanding of and Emotional Response to Diagnosis, Current Treatment, and Prognosis:  Not discussed.  Emotional Assessment Appearance:  Other (Comment Required (Did not visit with patient) Attitude/Demeanor/Rapport:  Unable to Assess Affect (typically observed):  Unable to Assess Orientation:  Oriented to Self Alcohol / Substance use:  Tobacco Use, Alcohol Use, Illicit Drugs (Patient quit smoking and does not drink or use illicit drugs) Psych involvement (Current and /or in the community):  No (Comment)  Discharge Needs  Concerns to be addressed:  Discharge Planning Concerns Readmission within the last 30 days:  No Current discharge risk:  None Barriers to Discharge:  Other ([name] will not accept patient until nurse evaluated her and this will be done on Monday, 5/14.)   Sable Feil, LCSW 05/06/2017, 7:35 PM

## 2017-05-06 NOTE — NC FL2 (Signed)
[place] LEVEL OF CARE SCREENING TOOL     IDENTIFICATION  Patient Name: Sheila [name] Birthdate: Mar 08, 1951 Sex: female Admission Date (Current Location): 05/05/2017  [facility] and [place] Number:  Herbalist and Address:  [facility], [place] [address], [place], [place] [address]      Provider Number: [id number]  Attending Physician Name and Address:  Velvet Bathe, MD  Relative Name and Phone Number:  Zetta Bills - [phone]     Current Level of Care: Hospital Recommended Level of Care: [facility]) Prior Approval Number:    Date Approved/Denied:   PASRR Number:    Discharge Plan: Other (Comment) ([place])    Current Diagnoses: Patient Active Problem List   Diagnosis Date Noted  . Lethargic 05/05/2017  . Low blood sugar 05/05/2017  . Nail abnormalities 12/31/2016  . Memory changes 09/16/2016  . Multiple falls 09/16/2016  . UTI (urinary tract infection) 02/28/2016  . Depression 02/28/2016  . Acute ischemic VBA thalamic stroke ([place])   . Stroke (cerebrum) ([place])   . Anomia 02/17/2016  . Stroke ([place]) 02/17/2016  . Peripheral arterial disease ([place]) 12/03/2015  . Neuropathy 09/05/2015  . Medicare annual wellness visit, subsequent 03/12/2015  . Routine general medical examination at a health care facility 10/08/2014  . Cerumen impaction 10/08/2014  . Low back pain potentially associated with radiculopathy 03/01/2013  . Hyperlipidemia 02/15/2012  . Type 2 diabetes mellitus with neurologic complication ([place]) 10/93/2355    Orientation RESPIRATION BLADDER Height & Weight     Self  Normal Incontinent Weight: 190 lb 0.6 oz (86.2 kg) Height:  5\' 2"  (157.5 cm)  BEHAVIORAL SYMPTOMS/MOOD NEUROLOGICAL BOWEL NUTRITION STATUS      Continent Diet (Low sodium-Heart healthy)  AMBULATORY STATUS COMMUNICATION OF NEEDS Skin   Extensive Assist Verbally Normal                       Personal Care Assistance Level of Assistance  Bathing, Feeding, Dressing Bathing Assistance: Maximum assistance Feeding assistance: Independent (assistance with set-up) Dressing Assistance: Maximum assistance     Functional Limitations Info  Sight, Hearing, Speech Sight Info: Adequate Hearing Info: Adequate Speech Info: Adequate    SPECIAL CARE FACTORS FREQUENCY                       Contractures Contractures Info: Not present    Additional Factors Info  Code Status, Allergies Code Status Info: Full Allergies Info: Sulfa antibiotics and paxil           Current Medications (05/06/2017):  This is the current hospital active medication list Current Facility-Administered Medications  Medication Dose Route Frequency Provider Last Rate Last Dose  . acetaminophen (TYLENOL) tablet 650 mg  650 mg Oral Q6H PRN Elwin Mocha, MD   650 mg at 05/06/17 1217   Or  . acetaminophen (TYLENOL) suppository 650 mg  650 mg Rectal Q6H PRN Elwin Mocha, MD      . atorvastatin (LIPITOR) tablet 40 mg  40 mg Oral q1800 Elwin Mocha, MD      . clopidogrel (PLAVIX) tablet 75 mg  75 mg Oral Daily Elwin Mocha, MD   75 mg at 05/06/17 1006  . enoxaparin (LOVENOX) injection 40 mg  40 mg Subcutaneous Q24H Elwin Mocha, MD   40 mg at 05/06/17 1542  . meloxicam (MOBIC) tablet 15 mg  15  mg Oral Daily Elwin Mocha, MD   15 mg at 05/06/17 1006  . ondansetron (ZOFRAN) tablet 4 mg  4 mg Oral Q6H PRN Elwin Mocha, MD       Or  . ondansetron [facility]) injection 4 mg  4 mg Intravenous Q6H PRN Elwin Mocha, MD      . sertraline (ZOLOFT) tablet 100 mg  100 mg Oral Daily Elwin Mocha, MD   100 mg at 05/06/17 1006     Discharge Medications: Please see discharge summary for a list of discharge medications.  Relevant Imaging Results:  Relevant Lab Results:   Additional Information ss#[id number]. Discharge Medications:   atorvastatin (LIPITOR) 40 MG  tablet Take 1 tablet (40 mg total) by mouth daily at 6 PM.    clopidogrel (PLAVIX) 75 MG tablet Take 1 tablet (75 mg total) by mouth daily. Qty: 30 tablet    fish oil-omega-3 fatty acids 1000 MG capsule Take 1 g by mouth daily.    gabapentin (NEURONTIN) 300 MG capsule Take 1 capsule by mouth at  bedtime Qty: 30 capsule, Refills: 0    LORazepam (ATIVAN) 0.5 MG tablet TAKE ONE TABLET BY MOUTH TWICE DAILY AS NEEDED FOR ANXIETY Qty: 30 tablet, Refills: 0    meloxicam (MOBIC) 15 MG tablet Take 15 mg by mouth daily.    Multiple Vitamin (DAILY-VITE) TABS Take 1 tablet by mouth every morning.    sertraline (ZOLOFT) 100 MG tablet Take 1 tablet by mouth  daily Qty: 90 tablet, Refills: 1         STOP taking these medications     glipiZIDE (GLUCOTROL) 10 MG tablet      Insulin Glargine (LANTUS SOLOSTAR) 100 UNIT/ML Solostar Pen      metformin (FORTAMET) 1000 MG (OSM) 24 hr tablet      TRULICITY 6.46 OE/3.2ZY SOPN      cephALEXin (KEFLEX) 500 MG capsule      metFORMIN (GLUCOPHAGE XR) 500 MG 24 hr tablet          Sharlet Salina Mila Homer, LCSW

## 2017-05-06 NOTE — Care Management Note (Signed)
Case Management Note  Patient Details  Name: Sheila [name] MRN: [id number] Date of Birth: 12-03-1951  Subjective/Objective:      CM   following for progression and d/c planning.             Action/Plan: Met with pt this am, pt unable to discuss OBS status, this CM contacted pt son Astrid Drafts and explained OBS status. 3:45 pm This CM informed by CSW V Crawford that this pt is ready for d/c however pt assisted living facility, [facility] states that they are unable to take this pt back to that facility until their nurse has eval in the hospital and the nurse will not be available until Monday, 05/09/2017. CSW then contacted pt son to inform him of this situation as the pt has d/c orders at this time.   Expected Discharge Date:  05/06/17               Expected Discharge Plan:  Assisted Living / Rest Home  In-House Referral:  Clinical Social Work  Discharge planning Services  NA  Post Acute Care Choice:  NA Choice offered to:  Adult Children  DME Arranged:  N/A DME Agency:  NA  HH Arranged:  NA HH Agency:  NA  Status of Service:  In process, will continue to follow  If discussed at Long Length of Stay Meetings, dates discussed:    Additional Comments:  Adron Bene, RN 05/06/2017, 3:56 PM

## 2017-05-06 NOTE — Care Management Obs Status (Addendum)
MEDICARE OBSERVATION STATUS NOTIFICATION   Patient Details  Name: Sheila [name] MRN: [id number] Date of Birth: [birth year]   Medicare Observation Status Notification Given:  Yes  Pt unable to understand, this CM contacted pt son Katina Dung and discussed OBS status.   Manjinder Breau, Rory Percy, RN 05/06/2017, 10:24 AM

## 2017-05-06 NOTE — Progress Notes (Signed)
According to  CM and SW, patient will not be able to be D/C'd back to [facility] today. MD notified.

## 2017-05-06 NOTE — Progress Notes (Signed)
Patient appears to be anxious. Currently no PRN orders. MD notified. MD stated that he would come see patient. Will continue to monitor.

## 2017-05-06 NOTE — Progress Notes (Signed)
CBG: 210. MD notified. Verbal order to D/C continuous D10. Orders followed. Will continue to monitor.

## 2017-05-06 NOTE — Progress Notes (Signed)
Wendee Beavers, MD requested that [place] be contacted to assess what the patients baseline is. Blondie at [facility] stated that the patients baseline is she is A&O to self, which is her current orientation level. MD notified of this information. Will continue to monitor.

## 2017-05-07 LAB — GLUCOSE, CAPILLARY
GLUCOSE-CAPILLARY: 133 mg/dL — AB (ref 65–99)
GLUCOSE-CAPILLARY: 112 mg/dL — AB (ref 65–99)
GLUCOSE-CAPILLARY: 147 mg/dL — AB (ref 65–99)
GLUCOSE-CAPILLARY: 90 mg/dL (ref 65–99)
Glucose-Capillary: 348 mg/dL — ABNORMAL HIGH (ref 65–99)

## 2017-05-07 MED ORDER — INSULIN ASPART 100 UNIT/ML ~~LOC~~ SOLN
0.0000 [IU] | Freq: Three times a day (TID) | SUBCUTANEOUS | Status: DC
  Administered 2017-05-07: 11 [IU] via SUBCUTANEOUS
  Administered 2017-05-08: 3 [IU] via SUBCUTANEOUS
  Administered 2017-05-08: 2 [IU] via SUBCUTANEOUS

## 2017-05-07 NOTE — Care Management Note (Addendum)
Case Management Note  Patient Details  Name: Sheila [name] MRN: [id number] Date of Birth: 1951-06-29  Subjective/Objective:                  altered metal status Action/Plan: Discharge planning Expected Discharge Date:  05/06/17               Expected Discharge Plan:  Assisted Living / Rest Home  In-House Referral:  Clinical Social Work  Discharge planning Services  NA  Post Acute Care Choice:  NA Choice offered to:  Adult Children  DME Arranged:  N/A DME Agency:  NA  HH Arranged:  NA HH Agency:  NA  Status of Service:  Completed, signed off  If discussed at [place] of Stay Meetings, dates discussed:    Additional Comments: 11:30 CM received callback from [place] and I told Al[facility] the MD has discharged the pt; this CM requested PT/OT consult to explore higher level of care needs and HHPT was recommended; CM spoke with pt's son mark Fields who states the level of assistance at [facility] includes medication assist, meals, laundry, welfare checks, PT and understands pt is discharged.  Alma stands firm in stating [facility] protocol is to assess pt prior to coming back to the [name] and they do not have staffing to assess pt until Monday.  CM has relayed this to CSW, Judson Roch.  No other CM needs were communicated. CM called [facility] to inquire as to why staffing not available for 3 days to assess pt prior to accepting back to [name] (per previous CSW note).  CM is waiting callback. Dellie Catholic, RN 05/07/2017, 11:04 AM

## 2017-05-07 NOTE — Progress Notes (Signed)
CBG: 348. MD notified. Orders placed. Will continue to monitor.

## 2017-05-07 NOTE — Evaluation (Signed)
Physical Therapy Evaluation Patient Details Name: Sheila [name] MRN: [id number] DOB: 20-Feb-1951 Today's Date: 05/07/2017   History of Present Illness  Pt is a 66 yo female admitted through ED on 05/05/17 with AMS related to low glucose in the 50s. PMH significant for DM2, stroke 2015 with memory loss and right sided foot drop, OA.   Clinical Impression  Pt presents with the above diagnosis and below deficits for therapy evaluation. Prior to admission, pt lived in an [name] and was functioning in a manual WC at baseline. Pt is able to perform short distance gait this session within the RW and perform transfers with Min guard from recliner. With continued therapy at [name], pt is anticipated to improve back to short distance gait and mobility with WC throughout her apartment int he [name]. Pt continues to have confusion which family reports is baseline. Pt may benefit from memory care due to current deficits, but is otherwise functioning close to baseline for mobility. All further needs can be met at next venue of care.     Follow Up Recommendations Home health PT;Supervision/Assistance - 24 hour    Equipment Recommendations  None recommended by PT    Recommendations for Other Services       Precautions / Restrictions Precautions Precautions: Fall Restrictions Weight Bearing Restrictions: No      Mobility  Bed Mobility               General bed mobility comments: OOB in recliner when PT arrives  Transfers Overall transfer level: Needs assistance Equipment used: Rolling walker (2 wheeled) Transfers: Sit to/from Stand Sit to Stand: Min guard         General transfer comment: Min guard for safety, good awareness to reach back for chair before sitting  Ambulation/Gait Ambulation/Gait assistance: Min guard;Min assist Ambulation Distance (Feet): 20 Feet Assistive device: Rolling walker (2 wheeled) Gait Pattern/deviations: Step-through pattern;Decreased step length -  right;Decreased step length - left Gait velocity: decreased Gait velocity interpretation: Below normal speed for age/gender General Gait Details: decreased step length bilaterally, cues for awareness. Min A to maneuver RW around obstacles.   Stairs            Wheelchair Mobility    Modified Rankin (Stroke Patients Only)       Balance Overall balance assessment: Needs assistance Sitting-balance support: No upper extremity supported;Feet supported Sitting balance-Leahy Scale: Good     Standing balance support: Bilateral upper extremity supported;During functional activity Standing balance-Leahy Scale: Poor Standing balance comment: requires UE suppose in standing                              Pertinent Vitals/Pain Pain Assessment: No/denies pain    Home Living Family/patient expects to be discharged to:: Assisted living               Home Equipment: Walker - 2 wheels;Bedside commode;Grab bars - toilet;Grab bars - tub/shower;Wheelchair - manual Additional Comments: pt lived in [name] at baseline and used WC to maneuver around facility.     Prior Function Level of Independence: Needs assistance   Gait / Transfers Assistance Needed: used WC at baseline and was able to perform short distance gait with RW  ADL's / Homemaking Assistance Needed: requires assistance as provided by [name]        Hand Dominance   Dominant Hand: Right    Extremity/Trunk Assessment   Upper Extremity Assessment Upper Extremity Assessment: Generalized  weakness    Lower Extremity Assessment Lower Extremity Assessment: Overall WFL for tasks assessed;Generalized weakness    Cervical / Trunk Assessment Cervical / Trunk Assessment: Normal  Communication   Communication: Receptive difficulties  Cognition Arousal/Alertness: Awake/alert Behavior During Therapy: WFL for tasks assessed/performed Overall Cognitive Status: History of cognitive impairments - at baseline Area of  Impairment: Awareness;Orientation                 Orientation Level: Disoriented to;Place;Time         Awareness: Intellectual   General Comments: Has to question family about birthdate, number of family members, etc.       General Comments      Exercises     Assessment/Plan    PT Assessment All further PT needs can be met in the next venue of care  PT Problem List Decreased strength;Decreased activity tolerance;Decreased balance;Decreased mobility;Decreased knowledge of use of DME;Decreased safety awareness;Decreased cognition       PT Treatment Interventions      PT Goals (Current goals can be found in the Care Plan section)  Acute Rehab PT Goals Patient Stated Goal: to feel better PT Goal Formulation: With patient Time For Goal Achievement: 05/14/17 Potential to Achieve Goals: Good    Frequency     Barriers to discharge        Co-evaluation               AM-PAC PT "6 Clicks" Daily Activity  Outcome Measure Difficulty turning over in bed (including adjusting bedclothes, sheets and blankets)?: A Little Difficulty moving from lying on back to sitting on the side of the bed? : A Little Difficulty sitting down on and standing up from a chair with arms (e.g., wheelchair, bedside commode, etc,.)?: A Little Help needed moving to and from a bed to chair (including a wheelchair)?: A Little Help needed walking in hospital room?: A Little Help needed climbing 3-5 steps with a railing? : A Lot 6 Click Score: 17    End of Session Equipment Utilized During Treatment: Gait belt Activity Tolerance: Patient tolerated treatment well Patient left: in chair;with call bell/phone within reach;with chair alarm set;with family/visitor present Nurse Communication: Mobility status PT Visit Diagnosis: Difficulty in walking, not elsewhere classified (R26.2);Muscle weakness (generalized) (M62.81)    Time: 3220-2542 PT Time Calculation (min) (ACUTE ONLY): 15  min   Charges:   PT Evaluation $PT Eval Moderate Complexity: 1 Procedure     PT G Codes:   PT G-Codes **NOT FOR INPATIENT CLASS** Functional Assessment Tool Used: AM-PAC 6 Clicks Basic Mobility;Clinical judgement Functional Limitation: Mobility: Walking and moving around Mobility: Walking and Moving Around Current Status (H0623): At least 40 percent but less than 60 percent impaired, limited or restricted Mobility: Walking and Moving Around Goal Status [phone]): At least 1 percent but less than 20 percent impaired, limited or restricted    Scheryl Marten PT, DPT  [phone]   Shanon Rosser 05/07/2017, 1:58 PM

## 2017-05-07 NOTE — Clinical Social Work Note (Signed)
CSW spoke with Network engineer at [facility] regarding patient returning. She gave message to her supervisor, Lacretia Leigh, who sent a message to the executive director that she needed to call CSW regarding patient returning today. CSW has not heard back yet. CSW called Surveyor, quantity of social work who plans to call the state on Monday if the facility does not take the patient back today. CSW called again to get fax number to send discharge summary and FL2. CSW also made the facility aware of the assistant director's plan to call the state on Monday. CSW was put on hold and the secretary made [facility] aware. She again stated that they do not have clearance from the executive director for her to return today. CSW made patient aware that patient's son has received ABN and the facility will be responsible for the charges as patient is stable for discharge and Medicare will likely not cover her continued stay. CSW noted to secretary RNCM's note that says she talked with the executive director who stated that she could not come back due to not having access to nursing care until Monday. Secretary said "well I guess that's the final word then. We don't have the authority to accept the patient back if she says we cannot do so." CSW updated RNCM and Surveyor, quantity of social work.   Sheila [name], [place]

## 2017-05-07 NOTE — Progress Notes (Signed)
Patient alert. No new complaints. Awaiting discharge. Concern for higher needs as such PT evaluation order placed. Will await further recommendations after assessment.  Reassess next am.  Gen: pt in nad, alert and awake CV: s1 and s2 wnl , no rubs pulm: no wheezes, equal chest rise  Sheila [name]

## 2017-05-08 LAB — GLUCOSE, CAPILLARY
GLUCOSE-CAPILLARY: 146 mg/dL — AB (ref 65–99)
Glucose-Capillary: 156 mg/dL — ABNORMAL HIGH (ref 65–99)

## 2017-05-08 MED ORDER — METFORMIN HCL ER 500 MG PO TB24: 1000.0000 mg | ORAL_TABLET | Freq: Two times a day (BID) | ORAL | Status: DC

## 2017-05-08 NOTE — Progress Notes (Signed)
CSW called [facility] this morning and provided them with CSW Surveyor, quantity phone number to discuss case- Network engineer to Training and development officer with this information and provide number to [name] business manager when they arrive later this morning  CSW will continue to follow and assist as needed.  Jorge Ny, LCSW Clinical Social Worker [phone]

## 2017-05-08 NOTE — NC FL2 (Signed)
[place] LEVEL OF CARE SCREENING TOOL     IDENTIFICATION  Patient Name: Sheila [name] Birthdate: 20-Aug-1951 Sex: female Admission Date (Current Location): 05/05/2017  [facility] [place] Number:  Herbalist and Address:  [facility]. [facility], [place] [address], [place], [place] [address]      Provider Number: [id number]  Attending Physician Name and Address:  Velvet Bathe, MD  Relative Name and Phone Number:  Zetta Bills - [phone]     Current Level of Care: Hospital Recommended Level of Care: [facility]) Prior Approval Number:    Date Approved/Denied:   PASRR Number:    Discharge Plan: Other (Comment) ([place])    Current Diagnoses: Patient Active Problem List   Diagnosis Date Noted  . Lethargic 05/05/2017  . Low blood sugar 05/05/2017  . Nail abnormalities 12/31/2016  . Memory changes 09/16/2016  . Multiple falls 09/16/2016  . UTI (urinary tract infection) 02/28/2016  . Depression 02/28/2016  . Acute ischemic VBA thalamic stroke ([place])   . Stroke (cerebrum) ([place])   . Anomia 02/17/2016  . Stroke ([place]) 02/17/2016  . Peripheral arterial disease ([place]) 12/03/2015  . Neuropathy 09/05/2015  . Medicare annual wellness visit, subsequent 03/12/2015  . Routine general medical examination at a health care facility 10/08/2014  . Cerumen impaction 10/08/2014  . Low back pain potentially associated with radiculopathy 03/01/2013  . Hyperlipidemia 02/15/2012  . Type 2 diabetes mellitus with neurologic complication ([place]) 78/58/8502    Orientation RESPIRATION BLADDER Height & Weight     Self  Normal Incontinent Weight: 191 lb 0.5 oz (86.7 kg) Height:  5\' 2"  (157.5 cm)  BEHAVIORAL SYMPTOMS/MOOD NEUROLOGICAL BOWEL NUTRITION STATUS      Continent Diet (Low sodium-Heart healthy)  AMBULATORY STATUS COMMUNICATION OF NEEDS Skin   Limited Assist Verbally Normal                        Personal Care Assistance Level of Assistance  Bathing, Feeding, Dressing Bathing Assistance: Limited assistance Feeding assistance: Independent (assistance with set-up) Dressing Assistance: Limited assistance     Functional Limitations Info  Sight, Hearing, Speech Sight Info: Adequate Hearing Info: Adequate Speech Info: Adequate    SPECIAL CARE FACTORS FREQUENCY  PT (By licensed PT)     PT Frequency: 2/wk with home health              Contractures Contractures Info: Not present    Additional Factors Info  Code Status, Allergies Code Status Info: Full Allergies Info: Sulfa antibiotics and paxil           Current Medications (05/08/2017):  This is the current hospital active medication list Current Facility-Administered Medications  Medication Dose Route Frequency Provider Last Rate Last Dose  . acetaminophen (TYLENOL) tablet 650 mg  650 mg Oral Q6H PRN Elwin Mocha, MD   650 mg at 05/07/17 1717   Or  . acetaminophen (TYLENOL) suppository 650 mg  650 mg Rectal Q6H PRN Elwin Mocha, MD      . atorvastatin (LIPITOR) tablet 40 mg  40 mg Oral q1800 Elwin Mocha, MD   40 mg at 05/07/17 1717  . clopidogrel (PLAVIX) tablet 75 mg  75 mg Oral Daily Elwin Mocha, MD   75 mg at 05/07/17 7741  . enoxaparin (LOVENOX) injection 40 mg  40 mg Subcutaneous Q24H Elwin Mocha, MD   40 mg at 05/07/17  1524  . insulin aspart (novoLOG) injection 0-15 Units  0-15 Units Subcutaneous TID WC Velvet Bathe, MD   3 Units at 05/08/17 [phone]  . meloxicam (MOBIC) tablet 15 mg  15 mg Oral Daily Elwin Mocha, MD   15 mg at 05/07/17 3338  . ondansetron (ZOFRAN) tablet 4 mg  4 mg Oral Q6H PRN Elwin Mocha, MD       Or  . ondansetron [facility]) injection 4 mg  4 mg Intravenous Q6H PRN Elwin Mocha, MD      . sertraline (ZOLOFT) tablet 100 mg  100 mg Oral Daily Elwin Mocha, MD   100 mg at 05/07/17 3291     Discharge Medications: Please see discharge  summary for a list of discharge medications.  Relevant Imaging Results:  Relevant Lab Results:   Additional Information ss#[id number]. Discharge Medications:   Jorge Ny, LCSW

## 2017-05-08 NOTE — Progress Notes (Signed)
Patient will discharge to [name] Anticipated discharge date: 5/13 Family notified: pt son, Insurance underwriter by [employer] [place]- will come to hospital after he gets out of church at [place] signing off.  Jorge Ny, LCSW Clinical Social Worker [phone]

## 2017-05-08 NOTE — Progress Notes (Signed)
Tammee Illene Regulus to be D/C'd Skilled nursing facility per MD order.  Discussed prescriptions and follow up appointments with the patient. Prescriptions given to patient, medication list explained in detail. Pt verbalized understanding.  Allergies as of 05/08/2017      Reactions   Sulfa Antibiotics Other (See Comments)   As noted on MAR   Paxil [paroxetine Hcl] Other (See Comments)   Dreams/ nightmares      Medication List    STOP taking these medications   cephALEXin 500 MG capsule Commonly known as:  KEFLEX   glipiZIDE 10 MG tablet Commonly known as:  GLUCOTROL   Insulin Glargine 100 UNIT/ML Solostar Pen Commonly known as:  LANTUS SOLOSTAR   TRULICITY 2.91 BT/6.6MA Sopn Generic drug:  Dulaglutide     TAKE these medications   atorvastatin 40 MG tablet Commonly known as:  LIPITOR Take 1 tablet (40 mg total) by mouth daily at 6 PM.   clopidogrel 75 MG tablet Commonly known as:  PLAVIX Take 1 tablet (75 mg total) by mouth daily.   DAILY-VITE Tabs Take 1 tablet by mouth every morning.   fish oil-omega-3 fatty acids 1000 MG capsule Take 1 g by mouth daily.   gabapentin 300 MG capsule Commonly known as:  NEURONTIN Take 1 capsule by mouth at  bedtime What changed:  See the new instructions.   LORazepam 0.5 MG tablet Commonly known as:  ATIVAN TAKE ONE TABLET BY MOUTH TWICE DAILY AS NEEDED FOR ANXIETY   meloxicam 15 MG tablet Commonly known as:  MOBIC Take 15 mg by mouth daily.   metFORMIN 500 MG 24 hr tablet Commonly known as:  GLUCOPHAGE-XR Take 2 tablets (1,000 mg total) by mouth 2 (two) times daily with a meal. What changed:  medication strength  when to take this  Another medication with the same name was removed. Continue taking this medication, and follow the directions you see here.   sertraline 100 MG tablet Commonly known as:  ZOLOFT Take 1 tablet by mouth  daily What changed:  See the new instructions.       Vitals:   05/08/17 0600 05/08/17  1017  BP: 133/63 (!) 112/52  Pulse: 80 78  Resp: 16 16  Temp: 98.6 F (37 C) 98 F (36.7 C)    Skin clean, dry and intact without evidence of skin break down, no evidence of skin tears noted. IV catheter discontinued intact. Site without signs and symptoms of complications. Dressing and pressure applied. Pt denies pain at this time. No complaints noted.  An After Visit Summary was printed and given to the patient. Patient escorted via [place], and D/C home via private auto.  Haywood Lasso BSN, RN [facility] Phone [phone]

## 2017-05-09 ENCOUNTER — Telehealth: Payer: Self-pay | Admitting: Family

## 2017-05-09 ENCOUNTER — Telehealth: Payer: Self-pay | Admitting: Internal Medicine

## 2017-05-09 ENCOUNTER — Telehealth: Payer: Self-pay | Admitting: *Deleted

## 2017-05-09 ENCOUNTER — Inpatient Hospital Stay: Payer: Self-pay | Admitting: Nurse Practitioner

## 2017-05-09 DIAGNOSIS — B999 Unspecified infectious disease: Secondary | ICD-10-CM | POA: Diagnosis not present

## 2017-05-09 DIAGNOSIS — Z452 Encounter for adjustment and management of vascular access device: Secondary | ICD-10-CM | POA: Diagnosis not present

## 2017-05-09 MED ORDER — METFORMIN HCL 500 MG PO TABS: 500.0000 mg | ORAL_TABLET | Freq: Two times a day (BID) | ORAL | 0 refills | Status: DC

## 2017-05-09 NOTE — Telephone Encounter (Signed)
Sheila [name] From [facility]  [phone]  They have a some questions about some instruction when pt was release from hospital.  Questions about meds.

## 2017-05-09 NOTE — Telephone Encounter (Signed)
Nurse at the facility she is at called, CBG tis afternoon was 292. Was d/c from the hospital recently, on no DM meds, plan was to restart metformin if needed. Other CBGs recently: 159, 194. Advise nurse to : Start metformin 500 mg BID, Rx sent Continue monitoring CBGs firther advise per PCP, will forward note to him JP

## 2017-05-09 NOTE — ED Notes (Signed)
Pt was brought from [facility] with c/o picc line that they could not work with , upon inspection pt just had a 22G peripheral IV , that IV was D/c'd , I called [facility] and they said to bring pt back , Dr Ashok Cordia  Looked at pt has stated that pt was [facility] to go back to the assisted living , also called son Katina Dung and explained to him the situation , He was also fine with her  going back to assistant living. [facility] transported pt back assistant living

## 2017-05-09 NOTE — Telephone Encounter (Signed)
Transition Care Management Follow-up Telephone Call   Date discharged? 05/06/17   How have you been since you were released from the hospital? Pt states she is doing ok   Do you understand why you were in the hospital? YES   Do you understand the discharge instructions? YES   Where were you discharged to? HOME (nursing home)   Items Reviewed:  Medications reviewed: YES  Allergies reviewed: YES  Dietary changes reviewed: Diabetic diet  Referrals reviewed: No referral needed   Functional Questionnaire:   Activities of Daily Living (ADLs):   She states she are independent in the following: ambulation, bathing and hygiene, feeding, continence, grooming, toileting and dressing States she  require assistance with the following: ambulation   Any transportation issues/concerns?: NO   Any patient concerns? NO   Confirmed importance and date/time of follow-up visits scheduled She stated they reschedule her appt for tomorrow 05/10/17  Provider Appointment booked with Terri Piedra  Confirmed with patient if condition begins to worsen call PCP or go to the ER.  Patient was given the office number and encouraged to call back with question or concerns.  : YES

## 2017-05-09 NOTE — Telephone Encounter (Signed)
Spoke with [employer] at [facility]. She stated that the hospital d/c'd all diabetic medication and they were told to check her BS 4 times a day and if it gets over 200 then to call us and let us know. States that at lunch time her sugar was 192. Concerned about what to do after hours if her sugars are over 200s tonight. Please advise

## 2017-05-09 NOTE — Telephone Encounter (Signed)
Please have her start the Trulicity. We will continue to monitor her blood sugars and make adjustments accordingly.

## 2017-05-09 NOTE — Telephone Encounter (Signed)
Called back and let Blondie know response below. Faxing over info as well per [facility] rules.

## 2017-05-10 ENCOUNTER — Ambulatory Visit (INDEPENDENT_AMBULATORY_CARE_PROVIDER_SITE_OTHER): Payer: Medicare Other | Admitting: Family

## 2017-05-10 ENCOUNTER — Encounter: Payer: Self-pay | Admitting: Family

## 2017-05-10 DIAGNOSIS — Z794 Long term (current) use of insulin: Secondary | ICD-10-CM

## 2017-05-10 DIAGNOSIS — E1142 Type 2 diabetes mellitus with diabetic polyneuropathy: Secondary | ICD-10-CM

## 2017-05-10 DIAGNOSIS — E1149 Type 2 diabetes mellitus with other diabetic neurological complication: Secondary | ICD-10-CM

## 2017-05-10 DIAGNOSIS — R413 Other amnesia: Secondary | ICD-10-CM

## 2017-05-10 MED ORDER — QUETIAPINE FUMARATE 25 MG PO TABS
25.0000 mg | ORAL_TABLET | Freq: Every day | ORAL | 0 refills | Status: DC
Start: 2017-05-10 — End: 2017-06-13

## 2017-05-10 NOTE — Patient Instructions (Signed)
Thank you for choosing [facility].  SUMMARY AND INSTRUCTIONS:  Please continue to take the metformin as prescribed.  Continue to monitor blood sugars 2-3 times per day.  Start the Seroquel daily.  Follow up in 1 month or sooner.   Medication:  Your prescription(s) have been submitted to your pharmacy or been printed and provided for you. Please take as directed and contact our office if you believe you are having problem(s) with the medication(s) or have any questions.  Follow up:  If your symptoms worsen or fail to improve, please contact our office for further instruction, or in case of emergency go directly to the emergency room at the closest medical facility.

## 2017-05-10 NOTE — Assessment & Plan Note (Signed)
Continues to experience decreases in mental status with additional confusion and some anxiety/restlessness per staff that her uncontrolled with current dosage of Zoloft and clonazepam. Start low-dose Seroquel. Does not appear to have delirium. Follow-up in one month or sooner.

## 2017-05-10 NOTE — Assessment & Plan Note (Signed)
Admitted to the hospital with a blood sugar of 50 and maintained on D10 and discharged with no medications. Elevating blood pressure was noted and restarted on metformin. Continue to monitor blood pressure 2-3 times per day and follow up for elevation. Consider additional medication if symptoms do not remain well controlled with nutrition.

## 2017-05-10 NOTE — Progress Notes (Signed)
Subjective:    Patient ID: Sheila [name], female    DOB: March 26, 1951, 66 y.o.   MRN: [id number]  Chief Complaint  Patient presents with  . Follow-up    sliding scale, sugars are elevated, very confused    HPI:  Sheila [name] is a 66 y.o. female who  has a past medical history of Arthritis; Diabetes mellitus; Hyperlipidemia; and Stroke ([place]) (2015). and presents today for a hospitalization follow up.  Recently evaluated in the emergency department and admitted to the hospital with lethargy and noted to have a blood sugar in the 50s that was improved with oral glucose. Physical exam she was oriented to person and 5 out of 5 motor bilateral grip strength and able to move bilateral lower extremities. She was noted to have trending hypoglycemia despite oral glucose. She was placed on a D10 drip. She was admitted for observation. Diagnosed with altered mental status most likely related to hypoglycemia. She was discharged with no diabetic medications and suggestion by hospitalist to control blood sugars with diet. Hospitalist recommended follow-up with primary care following monitoring blood sugars at least 4 times per day for the 2-3 days and notifying if blood sugars are greater than 200. Facility notified primary care of elevating blood sugars secondary to no medications and was restarted on metformin prior to office visit. All hospital records, labs, and imaging reviewed in detail.   Since leaving the hospital she has been off of her diabetic medications and her blood sugars have continued to elevate Per the skilled nursing facility staff member present. She was noted to have a blood sugar of 292 yesterday and was started on metformin which she has not taken yet. She continues to remain confused which has progressively worsened. Does have wandering and anxiety.She is a very poor historian.    Allergies  Allergen Reactions  . Sulfa Antibiotics Other (See Comments)    As noted on MAR  .  Paxil [Paroxetine Hcl] Other (See Comments)    Dreams/ nightmares      Outpatient Medications Prior to Visit  Medication Sig Dispense Refill  . atorvastatin (LIPITOR) 40 MG tablet Take 1 tablet (40 mg total) by mouth daily at 6 PM.    . clopidogrel (PLAVIX) 75 MG tablet Take 1 tablet (75 mg total) by mouth daily. 30 tablet   . fish oil-omega-3 fatty acids 1000 MG capsule Take 1 g by mouth daily.    Marland Kitchen gabapentin (NEURONTIN) 300 MG capsule Take 1 capsule by mouth at  bedtime (Patient taking differently: Take 300mg  capsule by mouth at  bedtime) 30 capsule 0  . LORazepam (ATIVAN) 0.5 MG tablet TAKE ONE TABLET BY MOUTH TWICE DAILY AS NEEDED FOR ANXIETY 30 tablet 0  . meloxicam (MOBIC) 15 MG tablet Take 15 mg by mouth daily.    . metFORMIN (GLUCOPHAGE) 500 MG tablet Take 1 tablet (500 mg total) by mouth 2 (two) times daily with a meal. 60 tablet 0  . Multiple Vitamin (DAILY-VITE) TABS Take 1 tablet by mouth every morning.    . sertraline (ZOLOFT) 100 MG tablet Take 1 tablet by mouth  daily (Patient taking differently: Take 100 mg by mouth once a day) 90 tablet 1   No facility-administered medications prior to visit.       Past Surgical History:  Procedure Laterality Date  . CHOLECYSTECTOMY    . TONSILLECTOMY        Past Medical History:  Diagnosis Date  . Arthritis   .  Diabetes mellitus    complicated by peripheral neuropathy  . Hyperlipidemia   . Stroke [facility]) 2015   residual right foot drop      Review of Systems  Unable to perform ROS: Dementia      Objective:    BP 116/60 (BP Location: Left Arm, Patient Position: Sitting, Cuff Size: Large)   Pulse 79   Temp 98.6 F (37 C) (Oral)   Resp 16   Ht 5\' 2"  (1.575 m)   SpO2 92%  Nursing note and vital signs reviewed.  Physical Exam  Constitutional: She appears well-developed and well-nourished. No distress.  Cardiovascular: Normal rate, regular rhythm, normal heart sounds and intact distal pulses.   Pulmonary/Chest:  Effort normal and breath sounds normal.  Neurological: She is alert. She is disoriented. No cranial nerve deficit or sensory deficit.  Skin: Skin is warm and dry.  Psychiatric: She has a normal mood and affect. Her behavior is normal. Judgment and thought content normal.       Assessment & Plan:   Problem List Items Addressed This Visit      Endocrine   Type 2 diabetes mellitus with neurologic complication ([place])    Admitted to the hospital with a blood sugar of 50 and maintained on D10 and discharged with no medications. Elevating blood pressure was noted and restarted on metformin. Continue to monitor blood pressure 2-3 times per day and follow up for elevation. Consider additional medication if symptoms do not remain well controlled with nutrition.        Other   Memory changes    Continues to experience decreases in mental status with additional confusion and some anxiety/restlessness per staff that her uncontrolled with current dosage of Zoloft and clonazepam. Start low-dose Seroquel. Does not appear to have delirium. Follow-up in one month or sooner.          I am having Sheila [name] start on QUEtiapine. I am also having her maintain her fish oil-omega-3 fatty acids, gabapentin, sertraline, clopidogrel, atorvastatin, meloxicam, LORazepam, DAILY-VITE, and metFORMIN.   Meds ordered this encounter  Medications  . QUEtiapine (SEROQUEL) 25 MG tablet    Sig: Take 1 tablet (25 mg total) by mouth at bedtime.    Dispense:  30 tablet    Refill:  0    Order Specific Question:   Supervising Provider    Answer:   Pricilla Holm A [[id number]]     Follow-up: Return in about 1 month (around 06/10/2017), or if symptoms worsen or fail to improve.  Mauricio Po, FNP

## 2017-05-10 NOTE — Telephone Encounter (Signed)
Discussed at office visit. See notes.

## 2017-05-11 DIAGNOSIS — Z0279 Encounter for issue of other medical certificate: Secondary | ICD-10-CM

## 2017-05-12 ENCOUNTER — Telehealth: Payer: Self-pay

## 2017-05-12 NOTE — Telephone Encounter (Signed)
Recd call from alecia/[facility]---is patient's care giver---reporting blood sugar glucose reading 380--per greg calone, I have faxed over order to take 30 units of lantus now and continue with normal dosing of metformin---and continue taking lantus once daily while monitoring sugars, call back if sugars are high or low---orders have been faxed to attn: of alecia at [phone] and alecia's contact number is [phone]

## 2017-05-26 NOTE — Telephone Encounter (Signed)
Alisha w/ [facility] at [phone] called back in regard.  States was already taking patients sugar 4 times a day and has received order to take sugar 2 additional times.  Would like to clarify if sugar needs to be taken 6 times a day or leave at 4?

## 2017-05-26 NOTE — Telephone Encounter (Signed)
Called back and made aware.

## 2017-05-26 NOTE — Telephone Encounter (Signed)
Ok to leave it at 4 times per day.

## 2017-05-27 ENCOUNTER — Telehealth: Payer: Self-pay | Admitting: Family

## 2017-05-27 NOTE — Telephone Encounter (Signed)
[name] from the [facility] called to let Sheila [name] know that this pts BS was 428 at 4:00pm today.

## 2017-05-27 NOTE — Telephone Encounter (Signed)
What are the other blood sugars as she is having them checked 4 times per day?

## 2017-05-27 NOTE — Telephone Encounter (Signed)
Please call [name] with [facility] at [phone]. They are concerned with BS. That is her cell.

## 2017-05-30 NOTE — Telephone Encounter (Signed)
Called [facility] to ask them to fax over BS for the weekend and today.

## 2017-06-02 ENCOUNTER — Telehealth: Payer: Self-pay | Admitting: Family

## 2017-06-02 DIAGNOSIS — E1142 Type 2 diabetes mellitus with diabetic polyneuropathy: Secondary | ICD-10-CM

## 2017-06-02 MED ORDER — DULAGLUTIDE 0.75 MG/0.5ML ~~LOC~~ SOAJ: SUBCUTANEOUS | 4 refills | Status: DC

## 2017-06-02 NOTE — Telephone Encounter (Signed)
Sheila [name] [phone]   Need hard script faxed to [facility] for trulicity

## 2017-06-02 NOTE — Telephone Encounter (Signed)
Rx has been sent  

## 2017-06-03 ENCOUNTER — Telehealth: Payer: Self-pay

## 2017-06-03 MED ORDER — LORAZEPAM 0.5 MG PO TABS: ORAL_TABLET | ORAL | 0 refills | Status: DC

## 2017-06-03 NOTE — Telephone Encounter (Signed)
Faxed script to [facility] @ [phone]...Sheila [name]

## 2017-06-03 NOTE — Telephone Encounter (Signed)
Medication printed to be faxed.  

## 2017-06-03 NOTE — Telephone Encounter (Signed)
Requesting refill of lorazepam to be sent to [facility]. Was sent in fax from [facility]. I am unable to find pt in [facility]

## 2017-06-13 ENCOUNTER — Telehealth: Payer: Self-pay | Admitting: Family

## 2017-06-13 MED ORDER — METFORMIN HCL 500 MG PO TABS: 500.0000 mg | ORAL_TABLET | Freq: Two times a day (BID) | ORAL | 0 refills | Status: DC

## 2017-06-13 MED ORDER — QUETIAPINE FUMARATE 25 MG PO TABS: 25.0000 mg | ORAL_TABLET | Freq: Every day | ORAL | 0 refills | Status: DC

## 2017-06-13 NOTE — Telephone Encounter (Signed)
Rx sent 

## 2017-06-13 NOTE — Addendum Note (Signed)
Addended by: Delice Bison E on: 06/13/2017 03:42 PM   Modules accepted: Orders

## 2017-06-13 NOTE — Telephone Encounter (Signed)
Pt is also needing a refill on Metformin sent to [facility]

## 2017-06-13 NOTE — Telephone Encounter (Signed)
Pt needs a refill of QUEtiapine (SEROQUEL) 25 MG tablet Please send to [facility]

## 2017-06-23 ENCOUNTER — Telehealth: Payer: Self-pay | Admitting: Family

## 2017-06-23 NOTE — Telephone Encounter (Signed)
How have her blood sugars been? If consistently elevated about 200 we will increase medications. Otherwise continue to monitor.

## 2017-06-23 NOTE — Telephone Encounter (Signed)
Joselyn from [facility] called to let Sheila [name] know the pts BS was 257 today around 3:40pm.

## 2017-06-23 NOTE — Telephone Encounter (Signed)
Returned call to [place] and LVM for her to call back in regards.

## 2017-06-23 NOTE — Telephone Encounter (Signed)
The facility can not give injections. They want to know if an alternative can be sent in.  [phone] - Jenny Reichmann

## 2017-06-24 MED ORDER — INSULIN PEN NEEDLE 31G X 6 MM MISC: 1 refills | Status: DC

## 2017-06-24 MED ORDER — INSULIN GLARGINE 100 UNIT/ML SOLOSTAR PEN: 30.0000 [IU] | PEN_INJECTOR | Freq: Every day | SUBCUTANEOUS | 2 refills | Status: DC

## 2017-06-24 NOTE — Addendum Note (Signed)
Addended by: Delice Bison E on: 06/24/2017 03:54 PM   Modules accepted: Orders

## 2017-06-24 NOTE — Telephone Encounter (Signed)
Keep Lantus at 30 units daily and metformin as prescribed. Continue to monitor blood sugars twice daily.

## 2017-06-24 NOTE — Telephone Encounter (Signed)
Pt is currently on lantus 30 units daily. I did not see it listed. Needs pen needles sent in for the lantus. Can not take trulicity at the facility she is at. She just moved to a different facility a few days ago so that is why they can not give the trulicity injection. They can give insulin injections. Please send in an alternative to trulicity and advise if she is to continue taking her insulin due to it not being listed on her current med list. Her recent sugar readings were 140s-150s. Wants all new rxs to be faxed to facility for son to pick up and take to preferred pharmacy.   Fax #- [phone]

## 2017-06-24 NOTE — Addendum Note (Signed)
Addended by: Delice Bison E on: 06/24/2017 01:27 PM   Modules accepted: Orders

## 2017-06-24 NOTE — Telephone Encounter (Signed)
[facility] needs a DC order for the instructions on the insulin pen and metformin and to stop trulicity and to keep up with blood sugars    [facility] Fax # [phone]  Also they will faxing form over to use for the pt

## 2017-06-27 NOTE — Telephone Encounter (Signed)
New orders have been faxed. 

## 2017-06-27 NOTE — Telephone Encounter (Signed)
Sheila [name] from [facility] called reporting blood sugar,  Today at 3:50 her BS was 250  [phone]

## 2017-06-27 NOTE — Telephone Encounter (Signed)
Orders have been faxed to facility

## 2017-06-27 NOTE — Telephone Encounter (Signed)
[facility] called regarding this order they have not seen it

## 2017-06-27 NOTE — Telephone Encounter (Signed)
Noted - please have at least 1-2 days worth of blood sugars when reporting these numbers. Continue to take medications as prescribed.

## 2017-06-28 NOTE — Telephone Encounter (Signed)
[facility] was notified of gregs request   Patients BS today was 340

## 2017-06-28 NOTE — Telephone Encounter (Signed)
Faxed response below over to [facility].

## 2017-06-30 NOTE — Telephone Encounter (Signed)
Please obtain additional blood sugar numbers to avoid over medication please.

## 2017-07-05 NOTE — Telephone Encounter (Signed)
Please have her increase Lantus by 5 units nightly. Follow up with blood sugars on Monday.

## 2017-07-05 NOTE — Telephone Encounter (Signed)
7/8-120 am 7/8- 250 pm 7/9-104 am 7/9-275 pm  7/10-92 am 7/10-319 pm   These are the readings for the last couple of days. Please advise if you feel any changes are needed to pts medications.

## 2017-07-06 NOTE — Telephone Encounter (Signed)
Orders have been faxed over to [facility].

## 2017-07-11 ENCOUNTER — Telehealth: Payer: Self-pay | Admitting: Family

## 2017-07-11 ENCOUNTER — Telehealth: Payer: Self-pay

## 2017-07-11 NOTE — Telephone Encounter (Signed)
Please increase morning dose of metformin to 1000 mg and keep evening dose at 500 mg. Follow up blood sugars in about 1 week or sooner if remained elevated.

## 2017-07-11 NOTE — Telephone Encounter (Signed)
Received a call from [facility] pts blood sugar this evening was 419. Readings in the evenings have been ranging at 213,224, and 313. Mornings are in the lower 100s usually. Please advise.

## 2017-07-12 ENCOUNTER — Telehealth: Payer: Self-pay | Admitting: Family

## 2017-07-12 NOTE — Telephone Encounter (Signed)
Pt has changed pharmacies is now using  [facility] [address]

## 2017-07-12 NOTE — Telephone Encounter (Signed)
Orders have been sent by fax.

## 2017-07-13 NOTE — Telephone Encounter (Signed)
Pharmacy has been changed in chart.  

## 2017-07-21 ENCOUNTER — Telehealth: Payer: Self-pay | Admitting: Family

## 2017-07-21 NOTE — Telephone Encounter (Signed)
Kia from [facility] called stating that the pt is going to be using a vial for her insulin. She was previously using lantus pens and has two left. They need written orders to be able to use up the last two lantus pens before she starts using the vial. Their fax number is [phone].

## 2017-07-22 NOTE — Telephone Encounter (Signed)
Order faxed.

## 2017-08-10 DIAGNOSIS — M79675 Pain in left toe(s): Secondary | ICD-10-CM | POA: Diagnosis not present

## 2017-08-10 DIAGNOSIS — B351 Tinea unguium: Secondary | ICD-10-CM | POA: Diagnosis not present

## 2017-08-10 DIAGNOSIS — Z0279 Encounter for issue of other medical certificate: Secondary | ICD-10-CM

## 2017-08-10 DIAGNOSIS — M79674 Pain in right toe(s): Secondary | ICD-10-CM | POA: Diagnosis not present

## 2017-08-22 ENCOUNTER — Telehealth: Payer: Self-pay | Admitting: Family

## 2017-08-22 MED ORDER — INSULIN GLARGINE 100 UNIT/ML SOLOSTAR PEN: 30.0000 [IU] | PEN_INJECTOR | Freq: Every day | SUBCUTANEOUS | 0 refills | Status: DC

## 2017-08-22 NOTE — Telephone Encounter (Signed)
Pt son called in and pt needs refill on her insulin    Send to  [facility] on [address]

## 2017-08-22 NOTE — Telephone Encounter (Signed)
Reviewed chart pt was due for f/u back in June sent 30 day supply until appt...Johny Chess

## 2017-08-30 DIAGNOSIS — I739 Peripheral vascular disease, unspecified: Secondary | ICD-10-CM | POA: Diagnosis not present

## 2017-08-30 DIAGNOSIS — I639 Cerebral infarction, unspecified: Secondary | ICD-10-CM | POA: Diagnosis not present

## 2017-08-30 DIAGNOSIS — F0150 Vascular dementia without behavioral disturbance: Secondary | ICD-10-CM | POA: Diagnosis not present

## 2017-08-30 DIAGNOSIS — M6281 Muscle weakness (generalized): Secondary | ICD-10-CM | POA: Diagnosis not present

## 2017-08-30 DIAGNOSIS — E11649 Type 2 diabetes mellitus with hypoglycemia without coma: Secondary | ICD-10-CM | POA: Diagnosis not present

## 2017-08-30 DIAGNOSIS — E1140 Type 2 diabetes mellitus with diabetic neuropathy, unspecified: Secondary | ICD-10-CM | POA: Diagnosis not present

## 2017-09-01 DIAGNOSIS — D649 Anemia, unspecified: Secondary | ICD-10-CM | POA: Diagnosis not present

## 2017-09-01 DIAGNOSIS — E119 Type 2 diabetes mellitus without complications: Secondary | ICD-10-CM | POA: Diagnosis not present

## 2017-09-01 DIAGNOSIS — Z79899 Other long term (current) drug therapy: Secondary | ICD-10-CM | POA: Diagnosis not present

## 2017-09-21 DIAGNOSIS — R2689 Other abnormalities of gait and mobility: Secondary | ICD-10-CM | POA: Diagnosis not present

## 2017-09-21 DIAGNOSIS — R2681 Unsteadiness on feet: Secondary | ICD-10-CM | POA: Diagnosis not present

## 2017-09-21 DIAGNOSIS — M6281 Muscle weakness (generalized): Secondary | ICD-10-CM | POA: Diagnosis not present

## 2017-09-23 DIAGNOSIS — R2681 Unsteadiness on feet: Secondary | ICD-10-CM | POA: Diagnosis not present

## 2017-09-23 DIAGNOSIS — M6281 Muscle weakness (generalized): Secondary | ICD-10-CM | POA: Diagnosis not present

## 2017-09-23 DIAGNOSIS — R2689 Other abnormalities of gait and mobility: Secondary | ICD-10-CM | POA: Diagnosis not present

## 2017-09-28 DIAGNOSIS — R2681 Unsteadiness on feet: Secondary | ICD-10-CM | POA: Diagnosis not present

## 2017-09-28 DIAGNOSIS — R2689 Other abnormalities of gait and mobility: Secondary | ICD-10-CM | POA: Diagnosis not present

## 2017-09-28 DIAGNOSIS — M6281 Muscle weakness (generalized): Secondary | ICD-10-CM | POA: Diagnosis not present

## 2017-09-30 DIAGNOSIS — R2681 Unsteadiness on feet: Secondary | ICD-10-CM | POA: Diagnosis not present

## 2017-09-30 DIAGNOSIS — M6281 Muscle weakness (generalized): Secondary | ICD-10-CM | POA: Diagnosis not present

## 2017-09-30 DIAGNOSIS — S51802D Unspecified open wound of left forearm, subsequent encounter: Secondary | ICD-10-CM | POA: Diagnosis not present

## 2017-09-30 DIAGNOSIS — R2689 Other abnormalities of gait and mobility: Secondary | ICD-10-CM | POA: Diagnosis not present

## 2017-09-30 DIAGNOSIS — E1151 Type 2 diabetes mellitus with diabetic peripheral angiopathy without gangrene: Secondary | ICD-10-CM | POA: Diagnosis not present

## 2017-09-30 DIAGNOSIS — I70209 Unspecified atherosclerosis of native arteries of extremities, unspecified extremity: Secondary | ICD-10-CM | POA: Diagnosis not present

## 2017-09-30 DIAGNOSIS — E1142 Type 2 diabetes mellitus with diabetic polyneuropathy: Secondary | ICD-10-CM | POA: Diagnosis not present

## 2017-10-03 DIAGNOSIS — E1142 Type 2 diabetes mellitus with diabetic polyneuropathy: Secondary | ICD-10-CM | POA: Diagnosis not present

## 2017-10-03 DIAGNOSIS — S51802D Unspecified open wound of left forearm, subsequent encounter: Secondary | ICD-10-CM | POA: Diagnosis not present

## 2017-10-04 DIAGNOSIS — R2681 Unsteadiness on feet: Secondary | ICD-10-CM | POA: Diagnosis not present

## 2017-10-04 DIAGNOSIS — S51802D Unspecified open wound of left forearm, subsequent encounter: Secondary | ICD-10-CM | POA: Diagnosis not present

## 2017-10-04 DIAGNOSIS — E1142 Type 2 diabetes mellitus with diabetic polyneuropathy: Secondary | ICD-10-CM | POA: Diagnosis not present

## 2017-10-04 DIAGNOSIS — R2689 Other abnormalities of gait and mobility: Secondary | ICD-10-CM | POA: Diagnosis not present

## 2017-10-04 DIAGNOSIS — M6281 Muscle weakness (generalized): Secondary | ICD-10-CM | POA: Diagnosis not present

## 2017-10-05 DIAGNOSIS — S51802D Unspecified open wound of left forearm, subsequent encounter: Secondary | ICD-10-CM | POA: Diagnosis not present

## 2017-10-05 DIAGNOSIS — E1142 Type 2 diabetes mellitus with diabetic polyneuropathy: Secondary | ICD-10-CM | POA: Diagnosis not present

## 2017-10-06 DIAGNOSIS — S51802D Unspecified open wound of left forearm, subsequent encounter: Secondary | ICD-10-CM | POA: Diagnosis not present

## 2017-10-06 DIAGNOSIS — E1142 Type 2 diabetes mellitus with diabetic polyneuropathy: Secondary | ICD-10-CM | POA: Diagnosis not present

## 2017-10-07 DIAGNOSIS — S51802D Unspecified open wound of left forearm, subsequent encounter: Secondary | ICD-10-CM | POA: Diagnosis not present

## 2017-10-07 DIAGNOSIS — E1142 Type 2 diabetes mellitus with diabetic polyneuropathy: Secondary | ICD-10-CM | POA: Diagnosis not present

## 2017-10-10 DIAGNOSIS — E1142 Type 2 diabetes mellitus with diabetic polyneuropathy: Secondary | ICD-10-CM | POA: Diagnosis not present

## 2017-10-10 DIAGNOSIS — S51802D Unspecified open wound of left forearm, subsequent encounter: Secondary | ICD-10-CM | POA: Diagnosis not present

## 2017-10-11 DIAGNOSIS — E1142 Type 2 diabetes mellitus with diabetic polyneuropathy: Secondary | ICD-10-CM | POA: Diagnosis not present

## 2017-10-11 DIAGNOSIS — S51802D Unspecified open wound of left forearm, subsequent encounter: Secondary | ICD-10-CM | POA: Diagnosis not present

## 2017-10-11 DIAGNOSIS — R2689 Other abnormalities of gait and mobility: Secondary | ICD-10-CM | POA: Diagnosis not present

## 2017-10-11 DIAGNOSIS — M6281 Muscle weakness (generalized): Secondary | ICD-10-CM | POA: Diagnosis not present

## 2017-10-11 DIAGNOSIS — R2681 Unsteadiness on feet: Secondary | ICD-10-CM | POA: Diagnosis not present

## 2017-10-12 DIAGNOSIS — R2681 Unsteadiness on feet: Secondary | ICD-10-CM | POA: Diagnosis not present

## 2017-10-12 DIAGNOSIS — M6281 Muscle weakness (generalized): Secondary | ICD-10-CM | POA: Diagnosis not present

## 2017-10-12 DIAGNOSIS — R2689 Other abnormalities of gait and mobility: Secondary | ICD-10-CM | POA: Diagnosis not present

## 2017-10-12 DIAGNOSIS — S51802D Unspecified open wound of left forearm, subsequent encounter: Secondary | ICD-10-CM | POA: Diagnosis not present

## 2017-10-12 DIAGNOSIS — E1142 Type 2 diabetes mellitus with diabetic polyneuropathy: Secondary | ICD-10-CM | POA: Diagnosis not present

## 2017-10-13 DIAGNOSIS — E1142 Type 2 diabetes mellitus with diabetic polyneuropathy: Secondary | ICD-10-CM | POA: Diagnosis not present

## 2017-10-13 DIAGNOSIS — S51802D Unspecified open wound of left forearm, subsequent encounter: Secondary | ICD-10-CM | POA: Diagnosis not present

## 2017-10-17 DIAGNOSIS — S51802D Unspecified open wound of left forearm, subsequent encounter: Secondary | ICD-10-CM | POA: Diagnosis not present

## 2017-10-17 DIAGNOSIS — E1142 Type 2 diabetes mellitus with diabetic polyneuropathy: Secondary | ICD-10-CM | POA: Diagnosis not present

## 2017-10-19 DIAGNOSIS — E1142 Type 2 diabetes mellitus with diabetic polyneuropathy: Secondary | ICD-10-CM | POA: Diagnosis not present

## 2017-10-19 DIAGNOSIS — S51802D Unspecified open wound of left forearm, subsequent encounter: Secondary | ICD-10-CM | POA: Diagnosis not present

## 2017-10-20 DIAGNOSIS — S51802D Unspecified open wound of left forearm, subsequent encounter: Secondary | ICD-10-CM | POA: Diagnosis not present

## 2017-10-20 DIAGNOSIS — E1142 Type 2 diabetes mellitus with diabetic polyneuropathy: Secondary | ICD-10-CM | POA: Diagnosis not present

## 2017-10-24 DIAGNOSIS — S51802D Unspecified open wound of left forearm, subsequent encounter: Secondary | ICD-10-CM | POA: Diagnosis not present

## 2017-10-24 DIAGNOSIS — E1142 Type 2 diabetes mellitus with diabetic polyneuropathy: Secondary | ICD-10-CM | POA: Diagnosis not present

## 2017-10-25 DIAGNOSIS — S51802D Unspecified open wound of left forearm, subsequent encounter: Secondary | ICD-10-CM | POA: Diagnosis not present

## 2017-10-25 DIAGNOSIS — E785 Hyperlipidemia, unspecified: Secondary | ICD-10-CM | POA: Diagnosis not present

## 2017-10-25 DIAGNOSIS — R159 Full incontinence of feces: Secondary | ICD-10-CM | POA: Diagnosis not present

## 2017-10-25 DIAGNOSIS — I639 Cerebral infarction, unspecified: Secondary | ICD-10-CM | POA: Diagnosis not present

## 2017-10-25 DIAGNOSIS — R3981 Functional urinary incontinence: Secondary | ICD-10-CM | POA: Diagnosis not present

## 2017-10-25 DIAGNOSIS — M6281 Muscle weakness (generalized): Secondary | ICD-10-CM | POA: Diagnosis not present

## 2017-10-25 DIAGNOSIS — F0150 Vascular dementia without behavioral disturbance: Secondary | ICD-10-CM | POA: Diagnosis not present

## 2017-10-25 DIAGNOSIS — E11649 Type 2 diabetes mellitus with hypoglycemia without coma: Secondary | ICD-10-CM | POA: Diagnosis not present

## 2017-10-25 DIAGNOSIS — E1142 Type 2 diabetes mellitus with diabetic polyneuropathy: Secondary | ICD-10-CM | POA: Diagnosis not present

## 2017-10-25 DIAGNOSIS — I739 Peripheral vascular disease, unspecified: Secondary | ICD-10-CM | POA: Diagnosis not present

## 2017-10-27 DIAGNOSIS — S51802D Unspecified open wound of left forearm, subsequent encounter: Secondary | ICD-10-CM | POA: Diagnosis not present

## 2017-10-27 DIAGNOSIS — E1142 Type 2 diabetes mellitus with diabetic polyneuropathy: Secondary | ICD-10-CM | POA: Diagnosis not present

## 2017-10-28 DIAGNOSIS — E1142 Type 2 diabetes mellitus with diabetic polyneuropathy: Secondary | ICD-10-CM | POA: Diagnosis not present

## 2017-10-28 DIAGNOSIS — S51802D Unspecified open wound of left forearm, subsequent encounter: Secondary | ICD-10-CM | POA: Diagnosis not present

## 2017-10-31 DIAGNOSIS — E1142 Type 2 diabetes mellitus with diabetic polyneuropathy: Secondary | ICD-10-CM | POA: Diagnosis not present

## 2017-10-31 DIAGNOSIS — S51802D Unspecified open wound of left forearm, subsequent encounter: Secondary | ICD-10-CM | POA: Diagnosis not present

## 2017-11-02 DIAGNOSIS — S51802D Unspecified open wound of left forearm, subsequent encounter: Secondary | ICD-10-CM | POA: Diagnosis not present

## 2017-11-02 DIAGNOSIS — E1142 Type 2 diabetes mellitus with diabetic polyneuropathy: Secondary | ICD-10-CM | POA: Diagnosis not present

## 2017-11-03 DIAGNOSIS — E1142 Type 2 diabetes mellitus with diabetic polyneuropathy: Secondary | ICD-10-CM | POA: Diagnosis not present

## 2017-11-03 DIAGNOSIS — S51802D Unspecified open wound of left forearm, subsequent encounter: Secondary | ICD-10-CM | POA: Diagnosis not present

## 2017-11-08 DIAGNOSIS — E1142 Type 2 diabetes mellitus with diabetic polyneuropathy: Secondary | ICD-10-CM | POA: Diagnosis not present

## 2017-11-08 DIAGNOSIS — S51802D Unspecified open wound of left forearm, subsequent encounter: Secondary | ICD-10-CM | POA: Diagnosis not present

## 2017-11-10 DIAGNOSIS — S51802D Unspecified open wound of left forearm, subsequent encounter: Secondary | ICD-10-CM | POA: Diagnosis not present

## 2017-11-10 DIAGNOSIS — E1142 Type 2 diabetes mellitus with diabetic polyneuropathy: Secondary | ICD-10-CM | POA: Diagnosis not present

## 2017-11-23 DIAGNOSIS — M79674 Pain in right toe(s): Secondary | ICD-10-CM | POA: Diagnosis not present

## 2017-11-23 DIAGNOSIS — M79675 Pain in left toe(s): Secondary | ICD-10-CM | POA: Diagnosis not present

## 2017-11-23 DIAGNOSIS — B351 Tinea unguium: Secondary | ICD-10-CM | POA: Diagnosis not present

## 2017-11-25 DIAGNOSIS — S51802D Unspecified open wound of left forearm, subsequent encounter: Secondary | ICD-10-CM | POA: Diagnosis not present

## 2017-11-25 DIAGNOSIS — E1142 Type 2 diabetes mellitus with diabetic polyneuropathy: Secondary | ICD-10-CM | POA: Diagnosis not present

## 2018-01-03 DIAGNOSIS — M6281 Muscle weakness (generalized): Secondary | ICD-10-CM | POA: Diagnosis not present

## 2018-01-03 DIAGNOSIS — Z9181 History of falling: Secondary | ICD-10-CM | POA: Diagnosis not present

## 2018-01-03 DIAGNOSIS — E1151 Type 2 diabetes mellitus with diabetic peripheral angiopathy without gangrene: Secondary | ICD-10-CM | POA: Diagnosis not present

## 2018-01-03 DIAGNOSIS — F0150 Vascular dementia without behavioral disturbance: Secondary | ICD-10-CM | POA: Diagnosis not present

## 2018-01-03 DIAGNOSIS — E1140 Type 2 diabetes mellitus with diabetic neuropathy, unspecified: Secondary | ICD-10-CM | POA: Diagnosis not present

## 2018-01-03 DIAGNOSIS — Z8673 Personal history of transient ischemic attack (TIA), and cerebral infarction without residual deficits: Secondary | ICD-10-CM | POA: Diagnosis not present

## 2018-01-04 ENCOUNTER — Inpatient Hospital Stay (HOSPITAL_COMMUNITY): Payer: Medicare Other

## 2018-01-04 ENCOUNTER — Inpatient Hospital Stay (HOSPITAL_COMMUNITY)
Admission: EM | Admit: 2018-01-04 | Discharge: 2018-01-10 | DRG: 064 | Disposition: A | Discharge status: Skilled Nursing Facility | Payer: Medicare Other | Attending: Neurology | Admitting: Neurology

## 2018-01-04 ENCOUNTER — Emergency Department (HOSPITAL_COMMUNITY): Payer: Medicare Other

## 2018-01-04 DIAGNOSIS — I69398 Other sequelae of cerebral infarction: Secondary | ICD-10-CM | POA: Diagnosis not present

## 2018-01-04 DIAGNOSIS — L500 Allergic urticaria: Secondary | ICD-10-CM | POA: Diagnosis not present

## 2018-01-04 DIAGNOSIS — G9340 Encephalopathy, unspecified: Secondary | ICD-10-CM | POA: Diagnosis not present

## 2018-01-04 DIAGNOSIS — I69091 Dysphagia following nontraumatic subarachnoid hemorrhage: Secondary | ICD-10-CM | POA: Diagnosis not present

## 2018-01-04 DIAGNOSIS — M21371 Foot drop, right foot: Secondary | ICD-10-CM | POA: Diagnosis present

## 2018-01-04 DIAGNOSIS — Z794 Long term (current) use of insulin: Secondary | ICD-10-CM | POA: Diagnosis not present

## 2018-01-04 DIAGNOSIS — E1149 Type 2 diabetes mellitus with other diabetic neurological complication: Secondary | ICD-10-CM | POA: Diagnosis present

## 2018-01-04 DIAGNOSIS — D638 Anemia in other chronic diseases classified elsewhere: Secondary | ICD-10-CM | POA: Diagnosis present

## 2018-01-04 DIAGNOSIS — I629 Nontraumatic intracranial hemorrhage, unspecified: Secondary | ICD-10-CM | POA: Diagnosis not present

## 2018-01-04 DIAGNOSIS — T508X5A Adverse effect of diagnostic agents, initial encounter: Secondary | ICD-10-CM | POA: Diagnosis not present

## 2018-01-04 DIAGNOSIS — S41112A Laceration without foreign body of left upper arm, initial encounter: Secondary | ICD-10-CM | POA: Diagnosis present

## 2018-01-04 DIAGNOSIS — I169 Hypertensive crisis, unspecified: Secondary | ICD-10-CM | POA: Diagnosis not present

## 2018-01-04 DIAGNOSIS — N390 Urinary tract infection, site not specified: Secondary | ICD-10-CM | POA: Diagnosis present

## 2018-01-04 DIAGNOSIS — E785 Hyperlipidemia, unspecified: Secondary | ICD-10-CM | POA: Diagnosis present

## 2018-01-04 DIAGNOSIS — I639 Cerebral infarction, unspecified: Secondary | ICD-10-CM

## 2018-01-04 DIAGNOSIS — I63549 Cerebral infarction due to unspecified occlusion or stenosis of unspecified cerebellar artery: Secondary | ICD-10-CM | POA: Diagnosis not present

## 2018-01-04 DIAGNOSIS — Z7902 Long term (current) use of antithrombotics/antiplatelets: Secondary | ICD-10-CM

## 2018-01-04 DIAGNOSIS — I10 Essential (primary) hypertension: Secondary | ICD-10-CM | POA: Diagnosis present

## 2018-01-04 DIAGNOSIS — Z993 Dependence on wheelchair: Secondary | ICD-10-CM

## 2018-01-04 DIAGNOSIS — Y848 Other medical procedures as the cause of abnormal reaction of the patient, or of later complication, without mention of misadventure at the time of the procedure: Secondary | ICD-10-CM | POA: Diagnosis present

## 2018-01-04 DIAGNOSIS — E7849 Other hyperlipidemia: Secondary | ICD-10-CM | POA: Diagnosis not present

## 2018-01-04 DIAGNOSIS — G92 Toxic encephalopathy: Secondary | ICD-10-CM | POA: Diagnosis present

## 2018-01-04 DIAGNOSIS — R4701 Aphasia: Secondary | ICD-10-CM | POA: Diagnosis present

## 2018-01-04 DIAGNOSIS — I6789 Other cerebrovascular disease: Secondary | ICD-10-CM | POA: Diagnosis not present

## 2018-01-04 DIAGNOSIS — E1142 Type 2 diabetes mellitus with diabetic polyneuropathy: Secondary | ICD-10-CM | POA: Diagnosis present

## 2018-01-04 DIAGNOSIS — Z882 Allergy status to sulfonamides status: Secondary | ICD-10-CM

## 2018-01-04 DIAGNOSIS — F411 Generalized anxiety disorder: Secondary | ICD-10-CM | POA: Diagnosis not present

## 2018-01-04 DIAGNOSIS — R9401 Abnormal electroencephalogram [EEG]: Secondary | ICD-10-CM | POA: Diagnosis present

## 2018-01-04 DIAGNOSIS — I615 Nontraumatic intracerebral hemorrhage, intraventricular: Principal | ICD-10-CM | POA: Diagnosis present

## 2018-01-04 DIAGNOSIS — I619 Nontraumatic intracerebral hemorrhage, unspecified: Secondary | ICD-10-CM | POA: Diagnosis not present

## 2018-01-04 DIAGNOSIS — R414 Neurologic neglect syndrome: Secondary | ICD-10-CM | POA: Diagnosis present

## 2018-01-04 DIAGNOSIS — Y92238 Other place in hospital as the place of occurrence of the external cause: Secondary | ICD-10-CM | POA: Diagnosis present

## 2018-01-04 DIAGNOSIS — E1151 Type 2 diabetes mellitus with diabetic peripheral angiopathy without gangrene: Secondary | ICD-10-CM | POA: Diagnosis present

## 2018-01-04 DIAGNOSIS — E876 Hypokalemia: Secondary | ICD-10-CM | POA: Diagnosis present

## 2018-01-04 DIAGNOSIS — Z79899 Other long term (current) drug therapy: Secondary | ICD-10-CM

## 2018-01-04 DIAGNOSIS — M6281 Muscle weakness (generalized): Secondary | ICD-10-CM | POA: Diagnosis not present

## 2018-01-04 DIAGNOSIS — I6389 Other cerebral infarction: Secondary | ICD-10-CM | POA: Diagnosis not present

## 2018-01-04 DIAGNOSIS — X58XXXA Exposure to other specified factors, initial encounter: Secondary | ICD-10-CM | POA: Diagnosis present

## 2018-01-04 DIAGNOSIS — I69992 Facial weakness following unspecified cerebrovascular disease: Secondary | ICD-10-CM | POA: Diagnosis not present

## 2018-01-04 DIAGNOSIS — E875 Hyperkalemia: Secondary | ICD-10-CM | POA: Diagnosis not present

## 2018-01-04 DIAGNOSIS — I739 Peripheral vascular disease, unspecified: Secondary | ICD-10-CM | POA: Diagnosis not present

## 2018-01-04 DIAGNOSIS — Z6834 Body mass index (BMI) 34.0-34.9, adult: Secondary | ICD-10-CM

## 2018-01-04 DIAGNOSIS — R262 Difficulty in walking, not elsewhere classified: Secondary | ICD-10-CM | POA: Diagnosis not present

## 2018-01-04 DIAGNOSIS — E119 Type 2 diabetes mellitus without complications: Secondary | ICD-10-CM | POA: Diagnosis not present

## 2018-01-04 DIAGNOSIS — T45615A Adverse effect of thrombolytic drugs, initial encounter: Secondary | ICD-10-CM | POA: Diagnosis present

## 2018-01-04 DIAGNOSIS — E669 Obesity, unspecified: Secondary | ICD-10-CM | POA: Diagnosis present

## 2018-01-04 DIAGNOSIS — F0390 Unspecified dementia without behavioral disturbance: Secondary | ICD-10-CM | POA: Diagnosis present

## 2018-01-04 DIAGNOSIS — F329 Major depressive disorder, single episode, unspecified: Secondary | ICD-10-CM | POA: Diagnosis present

## 2018-01-04 DIAGNOSIS — Z791 Long term (current) use of non-steroidal anti-inflammatories (NSAID): Secondary | ICD-10-CM

## 2018-01-04 DIAGNOSIS — R569 Unspecified convulsions: Secondary | ICD-10-CM | POA: Diagnosis present

## 2018-01-04 DIAGNOSIS — R29810 Facial weakness: Secondary | ICD-10-CM | POA: Diagnosis present

## 2018-01-04 DIAGNOSIS — R4189 Other symptoms and signs involving cognitive functions and awareness: Secondary | ICD-10-CM | POA: Diagnosis not present

## 2018-01-04 DIAGNOSIS — Z9049 Acquired absence of other specified parts of digestive tract: Secondary | ICD-10-CM

## 2018-01-04 DIAGNOSIS — R509 Fever, unspecified: Secondary | ICD-10-CM | POA: Diagnosis not present

## 2018-01-04 DIAGNOSIS — R4182 Altered mental status, unspecified: Secondary | ICD-10-CM | POA: Diagnosis not present

## 2018-01-04 DIAGNOSIS — M1990 Unspecified osteoarthritis, unspecified site: Secondary | ICD-10-CM | POA: Diagnosis present

## 2018-01-04 DIAGNOSIS — I69220 Aphasia following other nontraumatic intracranial hemorrhage: Secondary | ICD-10-CM | POA: Diagnosis not present

## 2018-01-04 DIAGNOSIS — T424X5A Adverse effect of benzodiazepines, initial encounter: Secondary | ICD-10-CM | POA: Diagnosis present

## 2018-01-04 DIAGNOSIS — Z87891 Personal history of nicotine dependence: Secondary | ICD-10-CM

## 2018-01-04 DIAGNOSIS — G8194 Hemiplegia, unspecified affecting left nondominant side: Secondary | ICD-10-CM | POA: Diagnosis present

## 2018-01-04 DIAGNOSIS — Z888 Allergy status to other drugs, medicaments and biological substances status: Secondary | ICD-10-CM

## 2018-01-04 DIAGNOSIS — I6523 Occlusion and stenosis of bilateral carotid arteries: Secondary | ICD-10-CM | POA: Diagnosis not present

## 2018-01-04 DIAGNOSIS — Z833 Family history of diabetes mellitus: Secondary | ICD-10-CM

## 2018-01-04 DIAGNOSIS — R29818 Other symptoms and signs involving the nervous system: Secondary | ICD-10-CM | POA: Diagnosis not present

## 2018-01-04 LAB — ETHANOL

## 2018-01-04 LAB — APTT: APTT: 28 s (ref 24–36)

## 2018-01-04 LAB — CBC
HCT: 36.9 % (ref 36.0–46.0)
Hemoglobin: 12.5 g/dL (ref 12.0–15.0)
MCH: 29.4 pg (ref 26.0–34.0)
MCHC: 33.9 g/dL (ref 30.0–36.0)
MCV: 86.8 fL (ref 78.0–100.0)
PLATELETS: 259 10*3/uL (ref 150–400)
RBC: 4.25 MIL/uL (ref 3.87–5.11)
RDW: 13.6 % (ref 11.5–15.5)
WBC: 9.6 10*3/uL (ref 4.0–10.5)

## 2018-01-04 LAB — COMPREHENSIVE METABOLIC PANEL
ALBUMIN: 3.8 g/dL (ref 3.5–5.0)
ALT: 21 U/L (ref 14–54)
AST: 42 U/L — AB (ref 15–41)
Alkaline Phosphatase: 117 U/L (ref 38–126)
Anion gap: 8 (ref 5–15)
BUN: 9 mg/dL (ref 6–20)
CHLORIDE: 108 mmol/L (ref 101–111)
CO2: 25 mmol/L (ref 22–32)
CREATININE: 0.98 mg/dL (ref 0.44–1.00)
Calcium: 9.5 mg/dL (ref 8.9–10.3)
GFR calc non Af Amer: 58 mL/min — ABNORMAL LOW (ref >60)
Glucose, Bld: 100 mg/dL — ABNORMAL HIGH (ref 65–99)
Potassium: 5.3 mmol/L — ABNORMAL HIGH (ref 3.5–5.1)
SODIUM: 141 mmol/L (ref 135–145)
Total Bilirubin: 1.4 mg/dL — ABNORMAL HIGH (ref 0.3–1.2)
Total Protein: 6.9 g/dL (ref 6.5–8.1)

## 2018-01-04 LAB — DIFFERENTIAL
BASOS ABS: 0 10*3/uL (ref 0.0–0.1)
Basophils Relative: 0 %
Eosinophils Absolute: 0.5 10*3/uL (ref 0.0–0.7)
Eosinophils Relative: 5 %
Lymphocytes Relative: 13 %
Lymphs Abs: 1.3 10*3/uL (ref 0.7–4.0)
MONO ABS: 0.7 10*3/uL (ref 0.1–1.0)
Monocytes Relative: 7 %
NEUTROS ABS: 7.1 10*3/uL (ref 1.7–7.7)
NEUTROS PCT: 75 %

## 2018-01-04 LAB — I-STAT CHEM 8, ED
BUN: 13 mg/dL (ref 6–20)
CHLORIDE: 106 mmol/L (ref 101–111)
Calcium, Ion: 1.06 mmol/L — ABNORMAL LOW (ref 1.15–1.40)
Creatinine, Ser: 0.9 mg/dL (ref 0.44–1.00)
Glucose, Bld: 99 mg/dL (ref 65–99)
HCT: 37 % (ref 36.0–46.0)
Hemoglobin: 12.6 g/dL (ref 12.0–15.0)
POTASSIUM: 5.2 mmol/L — AB (ref 3.5–5.1)
SODIUM: 141 mmol/L (ref 135–145)
TCO2: 26 mmol/L (ref 22–32)

## 2018-01-04 LAB — MRSA PCR SCREENING: MRSA by PCR: NEGATIVE

## 2018-01-04 LAB — PROTIME-INR
INR: 0.97
PROTHROMBIN TIME: 12.8 s (ref 11.4–15.2)

## 2018-01-04 LAB — GLUCOSE, CAPILLARY
GLUCOSE-CAPILLARY: 260 mg/dL — AB (ref 65–99)
Glucose-Capillary: 218 mg/dL — ABNORMAL HIGH (ref 65–99)

## 2018-01-04 LAB — I-STAT TROPONIN, ED: Troponin i, poc: 0.01 ng/mL (ref 0.00–0.08)

## 2018-01-04 LAB — CBG MONITORING, ED: Glucose-Capillary: 96 mg/dL (ref 65–99)

## 2018-01-04 LAB — ABO/RH: ABO/RH(D): O POS

## 2018-01-04 MED ORDER — VANCOMYCIN HCL 10 G IV SOLR
2000.0000 mg | Freq: Once | INTRAVENOUS | Status: AC
  Administered 2018-01-04: 2000 mg via INTRAVENOUS
  Filled 2018-01-04: qty 2000

## 2018-01-04 MED ORDER — ACETAMINOPHEN 325 MG PO TABS: 650.0000 mg | ORAL_TABLET | ORAL | Status: DC | PRN

## 2018-01-04 MED ORDER — SODIUM CHLORIDE 0.9 % IV SOLN: 50.0000 mL | Freq: Once | INTRAVENOUS | Status: DC

## 2018-01-04 MED ORDER — DIPHENHYDRAMINE HCL 50 MG/ML IJ SOLN
25.0000 mg | Freq: Once | INTRAMUSCULAR | Status: AC
  Administered 2018-01-04: 25 mg via INTRAVENOUS

## 2018-01-04 MED ORDER — SODIUM CHLORIDE 0.9 % IV SOLN
2.0000 g | Freq: Four times a day (QID) | INTRAVENOUS | Status: DC
  Administered 2018-01-04 – 2018-01-07 (×11): 2 g via INTRAVENOUS
  Filled 2018-01-04 (×12): qty 2000

## 2018-01-04 MED ORDER — TRANEXAMIC ACID 1000 MG/10ML IV SOLN: 1000.0000 mg | Freq: Once | INTRAVENOUS | Status: DC

## 2018-01-04 MED ORDER — LABETALOL HCL 5 MG/ML IV SOLN
20.0000 mg | Freq: Once | INTRAVENOUS | Status: AC
  Administered 2018-01-04: 20 mg via INTRAVENOUS

## 2018-01-04 MED ORDER — ACETAMINOPHEN 160 MG/5ML PO SOLN
650.0000 mg | ORAL | Status: DC | PRN
  Filled 2018-01-04: qty 20.3

## 2018-01-04 MED ORDER — ALTEPLASE (STROKE) FULL DOSE INFUSION
77.0000 mg | Freq: Once | INTRAVENOUS | Status: AC
  Administered 2018-01-04: 77 mg via INTRAVENOUS
  Filled 2018-01-04: qty 100

## 2018-01-04 MED ORDER — ACYCLOVIR SODIUM 50 MG/ML IV SOLN
10.0000 mg/kg | Freq: Three times a day (TID) | INTRAVENOUS | Status: DC
  Administered 2018-01-04 – 2018-01-07 (×8): 655 mg via INTRAVENOUS
  Filled 2018-01-04 (×9): qty 13.1

## 2018-01-04 MED ORDER — IOPAMIDOL (ISOVUE-370) INJECTION 76%
INTRAVENOUS | Status: AC
  Filled 2018-01-04: qty 100

## 2018-01-04 MED ORDER — DEXTROSE 5 % IV SOLN
2.0000 g | Freq: Two times a day (BID) | INTRAVENOUS | Status: DC
  Administered 2018-01-04 – 2018-01-07 (×5): 2 g via INTRAVENOUS
  Filled 2018-01-04 (×6): qty 2

## 2018-01-04 MED ORDER — DIPHENHYDRAMINE HCL 50 MG/ML IJ SOLN
INTRAMUSCULAR | Status: AC
  Filled 2018-01-04: qty 1

## 2018-01-04 MED ORDER — SODIUM CHLORIDE 0.9 % IV SOLN
INTRAVENOUS | Status: DC
  Administered 2018-01-04 – 2018-01-09 (×5): via INTRAVENOUS
  Administered 2018-01-10: 75 mL via INTRAVENOUS

## 2018-01-04 MED ORDER — PANTOPRAZOLE SODIUM 40 MG IV SOLR: 40.0000 mg | Freq: Every day | INTRAVENOUS | Status: DC

## 2018-01-04 MED ORDER — LABETALOL HCL 5 MG/ML IV SOLN
INTRAVENOUS | Status: AC
  Filled 2018-01-04: qty 4

## 2018-01-04 MED ORDER — FAMOTIDINE IN NACL 20-0.9 MG/50ML-% IV SOLN
20.0000 mg | Freq: Two times a day (BID) | INTRAVENOUS | Status: DC
  Administered 2018-01-04 – 2018-01-06 (×4): 20 mg via INTRAVENOUS
  Filled 2018-01-04 (×4): qty 50

## 2018-01-04 MED ORDER — METHYLPREDNISOLONE SODIUM SUCC 40 MG IJ SOLR
40.0000 mg | Freq: Two times a day (BID) | INTRAMUSCULAR | Status: DC
  Administered 2018-01-05: 40 mg via INTRAVENOUS
  Filled 2018-01-04: qty 1

## 2018-01-04 MED ORDER — LORAZEPAM 2 MG/ML IJ SOLN
INTRAMUSCULAR | Status: AC
  Filled 2018-01-04: qty 1

## 2018-01-04 MED ORDER — TRANEXAMIC ACID 1000 MG/10ML IV SOLN
1000.0000 mg | INTRAVENOUS | Status: AC
  Administered 2018-01-04: 1000 mg via INTRAVENOUS
  Filled 2018-01-04: qty 10

## 2018-01-04 MED ORDER — METHYLPREDNISOLONE SODIUM SUCC 125 MG IJ SOLR: 125.0000 mg | Freq: Once | INTRAMUSCULAR | Status: DC

## 2018-01-04 MED ORDER — METHYLPREDNISOLONE SODIUM SUCC 125 MG IJ SOLR
INTRAMUSCULAR | Status: AC
  Filled 2018-01-04: qty 2

## 2018-01-04 MED ORDER — LORAZEPAM 2 MG/ML IJ SOLN
INTRAMUSCULAR | Status: AC
  Administered 2018-01-04: 2 mg
  Filled 2018-01-04: qty 1

## 2018-01-04 MED ORDER — SODIUM CHLORIDE 0.9 % IV SOLN: Freq: Once | INTRAVENOUS | Status: DC

## 2018-01-04 MED ORDER — NICARDIPINE HCL IN NACL 20-0.86 MG/200ML-% IV SOLN: 3.0000 mg/h | Freq: Once | INTRAVENOUS | Status: DC

## 2018-01-04 MED ORDER — STROKE: EARLY STAGES OF RECOVERY BOOK
Freq: Once | Status: DC
  Filled 2018-01-04: qty 1

## 2018-01-04 MED ORDER — LORAZEPAM 2 MG/ML IJ SOLN
2.0000 mg | Freq: Once | INTRAMUSCULAR | Status: AC
  Administered 2018-01-04: 2 mg via INTRAVENOUS

## 2018-01-04 MED ORDER — INSULIN ASPART 100 UNIT/ML ~~LOC~~ SOLN
0.0000 [IU] | SUBCUTANEOUS | Status: DC
  Administered 2018-01-04: 8 [IU] via SUBCUTANEOUS
  Administered 2018-01-05: 3 [IU] via SUBCUTANEOUS
  Administered 2018-01-05 – 2018-01-06 (×4): 2 [IU] via SUBCUTANEOUS
  Administered 2018-01-06: 3 [IU] via SUBCUTANEOUS
  Administered 2018-01-06 – 2018-01-08 (×7): 2 [IU] via SUBCUTANEOUS
  Administered 2018-01-08: 3 [IU] via SUBCUTANEOUS
  Administered 2018-01-09: 2 [IU] via SUBCUTANEOUS
  Administered 2018-01-09: 3 [IU] via SUBCUTANEOUS
  Administered 2018-01-09: 2 [IU] via SUBCUTANEOUS
  Administered 2018-01-09 (×2): 3 [IU] via SUBCUTANEOUS
  Administered 2018-01-10: 2 [IU] via SUBCUTANEOUS
  Administered 2018-01-10: 3 [IU] via SUBCUTANEOUS
  Administered 2018-01-10: 2 [IU] via SUBCUTANEOUS

## 2018-01-04 MED ORDER — ACETAMINOPHEN 650 MG RE SUPP
650.0000 mg | RECTAL | Status: DC | PRN
  Administered 2018-01-04: 650 mg via RECTAL
  Filled 2018-01-04 (×2): qty 1

## 2018-01-04 MED ORDER — CLEVIDIPINE BUTYRATE 0.5 MG/ML IV EMUL
0.0000 mg/h | INTRAVENOUS | Status: DC
Start: 2018-01-04 — End: 2018-01-06
  Administered 2018-01-04: 8 mg/h via INTRAVENOUS
  Administered 2018-01-04: 4 mg/h via INTRAVENOUS
  Administered 2018-01-05: 1 mg/h via INTRAVENOUS
  Filled 2018-01-04 (×2): qty 50

## 2018-01-04 MED ORDER — NICARDIPINE HCL IN NACL 20-0.86 MG/200ML-% IV SOLN
INTRAVENOUS | Status: AC
  Administered 2018-01-04: 20 mg
  Filled 2018-01-04: qty 200

## 2018-01-04 NOTE — Progress Notes (Signed)
Pharmacist Code Stroke Response  Notified to mix tPA at 1415 by Dr. Leonel Ramsay  Delivered tPA to RN at 1419  Issues/delays encountered (if applicable): Trying to obtain an accurate blood pressure  Jlee Harkless, Rande Lawman 01/04/18 2:24 PM

## 2018-01-04 NOTE — Consult Note (Signed)
PULMONARY / CRITICAL CARE MEDICINE   Name: Sheila [name] MRN: [id number] DOB: 30-Jun-1951    ADMISSION DATE:  01/04/2018 CONSULTATION DATE:  01/04/2018  REFERRING MD:  Leonel Ramsay   CHIEF COMPLAINT:  CVA  HISTORY OF PRESENT ILLNESS:   HPI obtained from medical chart review as patient is acutely encephalopathic.    67 year old female past medical history as below significant for but not limited to to prior strokes with residual right foot drop, diabetes, arthritis and hyperlipidemia who presented with altered mental status.  Patient is a resident at [facility]. Witnessed by staff to have acute change in her mental status today and unable to move left arm or leg and therefore was spinning in her wheelchair;  LKW at 1130 am.  Presented via EMS as a code stroke.  In the ER, patient noted to have left facial droop, left hemineglect, left hemianopia, and confusion.  Head CT with negative for acute findings, showing an old left thalamic infarct.  TPA was administered at 1424.  Following TPA, patient severely agitated with change in speech, and hypertension prompting stat EEG and repeat head CT confirming acute IVH.  Developed hives after IV contrast for CTA treated with benadryl and solumedrol. Cryoprecipitate and tranexamic acid given.  PCCM consulted for concern for airway protection given acute neurologic changes.    PAST MEDICAL HISTORY :  She  has a past medical history of Arthritis, Diabetes mellitus, Hyperlipidemia, and Stroke ([place]) (2015).  PAST SURGICAL HISTORY: She  has a past surgical history that includes Cholecystectomy and Tonsillectomy.  Allergies  Allergen Reactions  . Sulfa Antibiotics Other (See Comments)    As noted on MAR  . Paxil [Paroxetine Hcl] Other (See Comments)    Dreams/ nightmares    No current facility-administered medications on file prior to encounter.    Current Outpatient Medications on File Prior to Encounter  Medication Sig  . atorvastatin  (LIPITOR) 40 MG tablet Take 1 tablet (40 mg total) by mouth daily at 6 PM.  . Insulin Pen Needle 31G X 6 MM MISC Use as needed to inject insulin in the skin.  Marland Kitchen clopidogrel (PLAVIX) 75 MG tablet Take 1 tablet (75 mg total) by mouth daily.  . Dulaglutide (TRULICITY) 0.10 UV/2.5DG SOPN INJECT 0.75 MG INTO THE SKIN SUBCUTANEOUSLY ONCE WEEKLY  . fish oil-omega-3 fatty acids 1000 MG capsule Take 1 g by mouth daily.  Marland Kitchen gabapentin (NEURONTIN) 300 MG capsule Take 1 capsule by mouth at  bedtime (Patient taking differently: Take 300mg  capsule by mouth at  bedtime)  . Insulin Glargine (LANTUS SOLOSTAR) 100 UNIT/ML Solostar Pen Inject 30 Units into the skin daily. Follow-up appt was due back in June must see provider for future refills  . LORazepam (ATIVAN) 0.5 MG tablet TAKE ONE TABLET BY MOUTH TWICE DAILY AS NEEDED FOR ANXIETY  . meloxicam (MOBIC) 15 MG tablet Take 15 mg by mouth daily.  . metFORMIN (GLUCOPHAGE) 500 MG tablet Take 1 tablet (500 mg total) by mouth 2 (two) times daily with a meal. (Patient taking differently: Take 500-1,000 mg by mouth 2 (two) times daily with a meal. Per pharmacy records- pt takes 1,000 mg in the morning. 500 mg at bedtime .last fill date; 01-02-18 for 30 day supply)  . Multiple Vitamin (DAILY-VITE) TABS Take 1 tablet by mouth every morning.  Marland Kitchen QUEtiapine (SEROQUEL) 25 MG tablet Take 1 tablet (25 mg total) by mouth at bedtime.  . sertraline (ZOLOFT) 100 MG tablet Take 1 tablet by mouth  daily (Patient taking differently: Take 100 mg by mouth once a day)    FAMILY HISTORY:  Her indicated that her mother is deceased. She indicated that her father is deceased. She indicated that her brother is alive. She indicated that the status of her son is unknown.   SOCIAL HISTORY: She  reports that she quit smoking about 25 years ago. Her smoking use included cigarettes. she has never used smokeless tobacco. She reports that she does not drink alcohol or use drugs.  REVIEW OF SYSTEMS:    Unable to assess.    SUBJECTIVE:  TXA given in ER On cleviprex 4mg /hr Cryo infusing   VITAL SIGNS: BP (!) 220/172   Pulse (!) 106   Temp (!) 102.2 F (39 C) (Rectal)   Resp (!) 21   Wt 187 lb 13.3 oz (85.2 kg)   SpO2 98%   BMI 34.35 kg/m   HEMODYNAMICS:    VENTILATOR SETTINGS:    INTAKE / OUTPUT: No intake/output data recorded.  PHYSICAL EXAMINATION: General: Acute on chronically ill elderly female obtunded in bed HEENT: lips dry, right gaze, pupils 4/=/reactive, arcus senilis, will not let open mouth Neuro: obtunded, will arouse minimally to verbal, attempts to localize, moving extremities spontaneously upper greater than lower CV: ST, no m/r/g PULM: no acute distress, regular, lungs clear, diminished at bases GI: obese, soft,  bs active, purwick in place Extremities: warm/dry, +2 BLE, left hand with bruising, left anterior foot and lateral ankle warm with erythema Skin: very mild generalized resolving hives    LABS:  BMET Recent Labs  Lab 01/04/18 1356 01/04/18 1407  NA 141 141  K 5.3* 5.2*  CL 108 106  CO2 25  --   BUN 9 13  CREATININE 0.98 0.90  GLUCOSE 100* 99    Electrolytes Recent Labs  Lab 01/04/18 1356  CALCIUM 9.5    CBC Recent Labs  Lab 01/04/18 1356 01/04/18 1407  WBC 9.6  --   HGB 12.5 12.6  HCT 36.9 37.0  PLT 259  --     Coag's Recent Labs  Lab 01/04/18 1356  APTT 28  INR 0.97    Sepsis Markers No results for input(s): LATICACIDVEN, PROCALCITON, O2SATVEN in the last 168 hours.  ABG No results for input(s): PHART, PCO2ART, PO2ART in the last 168 hours.  Liver Enzymes Recent Labs  Lab 01/04/18 1356  AST 42*  ALT 21  ALKPHOS 117  BILITOT 1.4*  ALBUMIN 3.8    Cardiac Enzymes No results for input(s): TROPONINI, PROBNP in the last 168 hours.  Glucose Recent Labs  Lab 01/04/18 1359  GLUCAP 96    Imaging Ct Angio Head W Or Wo Contrast  Result Date: 01/04/2018 CLINICAL DATA:  Code stroke.  Left facial  droop. EXAM: CT ANGIOGRAPHY HEAD AND NECK TECHNIQUE: Multidetector CT imaging of the head and neck was performed using the standard protocol during bolus administration of intravenous contrast. Multiplanar CT image reconstructions and MIPs were obtained to evaluate the vascular anatomy. Carotid stenosis measurements (when applicable) are obtained utilizing NASCET criteria, using the distal internal carotid diameter as the denominator. CONTRAST:  50 cc Isovue 370 COMPARISON:  CT earlier same day.  Multiple examinations 2017. FINDINGS: CTA NECK FINDINGS Aortic arch: No aneurysm or dissection. Mild atherosclerotic plaque without calcification. Branching pattern of the brachiocephalic vessels is normal. Atherosclerotic calcification of the proximal left subclavian artery with tortuosity. 50% stenosis at the origin of that vessel. Right carotid system: Common carotid artery widely patent to the  bifurcation. Calcified plaque in the ICA bulb. No stenosis when compared to the more distal cervical ICA diameter of 3.5-4 mm. Left carotid system: Common carotid artery widely patent to the bifurcation. No atherosclerotic plaque at the bifurcation or ICA bulb. Cervical ICA widely patent, diameter 3.5-4 mm. Vertebral arteries: Both vertebral artery origins are widely patent. Both vertebral arteries are widely patent through the cervical region to the foramen magnum. Skeleton: Ordinary mid cervical spondylosis. Other neck: No mass or lymphadenopathy. Upper chest: Negative Review of the MIP images confirms the above findings CTA HEAD FINDINGS Anterior circulation: There is extensive atherosclerotic calcification in the carotid siphon region on each side. No stenosis greater than 30-50% is suspected. Supraclinoid internal carotid arteries are widely patent. The anterior and middle cerebral vessels are patent without proximal stenosis, aneurysm or vascular malformation. No missing vessels identified. Posterior circulation: Both  vertebral arteries widely patent through the foramen magnum to the basilar. Posterior inferior cerebellar arteries show flow. No basilar stenosis. Superior cerebellar and posterior cerebral arteries appear widely patent. Venous sinuses: Patent and normal. Anatomic variants: None significant Delayed phase: No abnormal enhancement Review of the MIP images confirms the above findings IMPRESSION: No large or medium vessel occlusion. No significant carotid bifurcation disease. No stenosis on the left. Focal calcified plaque at the distal bulb on the right but without stenosis when compared to the more distal cervical ICA. 50% stenosis the left subclavian artery just beyond the origin. Peripheral calcification throughout both carotid siphon regions. Generalized narrowing throughout the siphon regions, with stenosis estimated in the 30-50% range. These results were communicated to Dr. Leonel Ramsay At 2:43 pmon 1/9/2019by text page via the [facility] messaging system. Electronically Signed   By: Nelson Chimes M.D.   On: 01/04/2018 14:45   Ct Head Wo Contrast  Result Date: 01/04/2018 CLINICAL DATA:  Change in status after tPA.  Stroke. EXAM: CT HEAD WITHOUT CONTRAST TECHNIQUE: Contiguous axial images were obtained from the base of the skull through the vertex without intravenous contrast. COMPARISON:  Head CT and CTA from earlier today FINDINGS: Brain: New intraventricular hemorrhage layering in the occipital horn of the left lateral ventricle. Stable ventricular volume that is enlarged due to atrophy. No detected gray matter infarct. There is advanced presumed chronic small vessel ischemia in the white matter with volume loss. Remote lacunar infarct in the left thalamus. Vascular: Atherosclerotic calcification.  No hyperdense vessel. Skull: Negative Sinuses/Orbits: Gaze to the right.  History of neglect. Other: Critical Value/emergent results were called by telephone at the time of interpretation on 01/04/2018 at 4:18 pm to Dr.  Roland Rack , who verbally acknowledged these results. IMPRESSION: 1. New intraventricular hemorrhage layering in the occipital horn on the left. No hydrocephalus. 2. No visible acute gray matter infarct. 3. Severe white matter disease with volume loss. Remote lacunar infarct in the left thalamus. Electronically Signed   By: Monte Fantasia M.D.   On: 01/04/2018 16:21   Ct Angio Neck W Or Wo Contrast  Result Date: 01/04/2018 CLINICAL DATA:  Code stroke.  Left facial droop. EXAM: CT ANGIOGRAPHY HEAD AND NECK TECHNIQUE: Multidetector CT imaging of the head and neck was performed using the standard protocol during bolus administration of intravenous contrast. Multiplanar CT image reconstructions and MIPs were obtained to evaluate the vascular anatomy. Carotid stenosis measurements (when applicable) are obtained utilizing NASCET criteria, using the distal internal carotid diameter as the denominator. CONTRAST:  50 cc Isovue 370 COMPARISON:  CT earlier same day.  Multiple examinations 2017. FINDINGS:  CTA NECK FINDINGS Aortic arch: No aneurysm or dissection. Mild atherosclerotic plaque without calcification. Branching pattern of the brachiocephalic vessels is normal. Atherosclerotic calcification of the proximal left subclavian artery with tortuosity. 50% stenosis at the origin of that vessel. Right carotid system: Common carotid artery widely patent to the bifurcation. Calcified plaque in the ICA bulb. No stenosis when compared to the more distal cervical ICA diameter of 3.5-4 mm. Left carotid system: Common carotid artery widely patent to the bifurcation. No atherosclerotic plaque at the bifurcation or ICA bulb. Cervical ICA widely patent, diameter 3.5-4 mm. Vertebral arteries: Both vertebral artery origins are widely patent. Both vertebral arteries are widely patent through the cervical region to the foramen magnum. Skeleton: Ordinary mid cervical spondylosis. Other neck: No mass or lymphadenopathy. Upper  chest: Negative Review of the MIP images confirms the above findings CTA HEAD FINDINGS Anterior circulation: There is extensive atherosclerotic calcification in the carotid siphon region on each side. No stenosis greater than 30-50% is suspected. Supraclinoid internal carotid arteries are widely patent. The anterior and middle cerebral vessels are patent without proximal stenosis, aneurysm or vascular malformation. No missing vessels identified. Posterior circulation: Both vertebral arteries widely patent through the foramen magnum to the basilar. Posterior inferior cerebellar arteries show flow. No basilar stenosis. Superior cerebellar and posterior cerebral arteries appear widely patent. Venous sinuses: Patent and normal. Anatomic variants: None significant Delayed phase: No abnormal enhancement Review of the MIP images confirms the above findings IMPRESSION: No large or medium vessel occlusion. No significant carotid bifurcation disease. No stenosis on the left. Focal calcified plaque at the distal bulb on the right but without stenosis when compared to the more distal cervical ICA. 50% stenosis the left subclavian artery just beyond the origin. Peripheral calcification throughout both carotid siphon regions. Generalized narrowing throughout the siphon regions, with stenosis estimated in the 30-50% range. These results were communicated to Dr. Leonel Ramsay At 2:43 pmon 1/9/2019by text page via the [facility] messaging system. Electronically Signed   By: Nelson Chimes M.D.   On: 01/04/2018 14:45   Ct Head Code Stroke Wo Contrast  Result Date: 01/04/2018 CLINICAL DATA:  Code stroke. Left-sided facial droop. Last seen normal 3-1/2 hours ago. EXAM: CT HEAD WITHOUT CONTRAST TECHNIQUE: Contiguous axial images were obtained from the base of the skull through the vertex without intravenous contrast. COMPARISON:  11/24/2016 CT.  02/17/2016 MRI. FINDINGS: Brain: Generalized atrophy. Advanced widespread chronic small-vessel  ischemic changes the hemispheric white matter. Old infarction left thalamus. No sign of acute infarction, mass lesion, hemorrhage, hydrocephalus or extra-axial collection. Vascular: There is atherosclerotic calcification of the major vessels at the base of the brain. Skull: Negative Sinuses/Orbits: Clear/normal Other: None ASPECTS ([place] Stroke Program Early CT Score) - Ganglionic level infarction (caudate, lentiform nuclei, internal capsule, insula, M1-M3 cortex): 7 - Supraganglionic infarction (M4-M6 cortex): 3 Total score (0-10 with 10 being normal): 10 IMPRESSION: 1. No acute finding. Advanced chronic small-vessel ischemic changes throughout the hemispheric white matter. Old left thalamic infarction. 2. ASPECTS is 10. 3. These results were communicated to Dr. Leonel Ramsay At 2:12 pmon 1/9/2019by text page via the [facility] messaging system. Electronically Signed   By: Nelson Chimes M.D.   On: 01/04/2018 14:14   STUDIES:  1/9 Code stroke head CT >> 1. No acute finding. Advanced chronic small-vessel ischemic changes throughout the hemispheric white matter. Old left thalamic infarction. 2. ASPECTS is 10.  1/9 CTA head/ neck >> 1.  No large or medium vessel occlusion. 2.  No significant carotid  bifurcation disease. No stenosis on the left. Focal calcified plaque at the distal bulb on the right but without stenosis when compared to the more distal cervical ICA. 3.  50% stenosis the left subclavian artery just beyond the origin. 4.  Peripheral calcification throughout both carotid siphon regions. Generalized narrowing throughout the siphon regions, with stenosis estimated in the 30-50% range.  1/9 CT head >> 1. New intraventricular hemorrhage layering in the occipital horn on the left. No hydrocephalus. 2. No visible acute gray matter infarct. 3. Severe white matter disease with volume loss. Remote lacunar infarct in the left thalamus.  CULTURES: 1/9 MRSA PCR  >>  ANTIBIOTICS: none  SIGNIFICANT EVENTS: 1/9 Admit  LINES/TUBES: PIV x 2  DISCUSSION: 34 yof SNF resident with prior 2 CVA, LKW 1130 presenting with left sided deficits, initial head CT neg, given TPA with development in acute agitation s/p ativan, speech, and hypertension with repeat head CT showing acute IVH.  Given cryo and TXA.  PCCM consulted for airway management.  ASSESSMENT / PLAN:  PULMONARY A: High risk for intubation given acute IVH - currently maintaining oxygenation on 2L [place], no desaturations - s/p 2mg  ativan at 1633 P:   Monitor in ICU Supplemental O2 for sats > 94% high risk for intubation given AMS, currently protecting airway Aspiration precautions Will need SLP when appropriate   CARDIOVASCULAR A:  Hypertensive crisis  Acute CVA Hx HLD  - failed BP goals on labetalol and cardene, changed to cleviprex  - troponin neg P:  Tele monitoring cleviprex for SBP < 160 > 120 TTE, carotid dopplers, lipid panel pending  RENAL A:   Mild hyperkalemia  P:   Trend BMP / urinary output/ daily weight  Replace electrolytes as indicated  GASTROINTESTINAL A:   NPO with AMS P:   Continue PPI  Swallow eval  Repeat LFTs in am  HEMATOLOGIC A:   S/p TPA f/b TXA and cryo for acute IVH Acute allergic reaction to IV contrast P:  Trend CBC  Monitor for further bleeding S/p benadryl and solumedrol- continue solumedrol 40mg  q 8 hr x 3, caution for benadryl given current mental status, address on prn basis   INFECTIOUS A:   No acute concerns P:   Monitor fever curve/ trend WBC   ENDOCRINE A:   DM P:   CBG q 4  HgbA1c pending  NEUROLOGIC A:   Acute CVA s/p TPA with hemorraghic transformation s/p TXA and cryo Acute encephalopathy- related to ativan and concern for progressing IVH, monitor development of hydrocephalus  Concern for seizures Hx 2 prior CVA's (most recent 2017); wheelchair bound at SNF at baseline, arthritis P:   Per Neuro Repeat Head  CT planned for this evening per Neuro Neuro checks per protocol- frequent BP goals as above EEG pending Seizure precautions MRA/ MRI when able   FAMILY  - Updates: No family at bedside.  Neuro attempted to call brother, unable to reach.   - Inter-disciplinary family meet or Palliative Care meeting due by:  1/16  CCT 45 mins  Kennieth Rad, AGACNP-BC [place] Pgr: [phone] or if no answer [phone] 01/04/2018, 6:52 PM

## 2018-01-04 NOTE — Progress Notes (Signed)
Pharmacy Antibiotic Note  Sheila [name] is a 67 y.o. female admitted on 01/04/2018 with meningitis.  Pharmacy has been consulted for ceftriaxone, ampicillin, vancomycin, acyclovir dosing.  Plan: Vancomycin 2000 mg IV x 1 as loading dose, followed by  Vancomycin 1000 IV every 12 hours.  Goal trough 15-20 mcg/mL. Acyclovir 10 mg/kg (655 mg) IV q 8 hours (using adjBW 65.5 kg) Ceftriaxone 2 gram IV q 12 hours Ampicillin 2 grams IV q 6 hours  Weight: 187 lb 13.3 oz (85.2 kg)  Temp (24hrs), Avg:101.9 F (38.8 C), Min:101.7 F (38.7 C), Max:102.2 F (39 C)  Recent Labs  Lab 01/04/18 1356 01/04/18 1407  WBC 9.6  --   CREATININE 0.98 0.90    CrCl cannot be calculated (Unknown ideal weight.).    Allergies  Allergen Reactions  . Sulfa Antibiotics Other (See Comments)    As noted on MAR  . Iopamidol     rash  . Paxil [Paroxetine Hcl] Other (See Comments)    Dreams/ nightmares    Antimicrobials this admission: 1/9 Vancomycin >>  1/9 Acyclovir >>  1/9 Ceftriaxone >> 1/9 Ampicillin >>  Dose adjustments this admission: n/a  Microbiology results: 1/9 BCx: sent  1/9 MRSA PCR: sent   Thank you for allowing [place] to participate in this patients care.  Jens Som, PharmD Clinical phone for 01/04/2018: [phone] If after 10:30p, please call main pharmacy at: [phone] 01/04/2018 8:42 PM

## 2018-01-04 NOTE — Procedures (Signed)
History: 67 year old female with left-sided weakness  Sedation: Ativan given earlier  Technique: This is a 21 channel routine scalp EEG performed at the bedside with bipolar and monopolar montages arranged in accordance to the international 10/20 system of electrode placement. One channel was dedicated to EKG recording.    Background: The background is markedly obscured by muscle artifact.  During the brief times where background can be seen there is a posterior dominant rhythm of 8 Hz that is seen on the left better than the right there is also generalized irregular slow activity.  Comparison between the 2 sides difficult due to the degree of artifact and any asymmetry including in the posterior dominant rhythm is not definite.  There is no epileptiform discharge, periodic discharge or other concerning finding during the periods observed.  Photic stimulation: Physiologic driving is not performed  EEG Abnormalities: 1) possible asymmetric PDR, though I do not think this is definite by the study. 2) generalized irregular slow activity  Clinical Interpretation: This is a limited EEG is consistent with a generalized nonspecific cerebral dysfunction, with possible superimposed right hemispheric dysfunction.    There was no seizure or seizure predisposition recorded on this study. Please note that a normal EEG does not preclude the possibility of epilepsy.   Roland Rack, MD [facility] [phone]  If 7pm- 7am, please page neurology on call as listed in [place].

## 2018-01-04 NOTE — Progress Notes (Signed)
Cross cover note  Patient had repeat CTH. Increased IVH with no hydro. SBP 120-130s on Cleviprex. Exam essentially unchanged from prior exam with exception of decrease in level of arousal, but is able to open eyes to voice. Not following commands. Not agitated. D/W NSGY on call over phone - no neurosurgical intervention indicated at this time. Repeat CTH at 0400 hrs.  -- Amie Portland, MD [facility] Pager: [phone] If 7pm to 7am, please call on call as listed on [facility].

## 2018-01-04 NOTE — Progress Notes (Addendum)
Patient with high fever, mental status changes on arrival. Meningitis may need to be considered as well. I will order empiric antibiotics. If MRI does not give explanation for symptoms, may need LP(cannot do now due to TPA), otherwise could likely d/c antibiotics.  Roland Rack, MD [facility] [phone]  If 7pm- 7am, please page neurology on call as listed in [place].

## 2018-01-04 NOTE — H&P (Signed)
Admission H&P    Chief Complaint:  HPI: Sheila [name] is an 67 y.o. female with a history of 2 previous strokes the most recent of which was a left thalamic infarct in February 2017 with residual right foot drop. At that time she was seen by Dr. Jaynee Eagles and switched from aspirin to Plavix. Prior to that she had a right CVA details not available. Other medical problems include hyperlipidemia, depression, history of low back pain, diabetes mellitus with neuropathy, peripheral arterial disease, and arthritis.   The patient is currently a resident at the [facility], a nursing facility. Today at 11:30 AM she pulled her wheelchair away from the table and began to spin in circles. The patient was evaluated by a nurse, EMS was summoned, and a code stroke was initiated. An initial head CT at [facility] today showed no acute findings. The old left thalamic infarct was noted as well as advanced chronic small vessel ischemic changes. Dr. Leonel Ramsay was unable to contact the patient's family. A decision was made to proceed with IV TPA which was given at 1424.   LSN: 01/04/2018 at 11:30 AM tPA Given: IV TPA was given at 1424 on 01/04/2018.  Past Medical History:  Diagnosis Date  . Arthritis   . Diabetes mellitus    complicated by peripheral neuropathy  . Hyperlipidemia   . Stroke [facility]) 2015   residual right foot drop    Past Surgical History:  Procedure Laterality Date  . CHOLECYSTECTOMY    . TONSILLECTOMY      Family History  Problem Relation Age of Onset  . Diabetes Mother        Deceased  . Diabetes Brother   . Healthy Son    Social History:  reports that she quit smoking about 25 years ago. Her smoking use included cigarettes. she has never used smokeless tobacco. She reports that she does not drink alcohol or use drugs.  Allergies:  Allergies  Allergen Reactions  . Sulfa Antibiotics Other (See Comments)    As noted on MAR  . Paxil [Paroxetine Hcl] Other (See  Comments)    Dreams/ nightmares     (Not in a hospital admission)  ROS: Unable to obtain at this time secondary to aphasia.   Physical Examination: Vitals:   01/04/18 1300  Weight: 187 lb 13.3 oz (85.2 kg)    Laboratory Studies:   Basic Metabolic Panel: Recent Labs  Lab 01/04/18 1407  NA 141  K 5.2*  CL 106  GLUCOSE 99  BUN 13  CREATININE 0.90    Liver Function Tests: No results for input(s): AST, ALT, ALKPHOS, BILITOT, PROT, ALBUMIN in the last 168 hours. No results for input(s): LIPASE, AMYLASE in the last 168 hours. No results for input(s): AMMONIA in the last 168 hours.  CBC: Recent Labs  Lab 01/04/18 1356 01/04/18 1407  WBC 9.6  --   NEUTROABS 7.1  --   HGB 12.5 12.6  HCT 36.9 37.0  MCV 86.8  --   PLT 259  --     Cardiac Enzymes: No results for input(s): CKTOTAL, CKMB, CKMBINDEX, TROPONINI in the last 168 hours.  BNP: Invalid input(s): POCBNP  CBG: Recent Labs  Lab 01/04/18 1359  GLUCAP 96    Microbiology: Results for orders placed or performed during the hospital encounter of 05/05/17  MRSA PCR Screening     Status: None   Collection Time: 05/06/17 10:18 AM  Result Value Ref Range Status   MRSA  by PCR NEGATIVE NEGATIVE Final    Comment:        The GeneXpert MRSA Assay (FDA approved for NASAL specimens only), is one component of a comprehensive MRSA colonization surveillance program. It is not intended to diagnose MRSA infection nor to guide or monitor treatment for MRSA infections.     Coagulation Studies: Recent Labs    01/04/18 1356  LABPROT 12.8  INR 0.97    Urinalysis: No results for input(s): COLORURINE, LABSPEC, PHURINE, GLUCOSEU, HGBUR, BILIRUBINUR, KETONESUR, PROTEINUR, UROBILINOGEN, NITRITE, LEUKOCYTESUR in the last 168 hours.  Invalid input(s): APPERANCEUR  Lipid Panel:     Component Value Date/Time   CHOL 158 02/19/2016 0556   TRIG 165 (H) 02/19/2016 0556   HDL 33 (L) 02/19/2016 0556   CHOLHDL 4.8  02/19/2016 0556   VLDL 33 02/19/2016 0556   LDLCALC 92 02/19/2016 0556    HgbA1C:  Lab Results  Component Value Date   HGBA1C 5.7 12/31/2016    Urine Drug Screen:      Component Value Date/Time   LABOPIA NONE DETECTED 02/17/2016 2013   COCAINSCRNUR NONE DETECTED 02/17/2016 2013   LABBENZ NONE DETECTED 02/17/2016 2013   AMPHETMU NONE DETECTED 02/17/2016 2013   THCU NONE DETECTED 02/17/2016 2013   LABBARB NONE DETECTED 02/17/2016 2013    Alcohol Level:  Recent Labs  Lab 01/04/18 1356  ETH <10    Other results: EKG: pending  IMAGING  Ct Angio Head W Or Wo Contrast Ct Angio Neck W Or Wo Contrast 01/04/2018 IMPRESSION:  No large or medium vessel occlusion. No significant carotid bifurcation disease. No stenosis on the left. Focal calcified plaque at the distal bulb on the right but without stenosis when compared to the more distal cervical ICA. 50% stenosis the left subclavian artery just beyond the origin. Peripheral calcification throughout both carotid siphon regions. Generalized narrowing throughout the siphon regions, with stenosis estimated in the 30-50% range.    Ct Head Code Stroke Wo Contrast 01/04/2018 IMPRESSION:  1. No acute finding. Advanced chronic small-vessel ischemic changes throughout the hemispheric white matter. Old left thalamic infarction.  2. ASPECTS is 10.     Assessment: 67 y.o. female with history of 2 previous strokes the most recent of which was a left thalamic infarct in February 2017 with a residual right foot drop, hyperlipidemia, diabetes mellitus, peripheral arterial disease, and arthritis brought to [facility] today from her nursing facility as a code stroke.  IV TPA was given at 1424 on 01/04/2018.  Stroke Risk Factors - previous strokes, hyperlipidemia, diabetes mellitus, and peripheral arterial disease.  Plan: 1. HgbA1c, fasting lipid panel 2. MRI, MRA  of the brain without contrast 3. PT consult, OT consult, Speech  consult 4. Echocardiogram 5. Carotid dopplers 6. Prophylactic therapy- no antiplatelet or anticoagulation for 24 hours secondary to TPA therapy. 7. Risk factor modification 8. Telemetry monitoring 9. Frequent neuro checks  Further assessment and plan to follow per Dr. Anthony Sar Rinehuls PA-C [facility] Pager [phone] 01/04/2018, 3:01 PM  Per the nursing tech who witnessed the event.  The patient became suddenly agitated, put her right arm to her head and pushed back from the table that she was sitting at.  At that point, the tach realize she was only pushing with her right leg and therefore was "spinning in circles" as she was trying to push away.  She was not moving the left arm or leg therefore EMS was called.  En route, the left  arm and leg weakness improved, however she was still suffering from a left facial droop, severe left hemineglect, left hemianopia, as well as severe confusion.  Given this picture, she was given IV TPA. Family was not present so was administered with emergency consent after consultation with ER physician.   Following IV tPA, the patient remained quite agitated, and this plus her paucity of speech prompted a stat EEG.   Physical exam:  Physical Exam  Constitutional: Appears well-developed and well-nourished.  Psych: Anxious, psychomotor agitation.  Eyes: No scleral injection HENT: No OP obstrucion Head: Normocephalic.  Cardiovascular: Normal rate and regular rhythm.  Respiratory: Effort normal, non-labored breathing GI: Soft.  No distension. There is no tenderness.  Skin: WDI  Neuro: Mental Status: Patient is somnolent but arousable. She tells me her name and follows commands, She doe snot answer many questions and appears intensely anxious.  Cranial Nerves: II: Does not blink to threat from the left. Does from the right. PERRL  III,IV, VI: R gaze deviation.  V: VII: blinks to eyelid stimulaiton bilaterally.  XII: tongue is  midline without atrophy or fasciculations.  Motor: She moves all extremities with good strength, but appears to neglect the left side.  Sensory: She flinches to pain in the left, but does not localize, seems to think pain in her left arm is coming from her face.  Cerebellar: Does not perform, but no clear ataxia.    This patient is critically ill and at significant risk of neurological worsening, death and care requires constant monitoring of vital signs, hemodynamics,respiratory and cardiac monitoring, neurological assessment, discussion with family, other specialists and medical decision making of high complexity. I spent 50 minutes of neurocritical care time  in the care of  this patient.  Roland Rack, MD [facility] [phone]  If 7pm- 7am, please page neurology on call as listed in [place]. 01/04/2018  4:30 PM

## 2018-01-04 NOTE — Code Documentation (Signed)
67yo female arriving to [facility] at 17 via [place].  Patient from [place] facility where she was reported to be LKW at 1130.  Staff contacted by neurologist via telephone and report patient was sitting at a table in her wheelchair when staff noticed left facial droop and difficulty using her left side.  Patient presented to the ED as a code stroke which was activated by EMS.  Patient with h/o bilateral strokes.  Stroke team to the bedside on patient arrival.  Labs drawn but requiring 2 attempts which led to short delay in transport to CT.  Patient to CT with team.  PIV placed x 2.  CT completed.  Patient requiring Ativan IVP to complete CTA head and neck.  NIHSS 21, see documentation for details and code stroke times.  Patient with right gaze, left facial droop, left hemianopia and aphasia on exam.  Patient able to follow some commands.  Pharmacy at the bedside and notified to mix tPA.  BP 154/92.  8mg  tPA bolus given at 1424 followed by 69mg /hr for a total of 77mg  per pharmacy dosing.  Slight delay in tPA administration d/t obtaining accurate history/LKW and BP.  Patient to E39.  Patient with SBP 111 and order to give NS bolus and infusion.  On reassessment patient with hives to right neck and shoulder.  tPA stopped, 6mg  remaining.  Dr. Leonel Ramsay made aware.  Order for Benadryl and Solumedrol IV.  Patient noted to become hypertensive, reassessed.  Patient restless, less responsive with difficulty following commands.  Dr. Leonel Ramsay made aware.  Order for Ativan 2mg  IVP and then to reassess BP.  Ativan given and patient remained hypertensive.  Dr. Leonel Ramsay made aware of continued hypertension, order for Labetalol 20mg  IVP followed by Cardene gtt and STAT CT head.  Labetalol given and Cardene started and SBP now 130s.  Dr. Leonel Ramsay to the bedside and Cardene stopped per MD.  CT showing intraventricular hemorrhage.  SBP goal now 140.  Patient back to ED.  TXA and cryo ordered.  TXA delivered by  pharmacy and administered.  EEG at the bedside.  Cardene titrated to max of 40m/hr without meeting BP goal.  Dr. Leonel Ramsay aware.  Order to give Labetalol 20mg  IVP and switch to Cleviprex gtt.  Labetalol given and Cleviprex gtt started and titrated.  Report given to 4N ICU RN Jerene Pitch.  4N staff to retrieve cyro to administer on arrival to ICU.  Patient transferred to 4N26.  Cryo started by 4N staff.  Dr. Leonel Ramsay notified of BP, neuro exam and fever.  Order to keep SBP <160.  CCM to be consulted. Bedside handoff with staff.

## 2018-01-04 NOTE — Progress Notes (Signed)
EEG completed, results pending. 

## 2018-01-04 NOTE — Progress Notes (Signed)
BP spiked following severe agitation, ativan given to confirm BP, following this it remained elevated and CT was obtained showing IVH. Giving cryoprecipitate and tranexamic acid.  Roland Rack, MD [facility] [phone]  If 7pm- 7am, please page neurology on call as listed in [place].

## 2018-01-04 NOTE — ED Provider Notes (Signed)
[facility] EMERGENCY DEPARTMENT Provider Note   CSN: [id number] Arrival date & time: 01/04/18  1349     History   Chief Complaint No chief complaint on file.   HPI Roxan Yamamoto Nakya Weyand is a 67 y.o. female.  HPI  A LEVEL 5 CAVEAT PERTAINS DUE TO ALTERED MENTAL STATUS/URGENT NEED FOR INTERVENTION.    Patient presents as a code stroke notification via EMS.  Per report she was at her nursing facility earlier today and had a change in her mental status.  She was spinning in her wheelchair.    Past Medical History:  Diagnosis Date  . Arthritis   . Diabetes mellitus    complicated by peripheral neuropathy  . Hyperlipidemia   . Stroke [facility]) 2015   residual right foot drop    Patient Active Problem List   Diagnosis Date Noted  . Lethargic 05/05/2017  . Low blood sugar 05/05/2017  . Nail abnormalities 12/31/2016  . Memory changes 09/16/2016  . Multiple falls 09/16/2016  . UTI (urinary tract infection) 02/28/2016  . Depression 02/28/2016  . Acute ischemic VBA thalamic stroke ([place])   . Stroke (cerebrum) ([place])   . Anomia 02/17/2016  . Stroke ([place]) 02/17/2016  . Peripheral arterial disease ([place]) 12/03/2015  . Neuropathy 09/05/2015  . Medicare annual wellness visit, subsequent 03/12/2015  . Routine general medical examination at a health care facility 10/08/2014  . Cerumen impaction 10/08/2014  . Low back pain potentially associated with radiculopathy 03/01/2013  . Hyperlipidemia 02/15/2012  . Type 2 diabetes mellitus with neurologic complication ([place]) 48/18/5631    Past Surgical History:  Procedure Laterality Date  . CHOLECYSTECTOMY    . TONSILLECTOMY      OB History    No data available       Home Medications    Prior to Admission medications   Medication Sig Start Date End Date Taking? Authorizing Provider  atorvastatin (LIPITOR) 40 MG tablet Take 1 tablet (40 mg total) by mouth daily at 6 PM. 02/20/16  Yes Gherghe, Vella Redhead, MD  Insulin Pen  Needle 31G X 6 MM MISC Use as needed to inject insulin in the skin. 06/24/17  Yes Golden Circle, FNP  clopidogrel (PLAVIX) 75 MG tablet Take 1 tablet (75 mg total) by mouth daily. 02/20/16   Caren Griffins, MD  Dulaglutide (TRULICITY) 4.97 WY/6.3ZC SOPN INJECT 0.75 MG INTO THE SKIN SUBCUTANEOUSLY ONCE WEEKLY 06/02/17   Golden Circle, FNP  fish oil-omega-3 fatty acids 1000 MG capsule Take 1 g by mouth daily.    [provider]  gabapentin (NEURONTIN) 300 MG capsule Take 1 capsule by mouth at  bedtime Patient taking differently: Take 300mg  capsule by mouth at  bedtime 12/16/15   Golden Circle, FNP  Insulin Glargine (LANTUS SOLOSTAR) 100 UNIT/ML Solostar Pen Inject 30 Units into the skin daily. Follow-up appt was due back in June must see provider for future refills 08/22/17   Golden Circle, FNP  LORazepam (ATIVAN) 0.5 MG tablet TAKE ONE TABLET BY MOUTH TWICE DAILY AS NEEDED FOR ANXIETY 06/03/17   Golden Circle, FNP  meloxicam (MOBIC) 15 MG tablet Take 15 mg by mouth daily.    [provider]  metFORMIN (GLUCOPHAGE) 500 MG tablet Take 1 tablet (500 mg total) by mouth 2 (two) times daily with a meal. Patient taking differently: Take 500-1,000 mg by mouth 2 (two) times daily with a meal. Per pharmacy records- pt takes 1,000 mg in the morning. [place]  mg at bedtime .last fill date; 01-02-18 for 30 day supply 06/13/17   Golden Circle, FNP  Multiple Vitamin (DAILY-VITE) TABS Take 1 tablet by mouth every morning.    [provider]  QUEtiapine (SEROQUEL) 25 MG tablet Take 1 tablet (25 mg total) by mouth at bedtime. 06/13/17   Golden Circle, FNP  sertraline (ZOLOFT) 100 MG tablet Take 1 tablet by mouth  daily Patient taking differently: Take 100 mg by mouth once a day 01/08/16   Golden Circle, FNP    Family History Family History  Problem Relation Age of Onset  . Diabetes Mother        Deceased  . Diabetes Brother   . Healthy Son     Social  History Social History   Tobacco Use  . Smoking status: Former Smoker    Types: Cigarettes    Last attempt to quit: 02/15/1992    Years since quitting: 25.9  . Smokeless tobacco: Never Used  Substance Use Topics  . Alcohol use: No    Alcohol/week: 0.0 oz    Comment: Socially  . Drug use: No    Comment: SMOKES IT ANY TIME SHE CAN GET IT     Allergies   Sulfa antibiotics and Paxil [paroxetine hcl]   Review of Systems Review of Systems  UNABLE TO OBTAIN ROS DUE TO LEVEL 5 CAVEAT   Physical Exam Updated Vital Signs BP (!) 220/172   Pulse (!) 110   Resp (!) 21   Wt 85.2 kg (187 lb 13.3 oz)   SpO2 98%   BMI 34.35 kg/m  Vitals reviewed Physical Exam  Physical Examination: General appearance - alert, anxious appearing, nonverbal Mental status - alert, Eyes - pupils equal and reactive, extraocular eye movements intact, left gaze neglect Mouth - mucous membranes moist, pharynx normal without lesions Chest - clear to auscultation, no wheezes, rales or rhonchi, symmetric air entry Heart - normal rate, regular rhythm, normal S1, S2, no murmurs, rubs, clicks or gallops Neurological - alert but nonverbal, left sided neglect, moving all extremities, no blink to threat on left Extremities - peripheral pulses normal, no pedal edema, no clubbing or cyanosis Skin - normal coloration and turgor, no rashes,   ED Treatments / Results  Labs (all labs ordered are listed, but only abnormal results are displayed) Labs Reviewed  COMPREHENSIVE METABOLIC PANEL - Abnormal; Notable for the following components:      Result Value   Potassium 5.3 (*)    Glucose, Bld 100 (*)    AST 42 (*)    Total Bilirubin 1.4 (*)    GFR calc non Af Amer 58 (*)    All other components within normal limits  I-STAT CHEM 8, ED - Abnormal; Notable for the following components:   Potassium 5.2 (*)    Calcium, Ion 1.06 (*)    All other components within normal limits  ETHANOL  PROTIME-INR  APTT  CBC   DIFFERENTIAL  RAPID URINE DRUG SCREEN, HOSP PERFORMED  URINALYSIS, ROUTINE W REFLEX MICROSCOPIC  I-STAT TROPONIN, ED  CBG MONITORING, ED  PREPARE CRYOPRECIPITATE  ABO/RH    EKG  EKG Interpretation None       Radiology Ct Angio Head W Or Wo Contrast  Result Date: 01/04/2018 CLINICAL DATA:  Code stroke.  Left facial droop. EXAM: CT ANGIOGRAPHY HEAD AND NECK TECHNIQUE: Multidetector CT imaging of the head and neck was performed using the standard protocol during bolus administration of intravenous contrast. Multiplanar CT image reconstructions  and MIPs were obtained to evaluate the vascular anatomy. Carotid stenosis measurements (when applicable) are obtained utilizing NASCET criteria, using the distal internal carotid diameter as the denominator. CONTRAST:  50 cc Isovue 370 COMPARISON:  CT earlier same day.  Multiple examinations 2017. FINDINGS: CTA NECK FINDINGS Aortic arch: No aneurysm or dissection. Mild atherosclerotic plaque without calcification. Branching pattern of the brachiocephalic vessels is normal. Atherosclerotic calcification of the proximal left subclavian artery with tortuosity. 50% stenosis at the origin of that vessel. Right carotid system: Common carotid artery widely patent to the bifurcation. Calcified plaque in the ICA bulb. No stenosis when compared to the more distal cervical ICA diameter of 3.5-4 mm. Left carotid system: Common carotid artery widely patent to the bifurcation. No atherosclerotic plaque at the bifurcation or ICA bulb. Cervical ICA widely patent, diameter 3.5-4 mm. Vertebral arteries: Both vertebral artery origins are widely patent. Both vertebral arteries are widely patent through the cervical region to the foramen magnum. Skeleton: Ordinary mid cervical spondylosis. Other neck: No mass or lymphadenopathy. Upper chest: Negative Review of the MIP images confirms the above findings CTA HEAD FINDINGS Anterior circulation: There is extensive atherosclerotic  calcification in the carotid siphon region on each side. No stenosis greater than 30-50% is suspected. Supraclinoid internal carotid arteries are widely patent. The anterior and middle cerebral vessels are patent without proximal stenosis, aneurysm or vascular malformation. No missing vessels identified. Posterior circulation: Both vertebral arteries widely patent through the foramen magnum to the basilar. Posterior inferior cerebellar arteries show flow. No basilar stenosis. Superior cerebellar and posterior cerebral arteries appear widely patent. Venous sinuses: Patent and normal. Anatomic variants: None significant Delayed phase: No abnormal enhancement Review of the MIP images confirms the above findings IMPRESSION: No large or medium vessel occlusion. No significant carotid bifurcation disease. No stenosis on the left. Focal calcified plaque at the distal bulb on the right but without stenosis when compared to the more distal cervical ICA. 50% stenosis the left subclavian artery just beyond the origin. Peripheral calcification throughout both carotid siphon regions. Generalized narrowing throughout the siphon regions, with stenosis estimated in the 30-50% range. These results were communicated to Dr. Leonel Ramsay At 2:43 pmon 1/9/2019by text page via the [facility] messaging system. Electronically Signed   By: Nelson Chimes M.D.   On: 01/04/2018 14:45   Ct Head Wo Contrast  Result Date: 01/04/2018 CLINICAL DATA:  Change in status after tPA.  Stroke. EXAM: CT HEAD WITHOUT CONTRAST TECHNIQUE: Contiguous axial images were obtained from the base of the skull through the vertex without intravenous contrast. COMPARISON:  Head CT and CTA from earlier today FINDINGS: Brain: New intraventricular hemorrhage layering in the occipital horn of the left lateral ventricle. Stable ventricular volume that is enlarged due to atrophy. No detected gray matter infarct. There is advanced presumed chronic small vessel ischemia in the  white matter with volume loss. Remote lacunar infarct in the left thalamus. Vascular: Atherosclerotic calcification.  No hyperdense vessel. Skull: Negative Sinuses/Orbits: Gaze to the right.  History of neglect. Other: Critical Value/emergent results were called by telephone at the time of interpretation on 01/04/2018 at 4:18 pm to Dr. Roland Rack , who verbally acknowledged these results. IMPRESSION: 1. New intraventricular hemorrhage layering in the occipital horn on the left. No hydrocephalus. 2. No visible acute gray matter infarct. 3. Severe white matter disease with volume loss. Remote lacunar infarct in the left thalamus. Electronically Signed   By: Monte Fantasia M.D.   On: 01/04/2018 16:21   Ct  Angio Neck W Or Wo Contrast  Result Date: 01/04/2018 CLINICAL DATA:  Code stroke.  Left facial droop. EXAM: CT ANGIOGRAPHY HEAD AND NECK TECHNIQUE: Multidetector CT imaging of the head and neck was performed using the standard protocol during bolus administration of intravenous contrast. Multiplanar CT image reconstructions and MIPs were obtained to evaluate the vascular anatomy. Carotid stenosis measurements (when applicable) are obtained utilizing NASCET criteria, using the distal internal carotid diameter as the denominator. CONTRAST:  50 cc Isovue 370 COMPARISON:  CT earlier same day.  Multiple examinations 2017. FINDINGS: CTA NECK FINDINGS Aortic arch: No aneurysm or dissection. Mild atherosclerotic plaque without calcification. Branching pattern of the brachiocephalic vessels is normal. Atherosclerotic calcification of the proximal left subclavian artery with tortuosity. 50% stenosis at the origin of that vessel. Right carotid system: Common carotid artery widely patent to the bifurcation. Calcified plaque in the ICA bulb. No stenosis when compared to the more distal cervical ICA diameter of 3.5-4 mm. Left carotid system: Common carotid artery widely patent to the bifurcation. No atherosclerotic  plaque at the bifurcation or ICA bulb. Cervical ICA widely patent, diameter 3.5-4 mm. Vertebral arteries: Both vertebral artery origins are widely patent. Both vertebral arteries are widely patent through the cervical region to the foramen magnum. Skeleton: Ordinary mid cervical spondylosis. Other neck: No mass or lymphadenopathy. Upper chest: Negative Review of the MIP images confirms the above findings CTA HEAD FINDINGS Anterior circulation: There is extensive atherosclerotic calcification in the carotid siphon region on each side. No stenosis greater than 30-50% is suspected. Supraclinoid internal carotid arteries are widely patent. The anterior and middle cerebral vessels are patent without proximal stenosis, aneurysm or vascular malformation. No missing vessels identified. Posterior circulation: Both vertebral arteries widely patent through the foramen magnum to the basilar. Posterior inferior cerebellar arteries show flow. No basilar stenosis. Superior cerebellar and posterior cerebral arteries appear widely patent. Venous sinuses: Patent and normal. Anatomic variants: None significant Delayed phase: No abnormal enhancement Review of the MIP images confirms the above findings IMPRESSION: No large or medium vessel occlusion. No significant carotid bifurcation disease. No stenosis on the left. Focal calcified plaque at the distal bulb on the right but without stenosis when compared to the more distal cervical ICA. 50% stenosis the left subclavian artery just beyond the origin. Peripheral calcification throughout both carotid siphon regions. Generalized narrowing throughout the siphon regions, with stenosis estimated in the 30-50% range. These results were communicated to Dr. Leonel Ramsay At 2:43 pmon 1/9/2019by text page via the [facility] messaging system. Electronically Signed   By: Nelson Chimes M.D.   On: 01/04/2018 14:45   Ct Head Code Stroke Wo Contrast  Result Date: 01/04/2018 CLINICAL DATA:  Code stroke.  Left-sided facial droop. Last seen normal 3-1/2 hours ago. EXAM: CT HEAD WITHOUT CONTRAST TECHNIQUE: Contiguous axial images were obtained from the base of the skull through the vertex without intravenous contrast. COMPARISON:  11/24/2016 CT.  02/17/2016 MRI. FINDINGS: Brain: Generalized atrophy. Advanced widespread chronic small-vessel ischemic changes the hemispheric white matter. Old infarction left thalamus. No sign of acute infarction, mass lesion, hemorrhage, hydrocephalus or extra-axial collection. Vascular: There is atherosclerotic calcification of the major vessels at the base of the brain. Skull: Negative Sinuses/Orbits: Clear/normal Other: None ASPECTS ([place] Stroke Program Early CT Score) - Ganglionic level infarction (caudate, lentiform nuclei, internal capsule, insula, M1-M3 cortex): 7 - Supraganglionic infarction (M4-M6 cortex): 3 Total score (0-10 with 10 being normal): 10 IMPRESSION: 1. No acute finding. Advanced chronic small-vessel ischemic changes throughout the  hemispheric white matter. Old left thalamic infarction. 2. ASPECTS is 10. 3. These results were communicated to Dr. Leonel Ramsay At 2:12 pmon 1/9/2019by text page via the [facility] messaging system. Electronically Signed   By: Nelson Chimes M.D.   On: 01/04/2018 14:14   CRITICAL CARE Performed by: Marcha Dutton, MARTHA L Total critical care time: 35 minutes Critical care time was exclusive of separately billable procedures and treating other patients. Critical care was necessary to treat or prevent imminent or life-threatening deterioration. Critical care was time spent personally by me on the following activities: development of treatment plan with patient and/or surrogate as well as nursing, discussions with consultants, evaluation of patient's response to treatment, examination of patient, obtaining history from patient or surrogate, ordering and performing treatments and interventions, ordering and review of laboratory studies, ordering and  review of radiographic studies, pulse oximetry and re-evaluation of patient's condition.  Procedures Procedures (including critical care time)  Medications Ordered in ED Medications  iopamidol (ISOVUE-370) 76 % injection (not administered)  alteplase (ACTIVASE) 1 mg/mL infusion 77 mg (0 mg Intravenous Stopped 01/04/18 1520)    Followed by  0.9 %  sodium chloride infusion (not administered)  LORazepam (ATIVAN) 2 MG/ML injection (not administered)   stroke: mapping our early stages of recovery book (not administered)  0.9 %  sodium chloride infusion ( Intravenous New Bag/Given 01/04/18 1457)  acetaminophen (TYLENOL) tablet 650 mg (not administered)    Or  acetaminophen (TYLENOL) solution 650 mg (not administered)    Or  acetaminophen (TYLENOL) suppository 650 mg (not administered)  pantoprazole (PROTONIX) injection 40 mg (not administered)  diphenhydrAMINE (BENADRYL) 50 MG/ML injection (not administered)  methylPREDNISolone sodium succinate (SOLU-MEDROL) 125 mg/2 mL injection (not administered)  labetalol (NORMODYNE,TRANDATE) 5 MG/ML injection (not administered)  0.9 %  sodium chloride infusion (not administered)  nicardipine (CARDENE) 20mg  in 0.86% saline 280ml IV infusion (0.1 mg/ml) (15 mg/hr Intravenous Rate/Dose Change 01/04/18 1638)  methylPREDNISolone sodium succinate (SOLU-MEDROL) 125 mg/2 mL injection 125 mg (not administered)  labetalol (NORMODYNE,TRANDATE) 5 MG/ML injection (not administered)  clevidipine (CLEVIPREX) infusion 0.5 mg/mL (not administered)  LORazepam (ATIVAN) 2 MG/ML injection (2 mg  Given 01/04/18 1633)  niCARdipine in saline (CARDENE-IV) 20-0.86 MG/200ML-% infusion SOLN (20 mg  New Bag/Given 01/04/18 1610)  tranexamic acid (CYKLOKAPRON) 1,000 mg in sodium chloride 0.9 % 100 mL BOLUS (0 mg Intravenous Stopped 01/04/18 1641)  LORazepam (ATIVAN) injection 2 mg (2 mg Intravenous Given 01/04/18 1530)  diphenhydrAMINE (BENADRYL) injection 25 mg (25 mg Intravenous Given 01/04/18  1530)  labetalol (NORMODYNE,TRANDATE) injection 20 mg (20 mg Intravenous Given 01/04/18 1652)     Initial Impression / Assessment and Plan / ED Course  I have reviewed the triage vital signs and the nursing notes.  Pertinent labs & imaging results that were available during my care of the patient were reviewed by me and considered in my medical decision making (see chart for details).    2:20 PM pt seen emergently in CT with Dr. Leonel Ramsay, emergency consent given by both of [place] for TPA.  Onset 11:30am.  Unable to contact family and Dr. Leonel Ramsay has left a message with son.    Patient was seen by both myself and neurology emergently in radiology.  Decision based on acute onset of symptoms and pattern of symptoms was made to initiate TPA.  Patient will be admitted to neurology service to the ICU for further management.  During her ED course she has become hypertensive with small intravascular hemorrhage seen on repeat head CT.  Dr. Leonel Ramsay is managing reversal of her TPA.  He is also managing her blood pressure.  She has had an EEG which did not reveal any seizure activity.    Final Clinical Impressions(s) / ED Diagnoses   Final diagnoses:  Cerebrovascular accident (CVA), unspecified mechanism [facility])    ED Discharge Orders    None       Mabe, Forbes Cellar, MD 01/04/18 1659

## 2018-01-05 ENCOUNTER — Inpatient Hospital Stay (HOSPITAL_COMMUNITY): Payer: Medicare Other

## 2018-01-05 DIAGNOSIS — I169 Hypertensive crisis, unspecified: Secondary | ICD-10-CM

## 2018-01-05 DIAGNOSIS — I615 Nontraumatic intracerebral hemorrhage, intraventricular: Secondary | ICD-10-CM

## 2018-01-05 LAB — COMPREHENSIVE METABOLIC PANEL
ALBUMIN: 3.4 g/dL — AB (ref 3.5–5.0)
ALT: 25 U/L (ref 14–54)
AST: 47 U/L — AB (ref 15–41)
Alkaline Phosphatase: 84 U/L (ref 38–126)
Anion gap: 13 (ref 5–15)
BILIRUBIN TOTAL: 0.9 mg/dL (ref 0.3–1.2)
BUN: 13 mg/dL (ref 6–20)
CHLORIDE: 107 mmol/L (ref 101–111)
CO2: 21 mmol/L — ABNORMAL LOW (ref 22–32)
CREATININE: 1.11 mg/dL — AB (ref 0.44–1.00)
Calcium: 8.8 mg/dL — ABNORMAL LOW (ref 8.9–10.3)
GFR calc Af Amer: 58 mL/min — ABNORMAL LOW (ref >60)
GFR, EST NON AFRICAN AMERICAN: 50 mL/min — AB (ref >60)
GLUCOSE: 147 mg/dL — AB (ref 65–99)
POTASSIUM: 4.9 mmol/L (ref 3.5–5.1)
Sodium: 141 mmol/L (ref 135–145)
Total Protein: 6.1 g/dL — ABNORMAL LOW (ref 6.5–8.1)

## 2018-01-05 LAB — BPAM CRYOPRECIPITATE
BLOOD PRODUCT EXPIRATION DATE: 201901092225
BLOOD PRODUCT EXPIRATION DATE: 201901092225
ISSUE DATE / TIME: 201901091708
ISSUE DATE / TIME: 201901091708
UNIT TYPE AND RH: 5100
UNIT TYPE AND RH: 5100

## 2018-01-05 LAB — LIPID PANEL
CHOL/HDL RATIO: 2.4 ratio
CHOLESTEROL: 127 mg/dL (ref 0–200)
HDL: 52 mg/dL (ref >40)
LDL CALC: 57 mg/dL (ref 0–99)
Triglycerides: 91 mg/dL (ref <150)
VLDL: 18 mg/dL (ref 0–40)

## 2018-01-05 LAB — ECHOCARDIOGRAM COMPLETE
Height: 63 in
Weight: 3005.31 oz

## 2018-01-05 LAB — RAPID URINE DRUG SCREEN, HOSP PERFORMED
Amphetamines: NOT DETECTED
BARBITURATES: NOT DETECTED
Benzodiazepines: POSITIVE — AB
Cocaine: NOT DETECTED
Opiates: NOT DETECTED
Tetrahydrocannabinol: NOT DETECTED

## 2018-01-05 LAB — PREPARE CRYOPRECIPITATE
UNIT DIVISION: 0
Unit division: 0

## 2018-01-05 LAB — URINALYSIS, ROUTINE W REFLEX MICROSCOPIC
BILIRUBIN URINE: NEGATIVE
GLUCOSE, UA: 150 mg/dL — AB
Ketones, ur: NEGATIVE mg/dL
NITRITE: POSITIVE — AB
PROTEIN: NEGATIVE mg/dL
Specific Gravity, Urine: 1.013 (ref 1.005–1.030)
pH: 6 (ref 5.0–8.0)

## 2018-01-05 LAB — CBC
HEMATOCRIT: 31.9 % — AB (ref 36.0–46.0)
HEMOGLOBIN: 10.8 g/dL — AB (ref 12.0–15.0)
MCH: 28.9 pg (ref 26.0–34.0)
MCHC: 33.9 g/dL (ref 30.0–36.0)
MCV: 85.3 fL (ref 78.0–100.0)
Platelets: 296 10*3/uL (ref 150–400)
RBC: 3.74 MIL/uL — ABNORMAL LOW (ref 3.87–5.11)
RDW: 13.7 % (ref 11.5–15.5)
WBC: 9.7 10*3/uL (ref 4.0–10.5)

## 2018-01-05 LAB — GLUCOSE, CAPILLARY
GLUCOSE-CAPILLARY: 150 mg/dL — AB (ref 65–99)
GLUCOSE-CAPILLARY: 115 mg/dL — AB (ref 65–99)
GLUCOSE-CAPILLARY: 125 mg/dL — AB (ref 65–99)
GLUCOSE-CAPILLARY: 158 mg/dL — AB (ref 65–99)
Glucose-Capillary: 150 mg/dL — ABNORMAL HIGH (ref 65–99)

## 2018-01-05 LAB — HEMOGLOBIN A1C
HEMOGLOBIN A1C: 5.5 % (ref 4.8–5.6)
Mean Plasma Glucose: 111.15 mg/dL

## 2018-01-05 MED ORDER — SODIUM CHLORIDE 0.9 % IV SOLN
1.0000 g | Freq: Once | INTRAVENOUS | Status: AC
  Administered 2018-01-05: 1 g via INTRAVENOUS
  Filled 2018-01-05: qty 10

## 2018-01-05 MED ORDER — ORAL CARE MOUTH RINSE
15.0000 mL | Freq: Two times a day (BID) | OROMUCOSAL | Status: DC
  Administered 2018-01-05 – 2018-01-10 (×7): 15 mL via OROMUCOSAL

## 2018-01-05 MED ORDER — FLUMAZENIL 0.5 MG/5ML IV SOLN
0.3000 mg | Freq: Once | INTRAVENOUS | Status: AC
  Administered 2018-01-05: 0.3 mg via INTRAVENOUS
  Filled 2018-01-05: qty 5

## 2018-01-05 MED ORDER — DIPHENHYDRAMINE HCL 50 MG/ML IJ SOLN
12.5000 mg | Freq: Once | INTRAMUSCULAR | Status: AC
  Administered 2018-01-05: 12.5 mg via INTRAVENOUS
  Filled 2018-01-05: qty 1

## 2018-01-05 MED ORDER — DIPHENHYDRAMINE HCL 50 MG/ML IJ SOLN
INTRAMUSCULAR | Status: AC
  Filled 2018-01-05: qty 1

## 2018-01-05 MED ORDER — LORAZEPAM 2 MG/ML IJ SOLN
2.0000 mg | Freq: Once | INTRAMUSCULAR | Status: AC
  Administered 2018-01-05: 1 mg via INTRAVENOUS
  Filled 2018-01-05: qty 1

## 2018-01-05 MED ORDER — DIPHENHYDRAMINE HCL 50 MG/ML IJ SOLN
25.0000 mg | Freq: Once | INTRAMUSCULAR | Status: AC
  Filled 2018-01-05: qty 1

## 2018-01-05 MED ORDER — VANCOMYCIN HCL IN DEXTROSE 1-5 GM/200ML-% IV SOLN
1000.0000 mg | Freq: Two times a day (BID) | INTRAVENOUS | Status: DC
  Administered 2018-01-05 – 2018-01-07 (×4): 1000 mg via INTRAVENOUS
  Filled 2018-01-05 (×6): qty 200

## 2018-01-05 MED ORDER — ENALAPRILAT 1.25 MG/ML IV SOLN
1.2500 mg | Freq: Four times a day (QID) | INTRAVENOUS | Status: DC
  Administered 2018-01-05 – 2018-01-06 (×6): 1.25 mg via INTRAVENOUS
  Filled 2018-01-05 (×8): qty 1

## 2018-01-05 NOTE — Progress Notes (Signed)
PT Cancellation Note  Patient Details Name: Sheila [name] MRN: [id number] DOB: 1951-05-25   Cancelled Treatment:    Reason Eval/Treat Not Completed: Medical issues which prohibited therapy(pt with strict bedrest order and await increased activity)   Dailen Mcclish B Janzen Sacks 01/05/2018, 7:34 AM  Elwyn Reach, [place]

## 2018-01-05 NOTE — Evaluation (Signed)
Speech Language Pathology Evaluation Patient Details Name: Sheila [name] MRN: [id number] DOB: [birth year] Today's Date: 01/05/2018 Time: 9470-9628 SLP Time Calculation (min) (ACUTE ONLY): 21 min  Problem List:  Patient Active Problem List   Diagnosis Date Noted  . Lethargic 05/05/2017  . Low blood sugar 05/05/2017  . Nail abnormalities 12/31/2016  . Memory changes 09/16/2016  . Multiple falls 09/16/2016  . UTI (urinary tract infection) 02/28/2016  . Depression 02/28/2016  . Acute ischemic VBA thalamic stroke ([place])   . Stroke (cerebrum) ([place])   . Anomia 02/17/2016  . Stroke ([place]) 02/17/2016  . Peripheral arterial disease ([place]) 12/03/2015  . Neuropathy 09/05/2015  . Medicare annual wellness visit, subsequent 03/12/2015  . Routine general medical examination at a health care facility 10/08/2014  . Cerumen impaction 10/08/2014  . Low back pain potentially associated with radiculopathy 03/01/2013  . Hyperlipidemia 02/15/2012  . Type 2 diabetes mellitus with neurologic complication ([place]) 36/62/9476   Past Medical History:  Past Medical History:  Diagnosis Date  . Arthritis   . Diabetes mellitus    complicated by peripheral neuropathy  . Hyperlipidemia   . Stroke [facility]) 2015   residual right foot drop   Past Surgical History:  Past Surgical History:  Procedure Laterality Date  . CHOLECYSTECTOMY    . TONSILLECTOMY     HPI:  67 year old female resident of SNF with PMH which includes prior CVAs with residual deficits (wheelchair bound), presents with changes in mentation. CTY scan initially negative, TPA given with hemorrhagic transformation, repeat CT scan confirms acute bilateral IVH.    Assessment / Plan / Recommendation Clinical Impression  Patient presents with cognitive-linguistic deficits impacting auditory comprehension, attention, awareness, memory, and problem solving, however all likely stemming from severe lethargy, requiring constant SLP verbal cueing for  arousal. Patient will benefit from acute SLP services for ongoing differential diagnosis of deficits as alertness improves.     SLP Assessment  SLP Recommendation/Assessment: Patient needs continued Speech Lanaguage Pathology Services SLP Visit Diagnosis: Cognitive communication deficit (R41.841)    Follow Up Recommendations  Skilled Nursing facility    Frequency and Duration min 2x/week  2 weeks      SLP Evaluation Cognition  Overall Cognitive Status: Impaired/Different from baseline Arousal/Alertness: Lethargic Orientation Level: Oriented to person;Disoriented to place;Disoriented to time;Disoriented to situation Attention: Focused Focused Attention: Impaired Focused Attention Impairment: Verbal basic Memory: Impaired Memory Impairment: Storage deficit Awareness: Impaired Awareness Impairment: Intellectual impairment Problem Solving: Impaired Problem Solving Impairment: Verbal basic;Functional basic Comments: impacted by lethargy       Comprehension  Auditory Comprehension Overall Auditory Comprehension: Impaired Yes/No Questions: Within Functional Limits(for basic biographical ) Commands: Impaired One Step Basic Commands: 25-49% accurate Interfering Components: Other (comment);Attention(lethargy) Visual Recognition/Discrimination Discrimination: Not tested Reading Comprehension Reading Status: Not tested    Expression Expression Primary Mode of Expression: Verbal Verbal Expression Overall Verbal Expression: Appears within functional limits for tasks assessed(although verbal ouput limited)   Oral / Motor  Oral Motor/Sensory Function Overall Oral Motor/Sensory Function: Other (comment)(appears WFL although unable to particpate in formal assessme) Motor Speech Overall Motor Speech: Appears within functional limits for tasks assessed(verbal output limited)   GO            Gabriel Rainwater MA, CCC-SLP [phone]         Sheila [name] Meryl 01/05/2018, 10:28 AM

## 2018-01-05 NOTE — Evaluation (Signed)
Clinical/Bedside Swallow Evaluation Patient Details  Name: Sheila [name] MRN: [id number] Date of Birth: 25-Nov-1951  Today's Date: 01/05/2018 Time: SLP Start Time (ACUTE ONLY): 1001 SLP Stop Time (ACUTE ONLY): 1022 SLP Time Calculation (min) (ACUTE ONLY): 21 min  Past Medical History:  Past Medical History:  Diagnosis Date  . Arthritis   . Diabetes mellitus    complicated by peripheral neuropathy  . Hyperlipidemia   . Stroke [facility]) 2015   residual right foot drop   Past Surgical History:  Past Surgical History:  Procedure Laterality Date  . CHOLECYSTECTOMY    . TONSILLECTOMY     HPI:  67 year old female resident of SNF with PMH which includes prior CVAs with residual deficits (wheelchair bound), presents with changes in mentation. CTY scan initially negative, TPA given with hemorrhagic transformation, repeat CT scan confirms acute bilateral IVH.    Assessment / Plan / Recommendation Clinical Impression  Bedside swallow evaluation complete, limited given severe lethargy requiring almost constant cueing for maintained level of alertness. Patient was able to self feed, with HOH assistance, thin liquids via cup and straw and pureed solid with mildly prolonged oral transit time but seemingly good airway protection. Suspect that when alertness improves, patient will be able to safely initiate a po diet. For now, recommend small sips of H20 only and/or ice chips after oral care, meds crushed in puree, otherwise NPO. SLP will f/u for diagnostic bedside po trials. SLP Visit Diagnosis: Cognitive communication deficit (R41.841)    Aspiration Risk  Moderate aspiration risk    Diet Recommendation Ice chips PRN after oral care;NPO except meds   Liquid Administration via: Cup;Straw Medication Administration: Crushed with puree Supervision: Patient able to self feed;Staff to assist with self feeding;Full supervision/cueing for compensatory strategies Compensations: Slow rate;Small  sips/bites Postural Changes: Seated upright at 90 degrees    Other  Recommendations Oral Care Recommendations: Oral care QID;Oral care prior to ice chip/H20   Follow up Recommendations Skilled Nursing facility      Frequency and Duration min 2x/week  2 weeks       Prognosis Prognosis for Safe Diet Advancement: Good Barriers to Reach Goals: Cognitive deficits      Swallow Study   General HPI: 67 year old female resident of SNF with PMH which includes prior CVAs with residual deficits (wheelchair bound), presents with changes in mentation. CTY scan initially negative, TPA given with hemorrhagic transformation, repeat CT scan confirms acute bilateral IVH.  Type of Study: Bedside Swallow Evaluation Previous Swallow Assessment: none noted Diet Prior to this Study: NPO Temperature Spikes Noted: No Respiratory Status: Room air History of Recent Intubation: No Behavior/Cognition: Lethargic/Drowsy;Requires cueing Oral Cavity Assessment: Within Functional Limits Oral Care Completed by SLP: Yes Oral Cavity - Dentition: Adequate natural dentition Vision: Functional for self-feeding Self-Feeding Abilities: Able to feed self;Needs assist Patient Positioning: Upright in bed Baseline Vocal Quality: Low vocal intensity Volitional Cough: Cognitively unable to elicit Volitional Swallow: Unable to elicit    Oral/Motor/Sensory Function Overall Oral Motor/Sensory Function: Other (comment)(appears WFL although unable to particpate in formal assessme)   Ice Chips Ice chips: Impaired Presentation: Spoon Oral Phase Impairments: Impaired mastication Oral Phase Functional Implications: Prolonged oral transit   Thin Liquid Thin Liquid: Impaired Presentation: Cup;Straw;Self Fed Oral Phase Functional Implications: Prolonged oral transit    Nectar Thick Nectar Thick Liquid: Not tested   Honey Thick Honey Thick Liquid: Not tested   Puree Puree: Impaired Presentation: Spoon Oral Phase Impairments:  Impaired mastication Oral Phase  Functional Implications: Prolonged oral transit Pharyngeal Phase Impairments: Throat Clearing - Immediate   Solid   Sheila Hanna MA, CCC-SLP [phone]    Solid: Not tested        Sheila [name] 01/05/2018,10:47 AM

## 2018-01-05 NOTE — Progress Notes (Signed)
NEUROHOSPITALISTS STROKE TEAM - DAILY PROGRESS NOTE   ADMISSION HISTORY:  Sheila [name] is an 67 y.o. female with a history of 2 previous strokes the most recent of which was a left thalamic infarct in February 2017 with residual right foot drop. At that time she was seen by Dr. Jaynee Eagles and switched from aspirin to Plavix. Prior to that she had a right CVA details not available. Other medical problems include hyperlipidemia, depression, history of low back pain, diabetes mellitus with neuropathy, peripheral arterial disease, and arthritis.   The patient is currently a resident at the [facility], a nursing facility. Today at 11:30 AM she pulled her wheelchair away from the table and began to spin in circles. The patient was evaluated by a nurse, EMS was summoned, and a code stroke was initiated. An initial head CT at [facility] today showed no acute findings. The old left thalamic infarct was noted as well as advanced chronic small vessel ischemic changes. Dr. Leonel Ramsay was unable to contact the patient's family. A decision was made to proceed with IV TPA which was given at 1424.   LSN: 01/04/2018 at 11:30 AM tPA Given: IV TPA was given at 1424 on 01/04/2018.  Per the nursing tech who witnessed the event.  The patient became suddenly agitated, put her right arm to her head and pushed back from the table that she was sitting at.  At that point, the tach realize she was only pushing with her right leg and therefore was "spinning in circles" as she was trying to push away.  She was not moving the left arm or leg therefore EMS was called.  En route, the left arm and leg weakness improved, however she was still suffering from a left facial droop, severe left hemineglect, left hemianopia, as well as severe confusion.  Given this picture, she was given IV TPA. Family was not present so was administered with emergency consent after  consultation with ER physician.   Following IV tPA, the patient remained quite agitated, and this plus her paucity of speech prompted a stat EEG.   SUBJECTIVE (INTERVAL HISTORY)  No family is at the bedside. Patient is found laying in bed in NAD. Patient very sleepy but does wake up to voice. Not oriented or following commands consistently.  No other new/acute events reported overnight.  Blood pressure has been controlled.   .  No family at the bedside   OBJECTIVE Lab Results: CBC:  Recent Labs  Lab 01/04/18 1356 01/04/18 1407 01/05/18 0435  WBC 9.6  --  9.7  HGB 12.5 12.6 10.8*  HCT 36.9 37.0 31.9*  MCV 86.8  --  85.3  PLT 259  --  296   BMP: Recent Labs  Lab 01/04/18 1356 01/04/18 1407  NA 141 141  K 5.3* 5.2*  CL 108 106  CO2 25  --   GLUCOSE 100* 99  BUN 9 13  CREATININE 0.98 0.90  CALCIUM 9.5  --    Liver Function Tests:  Recent Labs  Lab 01/04/18 1356  AST 42*  ALT 21  ALKPHOS 117  BILITOT 1.4*  PROT 6.9  ALBUMIN 3.8   Coagulation Studies:  Recent Labs    01/04/18 1356  APTT 28  INR 0.97    Microbiology: Blood Cx - NGTF Urine Cx - Pending Urinalysis:  Recent Labs  Lab 01/05/18 Garrett 1.013  PHURINE 6.0  GLUCOSEU 150*  HGBUR  SMALL*  BILIRUBINUR NEGATIVE  KETONESUR NEGATIVE  PROTEINUR NEGATIVE  NITRITE POSITIVE*  LEUKOCYTESUR SMALL*   Urine Drug Screen:     Component Value Date/Time   LABOPIA NONE DETECTED 01/05/2018 0926   COCAINSCRNUR NONE DETECTED 01/05/2018 0926   LABBENZ POSITIVE (A) 01/05/2018 0926   AMPHETMU NONE DETECTED 01/05/2018 0926   THCU NONE DETECTED 01/05/2018 0926   LABBARB NONE DETECTED 01/05/2018 0926    Alcohol Level:  Recent Labs  Lab 01/04/18 1356  ETH <10   PHYSICAL EXAM Temp:  [97.9 F (36.6 C)-98.8 F (37.1 C)] 98.8 F (37.1 C) (01/10 1951) Pulse Rate:  [76-117] 86 (01/10 1900) Resp:  [13-35] 18 (01/10 1900) BP: (75-203)/(37-178) 119/108 (01/10  1900) SpO2:  [92 %-100 %] 97 % (01/10 1900) General - Well nourished, well developed, in no apparent distress HEENT-  Normocephalic, Normal external eye/conjunctiva.  Normal external ears. Normal external nose, mucus membranes and septum.   Cardiovascular - Regular rate and rhythm  Respiratory - Lungs clear bilaterally. No wheezing. Abdomen - soft and non-tender, BS normal Extremities- no edema or cyanosis Neuro Exam : Mental Status: Patient is somnolent but arousable. She tells me her name and follows commands, She doe short answer many questions and appears intensely anxious.  Cranial Nerves: II: Does not blink to threat from the left. Does from the right. PERRL  III,IV, VI: R gaze deviation.  V: VII: blinks to eyelid stimulaiton bilaterally.  XII: tongue is midline without atrophy or fasciculations.  Motor: She moves all extremities with good strength, but appears to neglect the left side.  Sensory: She flinches to pain in the left, but does not localize, seems to think pain in her left arm is coming from her face.  Cerebellar: Does not perform, but no clear ataxia.   IMAGING: I have personally reviewed the radiological images below and agree with the radiology interpretations.  Ct Head Wo Contrast Result Date: 01/05/2018 IMPRESSION: Unchanged appearance of intraventricular blood within the occipital horns of both lateral ventricles. No new site of hemorrhage or new ventricular dilatation. Electronically Signed   By: Ulyses Jarred M.D.   On: 01/05/2018 05:15   Ct Head Wo Contrast Result Date: 01/04/2018 IMPRESSION: 1. Suspected redistribution of intraventricular blood, now within both occipital horns of the lateral ventricles. 2. Unchanged size and configuration of the ventricles. 3. No new site of hemorrhage. Electronically Signed   By: Ulyses Jarred M.D.   On: 01/04/2018 22:13   Ct Head Wo Contrast Result Date: 01/04/2018 IMPRESSION: 1. New intraventricular hemorrhage layering in  the occipital horn on the left. No hydrocephalus. 2. No visible acute gray matter infarct. 3. Severe white matter disease with volume loss. Remote lacunar infarct in the left thalamus. Electronically Signed   By: Monte Fantasia M.D.   On: 01/04/2018 16:21   Ct Angio Head Lavetta Nielsen W Or Wo Contrast Result Date: 01/04/2018 IMPRESSION: No large or medium vessel occlusion. No significant carotid bifurcation disease. No stenosis on the left. Focal calcified plaque at the distal bulb on the right but without stenosis when compared to the more distal cervical ICA. 50% stenosis the left subclavian artery just beyond the origin. Peripheral calcification throughout both carotid siphon regions. Generalized narrowing throughout the siphon regions, with stenosis estimated in the 30-50% range. These results were communicated to Dr. Leonel Ramsay At 2:43 pmon 1/9/2019by text page via the [facility] messaging system. Electronically Signed   By: Nelson Chimes M.D.   On: 01/04/2018 14:45   Mr Brain Lottie Dawson  Contrast Result Date: 01/05/2018 IMPRESSION: Motion degradation on multiple sequences. 1. Stable intraventricular hemorrhage pooling in left greater than right occipital horns. Stable ventricle size. 2. No stroke, new acute hemorrhage, or significant mass effect identified. 3. Advanced chronic microvascular ischemic changes and parenchymal volume loss of the brain. Electronically Signed   By: Kristine Garbe M.D.   On: 01/05/2018 17:34   Ct Head Code Stroke Wo Contrast Result Date: 01/04/2018 IMPRESSION: 1. No acute finding. Advanced chronic small-vessel ischemic changes throughout the hemispheric white matter. Old left thalamic infarction. 2. ASPECTS is 10. 3. These results were communicated to Dr. Leonel Ramsay At 2:12 pmon 1/9/2019by text page via the [facility] messaging system. Electronically Signed   By: Nelson Chimes M.D.   On: 01/04/2018 14:14   Echocardiogram:                                               Study Conclusions -  Left ventricle: The cavity size was normal. There was mild   concentric hypertrophy. Systolic function was vigorous. The   estimated ejection fraction was in the range of 65% to 70%. Wall   motion was normal; there were no regional wall motion   abnormalities. Doppler parameters are consistent with abnormal   left ventricular relaxation (grade 1 diastolic dysfunction). - Mitral valve: Mildly calcified annulus. Valve area by continuity   equation (using LVOT flow): 1.79 cm^2. - Left atrium: The atrium was mildly dilated. - Pulmonary arteries: Systolic pressure was mildly increased. PA   peak pressure: 34 mm Hg (S).  Impressions:  No cardiac source of embolism was identified, but cannot be ruled   out on the basis of this examination. Recommendations:  Consider transesophageal echocardiography if clinically indicated.  TEE                                  PENDING  EEG:                                                               EEG Abnormalities: 1) possible asymmetric PDR, though I do not think this is definite by the study. 2) generalized irregular slow activity Clinical Interpretation: This is a limited EEG is consistent with a generalized nonspecific cerebral dysfunction, with possible superimposed right hemispheric dysfunction.   There was no seizure or seizure predisposition recorded on this study     IMPRESSION: Sheila [name] is a 67 y.o. female with PMH of  2 previous strokes the most recent of which was a left thalamic infarct in February 2017 with a residual right foot drop, hyperlipidemia, diabetes mellitus, peripheral arterial disease, and arthritis brought to [facility] today from her nursing facility as a code stroke with reports of acute onset Left sided weakness.  IV TPA was given at 1424 on 01/04/2018.  Per the nursing tech who witnessed the event.  The patient became suddenly agitated, put her right arm to her head and pushed back from the table  that she was sitting at.  At that point, the tech realized she was only pushing  with her right leg and therefore was "spinning in circles" as she was trying to push away.  She was not moving the left arm or leg therefore EMS was called.  En route, the left arm and leg weakness improved, however she was still suffering from a left facial droop, severe left hemineglect, left hemianopia, as well as severe confusion.  Given this picture, she was given IV TPA. Family was not present so was administered with emergency consent after consultation with ER physician. Following IV tPA, the patient remained quite agitated, and this plus her paucity of speech prompted a stat EEG.   BP spiked following severe agitation, ativan given to confirm BP, following this it remained elevated and CT was obtained showing IVH. Giving cryoprecipitate and tranexamic acid  Patient with high fever, mental status changes on arrival. Meningitis may need to be considered as well. hence ordered empiric antibiotics.  Patient had repeat CTH. Increased IVH with no hydro. SBP 120-130s on Cleviprex. Exam essentially unchanged from prior exam with exception of decrease in level of arousal, but is able to open eyes to voice. Not following commands. Not agitated. D/W NSGY on call over phone - no neurosurgical intervention indicated at this time. Repeat CTH at 0400 hrs stable ICH  New intraventricular hemorrhage layering in the occipital horn on the left.  No stroke, new acute hemorrhage post TPA reversed with cryo and TXA no significant mass effect identified.  Suspected Etiology: Hemorrhage s/p IV tPA for stroke mimic Resultant Symptoms:  left facial droop, severe left hemineglect, left hemianopia, severe confusion.  Stroke Risk Factors: diabetes mellitus, hyperlipidemia and hypertension Other Stroke Risk Factors: Advanced age, Obesity, Body mass index is 33.27 kg/m.  Hx stroke, PAD  Outstanding Stroke Work-up Studies:     TEE                                                                          01/05/2018 ASSESSMENT:     PLAN  01/05/2018: Continue Statin HOLD ASA until 24 hour post tPA neuroimaging is stable & without evidence of bleeding Frequent neuro checks Telemetry monitoring PT/OT/SLP Consult PM & Rehab Consult Case Management /MSW Ongoing aggressive stroke risk factor management Patient will be counseled to be compliant with her antithrombotic medications Patient will be counseled on Lifestyle modifications including, Diet, Exercise, and Stress Follow up with [facility] in 6 weeks  HX OF STROKES: Remote lacunar infarct in the left thalamus.  MEDICAL ISSUES:  CCM following, Appreciate Assistance High risk for intubation given acute IVH  Hypertensive crisis  - failed BP goals on labetalol and cardene, changed to cleviprex  - troponin neg  S/p TPA f/b TXA and cryo for acute IVH Acute allergic reaction to IV contrast  Acute encephalopathy- related to ativan and concern for progressing IVH, monitor development of hydrocephalus  empiric antibiotic coverage for meningitis, in progress  SEIZURES: EEG- No seizure activity noted No seizure activity reported overnight Maintain Seizure precautions  Anemia  Hyperkalemia  UTI: Possible Sepsis Cultures pending Start ABX - in progress  HYPERTENSION: Stable BP goal: 120-140 SBP range Nicardipine drip, Labetolol PRN Long term BP goal normotensive. May slowly start B/P medications after 48 hours Home Meds: NONE  HYPERLIPIDEMIA:    Component Value Date/Time   CHOL 158 02/19/2016 0556   TRIG 165 (H) 02/19/2016 0556   HDL 33 (L) 02/19/2016 0556   CHOLHDL 4.8 02/19/2016 0556   VLDL 33 02/19/2016 0556   LDLCALC 92 02/19/2016 0556   Home Meds:  Lipitor 40 LDL  goal < 70 Will continue on Lipitor to 80 mg daily, once medically stable Continue statin at discharge  DIABETES: Lab Results  Component Value Date   HGBA1C 5.5  01/05/2018  HgbA1c goal < 7.0 Currently on: Novolog Continue CBG monitoring and SSI to maintain glucose 140-180 mg/dl DM education   TOBACCO ABUSE & POLYSUBSTANCE ABUSE UDS+ Current smoker Smoking cessation counseling provided Nicotine patch provided  OBESITY Obesity, Body mass index is 33.27 kg/m. Greater than/equal to 30  Other Active Problems: Active Problems:   Type 2 diabetes mellitus with neurologic complication (HCC)   Stroke (cerebrum) (HCC)   Intraventricular hemorrhage (HCC)   Hypertensive crisis    Hospital day # 1 VTE prophylaxis: SCD's  Diet : Diet NPO time specified   FAMILY UPDATES: No family at bedside  TEAM UPDATES: Garvin Fila, MD   Prior Home Stroke Medications:  clopidogrel 75 mg daily  Discharge Stroke Meds:  Please discharge patient on TBD   Disposition: 01-Home or Self Care Therapy Recs:               PENDING Home Equipment:         PENDING Follow Up:  Follow-up Information    Garvin Fila, MD. Schedule an appointment as soon as possible for a visit in 6 week(s).   Specialties:  Neurology, Radiology Contact information: [address] [place] [address] [place], [place], [place] -PCP Follow up in 1-2 weeks   Case Management aware of need   Assessment & plan discussed with with attending physician and they are in agreement.    Mary Sella, ANP-C Stroke Neurology Team 01/05/2018 8:05 PM I have personally examined this patient, reviewed notes, independently viewed imaging studies, participated in medical decision making and plan of care.ROS completed by me personally and pertinent positives fully documented  I have made any additions or clarifications directly to the above note. Agree with note above.  She presented with altered mental status and focal left-sided weakness and neglect and was given IV TPA but developed post TPA intraventricular hemorrhage.  She remains at risk for hemorrhage  expansion and development of hydrocephalus.  Maintain strict blood pressure control and close neurological monitoring.  Critical care team helping manage medical issues.  No family available for discussion at the bedside.D/W Dr Carson Myrtle This patient is critically ill and at significant risk of neurological worsening, death and care requires constant monitoring of vital signs, hemodynamics,respiratory and cardiac monitoring, extensive review of multiple databases, frequent neurological assessment, discussion with family, other specialists and medical decision making of high complexity.I have made any additions or clarifications directly to the above note.This critical care time does not reflect procedure time, or teaching time or supervisory time of PA/NP/Med Resident etc but could involve care discussion time.  I spent 30 minutes of neurocritical care time  in the care of  this patient.     Antony Contras, MD Medical Director [facility] Stroke [facility] Pager: [phone] 01/05/2018 8:47 PM  To contact Stroke Continuity provider, please refer to [email or web address] After hours, contact General Neurology

## 2018-01-05 NOTE — Progress Notes (Signed)
Repeat CTH  Done at 0500 reviewed.  No change from prior [facility]. Bilateral IVH with no hydrocephalus.  Further management per stroke team.  -- Amie Portland, MD [facility] Pager: [phone] If 7pm to 7am, please call on call as listed on AMION.

## 2018-01-05 NOTE — Progress Notes (Signed)
OT Cancellation Note  Patient Details Name: Sheila [name] MRN: [id number] DOB: [birth year]   Cancelled Treatment:    Reason Eval/Treat Not Completed: Patient not medically ready. Pt on bedrest.   [place], OT/L  [phone] 01/05/2018 01/05/2018, 9:11 AM

## 2018-01-05 NOTE — Progress Notes (Signed)
PULMONARY / CRITICAL CARE MEDICINE   Name: Sheila [name] MRN: [id number] DOB: [birth year]    ADMISSION DATE:  01/04/2018 CONSULTATION DATE:  01/04/2018  REFERRING MD:  Leonel Ramsay   CHIEF COMPLAINT:  CVA  HISTORY OF PRESENT ILLNESS:   HPI obtained from medical chart review as patient is acutely encephalopathic.    This 67 year old female was seen on 01/04/2018 in consultation at the request of Dr. Roland Rack for recommendations on further evaluation and management of altered mental status. The patinet presetne with acute mental status changes to the ER but had acute worsening while in the ER, prompting request for consideration for intubation.  past medical history as below significant for but not limited to to prior strokes with residual right foot drop, diabetes, arthritis and hyperlipidemia who presented with altered mental status.  Patient is a resident at [facility]. Witnessed by staff to have acute change in her mental status today and unable to move left arm or leg and therefore was spinning in her wheelchair;  LKW at 1130 am.  Presented via EMS as a code stroke.  In the ER, patient noted to have left facial droop, left hemineglect, left hemianopia, and confusion.  Head CT with negative for acute findings, showing an old left thalamic infarct.  TPA was administered at 1424.  Following TPA, patient severely agitated with change in speech, and hypertension prompting stat EEG and repeat head CT confirming acute IVH.  Developed hives after IV contrast for CTA treated with benadryl and solumedrol. Cryoprecipitate and tranexamic acid given.  PCCM consulted for concern for airway protection given acute neurologic changes.    PAST MEDICAL HISTORY :  She  has a past medical history of Arthritis, Diabetes mellitus, Hyperlipidemia, and Stroke ([place]) (2015).  PAST SURGICAL HISTORY: She  has a past surgical history that includes Cholecystectomy and Tonsillectomy.  Allergies   Allergen Reactions  . Sulfa Antibiotics Other (See Comments)    As noted on MAR  . Iopamidol     rash  . Paxil [Paroxetine Hcl] Other (See Comments)    Dreams/ nightmares    No current facility-administered medications on file prior to encounter.    Current Outpatient Medications on File Prior to Encounter  Medication Sig  . atorvastatin (LIPITOR) 40 MG tablet Take 1 tablet (40 mg total) by mouth daily at 6 PM.  . Insulin Pen Needle 31G X 6 MM MISC Use as needed to inject insulin in the skin.  Marland Kitchen clopidogrel (PLAVIX) 75 MG tablet Take 1 tablet (75 mg total) by mouth daily.  . Dulaglutide (TRULICITY) 7.01 XB/9.3JQ SOPN INJECT 0.75 MG INTO THE SKIN SUBCUTANEOUSLY ONCE WEEKLY  . fish oil-omega-3 fatty acids 1000 MG capsule Take 1 g by mouth daily.  Marland Kitchen gabapentin (NEURONTIN) 300 MG capsule Take 1 capsule by mouth at  bedtime (Patient taking differently: Take 300mg  capsule by mouth at  bedtime)  . Insulin Glargine (LANTUS SOLOSTAR) 100 UNIT/ML Solostar Pen Inject 30 Units into the skin daily. Follow-up appt was due back in June must see provider for future refills  . LORazepam (ATIVAN) 0.5 MG tablet TAKE ONE TABLET BY MOUTH TWICE DAILY AS NEEDED FOR ANXIETY  . meloxicam (MOBIC) 15 MG tablet Take 15 mg by mouth daily.  . metFORMIN (GLUCOPHAGE) 500 MG tablet Take 1 tablet (500 mg total) by mouth 2 (two) times daily with a meal. (Patient taking differently: Take 500-1,000 mg by mouth 2 (two) times daily with a meal. Per pharmacy records- pt takes  1,000 mg in the morning. 500 mg at bedtime .last fill date; 01-02-18 for 30 day supply)  . Multiple Vitamin (DAILY-VITE) TABS Take 1 tablet by mouth every morning.  Marland Kitchen QUEtiapine (SEROQUEL) 25 MG tablet Take 1 tablet (25 mg total) by mouth at bedtime.  . sertraline (ZOLOFT) 100 MG tablet Take 1 tablet by mouth  daily (Patient taking differently: Take 100 mg by mouth once a day)    FAMILY HISTORY:  Her indicated that her mother is deceased. She indicated  that her father is deceased. She indicated that her brother is alive. She indicated that the status of her son is unknown.   SOCIAL HISTORY: She  reports that she quit smoking about 25 years ago. Her smoking use included cigarettes. she has never used smokeless tobacco. She reports that she does not drink alcohol or use drugs.  REVIEW OF SYSTEMS:   Unable to assess.    SUBJECTIVE:  TXA given in ER On cleviprex 4mg /hr Cryo infusing   VITAL SIGNS: BP 122/60   Pulse 80   Temp 97.9 F (36.6 C)   Resp 16   Ht 5\' 3"  (1.6 m)   Wt 85.2 kg (187 lb 13.3 oz)   SpO2 100%   BMI 33.27 kg/m   HEMODYNAMICS:    VENTILATOR SETTINGS:    INTAKE / OUTPUT: I/O last 3 completed shifts: In: 2472.4 [I.V.:1332.4; Blood:240; IV ONGEXBMWU:132] Out: 1000 [Urine:1000]  PHYSICAL EXAMINATION: General: Acute on chronically ill elderly female obtunded in bed HEENT: lips dry, right gaze, pupils 4/=/reactive, arcus senilis, will not let open mouth Neuro: obtunded, will arouse minimally to verbal, attempts to localize, moving extremities spontaneously upper greater than lower CV: ST, no m/r/g PULM: no acute distress, regular, lungs clear, diminished at bases GI: obese, soft,  bs active, purwick in place Extremities: warm/dry, +2 BLE, left hand with bruising, left anterior foot and lateral ankle warm with erythema Skin: very mild generalized resolving hives    LABS:  BMET Recent Labs  Lab 01/04/18 1356 01/04/18 1407  NA 141 141  K 5.3* 5.2*  CL 108 106  CO2 25  --   BUN 9 13  CREATININE 0.98 0.90  GLUCOSE 100* 99    Electrolytes Recent Labs  Lab 01/04/18 1356  CALCIUM 9.5    CBC Recent Labs  Lab 01/04/18 1356 01/04/18 1407 01/05/18 0435  WBC 9.6  --  9.7  HGB 12.5 12.6 10.8*  HCT 36.9 37.0 31.9*  PLT 259  --  296    Coag's Recent Labs  Lab 01/04/18 1356  APTT 28  INR 0.97    Sepsis Markers No results for input(s): LATICACIDVEN, PROCALCITON, O2SATVEN in the last  168 hours.  ABG No results for input(s): PHART, PCO2ART, PO2ART in the last 168 hours.  Liver Enzymes Recent Labs  Lab 01/04/18 1356  AST 42*  ALT 21  ALKPHOS 117  BILITOT 1.4*  ALBUMIN 3.8    Cardiac Enzymes No results for input(s): TROPONINI, PROBNP in the last 168 hours.  Glucose Recent Labs  Lab 01/04/18 1359 01/04/18 2010 01/04/18 2329 01/05/18 0336 01/05/18 0815 01/05/18 1203  GLUCAP 96 218* 260* 150* 115* 125*    Imaging Ct Angio Head W Or Wo Contrast  Result Date: 01/04/2018 CLINICAL DATA:  Code stroke.  Left facial droop. EXAM: CT ANGIOGRAPHY HEAD AND NECK TECHNIQUE: Multidetector CT imaging of the head and neck was performed using the standard protocol during bolus administration of intravenous contrast. Multiplanar CT image reconstructions and MIPs  were obtained to evaluate the vascular anatomy. Carotid stenosis measurements (when applicable) are obtained utilizing NASCET criteria, using the distal internal carotid diameter as the denominator. CONTRAST:  50 cc Isovue 370 COMPARISON:  CT earlier same day.  Multiple examinations 2017. FINDINGS: CTA NECK FINDINGS Aortic arch: No aneurysm or dissection. Mild atherosclerotic plaque without calcification. Branching pattern of the brachiocephalic vessels is normal. Atherosclerotic calcification of the proximal left subclavian artery with tortuosity. 50% stenosis at the origin of that vessel. Right carotid system: Common carotid artery widely patent to the bifurcation. Calcified plaque in the ICA bulb. No stenosis when compared to the more distal cervical ICA diameter of 3.5-4 mm. Left carotid system: Common carotid artery widely patent to the bifurcation. No atherosclerotic plaque at the bifurcation or ICA bulb. Cervical ICA widely patent, diameter 3.5-4 mm. Vertebral arteries: Both vertebral artery origins are widely patent. Both vertebral arteries are widely patent through the cervical region to the foramen magnum. Skeleton:  Ordinary mid cervical spondylosis. Other neck: No mass or lymphadenopathy. Upper chest: Negative Review of the MIP images confirms the above findings CTA HEAD FINDINGS Anterior circulation: There is extensive atherosclerotic calcification in the carotid siphon region on each side. No stenosis greater than 30-50% is suspected. Supraclinoid internal carotid arteries are widely patent. The anterior and middle cerebral vessels are patent without proximal stenosis, aneurysm or vascular malformation. No missing vessels identified. Posterior circulation: Both vertebral arteries widely patent through the foramen magnum to the basilar. Posterior inferior cerebellar arteries show flow. No basilar stenosis. Superior cerebellar and posterior cerebral arteries appear widely patent. Venous sinuses: Patent and normal. Anatomic variants: None significant Delayed phase: No abnormal enhancement Review of the MIP images confirms the above findings IMPRESSION: No large or medium vessel occlusion. No significant carotid bifurcation disease. No stenosis on the left. Focal calcified plaque at the distal bulb on the right but without stenosis when compared to the more distal cervical ICA. 50% stenosis the left subclavian artery just beyond the origin. Peripheral calcification throughout both carotid siphon regions. Generalized narrowing throughout the siphon regions, with stenosis estimated in the 30-50% range. These results were communicated to Dr. Leonel Ramsay At 2:43 pmon 1/9/2019by text page via the [facility] messaging system. Electronically Signed   By: Nelson Chimes M.D.   On: 01/04/2018 14:45   Ct Head Wo Contrast  Result Date: 01/05/2018 CLINICAL DATA:  Stroke follow-up EXAM: CT HEAD WITHOUT CONTRAST TECHNIQUE: Contiguous axial images were obtained from the base of the skull through the vertex without intravenous contrast. COMPARISON:  Head CT 01/04/2018 FINDINGS: Examination is degraded by motion. Brain: Unchanged distribution  intraventricular blood within the occipital horns of both lateral ventricles. Size and configuration of the ventricles are unchanged. There is periventricular hypoattenuation compatible with chronic microvascular disease. No new site of hemorrhage. Vascular: No hyperdense vessel or unexpected calcification. Skull: Normal. Negative for fracture or focal lesion. Sinuses/Orbits: No acute finding. Other: None. IMPRESSION: Unchanged appearance of intraventricular blood within the occipital horns of both lateral ventricles. No new site of hemorrhage or new ventricular dilatation. Electronically Signed   By: Ulyses Jarred M.D.   On: 01/05/2018 05:15   Ct Head Wo Contrast  Result Date: 01/04/2018 CLINICAL DATA:  Status post tPA.  Known intracranial hemorrhage. EXAM: CT HEAD WITHOUT CONTRAST TECHNIQUE: Contiguous axial images were obtained from the base of the skull through the vertex without intravenous contrast. COMPARISON:  Head CT 01/04/2018 at 4 p.m. FINDINGS: Brain: Unchanged appearance of blood within the occipital horn of the  left lateral ventricle. There is now also blood in the occipital horn of the right lateral ventricle, which may be secondary to redistribution. There is periventricular hypoattenuation compatible with chronic microvascular disease. No midline shift or mass effect. The size and configuration of the ventricles are unchanged. There is no new site of hemorrhage. Vascular: No hyperdense vessel or unexpected calcification. Skull: Normal. Negative for fracture or focal lesion. Sinuses/Orbits: No acute finding. Other: None. IMPRESSION: 1. Suspected redistribution of intraventricular blood, now within both occipital horns of the lateral ventricles. 2. Unchanged size and configuration of the ventricles. 3. No new site of hemorrhage. Electronically Signed   By: Ulyses Jarred M.D.   On: 01/04/2018 22:13   Ct Head Wo Contrast  Result Date: 01/04/2018 CLINICAL DATA:  Change in status after tPA.  Stroke.  EXAM: CT HEAD WITHOUT CONTRAST TECHNIQUE: Contiguous axial images were obtained from the base of the skull through the vertex without intravenous contrast. COMPARISON:  Head CT and CTA from earlier today FINDINGS: Brain: New intraventricular hemorrhage layering in the occipital horn of the left lateral ventricle. Stable ventricular volume that is enlarged due to atrophy. No detected gray matter infarct. There is advanced presumed chronic small vessel ischemia in the white matter with volume loss. Remote lacunar infarct in the left thalamus. Vascular: Atherosclerotic calcification.  No hyperdense vessel. Skull: Negative Sinuses/Orbits: Gaze to the right.  History of neglect. Other: Critical Value/emergent results were called by telephone at the time of interpretation on 01/04/2018 at 4:18 pm to Dr. Roland Rack , who verbally acknowledged these results. IMPRESSION: 1. New intraventricular hemorrhage layering in the occipital horn on the left. No hydrocephalus. 2. No visible acute gray matter infarct. 3. Severe white matter disease with volume loss. Remote lacunar infarct in the left thalamus. Electronically Signed   By: Monte Fantasia M.D.   On: 01/04/2018 16:21   Ct Angio Neck W Or Wo Contrast  Result Date: 01/04/2018 CLINICAL DATA:  Code stroke.  Left facial droop. EXAM: CT ANGIOGRAPHY HEAD AND NECK TECHNIQUE: Multidetector CT imaging of the head and neck was performed using the standard protocol during bolus administration of intravenous contrast. Multiplanar CT image reconstructions and MIPs were obtained to evaluate the vascular anatomy. Carotid stenosis measurements (when applicable) are obtained utilizing NASCET criteria, using the distal internal carotid diameter as the denominator. CONTRAST:  50 cc Isovue 370 COMPARISON:  CT earlier same day.  Multiple examinations 2017. FINDINGS: CTA NECK FINDINGS Aortic arch: No aneurysm or dissection. Mild atherosclerotic plaque without calcification. Branching  pattern of the brachiocephalic vessels is normal. Atherosclerotic calcification of the proximal left subclavian artery with tortuosity. 50% stenosis at the origin of that vessel. Right carotid system: Common carotid artery widely patent to the bifurcation. Calcified plaque in the ICA bulb. No stenosis when compared to the more distal cervical ICA diameter of 3.5-4 mm. Left carotid system: Common carotid artery widely patent to the bifurcation. No atherosclerotic plaque at the bifurcation or ICA bulb. Cervical ICA widely patent, diameter 3.5-4 mm. Vertebral arteries: Both vertebral artery origins are widely patent. Both vertebral arteries are widely patent through the cervical region to the foramen magnum. Skeleton: Ordinary mid cervical spondylosis. Other neck: No mass or lymphadenopathy. Upper chest: Negative Review of the MIP images confirms the above findings CTA HEAD FINDINGS Anterior circulation: There is extensive atherosclerotic calcification in the carotid siphon region on each side. No stenosis greater than 30-50% is suspected. Supraclinoid internal carotid arteries are widely patent. The anterior and middle cerebral  vessels are patent without proximal stenosis, aneurysm or vascular malformation. No missing vessels identified. Posterior circulation: Both vertebral arteries widely patent through the foramen magnum to the basilar. Posterior inferior cerebellar arteries show flow. No basilar stenosis. Superior cerebellar and posterior cerebral arteries appear widely patent. Venous sinuses: Patent and normal. Anatomic variants: None significant Delayed phase: No abnormal enhancement Review of the MIP images confirms the above findings IMPRESSION: No large or medium vessel occlusion. No significant carotid bifurcation disease. No stenosis on the left. Focal calcified plaque at the distal bulb on the right but without stenosis when compared to the more distal cervical ICA. 50% stenosis the left subclavian artery  just beyond the origin. Peripheral calcification throughout both carotid siphon regions. Generalized narrowing throughout the siphon regions, with stenosis estimated in the 30-50% range. These results were communicated to Dr. Leonel Ramsay At 2:43 pmon 1/9/2019by text page via the [facility] messaging system. Electronically Signed   By: Nelson Chimes M.D.   On: 01/04/2018 14:45   Ct Head Code Stroke Wo Contrast  Result Date: 01/04/2018 CLINICAL DATA:  Code stroke. Left-sided facial droop. Last seen normal 3-1/2 hours ago. EXAM: CT HEAD WITHOUT CONTRAST TECHNIQUE: Contiguous axial images were obtained from the base of the skull through the vertex without intravenous contrast. COMPARISON:  11/24/2016 CT.  02/17/2016 MRI. FINDINGS: Brain: Generalized atrophy. Advanced widespread chronic small-vessel ischemic changes the hemispheric white matter. Old infarction left thalamus. No sign of acute infarction, mass lesion, hemorrhage, hydrocephalus or extra-axial collection. Vascular: There is atherosclerotic calcification of the major vessels at the base of the brain. Skull: Negative Sinuses/Orbits: Clear/normal Other: None ASPECTS ([facility] Stroke Program Early CT Score) - Ganglionic level infarction (caudate, lentiform nuclei, internal capsule, insula, M1-M3 cortex): 7 - Supraganglionic infarction (M4-M6 cortex): 3 Total score (0-10 with 10 being normal): 10 IMPRESSION: 1. No acute finding. Advanced chronic small-vessel ischemic changes throughout the hemispheric white matter. Old left thalamic infarction. 2. ASPECTS is 10. 3. These results were communicated to Dr. Leonel Ramsay At 2:12 pmon 1/9/2019by text page via the [facility] messaging system. Electronically Signed   By: Nelson Chimes M.D.   On: 01/04/2018 14:14   STUDIES:  1/9 Code stroke head CT >> 1. No acute finding. Advanced chronic small-vessel ischemic changes throughout the hemispheric white matter. Old left thalamic infarction. 2. ASPECTS is 10.  1/9 CTA head/  neck >> 1.  No large or medium vessel occlusion. 2.  No significant carotid bifurcation disease. No stenosis on the left. Focal calcified plaque at the distal bulb on the right but without stenosis when compared to the more distal cervical ICA. 3.  50% stenosis the left subclavian artery just beyond the origin. 4.  Peripheral calcification throughout both carotid siphon regions. Generalized narrowing throughout the siphon regions, with stenosis estimated in the 30-50% range.  1/9 CT head >> 1. New intraventricular hemorrhage layering in the occipital horn on the left. No hydrocephalus. 2. No visible acute gray matter infarct. 3. Severe white matter disease with volume loss. Remote lacunar infarct in the left thalamus.  CULTURES: 1/9 MRSA PCR >>  ANTIBIOTICS: none  SIGNIFICANT EVENTS: 1/9 Admit  LINES/TUBES: PIV x 2  DISCUSSION: 73 yof SNF resident with prior 2 CVA, LKW 1130 presenting with left sided deficits, initial head CT neg, given TPA with development in acute agitation s/p ativan, speech, and hypertension with repeat head CT showing acute IVH.  Given cryo and TXA.  PCCM consulted for airway management.  ASSESSMENT / PLAN:  PULMONARY A: High  risk for intubation given acute IVH - currently maintaining oxygenation on 2L [place], no desaturations - s/p 2mg  ativan at 1633 P:   Monitor in ICU Supplemental O2 for sats > 94% high risk for intubation given AMS, currently protecting airway Aspiration precautions Will need SLP when appropriate   CARDIOVASCULAR A:  Hypertensive crisis  Acute CVA Hx HLD  - failed BP goals on labetalol and cardene, changed to cleviprex  - troponin neg P:  Tele monitoring cleviprex for SBP < 160 > 120 TTE, carotid dopplers, lipid panel pending  RENAL A:   Mild hyperkalemia  P:   Trend BMP / urinary output/ daily weight  Replace electrolytes as indicated  GASTROINTESTINAL A:   NPO with AMS P:   Continue PPI  Swallow eval   Repeat LFTs in am  HEMATOLOGIC A:   S/p TPA f/b TXA and cryo for acute IVH Acute allergic reaction to IV contrast P:  Trend CBC  Monitor for further bleeding S/p benadryl and solumedrol- continue solumedrol 40mg  q 8 hr x 3, caution for benadryl given current mental status, address on prn basis   INFECTIOUS A:   No acute concerns P:   Monitor fever curve/ trend WBC   ENDOCRINE A:   DM P:   CBG q 4  HgbA1c pending  NEUROLOGIC A:   Acute CVA s/p TPA with hemorraghic transformation s/p TXA and cryo Acute encephalopathy- related to ativan and concern for progressing IVH, monitor development of hydrocephalus  Concern for seizures Hx 2 prior CVA's (most recent 2017); wheelchair bound at SNF at baseline, arthritis P:   Per Neuro Repeat Head CT planned for this evening per Neuro Neuro checks per protocol- frequent BP goals as above EEG pending Seizure precautions MRA/ MRI when able   FAMILY  - Updates: No family at bedside.  Neuro attempted to call brother, unable to reach.   - Inter-disciplinary family meet or Palliative Care meeting due by:  1/16  CCT 45 mins  Kennieth Rad, AGACNP-BC [place] Pgr: [phone] or if no answer [phone] 01/05/2018, 12:15 PM

## 2018-01-05 NOTE — Progress Notes (Addendum)
PULMONARY / CRITICAL CARE MEDICINE   Name: Sheila [name] MRN: [id number] DOB: [birth year]    ADMISSION DATE:  01/04/2018 CONSULTATION DATE:  01/04/2018  REFERRING MD:  Leonel Ramsay   CHIEF COMPLAINT:  CVA  HISTORY OF PRESENT ILLNESS:   HPI obtained from medical chart review as patient is acutely encephalopathic.    This is a 67 year old African American female nursing home [facility]) resident was seen on 01/04/2018 in consultation at the request of Dr. Roland Rack for recommendations on further evaluation and management of post-thrombolytic care and possible need for endotracheal intubation. She presented with L facial droop, L hemineglect, L hemianopia and confusion. CT was negative for acute findings. The patient underwent tPA administration (1424) for stroke and expreienced hemorrhagic transformation (intraventricular hemorrhage), which was associated with acute change in mental status. She was obtunded but appeared to be protecting her airway.  Cryoprecipitate and tranexamic acid given. Her course has also been complicated by hives after IV contrast administration for CTA (treated with benadryl and SoluMedrol).  67 year old female past medical history as below significant for but not limited to to prior strokes with residual right foot drop, diabetes, arthritis and hyperlipidemia who presented with altered mental status.  - Interim events: much more interactive today. Level of attention seems to wax and wane rapidly. Follows some commands. Answers some questions. Showing improvement in gross motor function of LUE and some flickering movement in LLE. On empiric antibiotic coverage for meningitis.  Past Medical History:  Diagnosis Date  . Arthritis   . Diabetes mellitus    complicated by peripheral neuropathy  . Hyperlipidemia   . Stroke [facility]) 2015   residual right foot drop   Past Surgical History:  Procedure Laterality Date  . CHOLECYSTECTOMY    . TONSILLECTOMY       Current Facility-Administered Medications:  .   stroke: mapping our early stages of recovery book, , Does not apply, Once, Greta Doom, MD .  [COMPLETED] alteplase (ACTIVASE) 1 mg/mL infusion 77 mg, 77 mg, Intravenous, Once, Stopped at 01/04/18 1520 **FOLLOWED BY** 0.9 %  sodium chloride infusion, 50 mL, Intravenous, Once, Roland Rack P, MD .  0.9 %  sodium chloride infusion, , Intravenous, Continuous, Greta Doom, MD, Last Rate: 75 mL/hr at 01/05/18 0224 .  0.9 %  sodium chloride infusion, , Intravenous, Once, Leonel Ramsay, Vida Roller, MD .  acetaminophen (TYLENOL) tablet 650 mg, 650 mg, Oral, Q4H PRN **OR** acetaminophen (TYLENOL) solution 650 mg, 650 mg, Per Tube, Q4H PRN **OR** acetaminophen (TYLENOL) suppository 650 mg, 650 mg, Rectal, Q4H PRN, Greta Doom, MD, 650 mg at 01/04/18 1724 .  acyclovir (ZOVIRAX) 655 mg in dextrose 5 % 100 mL IVPB, 10 mg/kg (Order-Specific), Intravenous, Q8H, Greta Doom, MD, Stopped at 01/05/18 [phone] .  ampicillin (OMNIPEN) 2 g in sodium chloride 0.9 % 50 mL IVPB, 2 g, Intravenous, Q6H, Greta Doom, MD, Stopped at 01/05/18 [phone]  cefTRIAXone (ROCEPHIN) 2 g in dextrose 5 % 50 mL IVPB, 2 g, Intravenous, Q12H, Greta Doom, MD, Stopped at 01/05/18 1042 .  clevidipine (CLEVIPREX) infusion 0.5 mg/mL, 0-21 mg/hr, Intravenous, Continuous, Greta Doom, MD, Stopped at 01/05/18 0245 .  famotidine (PEPCID) IVPB 20 mg premix, 20 mg, Intravenous, Q12H, Corey Harold, NP, Stopped at 01/05/18 1009 .  insulin aspart (novoLOG) injection 0-15 Units, 0-15 Units, Subcutaneous, Q4H, Flora Lipps, MD, 2 Units at 01/05/18 1206 .  MEDLINE mouth rinse, 15 mL, Mouth Rinse, BID, Leonel Ramsay,  Vida Roller, MD, 15 mL at 01/05/18 0943 .  methylPREDNISolone sodium succinate (SOLU-MEDROL) 125 mg/2 mL injection 125 mg, 125 mg, Intravenous, Once, Kirkpatrick, Vida Roller, MD .  methylPREDNISolone sodium succinate  (SOLU-MEDROL) 40 mg/mL injection 40 mg, 40 mg, Intravenous, Q12H, Simpson, Paula B, NP, 40 mg at 01/05/18 0519 .  nicardipine (CARDENE) 20mg  in 0.86% saline 268ml IV infusion (0.1 mg/ml), 3-15 mg/hr, Intravenous, Once, Greta Doom, MD, Last Rate: 150 mL/hr at 01/04/18 1638, 15 mg/hr at 01/04/18 1638 .  vancomycin (VANCOCIN) IVPB 1000 mg/200 mL premix, 1,000 mg, Intravenous, Q12H, Rumbarger, Valeda Malm, RPH   Social History   Tobacco Use  . Smoking status: Former Smoker    Types: Cigarettes    Last attempt to quit: 02/15/1992    Years since quitting: 25.9  . Smokeless tobacco: Never Used  Substance Use Topics  . Alcohol use: No    Alcohol/week: 0.0 oz    Comment: Socially  . Drug use: No    Comment: SMOKES IT ANY TIME SHE CAN GET IT   REVIEW OF SYSTEMS:  unable to assess due to patient's inability to communicate   SUBJECTIVE:  More interactive today. Definitely moving L side a little more.  VITAL SIGNS: BP 122/60   Pulse 80   Temp 97.9 F (36.6 C)   Resp 16   Ht 5\' 3"  (1.6 m)   Wt 85.2 kg (187 lb 13.3 oz)   SpO2 100%   BMI 33.27 kg/m   HEMODYNAMICS:    VENTILATOR SETTINGS:    INTAKE / OUTPUT: I/O last 3 completed shifts: In: 2472.4 [I.V.:1332.4; Blood:240; IV ZOXWRUEAV:409] Out: 1000 [Urine:1000]  PHYSICAL EXAMINATION: General: Acute on chronically ill elderly female obtunded in bed HEENT: lips dry, right gaze, pupils 4/=/reactive, arcus senilis. Mucous membranes moist. Neuro: arousable, will arouse to verbal. Motor: 3/5 RUE, 4/5 LUE, 2/5 RLE, 4/5 LLE CV: ST, no m/r/g PULM: no acute distress, regular, lungs clear, diminished at bases GI: obese, soft,  bs active, purwick in place Extremities: warm/dry, +2 BLE, left hand with bruising, left anterior foot and lateral ankle warm with erythema Skin: very mild generalized resolving hives    LABS:  BMET Recent Labs  Lab 01/04/18 1356 01/04/18 1407  NA 141 141  K 5.3* 5.2*  CL 108 106  CO2 25  --    BUN 9 13  CREATININE 0.98 0.90  GLUCOSE 100* 99    Electrolytes Recent Labs  Lab 01/04/18 1356  CALCIUM 9.5    CBC Recent Labs  Lab 01/04/18 1356 01/04/18 1407 01/05/18 0435  WBC 9.6  --  9.7  HGB 12.5 12.6 10.8*  HCT 36.9 37.0 31.9*  PLT 259  --  296    Coag's Recent Labs  Lab 01/04/18 1356  APTT 28  INR 0.97    Sepsis Markers No results for input(s): LATICACIDVEN, PROCALCITON, O2SATVEN in the last 168 hours.  ABG No results for input(s): PHART, PCO2ART, PO2ART in the last 168 hours.  Liver Enzymes Recent Labs  Lab 01/04/18 1356  AST 42*  ALT 21  ALKPHOS 117  BILITOT 1.4*  ALBUMIN 3.8    Cardiac Enzymes No results for input(s): TROPONINI, PROBNP in the last 168 hours.  Glucose Recent Labs  Lab 01/04/18 1359 01/04/18 2010 01/04/18 2329 01/05/18 0336 01/05/18 0815 01/05/18 1203  GLUCAP 96 218* 260* 150* 115* 125*    Imaging Ct Angio Head W Or Wo Contrast  Result Date: 01/04/2018 CLINICAL DATA:  Code stroke.  Left facial  droop. EXAM: CT ANGIOGRAPHY HEAD AND NECK TECHNIQUE: Multidetector CT imaging of the head and neck was performed using the standard protocol during bolus administration of intravenous contrast. Multiplanar CT image reconstructions and MIPs were obtained to evaluate the vascular anatomy. Carotid stenosis measurements (when applicable) are obtained utilizing NASCET criteria, using the distal internal carotid diameter as the denominator. CONTRAST:  50 cc Isovue 370 COMPARISON:  CT earlier same day.  Multiple examinations 2017. FINDINGS: CTA NECK FINDINGS Aortic arch: No aneurysm or dissection. Mild atherosclerotic plaque without calcification. Branching pattern of the brachiocephalic vessels is normal. Atherosclerotic calcification of the proximal left subclavian artery with tortuosity. 50% stenosis at the origin of that vessel. Right carotid system: Common carotid artery widely patent to the bifurcation. Calcified plaque in the ICA  bulb. No stenosis when compared to the more distal cervical ICA diameter of 3.5-4 mm. Left carotid system: Common carotid artery widely patent to the bifurcation. No atherosclerotic plaque at the bifurcation or ICA bulb. Cervical ICA widely patent, diameter 3.5-4 mm. Vertebral arteries: Both vertebral artery origins are widely patent. Both vertebral arteries are widely patent through the cervical region to the foramen magnum. Skeleton: Ordinary mid cervical spondylosis. Other neck: No mass or lymphadenopathy. Upper chest: Negative Review of the MIP images confirms the above findings CTA HEAD FINDINGS Anterior circulation: There is extensive atherosclerotic calcification in the carotid siphon region on each side. No stenosis greater than 30-50% is suspected. Supraclinoid internal carotid arteries are widely patent. The anterior and middle cerebral vessels are patent without proximal stenosis, aneurysm or vascular malformation. No missing vessels identified. Posterior circulation: Both vertebral arteries widely patent through the foramen magnum to the basilar. Posterior inferior cerebellar arteries show flow. No basilar stenosis. Superior cerebellar and posterior cerebral arteries appear widely patent. Venous sinuses: Patent and normal. Anatomic variants: None significant Delayed phase: No abnormal enhancement Review of the MIP images confirms the above findings IMPRESSION: No large or medium vessel occlusion. No significant carotid bifurcation disease. No stenosis on the left. Focal calcified plaque at the distal bulb on the right but without stenosis when compared to the more distal cervical ICA. 50% stenosis the left subclavian artery just beyond the origin. Peripheral calcification throughout both carotid siphon regions. Generalized narrowing throughout the siphon regions, with stenosis estimated in the 30-50% range. These results were communicated to Dr. Leonel Ramsay At 2:43 pmon 1/9/2019by text page via the [facility]  messaging system. Electronically Signed   By: Nelson Chimes M.D.   On: 01/04/2018 14:45   Ct Head Wo Contrast  Result Date: 01/05/2018 CLINICAL DATA:  Stroke follow-up EXAM: CT HEAD WITHOUT CONTRAST TECHNIQUE: Contiguous axial images were obtained from the base of the skull through the vertex without intravenous contrast. COMPARISON:  Head CT 01/04/2018 FINDINGS: Examination is degraded by motion. Brain: Unchanged distribution intraventricular blood within the occipital horns of both lateral ventricles. Size and configuration of the ventricles are unchanged. There is periventricular hypoattenuation compatible with chronic microvascular disease. No new site of hemorrhage. Vascular: No hyperdense vessel or unexpected calcification. Skull: Normal. Negative for fracture or focal lesion. Sinuses/Orbits: No acute finding. Other: None. IMPRESSION: Unchanged appearance of intraventricular blood within the occipital horns of both lateral ventricles. No new site of hemorrhage or new ventricular dilatation. Electronically Signed   By: Ulyses Jarred M.D.   On: 01/05/2018 05:15   Ct Head Wo Contrast  Result Date: 01/04/2018 CLINICAL DATA:  Status post tPA.  Known intracranial hemorrhage. EXAM: CT HEAD WITHOUT CONTRAST TECHNIQUE: Contiguous axial images  were obtained from the base of the skull through the vertex without intravenous contrast. COMPARISON:  Head CT 01/04/2018 at 4 p.m. FINDINGS: Brain: Unchanged appearance of blood within the occipital horn of the left lateral ventricle. There is now also blood in the occipital horn of the right lateral ventricle, which may be secondary to redistribution. There is periventricular hypoattenuation compatible with chronic microvascular disease. No midline shift or mass effect. The size and configuration of the ventricles are unchanged. There is no new site of hemorrhage. Vascular: No hyperdense vessel or unexpected calcification. Skull: Normal. Negative for fracture or focal  lesion. Sinuses/Orbits: No acute finding. Other: None. IMPRESSION: 1. Suspected redistribution of intraventricular blood, now within both occipital horns of the lateral ventricles. 2. Unchanged size and configuration of the ventricles. 3. No new site of hemorrhage. Electronically Signed   By: Ulyses Jarred M.D.   On: 01/04/2018 22:13   Ct Head Wo Contrast  Result Date: 01/04/2018 CLINICAL DATA:  Change in status after tPA.  Stroke. EXAM: CT HEAD WITHOUT CONTRAST TECHNIQUE: Contiguous axial images were obtained from the base of the skull through the vertex without intravenous contrast. COMPARISON:  Head CT and CTA from earlier today FINDINGS: Brain: New intraventricular hemorrhage layering in the occipital horn of the left lateral ventricle. Stable ventricular volume that is enlarged due to atrophy. No detected gray matter infarct. There is advanced presumed chronic small vessel ischemia in the white matter with volume loss. Remote lacunar infarct in the left thalamus. Vascular: Atherosclerotic calcification.  No hyperdense vessel. Skull: Negative Sinuses/Orbits: Gaze to the right.  History of neglect. Other: Critical Value/emergent results were called by telephone at the time of interpretation on 01/04/2018 at 4:18 pm to Dr. Roland Rack , who verbally acknowledged these results. IMPRESSION: 1. New intraventricular hemorrhage layering in the occipital horn on the left. No hydrocephalus. 2. No visible acute gray matter infarct. 3. Severe white matter disease with volume loss. Remote lacunar infarct in the left thalamus. Electronically Signed   By: Monte Fantasia M.D.   On: 01/04/2018 16:21   Ct Angio Neck W Or Wo Contrast  Result Date: 01/04/2018 CLINICAL DATA:  Code stroke.  Left facial droop. EXAM: CT ANGIOGRAPHY HEAD AND NECK TECHNIQUE: Multidetector CT imaging of the head and neck was performed using the standard protocol during bolus administration of intravenous contrast. Multiplanar CT image  reconstructions and MIPs were obtained to evaluate the vascular anatomy. Carotid stenosis measurements (when applicable) are obtained utilizing NASCET criteria, using the distal internal carotid diameter as the denominator. CONTRAST:  50 cc Isovue 370 COMPARISON:  CT earlier same day.  Multiple examinations 2017. FINDINGS: CTA NECK FINDINGS Aortic arch: No aneurysm or dissection. Mild atherosclerotic plaque without calcification. Branching pattern of the brachiocephalic vessels is normal. Atherosclerotic calcification of the proximal left subclavian artery with tortuosity. 50% stenosis at the origin of that vessel. Right carotid system: Common carotid artery widely patent to the bifurcation. Calcified plaque in the ICA bulb. No stenosis when compared to the more distal cervical ICA diameter of 3.5-4 mm. Left carotid system: Common carotid artery widely patent to the bifurcation. No atherosclerotic plaque at the bifurcation or ICA bulb. Cervical ICA widely patent, diameter 3.5-4 mm. Vertebral arteries: Both vertebral artery origins are widely patent. Both vertebral arteries are widely patent through the cervical region to the foramen magnum. Skeleton: Ordinary mid cervical spondylosis. Other neck: No mass or lymphadenopathy. Upper chest: Negative Review of the MIP images confirms the above findings CTA HEAD FINDINGS  Anterior circulation: There is extensive atherosclerotic calcification in the carotid siphon region on each side. No stenosis greater than 30-50% is suspected. Supraclinoid internal carotid arteries are widely patent. The anterior and middle cerebral vessels are patent without proximal stenosis, aneurysm or vascular malformation. No missing vessels identified. Posterior circulation: Both vertebral arteries widely patent through the foramen magnum to the basilar. Posterior inferior cerebellar arteries show flow. No basilar stenosis. Superior cerebellar and posterior cerebral arteries appear widely patent.  Venous sinuses: Patent and normal. Anatomic variants: None significant Delayed phase: No abnormal enhancement Review of the MIP images confirms the above findings IMPRESSION: No large or medium vessel occlusion. No significant carotid bifurcation disease. No stenosis on the left. Focal calcified plaque at the distal bulb on the right but without stenosis when compared to the more distal cervical ICA. 50% stenosis the left subclavian artery just beyond the origin. Peripheral calcification throughout both carotid siphon regions. Generalized narrowing throughout the siphon regions, with stenosis estimated in the 30-50% range. These results were communicated to Dr. Leonel Ramsay At 2:43 pmon 1/9/2019by text page via the [facility] messaging system. Electronically Signed   By: Nelson Chimes M.D.   On: 01/04/2018 14:45   Ct Head Code Stroke Wo Contrast  Result Date: 01/04/2018 CLINICAL DATA:  Code stroke. Left-sided facial droop. Last seen normal 3-1/2 hours ago. EXAM: CT HEAD WITHOUT CONTRAST TECHNIQUE: Contiguous axial images were obtained from the base of the skull through the vertex without intravenous contrast. COMPARISON:  11/24/2016 CT.  02/17/2016 MRI. FINDINGS: Brain: Generalized atrophy. Advanced widespread chronic small-vessel ischemic changes the hemispheric white matter. Old infarction left thalamus. No sign of acute infarction, mass lesion, hemorrhage, hydrocephalus or extra-axial collection. Vascular: There is atherosclerotic calcification of the major vessels at the base of the brain. Skull: Negative Sinuses/Orbits: Clear/normal Other: None ASPECTS ([place] Stroke Program Early CT Score) - Ganglionic level infarction (caudate, lentiform nuclei, internal capsule, insula, M1-M3 cortex): 7 - Supraganglionic infarction (M4-M6 cortex): 3 Total score (0-10 with 10 being normal): 10 IMPRESSION: 1. No acute finding. Advanced chronic small-vessel ischemic changes throughout the hemispheric white matter. Old left  thalamic infarction. 2. ASPECTS is 10. 3. These results were communicated to Dr. Leonel Ramsay At 2:12 pmon 1/9/2019by text page via the [facility] messaging system. Electronically Signed   By: Nelson Chimes M.D.   On: 01/04/2018 14:14   STUDIES:  1/9 Code stroke head CT >> 1. No acute finding. Advanced chronic small-vessel ischemic changes throughout the hemispheric white matter. Old left thalamic infarction. 2. ASPECTS is 10.  1/9 CTA head/ neck >> 1.  No large or medium vessel occlusion. 2.  No significant carotid bifurcation disease. No stenosis on the left. Focal calcified plaque at the distal bulb on the right but without stenosis when compared to the more distal cervical ICA. 3.  50% stenosis the left subclavian artery just beyond the origin. 4.  Peripheral calcification throughout both carotid siphon regions. Generalized narrowing throughout the siphon regions, with stenosis estimated in the 30-50% range.  1/9 CT head >> 1. New intraventricular hemorrhage layering in the occipital horn on the left. No hydrocephalus. 2. No visible acute gray matter infarct. 3. Severe white matter disease with volume loss. Remote lacunar infarct in the left thalamus.  CULTURES: 1/9 MRSA PCR >>  ANTIBIOTICS: none  SIGNIFICANT EVENTS: 1/9 Admit  LINES/TUBES: PIV x 2  DISCUSSION: 55 yof SNF resident with prior 2 CVA, LKW 1130 presenting with left sided deficits, initial head CT neg, given TPA with development  in acute agitation s/p ativan, speech, and hypertension with repeat head CT showing acute IVH.  Given cryo and TXA.  PCCM consulted for airway management.  ASSESSMENT / PLAN:  PULMONARY A: High risk for intubation given acute IVH  P: Case discussed with Dr. Leonie Man. Overall, may have started to improve. May not need intubation for airway protection Supplemental O2 for sats > 93% Aspiration precautions Will need SLP when appropriate   CARDIOVASCULAR A: Hypertensive crisis  Acute CVA  with IVH Hx HLD  - failed BP goals on labetalol and cardene, changed to cleviprex  - troponin neg P:  Tele monitoring Start Vasotec. Titrate Cleviprex for SBP < 160 TTE, carotid dopplers, lipid panel pending  RENAL A: Mild hyperkalemia  P: Trend BMP / urinary output/ daily weight  Replace electrolytes as indicated  GASTROINTESTINAL A:   NPO with AMS P:   Continue PPI  Swallow eval  Repeat LFTs in am  HEMATOLOGIC A:   s/p tPA f/b TXA and cryo for acute IVH Acute allergic reaction to IV contrast P:  Trend CBC  Monitor for further bleeding S/p Benadryl and SoluMedrol. Stop SoluMedrol.   INFECTIOUS A: No acute concerns. On empiric antibiotic coverage for meningitis. P: Monitor fever curve/ trend WBC. Stop antibiotics if OK with ID.   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 Add Vasotec. Wean Cleviprex. EEG pending Seizure precautions MRA/MRI when able   FAMILY  - Updates: No family at bedside.  Neuro attempted to call brother, unable to reach.   - Inter-disciplinary family meet or Palliative Care meeting due by:  1/16   Sathvika Pain, MD  [facility] Pgr: [phone]  01/05/2018, 12:21 PM

## 2018-01-06 DIAGNOSIS — I615 Nontraumatic intracerebral hemorrhage, intraventricular: Principal | ICD-10-CM

## 2018-01-06 DIAGNOSIS — I169 Hypertensive crisis, unspecified: Secondary | ICD-10-CM

## 2018-01-06 LAB — URINE CULTURE: CULTURE: NO GROWTH

## 2018-01-06 LAB — BASIC METABOLIC PANEL
ANION GAP: 9 (ref 5–15)
BUN: 8 mg/dL (ref 6–20)
CO2: 25 mmol/L (ref 22–32)
Calcium: 8.6 mg/dL — ABNORMAL LOW (ref 8.9–10.3)
Chloride: 107 mmol/L (ref 101–111)
Creatinine, Ser: 1.03 mg/dL — ABNORMAL HIGH (ref 0.44–1.00)
GFR calc Af Amer: >60 mL/min (ref >60)
GFR, EST NON AFRICAN AMERICAN: 55 mL/min — AB (ref >60)
GLUCOSE: 217 mg/dL — AB (ref 65–99)
POTASSIUM: 2.9 mmol/L — AB (ref 3.5–5.1)
Sodium: 141 mmol/L (ref 135–145)

## 2018-01-06 LAB — GLUCOSE, CAPILLARY
GLUCOSE-CAPILLARY: 63 mg/dL — AB (ref 65–99)
GLUCOSE-CAPILLARY: 111 mg/dL — AB (ref 65–99)
GLUCOSE-CAPILLARY: 108 mg/dL — AB (ref 65–99)
GLUCOSE-CAPILLARY: 115 mg/dL — AB (ref 65–99)
Glucose-Capillary: 125 mg/dL — ABNORMAL HIGH (ref 65–99)
Glucose-Capillary: 185 mg/dL — ABNORMAL HIGH (ref 65–99)

## 2018-01-06 MED ORDER — HYDRALAZINE HCL 20 MG/ML IJ SOLN: 10.0000 mg | INTRAMUSCULAR | Status: DC | PRN

## 2018-01-06 MED ORDER — DEXTROSE 50 % IV SOLN
INTRAVENOUS | Status: AC
  Administered 2018-01-06: 25 mL
  Filled 2018-01-06: qty 50

## 2018-01-06 MED ORDER — PANTOPRAZOLE SODIUM 40 MG PO TBEC
40.0000 mg | DELAYED_RELEASE_TABLET | Freq: Every day | ORAL | Status: DC
Start: 2018-01-06 — End: 2018-01-10
  Administered 2018-01-06 – 2018-01-10 (×5): 40 mg via ORAL
  Filled 2018-01-06 (×5): qty 1

## 2018-01-06 MED ORDER — LABETALOL HCL 5 MG/ML IV SOLN: 10.0000 mg | INTRAVENOUS | Status: DC | PRN

## 2018-01-06 MED ORDER — ENALAPRIL MALEATE 10 MG PO TABS
10.0000 mg | ORAL_TABLET | Freq: Two times a day (BID) | ORAL | Status: DC
  Administered 2018-01-07 – 2018-01-08 (×5): 10 mg via ORAL
  Filled 2018-01-06 (×6): qty 1

## 2018-01-06 NOTE — Progress Notes (Signed)
PCCM previously following for airway observation.  Patient has been transferred out of ICU.  She is awake, says hello with a strong voice.  Normal work of breathing with sats 97-100% on room air.  PCCM will be available as needed.  Please call back if new needs arise.    Noe Gens, NP-C [facility] Pgr: [phone] or if no answer [phone] 01/06/2018, 8:52 AM

## 2018-01-06 NOTE — Progress Notes (Signed)
OT Cancellation Note  Patient Details Name: Sheila [name] MRN: [id number] DOB: 1951/01/18   Cancelled Treatment:    Reason Eval/Treat Not Completed: Patient not medically ready. Pt remains on strict bedrest, RN paged MD.   Malka So 01/06/2018, 10:51 AM  01/06/2018 Nestor Lewandowsky, OTR/L Pager: [phone]

## 2018-01-06 NOTE — Progress Notes (Signed)
Pt CBG rechecked. CBG is 111 and within parameters. Will continue to monitor and assess.

## 2018-01-06 NOTE — Progress Notes (Signed)
NT [place] notified this RN of CBG of 63 at 0410. This nurse then implemented the standing orders for hypoglycemia.   Pt is NPO. Will give 25 ml of D50 IV.

## 2018-01-06 NOTE — Care Management Note (Signed)
Case Management Note  Patient Details  Name: Sheila [name] MRN: [id number] Date of Birth: September 29, 1951  Subjective/Objective:   From [facility], presents with acute IVH, acute encephalopahty, anemia, hyperkalemia, uti, htn, hld.               Action/Plan: NCM will follow along with CSW  For dc needs.   Expected Discharge Date:                  Expected Discharge Plan:  Skilled Nursing Facility  In-House Referral:  Clinical Social Work  Discharge planning Services  CM Consult  Post Acute Care Choice:    Choice offered to:     DME Arranged:    DME Agency:     HH Arranged:    [place] Agency:     Status of Service:  In process, will continue to follow  If discussed at Long Length of Stay Meetings, dates discussed:    Additional Comments:  Zenon Mayo, RN 01/06/2018, 5:49 PM

## 2018-01-06 NOTE — Progress Notes (Signed)
PT Cancellation Note  Patient Details Name: Sheila [name] MRN: [id number] DOB: 1951-04-26   Cancelled Treatment:    Reason Eval/Treat Not Completed: Medical issues which prohibited therapy(Pt still on strict bedrest per order. MD has been paged.  )   Denice Paradise 01/06/2018, 10:47 AM  [facility] [phone] (pager)

## 2018-01-06 NOTE — Progress Notes (Signed)
NEUROHOSPITALISTS STROKE TEAM - DAILY PROGRESS NOTE   ADMISSION HISTORY:  Sheila [name] is an 67 y.o. female with a history of 2 previous strokes the most recent of which was a left thalamic infarct in February 2017 with residual right foot drop. At that time she was seen by Dr. Jaynee Eagles and switched from aspirin to Plavix. Prior to that she had a right CVA details not available. Other medical problems include hyperlipidemia, depression, history of low back pain, diabetes mellitus with neuropathy, peripheral arterial disease, and arthritis.   The patient is currently a resident at the [facility], a nursing facility. Today at 11:30 AM she pulled her wheelchair away from the table and began to spin in circles. The patient was evaluated by a nurse, EMS was summoned, and a code stroke was initiated. An initial head CT at [facility] today showed no acute findings. The old left thalamic infarct was noted as well as advanced chronic small vessel ischemic changes. Dr. Leonel Ramsay was unable to contact the patient's family. A decision was made to proceed with IV TPA which was given at 1424.   LSN: 01/04/2018 at 11:30 AM tPA Given: IV TPA was given at 1424 on 01/04/2018.  Per the nursing tech who witnessed the event.  The patient became suddenly agitated, put her right arm to her head and pushed back from the table that she was sitting at.  At that point, the tach realize she was only pushing with her right leg and therefore was "spinning in circles" as she was trying to push away.  She was not moving the left arm or leg therefore EMS was called.  En route, the left arm and leg weakness improved, however she was still suffering from a left facial droop, severe left hemineglect, left hemianopia, as well as severe confusion.  Given this picture, she was given IV TPA. Family was not present so was administered with emergency consent after  consultation with ER physician.   Following IV tPA, the patient remained quite agitated, and this plus her paucity of speech prompted a stat EEG.   SUBJECTIVE (INTERVAL HISTORY)  No family is at the bedside. Patient is found laying in bed in NAD. Patient is alert and intaeractive.   following commands consistently.  No other new/acute events reported overnight.  Blood pressure has been controlled.   .  No family at the bedside   OBJECTIVE Lab Results: CBC:  Recent Labs  Lab 01/04/18 1356 01/04/18 1407 01/05/18 0435  WBC 9.6  --  9.7  HGB 12.5 12.6 10.8*  HCT 36.9 37.0 31.9*  MCV 86.8  --  85.3  PLT 259  --  296   BMP: Recent Labs  Lab 01/04/18 1356 01/04/18 1407 01/05/18 1910  NA 141 141 141  K 5.3* 5.2* 4.9  CL 108 106 107  CO2 25  --  21*  GLUCOSE 100* 99 147*  BUN 9 13 13   CREATININE 0.98 0.90 1.11*  CALCIUM 9.5  --  8.8*   Liver Function Tests:  Recent Labs  Lab 01/04/18 1356 01/05/18 1910  AST 42* 47*  ALT 21 25  ALKPHOS 117 84  BILITOT 1.4* 0.9  PROT 6.9 6.1*  ALBUMIN 3.8 3.4*   Coagulation Studies:  Recent Labs    01/04/18 1356  APTT 28  INR 0.97    Microbiology: Blood Cx - NGTF Urine Cx - Pending Urinalysis:  Recent Labs  Lab 01/05/18 0926  [place]  APPEARANCEUR CLEAR  LABSPEC 1.013  PHURINE 6.0  GLUCOSEU 150*  HGBUR SMALL*  BILIRUBINUR NEGATIVE  KETONESUR NEGATIVE  PROTEINUR NEGATIVE  NITRITE POSITIVE*  LEUKOCYTESUR SMALL*   Urine Drug Screen:     Component Value Date/Time   LABOPIA NONE DETECTED 01/05/2018 0926   COCAINSCRNUR NONE DETECTED 01/05/2018 0926   LABBENZ POSITIVE (A) 01/05/2018 0926   AMPHETMU NONE DETECTED 01/05/2018 0926   THCU NONE DETECTED 01/05/2018 0926   LABBARB NONE DETECTED 01/05/2018 0926    Alcohol Level:  Recent Labs  Lab 01/04/18 1356  ETH <10   PHYSICAL EXAM Temp:  [97.6 F (36.4 C)-98.8 F (37.1 C)] 97.7 F (36.5 C) (01/11 1230) Pulse Rate:  [77-99] 77 (01/11 1230) Resp:   [10-22] 16 (01/11 1230) BP: (97-184)/(37-120) 141/72 (01/11 1230) SpO2:  [91 %-100 %] 95 % (01/11 1230) General - Well nourished, well developed, in no apparent distress HEENT-  Normocephalic, Normal external eye/conjunctiva.  Normal external ears. Normal external nose, mucus membranes and septum.   Cardiovascular - Regular rate and rhythm  Respiratory - Lungs clear bilaterally. No wheezing. Abdomen - soft and non-tender, BS normal Extremities- no edema or cyanosis Neuro Exam : Mental Status: Patient is awake alert and interactive. She tells me her name and follows commands,  diminished attention and registration Cranial Nerves: II: Does not blink to threat from the left. Does from the right. PERRL  III,IV, VI: R gaze deviation.  V: VII: blinks to eyelid stimulaiton bilaterally.  XII: tongue is midline without atrophy or fasciculations.  Motor: She moves all extremities with good strength, no focal weakness  Sensory: No deficits  Cerebellar: Does not perform, but no clear ataxia.   IMAGING: I have personally reviewed the radiological images below and agree with the radiology interpretations.  Ct Head Wo Contrast Result Date: 01/05/2018 IMPRESSION: Unchanged appearance of intraventricular blood within the occipital horns of both lateral ventricles. No new site of hemorrhage or new ventricular dilatation. Electronically Signed   By: Ulyses Jarred M.D.   On: 01/05/2018 05:15   Ct Head Wo Contrast Result Date: 01/04/2018 IMPRESSION: 1. Suspected redistribution of intraventricular blood, now within both occipital horns of the lateral ventricles. 2. Unchanged size and configuration of the ventricles. 3. No new site of hemorrhage. Electronically Signed   By: Ulyses Jarred M.D.   On: 01/04/2018 22:13   Ct Head Wo Contrast Result Date: 01/04/2018 IMPRESSION: 1. New intraventricular hemorrhage layering in the occipital horn on the left. No hydrocephalus. 2. No visible acute gray matter infarct.  3. Severe white matter disease with volume loss. Remote lacunar infarct in the left thalamus. Electronically Signed   By: Monte Fantasia M.D.   On: 01/04/2018 16:21   Ct Angio Head Lavetta Nielsen W Or Wo Contrast Result Date: 01/04/2018 IMPRESSION: No large or medium vessel occlusion. No significant carotid bifurcation disease. No stenosis on the left. Focal calcified plaque at the distal bulb on the right but without stenosis when compared to the more distal cervical ICA. 50% stenosis the left subclavian artery just beyond the origin. Peripheral calcification throughout both carotid siphon regions. Generalized narrowing throughout the siphon regions, with stenosis estimated in the 30-50% range. These results were communicated to Dr. Leonel Ramsay At 2:43 pmon 1/9/2019by text page via the [facility] messaging system. Electronically Signed   By: Nelson Chimes M.D.   On: 01/04/2018 14:45   Mr Brain Wo Contrast Result Date: 01/05/2018 IMPRESSION: Motion degradation on multiple sequences. 1. Stable intraventricular hemorrhage pooling in left greater  than right occipital horns. Stable ventricle size. 2. No stroke, new acute hemorrhage, or significant mass effect identified. 3. Advanced chronic microvascular ischemic changes and parenchymal volume loss of the brain. Electronically Signed   By: Kristine Garbe M.D.   On: 01/05/2018 17:34   Ct Head Code Stroke Wo Contrast Result Date: 01/04/2018 IMPRESSION: 1. No acute finding. Advanced chronic small-vessel ischemic changes throughout the hemispheric white matter. Old left thalamic infarction. 2. ASPECTS is 10. 3. These results were communicated to Dr. Leonel Ramsay At 2:12 pmon 1/9/2019by text page via the [facility] messaging system. Electronically Signed   By: Nelson Chimes M.D.   On: 01/04/2018 14:14   Echocardiogram:                                               Study Conclusions - Left ventricle: The cavity size was normal. There was mild   concentric hypertrophy.  Systolic function was vigorous. The   estimated ejection fraction was in the range of 65% to 70%. Wall   motion was normal; there were no regional wall motion   abnormalities. Doppler parameters are consistent with abnormal   left ventricular relaxation (grade 1 diastolic dysfunction). - Mitral valve: Mildly calcified annulus. Valve area by continuity   equation (using LVOT flow): 1.79 cm^2. - Left atrium: The atrium was mildly dilated. - Pulmonary arteries: Systolic pressure was mildly increased. PA   peak pressure: 34 mm Hg (S).  Impressions:  No cardiac source of embolism was identified, but cannot be ruled   out on the basis of this examination.      EEG:                                                               EEG Abnormalities: 1) possible asymmetric PDR, though I do not think this is definite by the study. 2) generalized irregular slow activity Clinical Interpretation: This is a limited EEG is consistent with a generalized nonspecific cerebral dysfunction, with possible superimposed right hemispheric dysfunction.   There was no seizure or seizure predisposition recorded on this study     IMPRESSION: Ms. Sheila [name] is a 67 y.o. female with PMH of  2 previous strokes the most recent of which was a left thalamic infarct in February 2017 with a residual right foot drop, hyperlipidemia, diabetes mellitus, peripheral arterial disease, and arthritis brought to [facility] today from her nursing facility as a code stroke with reports of acute onset Left sided weakness.  IV TPA was given at 1424 on 01/04/2018.  Per the nursing tech who witnessed the event.  The patient became suddenly agitated, put her right arm to her head and pushed back from the table that she was sitting at.  At that point, the tech realized she was only pushing with her right leg and therefore was "spinning in circles" as she was trying to push away.  She was not moving the left arm or leg  therefore EMS was called.  En route, the left arm and leg weakness improved, however she was still suffering from a left facial droop, severe left hemineglect, left hemianopia, as well  as severe confusion.  Given this picture, she was given IV TPA. Family was not present so was administered with emergency consent after consultation with ER physician. Following IV tPA, the patient remained quite agitated, and this plus her paucity of speech prompted a stat EEG.   BP spiked following severe agitation, ativan given to confirm BP, following this it remained elevated and CT was obtained showing IVH. Giving cryoprecipitate and tranexamic acid  Patient with high fever, mental status changes on arrival. Meningitis may need to be considered as well. hence ordered empiric antibiotics.  Patient had repeat CTH. Increased IVH with no hydro. SBP 120-130s on Cleviprex. Exam essentially unchanged from prior exam with exception of decrease in level of arousal, but is able to open eyes to voice. Not following commands. Not agitated. D/W NSGY on call over phone - no neurosurgical intervention indicated at this time. Repeat CTH at 0400 hrs stable ICH  New intraventricular hemorrhage layering in the occipital horn on the left.  No stroke, new acute hemorrhage post TPA reversed with cryo and TXA no significant mass effect identified.  Suspected Etiology: Hemorrhage s/p IV tPA for stroke mimic Resultant Symptoms:  left facial droop, severe left hemineglect, left hemianopia, severe confusion. Now improved Stroke Risk Factors: diabetes mellitus, hyperlipidemia and hypertension Other Stroke Risk Factors: Advanced age, Obesity, Body mass index is 33.27 kg/m.  Hx stroke, PAD                                                                                 PLAN  01/06/2018: Continue Statin HOLD ASA until 24 hour post tPA neuroimaging is stable & without evidence of bleeding Frequent neuro checks Telemetry  monitoring PT/OT/SLP Consult PM & Rehab Consult Case Management /MSW Ongoing aggressive stroke risk factor management Patient will be counseled to be compliant with her antithrombotic medications Patient will be counseled on Lifestyle modifications including, Diet, Exercise, and Stress Follow up with [facility] in 6 weeks  HX OF STROKES: Remote lacunar infarct in the left thalamus.  MEDICAL ISSUES:  CCM following, Appreciate Assistance High risk for intubation given acute IVH  Hypertensive crisis  - failed BP goals on labetalol and cardene, changed to cleviprex  - troponin neg  S/p TPA f/b TXA and cryo for acute IVH Acute allergic reaction to IV contrast  Acute encephalopathy- related to ativan and concern for progressing IVH, monitor development of hydrocephalus  empiric antibiotic coverage for meningitis, in progress  SEIZURES: EEG- No seizure activity noted No seizure activity reported overnight Maintain Seizure precautions  Anemia  Hyperkalemia  UTI: Possible Sepsis Cultures pending Start ABX - in progress  HYPERTENSION: Stable BP goal: 120-140 SBP range Nicardipine drip, Labetolol PRN Long term BP goal normotensive. May slowly start B/P medications after 48 hours Home Meds: NONE  HYPERLIPIDEMIA:    Component Value Date/Time   CHOL 127 01/05/2018 1910   TRIG 91 01/05/2018 1910   HDL 52 01/05/2018 1910   CHOLHDL 2.4 01/05/2018 1910   VLDL 18 01/05/2018 1910   LDLCALC 57 01/05/2018 1910   Home Meds:  Lipitor 40 LDL  goal < 70 Will continue on Lipitor to 80 mg daily, once medically  stable Continue statin at discharge  DIABETES: Lab Results  Component Value Date   HGBA1C 5.5 01/05/2018  HgbA1c goal < 7.0 Currently on: Novolog Continue CBG monitoring and SSI to maintain glucose 140-180 mg/dl DM education   TOBACCO ABUSE & POLYSUBSTANCE ABUSE UDS+ Current smoker Smoking cessation counseling provided Nicotine patch  provided  OBESITY Obesity, Body mass index is 33.27 kg/m. Greater than/equal to 30  Other Active Problems: Active Problems:   Type 2 diabetes mellitus with neurologic complication (HCC)   Stroke (cerebrum) (HCC)   Intraventricular hemorrhage ([place])   Hypertensive crisis    Hospital day # 2 VTE prophylaxis: SCD's  Diet : DIET - DYS 1 Room service appropriate? Yes; Fluid consistency: Thin   FAMILY UPDATES: No family at bedside  TEAM UPDATES: Garvin Fila, MD   Prior Home Stroke Medications:  clopidogrel 75 mg daily  Discharge Stroke Meds:  Please discharge patient on TBD   Disposition: 01-Home or Self Care Therapy Recs:               PENDING Home Equipment:         PENDING Follow Up:  Follow-up Information    Garvin Fila, MD. Schedule an appointment as soon as possible for a visit in 6 week(s).   Specialties:  Neurology, Radiology Contact information: [address] [place] [address] [place], [place], [place] -PCP Follow up in 1-2 weeks   Case Management aware of need            I have personally examined this patient, reviewed notes, independently viewed imaging studies, participated in medical decision making and plan of care.ROS completed by me personally and pertinent positives fully documented  I have made any additions or clarifications directly to the above note.    She presented with altered mental status and focal left-sided weakness and neglect and was given IV TPA but developed post TPA intraventricular hemorrhage.   She has remained neurologically stable and shown improvement with strict blood pressure control and close neurological monitoring. Plan to transfer her to the neurology floor bed today. Mobilize out of bed. Physical occupational therapy consults.   This patient is critically ill and at significant risk of neurological worsening, death and care requires constant monitoring of vital signs,  hemodynamics,respiratory and cardiac monitoring, extensive review of multiple databases, frequent neurological assessment, discussion with family, other specialists and medical decision making of high complexity.I have made any additions or clarifications directly to the above note.This critical care time does not reflect procedure time, or teaching time or supervisory time of PA/NP/Med Resident etc but could involve care discussion time.  I spent 30 minutes of neurocritical care time  in the care of  this patient.     Antony Contras, MD Medical Director [facility] Pager: [phone] 01/06/2018 3:15 PM  To contact Stroke Continuity provider, please refer to [email or web address] After hours, contact General Neurology

## 2018-01-06 NOTE — Evaluation (Signed)
Occupational Therapy Evaluation Patient Details Name: Sheila [name] MRN: [id number] DOB: [birth year] Today's Date: 01/06/2018    History of Present Illness Pt admitted with AMS, CT scan initially negative, TPA given with hemorrhagic transformation. Second CT confirmed Bil IVH. Pt with h/o CVA and w/c bound at baseline, DM, arthritis.   Clinical Impression   Pt is dependent in all ADL with exception of drinking. She is w/c bound and likely dependent in transfers at the SNF. No acute OT needs.    Follow Up Recommendations  SNF;Supervision/Assistance - 24 hour    Equipment Recommendations       Recommendations for Other Services       Precautions / Restrictions Precautions Precautions: Fall Restrictions Weight Bearing Restrictions: No      Mobility Bed Mobility Overal bed mobility: Needs Assistance Bed Mobility: Supine to Sit     Supine to sit: Min guard     General bed mobility comments: increased time, HOB up  Transfers                 General transfer comment: attempted, pt unable to stand, reports knee pain with flexion    Balance Overall balance assessment: Needs assistance   Sitting balance-Leahy Scale: Fair       Standing balance-Leahy Scale: Zero                             ADL either performed or assessed with clinical judgement   ADL Overall ADL's : At baseline                                       General ADL Comments: pt requiring total assist, except was able to drink with set up     Vision   Additional Comments: appears grossly intact, not able to formally assess     Perception     Praxis      Pertinent Vitals/Pain Pain Assessment: Faces Faces Pain Scale: Hurts even more Pain Location: L LE Pain Descriptors / Indicators: Guarding;Grimacing;Moaning Pain Intervention(s): Monitored during session;Repositioned     Hand Dominance Right   Extremity/Trunk Assessment Upper Extremity  Assessment Upper Extremity Assessment: Generalized weakness   Lower Extremity Assessment Lower Extremity Assessment: Defer to PT evaluation       Communication Communication Communication: No difficulties(?receptive difficulties)   Cognition Arousal/Alertness: Awake/alert Behavior During Therapy: Flat affect Overall Cognitive Status: No family/caregiver present to determine baseline cognitive functioning                                     General Comments       Exercises     Shoulder Instructions      Home Living Family/patient expects to be discharged to:: Skilled nursing facility     Type of Home: [place]                           Additional Comments: pt lives at [facility].      Prior Functioning/Environment Level of Independence: Needs assistance  Gait / Transfers Assistance Needed: w/c bound, likely assisted for transfers ADL's / Homemaking Assistance Needed: dependent            OT Problem List: Impaired balance (sitting  and/or standing);Decreased activity tolerance;Decreased cognition;Decreased coordination;Obesity;Pain;Impaired UE functional use      OT Treatment/Interventions:      OT Goals(Current goals can be found in the care plan section)    OT Frequency:     Barriers to D/C:            Co-evaluation PT/OT/SLP Co-Evaluation/Treatment: Yes Reason for Co-Treatment: For patient/therapist safety;Necessary to address cognition/behavior during functional activity          AM-PAC PT "6 Clicks" Daily Activity     Outcome Measure Help from another person eating meals?: A Lot Help from another person taking care of personal grooming?: Total Help from another person toileting, which includes using toliet, bedpan, or urinal?: Total Help from another person bathing (including washing, rinsing, drying)?: Total Help from another person to put on and taking off regular upper body clothing?: Total Help  from another person to put on and taking off regular lower body clothing?: Total 6 Click Score: 7   End of Session Nurse Communication: Other (comment);Need for lift equipment(PLOF and L IV leaking)  Activity Tolerance: Patient tolerated treatment well Patient left: in bed;with call bell/phone within reach;with bed alarm set;with nursing/sitter in room  OT Visit Diagnosis: Pain;Muscle weakness (generalized) (M62.81);Cognitive communication deficit (R41.841)                Time: 1405-1440 OT Time Calculation (min): 35 min Charges:  OT General Charges $OT Visit: 1 Visit OT Evaluation $OT Eval Moderate Complexity: 1 Mod G-Codes:     Jan 18, 2018 Nestor Lewandowsky, OTR/L Pager: [phone]  Werner Lean, Haze Boyden 01/18/18, 3:00 PM

## 2018-01-06 NOTE — Evaluation (Signed)
Physical Therapy Evaluation and D/C Patient Details Name: Sheila [name] MRN: [id number] DOB: 1951-07-01 Today's Date: 01/06/2018   History of Present Illness  Pt admitted with AMS, CT scan initially negative, TPA given with hemorrhagic transformation. Second CT confirmed Bil IVH. Pt with h/o CVA and w/c bound at baseline, DM, arthritis.  Clinical Impression  Pt admitted with above diagnosis. Pt currently with functional limitations due to the deficits listed below (see PT Problem List). Pt was unable to participate in PT.  Pt does not follow commands and has poor movement patterns.  Per nurse, pt was bedand wheelchair bound PTA.  Will sign off as pt will not benefit from skilled PT.        Follow Up Recommendations SNF;Supervision/Assistance - 24 hour    Equipment Recommendations  None recommended by PT    Recommendations for Other Services       Precautions / Restrictions Precautions Precautions: Fall Restrictions Weight Bearing Restrictions: No      Mobility  Bed Mobility Overal bed mobility: Needs Assistance Bed Mobility: Supine to Sit     Supine to sit: Min assist;+2 for safety/equipment     General bed mobility comments: increased time, HOB up.  Needed a little assist at end of supine to sit to come to sitting. Needs close guard once up as she extends her LEs and leans posteriorly.   Transfers Overall transfer level: Needs assistance Equipment used: 2 person hand held assist Transfers: Sit to/from Stand           General transfer comment: attempted multiple times but pt unable to stand, reports knee pain with flexion.  Pt extends bil LEs and trunk making transfers unsafe. Was going to use lift to get pt up OOB but it was broken. Nursing secretary calling to get the lift repaired.   Ambulation/Gait                Stairs            Wheelchair Mobility    Modified Rankin (Stroke Patients Only)       Balance Overall balance assessment:  Needs assistance Sitting-balance support: Bilateral upper extremity supported;Feet supported Sitting balance-Leahy Scale: Poor Sitting balance - Comments: Needed bil UE support for balance in sitting and constant max to min assist.  Postural control: Posterior lean Standing balance support: Bilateral upper extremity supported;During functional activity Standing balance-Leahy Scale: Zero Standing balance comment: unable to stand                              Pertinent Vitals/Pain Pain Assessment: Faces Faces Pain Scale: Hurts even more Pain Location: L LE Pain Descriptors / Indicators: Guarding;Grimacing;Moaning Pain Intervention(s): Limited activity within patient's tolerance;Monitored during session;Repositioned    Home Living Family/patient expects to be discharged to:: Skilled nursing facility     Type of Home: [place]           Additional Comments: pt lives at [facility].    Prior Function Level of Independence: Needs assistance   Gait / Transfers Assistance Needed: w/c bound, likely assisted for transfers  ADL's / Homemaking Assistance Needed: dependent        Hand Dominance   Dominant Hand: Right    Extremity/Trunk Assessment   Upper Extremity Assessment Upper Extremity Assessment: Defer to OT evaluation    Lower Extremity Assessment Lower Extremity Assessment: RLE deficits/detail;LLE deficits/detail RLE Deficits / Details: grossly 2-/5 LLE Deficits /  Details: grossly 2-/5       Communication   Communication: No difficulties(?receptive difficulties)  Cognition Arousal/Alertness: Awake/alert Behavior During Therapy: Flat affect Overall Cognitive Status: No family/caregiver present to determine baseline cognitive functioning                                        General Comments      Exercises     Assessment/Plan    PT Assessment Patent does not need any further PT services  PT Problem List          PT Treatment Interventions      PT Goals (Current goals can be found in the Care Plan section)  Acute Rehab PT Goals Patient Stated Goal: unable to state PT Goal Formulation: All assessment and education complete, DC therapy    Frequency     Barriers to discharge        Co-evaluation   Reason for Co-Treatment: For patient/therapist safety;Necessary to address cognition/behavior during functional activity           AM-PAC PT "6 Clicks" Daily Activity  Outcome Measure Difficulty turning over in bed (including adjusting bedclothes, sheets and blankets)?: Unable Difficulty moving from lying on back to sitting on the side of the bed? : Unable Difficulty sitting down on and standing up from a chair with arms (e.g., wheelchair, bedside commode, etc,.)?: Unable Help needed moving to and from a bed to chair (including a wheelchair)?: Total Help needed walking in hospital room?: Total Help needed climbing 3-5 steps with a railing? : Total 6 Click Score: 6    End of Session Equipment Utilized During Treatment: Gait belt;Oxygen Activity Tolerance: Patient limited by fatigue;Patient limited by pain Patient left: in bed;with call bell/phone within reach;with bed alarm set;with nursing/sitter in room;with SCD's reapplied Nurse Communication: Mobility status;Need for lift equipment PT Visit Diagnosis: Muscle weakness (generalized) (M62.81);Pain Pain - part of body: Leg;Ankle and joints of foot(bilaterally)    Time: 8756-4332 PT Time Calculation (min) (ACUTE ONLY): 31 min   Charges:   PT Evaluation $PT Eval Moderate Complexity: 1 Mod     PT G Codes:        Sheila [name],PT Acute Rehabilitation [phone] (pager)   Denice Paradise 01/06/2018, 3:25 PM

## 2018-01-06 NOTE — Progress Notes (Signed)
  Speech Language Pathology Treatment: Dysphagia  Patient Details Name: Sheila [name] MRN: [id number] DOB: September 28, 1951 Today's Date: 01/06/2018 Time: 5027-7412 SLP Time Calculation (min) (ACUTE ONLY): 10 min  Assessment / Plan / Recommendation Clinical Impression  Pt seems much more alert today, although she still needs Mod cues for awareness and bolus acceptance. She has prolonged mastication and needs pureed boluses to clear residue from soft solids. One delayed throat clear was noted across all PO trials, otherwise without any signs of aspiration. Recommend Dys 1 diet and thin liquids. Will f/u for tolerance and further cognitive tx.   HPI HPI: 67 year old female resident of SNF with PMH which includes prior CVAs with residual deficits (wheelchair bound), presents with changes in mentation. CTY scan initially negative, TPA given with hemorrhagic transformation, repeat CT scan confirms acute bilateral IVH.       SLP Plan  Goals updated       Recommendations  Diet recommendations: Dysphagia 1 (puree);Thin liquid Liquids provided via: Cup;Straw Medication Administration: Crushed with puree Supervision: Staff to assist with self feeding;Full supervision/cueing for compensatory strategies Compensations: Slow rate;Small sips/bites Postural Changes and/or Swallow Maneuvers: Seated upright 90 degrees                Oral Care Recommendations: Oral care BID Follow up Recommendations: Skilled Nursing facility SLP Visit Diagnosis: Dysphagia, oral phase (R13.11) Plan: Goals updated       GO                Germain Osgood 01/06/2018, 11:58 AM  Germain Osgood, M.A. CCC-SLP [phone]

## 2018-01-07 DIAGNOSIS — Z794 Long term (current) use of insulin: Secondary | ICD-10-CM

## 2018-01-07 DIAGNOSIS — E1149 Type 2 diabetes mellitus with other diabetic neurological complication: Secondary | ICD-10-CM

## 2018-01-07 DIAGNOSIS — I739 Peripheral vascular disease, unspecified: Secondary | ICD-10-CM

## 2018-01-07 LAB — BASIC METABOLIC PANEL
Anion gap: 12 (ref 5–15)
BUN: 6 mg/dL (ref 6–20)
CALCIUM: 8.5 mg/dL — AB (ref 8.9–10.3)
CO2: 22 mmol/L (ref 22–32)
CREATININE: 0.91 mg/dL (ref 0.44–1.00)
Chloride: 105 mmol/L (ref 101–111)
GFR calc Af Amer: >60 mL/min (ref >60)
GLUCOSE: 143 mg/dL — AB (ref 65–99)
Potassium: 3 mmol/L — ABNORMAL LOW (ref 3.5–5.1)
Sodium: 139 mmol/L (ref 135–145)

## 2018-01-07 LAB — CBC
HCT: 31.5 % — ABNORMAL LOW (ref 36.0–46.0)
Hemoglobin: 10.9 g/dL — ABNORMAL LOW (ref 12.0–15.0)
MCH: 29.6 pg (ref 26.0–34.0)
MCHC: 34.6 g/dL (ref 30.0–36.0)
MCV: 85.6 fL (ref 78.0–100.0)
PLATELETS: 233 10*3/uL (ref 150–400)
RBC: 3.68 MIL/uL — ABNORMAL LOW (ref 3.87–5.11)
RDW: 13.2 % (ref 11.5–15.5)
WBC: 7.1 10*3/uL (ref 4.0–10.5)

## 2018-01-07 LAB — GLUCOSE, CAPILLARY
Glucose-Capillary: 112 mg/dL — ABNORMAL HIGH (ref 65–99)
Glucose-Capillary: 123 mg/dL — ABNORMAL HIGH (ref 65–99)
Glucose-Capillary: 140 mg/dL — ABNORMAL HIGH (ref 65–99)
Glucose-Capillary: 142 mg/dL — ABNORMAL HIGH (ref 65–99)

## 2018-01-07 MED ORDER — POTASSIUM CHLORIDE 20 MEQ/15ML (10%) PO SOLN
40.0000 meq | Freq: Two times a day (BID) | ORAL | Status: DC
  Administered 2018-01-07 – 2018-01-10 (×4): 40 meq via ORAL
  Filled 2018-01-07 (×6): qty 30

## 2018-01-07 MED ORDER — POTASSIUM CHLORIDE CRYS ER 20 MEQ PO TBCR
40.0000 meq | EXTENDED_RELEASE_TABLET | Freq: Two times a day (BID) | ORAL | Status: DC
  Administered 2018-01-07: 40 meq via ORAL
  Filled 2018-01-07: qty 2

## 2018-01-07 NOTE — Progress Notes (Signed)
01/07/2018  Rn was unable to educate concerning stroke, because of confusion and family not at bedside. Rico Sheehan RN

## 2018-01-07 NOTE — Progress Notes (Addendum)
NEUROHOSPITALISTS STROKE TEAM - DAILY PROGRESS NOTE   SUBJECTIVE (INTERVAL HISTORY) No family members present.  The patient became somewhat anxious when asked orientation questions which she could not answer. She did tell me that she had a good breakfast and she is without complaints at this time.  She followed some simple commands.     OBJECTIVE Lab Results: CBC:  Recent Labs  Lab 01/04/18 1356 01/04/18 1407 01/05/18 0435  WBC 9.6  --  9.7  HGB 12.5 12.6 10.8*  HCT 36.9 37.0 31.9*  MCV 86.8  --  85.3  PLT 259  --  296   BMP: Recent Labs  Lab 01/04/18 1356 01/04/18 1407 01/05/18 1910 01/06/18 1547  NA 141 141 141 141  K 5.3* 5.2* 4.9 2.9*  CL 108 106 107 107  CO2 25  --  21* 25  GLUCOSE 100* 99 147* 217*  BUN 9 13 13 8   CREATININE 0.98 0.90 1.11* 1.03*  CALCIUM 9.5  --  8.8* 8.6*   Liver Function Tests:  Recent Labs  Lab 01/04/18 1356 01/05/18 1910  AST 42* 47*  ALT 21 25  ALKPHOS 117 84  BILITOT 1.4* 0.9  PROT 6.9 6.1*  ALBUMIN 3.8 3.4*   Coagulation Studies:  Recent Labs    01/04/18 1356  APTT 28  INR 0.97    Microbiology: Blood Cx - NGTF Urine Cx - Pending Urinalysis:  Recent Labs  Lab 01/05/18 0926  COLORURINE YELLOW  APPEARANCEUR CLEAR  LABSPEC 1.013  PHURINE 6.0  GLUCOSEU 150*  HGBUR SMALL*  BILIRUBINUR NEGATIVE  KETONESUR NEGATIVE  PROTEINUR NEGATIVE  NITRITE POSITIVE*  LEUKOCYTESUR SMALL*   Urine Drug Screen:     Component Value Date/Time   LABOPIA NONE DETECTED 01/05/2018 0926   COCAINSCRNUR NONE DETECTED 01/05/2018 0926   LABBENZ POSITIVE (A) 01/05/2018 0926   AMPHETMU NONE DETECTED 01/05/2018 0926   THCU NONE DETECTED 01/05/2018 0926   LABBARB NONE DETECTED 01/05/2018 0926    Alcohol Level:  Recent Labs  Lab 01/04/18 1356  ETH <10   PHYSICAL EXAM Temp:  [97.7 F (36.5 C)-98.3 F (36.8 C)] 97.9 F (36.6 C) (01/12 0747) Pulse Rate:  [77-100] 82 (01/12  0332) Resp:  [16-22] 17 (01/12 0332) BP: (126-157)/(60-113) 136/113 (01/12 0747) SpO2:  [95 %-100 %] 100 % (01/12 0332) Weight:  [188 lb 11.4 oz (85.6 kg)] 188 lb 11.4 oz (85.6 kg) (01/12 0500)   Vitals:   01/07/18 0324 01/07/18 0332 01/07/18 0500 01/07/18 0747  BP:  127/60  (!) 136/113  Pulse:  82    Resp:  17    Temp: 98.3 F (36.8 C) 97.8 F (36.6 C)  97.9 F (36.6 C)  TempSrc: Oral Axillary  Oral  SpO2:  100%    Weight:   188 lb 11.4 oz (85.6 kg)   Height:        General - Well nourished, well developed, in no apparent distress HEENT-  Normocephalic, Normal external eye/conjunctiva.  Normal external ears. Normal external nose, mucus membranes and septum.   Cardiovascular - Regular rate and rhythm  Respiratory - Lungs clear bilaterally. No wheezing. Abdomen - soft and non-tender, BS normal Extremities- no edema or cyanosis Neuro Exam : Mental Status: Patient is awake alert and interactive. She tells me her name and follows commands,  diminished attention and registration Cranial Nerves: II: Does not blink to threat from the left. Does from the right. PERRL  III,IV, VI: R gaze deviation.  V: VII: blinks to eyelid  stimulaiton bilaterally.  XII: tongue is midline without atrophy or fasciculations.  Motor: She moves all extremities with good strength, no focal weakness  Sensory: No deficits  Cerebellar: Does not perform, but no clear ataxia.   IMAGING: I have personally reviewed the radiological images below and agree with the radiology interpretations.  Ct Head Wo Contrast Result Date: 01/05/2018 IMPRESSION: Unchanged appearance of intraventricular blood within the occipital horns of both lateral ventricles. No new site of hemorrhage or new ventricular dilatation. Electronically Signed   By: Ulyses Jarred M.D.   On: 01/05/2018 05:15   Ct Head Wo Contrast Result Date: 01/04/2018 IMPRESSION: 1. Suspected redistribution of intraventricular blood, now within both occipital  horns of the lateral ventricles. 2. Unchanged size and configuration of the ventricles. 3. No new site of hemorrhage. Electronically Signed   By: Ulyses Jarred M.D.   On: 01/04/2018 22:13   Ct Head Wo Contrast Result Date: 01/04/2018 IMPRESSION: 1. New intraventricular hemorrhage layering in the occipital horn on the left. No hydrocephalus. 2. No visible acute gray matter infarct. 3. Severe white matter disease with volume loss. Remote lacunar infarct in the left thalamus. Electronically Signed   By: Monte Fantasia M.D.   On: 01/04/2018 16:21   Ct Angio Head Lavetta Nielsen W Or Wo Contrast Result Date: 01/04/2018 IMPRESSION: No large or medium vessel occlusion. No significant carotid bifurcation disease. No stenosis on the left. Focal calcified plaque at the distal bulb on the right but without stenosis when compared to the more distal cervical ICA. 50% stenosis the left subclavian artery just beyond the origin. Peripheral calcification throughout both carotid siphon regions. Generalized narrowing throughout the siphon regions, with stenosis estimated in the 30-50% range. These results were communicated to Dr. Leonel Ramsay At 2:43 pmon 1/9/2019by text page via the [facility] messaging system. Electronically Signed   By: Nelson Chimes M.D.   On: 01/04/2018 14:45   Mr Brain Wo Contrast Result Date: 01/05/2018 IMPRESSION: Motion degradation on multiple sequences. 1. Stable intraventricular hemorrhage pooling in left greater than right occipital horns. Stable ventricle size. 2. No stroke, new acute hemorrhage, or significant mass effect identified. 3. Advanced chronic microvascular ischemic changes and parenchymal volume loss of the brain. Electronically Signed   By: Kristine Garbe M.D.   On: 01/05/2018 17:34   Ct Head Code Stroke Wo Contrast Result Date: 01/04/2018 IMPRESSION: 1. No acute finding. Advanced chronic small-vessel ischemic changes throughout the hemispheric white matter. Old left thalamic infarction.  2. ASPECTS is 10. 3. These results were communicated to Dr. Leonel Ramsay At 2:12 pmon 1/9/2019by text page via the [facility] messaging system. Electronically Signed   By: Nelson Chimes M.D.   On: 01/04/2018 14:14   Echocardiogram:                                               Study Conclusions - Left ventricle: The cavity size was normal. There was mild   concentric hypertrophy. Systolic function was vigorous. The   estimated ejection fraction was in the range of 65% to 70%. Wall   motion was normal; there were no regional wall motion   abnormalities. Doppler parameters are consistent with abnormal   left ventricular relaxation (grade 1 diastolic dysfunction). - Mitral valve: Mildly calcified annulus. Valve area by continuity   equation (using LVOT flow): 1.79 cm^2. - Left atrium: The atrium was mildly  dilated. - Pulmonary arteries: Systolic pressure was mildly increased. PA   peak pressure: 34 mm Hg (S).  Impressions:  No cardiac source of embolism was identified, but cannot be ruled   out on the basis of this examination.      EEG:                                                               EEG Abnormalities: 1) possible asymmetric PDR, though I do not think this is definite by the study. 2) generalized irregular slow activity Clinical Interpretation: This is a limited EEG is consistent with a generalized nonspecific cerebral dysfunction, with possible superimposed right hemispheric dysfunction.   There was no seizure or seizure predisposition recorded on this study     IMPRESSION: Ms. Sheila [name] is a 67 y.o. female with PMH of  2 previous strokes the most recent of which was a left thalamic infarct in February 2017 with a residual right foot drop, hyperlipidemia, diabetes mellitus, peripheral arterial disease, and arthritis brought to [facility] today from her nursing facility as a code stroke with reports of acute onset Left sided weakness.  IV TPA was given at  1424 on 01/04/2018.  Per the nursing tech who witnessed the event.  The patient became suddenly agitated, put her right arm to her head and pushed back from the table that she was sitting at.  At that point, the tech realized she was only pushing with her right leg and therefore was "spinning in circles" as she was trying to push away.  She was not moving the left arm or leg therefore EMS was called.  En route, the left arm and leg weakness improved, however she was still suffering from a left facial droop, severe left hemineglect, left hemianopia, as well as severe confusion.  Given this picture, she was given IV TPA. Family was not present so was administered with emergency consent after consultation with ER physician. Following IV tPA, the patient remained quite agitated, and this plus her paucity of speech prompted a stat EEG.   BP spiked following severe agitation, ativan given to confirm BP, following this it remained elevated and CT was obtained showing IVH. Giving cryoprecipitate and tranexamic acid  Patient with high fever, mental status changes on arrival. Meningitis may need to be considered as well. hence ordered empiric antibiotics.  Patient had repeat CTH. Increased IVH with no hydro. SBP 120-130s on Cleviprex. Exam essentially unchanged from prior exam with exception of decrease in level of arousal, but is able to open eyes to voice. Not following commands. Not agitated. D/W NSGY on call over phone - no neurosurgical intervention indicated at this time. Repeat CTH at 0400 hrs stable ICH  New intraventricular hemorrhage layering in the occipital horn on the left.  No stroke, new acute hemorrhage post TPA reversed with cryo and TXA no significant mass effect identified.  Suspected Etiology: Hemorrhage s/p IV tPA for stroke mimic Resultant Symptoms:  left facial droop, severe left hemineglect, left hemianopia, severe confusion. Now improved Stroke Risk Factors: diabetes mellitus,  hyperlipidemia and hypertension Other Stroke Risk Factors: Advanced age, Obesity, Body mass index is 33.43 kg/m.  Hx stroke, PAD  PLAN  01/07/2018: Continue Statin HOLD ASA until 24 hour post tPA neuroimaging is stable & without evidence of bleeding Frequent neuro checks Telemetry monitoring PT/OT/SLP Consult PM & Rehab Consult Case Management /MSW Ongoing aggressive stroke risk factor management Patient will be counseled to be compliant with her antithrombotic medications Patient will be counseled on Lifestyle modifications including, Diet, Exercise, and Stress Follow up with [facility] in 6 weeks  HX OF STROKES: Remote lacunar infarct in the left thalamus.  MEDICAL ISSUES:  CCM following, Appreciate Assistance High risk for intubation given acute IVH  Hypertensive crisis  - failed BP goals on labetalol and cardene, changed to cleviprex  - troponin neg  S/p TPA f/b TXA and cryo for acute IVH Acute allergic reaction to IV contrast  Acute encephalopathy- related to ativan and concern for progressing IVH, monitor development of hydrocephalus  empiric antibiotic coverage for meningitis, in progress  SEIZURES: EEG- No seizure activity noted No seizure activity reported overnight Maintain Seizure precautions  Hypokalemia   2.9 - supplement  UTI: Possible Sepsis Blood and urine cultures no growth - afebrile - WBC 9.7 Discontinue antibiotics.  HYPERTENSION: BP controlled on Vasotec. Stable BP goal: 120-140 SBP range Nicardipine drip, Labetolol PRN Long term BP goal normotensive. May slowly start B/P medications after 48 hours Home Meds: NONE  HYPERLIPIDEMIA:    Component Value Date/Time   CHOL 127 01/05/2018 1910   TRIG 91 01/05/2018 1910   HDL 52 01/05/2018 1910   CHOLHDL 2.4 01/05/2018 1910   VLDL 18 01/05/2018 1910   LDLCALC 57 01/05/2018 1910   Home Meds:   Lipitor 40 LDL  goal < 70 Will continue on Lipitor to 80 mg daily, once medically stable Continue statin at discharge  DIABETES: Lab Results  Component Value Date   HGBA1C 5.5 01/05/2018  HgbA1c goal < 7.0 Currently on: Novolog Continue CBG monitoring and SSI to maintain glucose 140-180 mg/dl DM education   TOBACCO ABUSE & POLYSUBSTANCE ABUSE UDS+ Current smoker Smoking cessation counseling provided Nicotine patch provided  OBESITY Obesity, Body mass index is 33.43 kg/m. Greater than/equal to 30  Other Active Problems: Active Problems:   Type 2 diabetes mellitus with neurologic complication (HCC)   Stroke (cerebrum) (HCC)   Intraventricular hemorrhage ([place])   Hypertensive crisis    Hospital day # 3 VTE prophylaxis: SCD's  Diet : DIET - DYS 1 Room service appropriate? Yes; Fluid consistency: Thin   FAMILY UPDATES: No family at bedside  TEAM UPDATES: Rosalin Hawking, MD   Prior Home Stroke Medications:  clopidogrel 75 mg daily  Discharge Stroke Meds:  Please discharge patient on TBD   Disposition: 01-Home or Self Care Therapy Recs:     SNF Home Equipment:         PENDING Follow Up:  Follow-up Information    Garvin Fila, MD. Schedule an appointment as soon as possible for a visit in 6 week(s).   Specialties:  Neurology, Radiology Contact information: [address] [place] [address] [place], [place] -PCP Follow up in 1-2 weeks   Case Management aware of need          Lowry Ram [facility] Pager [phone] 01/07/2018, 2:29 PM   ATTENDING NOTE: I reviewed above note and agree with the assessment and plan. I have made any additions or clarifications directly to the above note. Pt was seen and examined.  67 year old female with history of stroke, obesity, smoker, hypertension, hyperlipidemia, diabetes, PAD, SNF resident admitted for strokelike symptoms.  Status post TPA.  Repeat CT  showed bilateral IVH.  MRI showed no acute stroke but bilateral IVH stable.  EF 65-70%, LDL 57 and A1c 5.5.  EEG no seizure.  IVH likely related to TPA use.  Currently blood pressure under control.  Culture-negative, DC antibiotics.  Potassium was low, given supplement.  Patient still has partial global aphasia, not following commands.  Continue IV fluid, will repeat CT head in a.m.  Rosalin Hawking, MD PhD Stroke Neurology 01/07/2018 11:50 PM  This patient is critically ill due to stroke like symptoms s/p tPA, IVH bilaterally, HTN and at significant risk of neurological worsening, death form recurrent stroke, hematoma expansion, hydrocephalus, status epilepticus. This patient's care requires constant monitoring of vital signs, hemodynamics, respiratory and cardiac monitoring, review of multiple databases, neurological assessment, discussion with family, other specialists and medical decision making of high complexity. I spent 35 minutes of neurocritical care time in the care of this patient.     To contact Stroke Continuity provider, please refer to [email or web address] After hours, contact General Neurology

## 2018-01-08 ENCOUNTER — Inpatient Hospital Stay (HOSPITAL_COMMUNITY): Payer: Medicare Other

## 2018-01-08 LAB — CBC
HCT: 32.2 % — ABNORMAL LOW (ref 36.0–46.0)
HEMOGLOBIN: 11.2 g/dL — AB (ref 12.0–15.0)
MCH: 29.8 pg (ref 26.0–34.0)
MCHC: 34.8 g/dL (ref 30.0–36.0)
MCV: 85.6 fL (ref 78.0–100.0)
Platelets: 261 10*3/uL (ref 150–400)
RBC: 3.76 MIL/uL — AB (ref 3.87–5.11)
RDW: 13.3 % (ref 11.5–15.5)
WBC: 6.7 10*3/uL (ref 4.0–10.5)

## 2018-01-08 LAB — GLUCOSE, CAPILLARY
GLUCOSE-CAPILLARY: 111 mg/dL — AB (ref 65–99)
GLUCOSE-CAPILLARY: 164 mg/dL — AB (ref 65–99)
Glucose-Capillary: 107 mg/dL — ABNORMAL HIGH (ref 65–99)
Glucose-Capillary: 110 mg/dL — ABNORMAL HIGH (ref 65–99)
Glucose-Capillary: 136 mg/dL — ABNORMAL HIGH (ref 65–99)
Glucose-Capillary: 159 mg/dL — ABNORMAL HIGH (ref 65–99)

## 2018-01-08 LAB — BASIC METABOLIC PANEL
Anion gap: 11 (ref 5–15)
CHLORIDE: 108 mmol/L (ref 101–111)
CO2: 22 mmol/L (ref 22–32)
CREATININE: 0.89 mg/dL (ref 0.44–1.00)
Calcium: 8.9 mg/dL (ref 8.9–10.3)
GFR calc Af Amer: >60 mL/min (ref >60)
GFR calc non Af Amer: >60 mL/min (ref >60)
GLUCOSE: 113 mg/dL — AB (ref 65–99)
POTASSIUM: 3.2 mmol/L — AB (ref 3.5–5.1)
SODIUM: 141 mmol/L (ref 135–145)

## 2018-01-08 NOTE — Progress Notes (Signed)
Pt's Son and POA, Elta Guadeloupe fFields came in this evening to be updated on mothers condition  Text page  Neuro  On- cal who is busy at the moment and can't alk,  Told ,son to go and come in  tomorow  am at around 0830 so that he can be updated by the stroke team during their.rounds. Michela Pitcher ok but left his phone #  [phone] to call him if unable to get here on time.

## 2018-01-08 NOTE — Progress Notes (Signed)
NEUROHOSPITALISTS STROKE TEAM - DAILY PROGRESS NOTE   SUBJECTIVE (INTERVAL HISTORY) No family members present.  Minimal spontaneous speech. Able to repeat some phrases after examiner. She is easily frustrated. Unable to follow most simple commands.  Nurse reports patient is not eating well. Swallowing concerns - will have speech re evaluate. Check calorie count.     OBJECTIVE Lab Results: CBC:  Recent Labs  Lab 01/05/18 0435 01/07/18 0707 01/08/18 0735  WBC 9.7 7.1 6.7  HGB 10.8* 10.9* 11.2*  HCT 31.9* 31.5* 32.2*  MCV 85.3 85.6 85.6  PLT 296 233 261   BMP: Recent Labs  Lab 01/04/18 1356 01/04/18 1407 01/05/18 1910 01/06/18 1547 01/07/18 0707 01/08/18 0735  NA 141 141 141 141 139 141  K 5.3* 5.2* 4.9 2.9* 3.0* 3.2*  CL 108 106 107 107 105 108  CO2 25  --  21* 25 22 22   GLUCOSE 100* 99 147* 217* 143* 113*  BUN 9 13 13 8 6  <5*  CREATININE 0.98 0.90 1.11* 1.03* 0.91 0.89  CALCIUM 9.5  --  8.8* 8.6* 8.5* 8.9   Liver Function Tests:  Recent Labs  Lab 01/04/18 1356 01/05/18 1910  AST 42* 47*  ALT 21 25  ALKPHOS 117 84  BILITOT 1.4* 0.9  PROT 6.9 6.1*  ALBUMIN 3.8 3.4*   Coagulation Studies:  No results for input(s): APTT, INR in the last 72 hours.  Microbiology: Blood Cx - NGTF Urine Cx - Pending Urinalysis:  Recent Labs  Lab 01/05/18 0926  COLORURINE YELLOW  APPEARANCEUR CLEAR  LABSPEC 1.013  PHURINE 6.0  GLUCOSEU 150*  HGBUR SMALL*  BILIRUBINUR NEGATIVE  KETONESUR NEGATIVE  PROTEINUR NEGATIVE  NITRITE POSITIVE*  LEUKOCYTESUR SMALL*   Urine Drug Screen:     Component Value Date/Time   LABOPIA NONE DETECTED 01/05/2018 0926   COCAINSCRNUR NONE DETECTED 01/05/2018 0926   LABBENZ POSITIVE (A) 01/05/2018 0926   AMPHETMU NONE DETECTED 01/05/2018 0926   THCU NONE DETECTED 01/05/2018 0926   LABBARB NONE DETECTED 01/05/2018 0926    Alcohol Level:  Recent Labs  Lab 01/04/18 1356  ETH <10    PHYSICAL EXAM Temp:  [97.8 F (36.6 C)-100.1 F (37.8 C)] 98.8 F (37.1 C) (01/13 0700) Pulse Rate:  [77] 77 (01/13 0410) Resp:  [22] 22 (01/13 0410) BP: (106-154)/(64-105) 154/64 (01/13 0700) SpO2:  [100 %] 100 % (01/13 0410)   Vitals:   01/07/18 2313 01/08/18 0302 01/08/18 0410 01/08/18 0700  BP:   106/76 (!) 154/64  Pulse:   77   Resp:   (!) 22   Temp: 98.8 F (37.1 C) 98.9 F (37.2 C)  98.8 F (37.1 C)  TempSrc: Oral Oral  Oral  SpO2:   100%   Weight:      Height:        General - Well nourished, well developed, in no apparent distress HEENT-  Normocephalic, Normal external eye/conjunctiva.  Normal external ears. Normal external nose, mucus membranes and septum.   Cardiovascular - Regular rate and rhythm  Respiratory - Lungs clear bilaterally. No wheezing. Abdomen - soft and non-tender, BS normal Extremities- no edema or cyanosis Neuro Exam : Mental Status: Patient is awake alert, appears to have partial global aphasia, not able to follow simple commands or answer questions, seems to have word finding difficulty, however, has intermittent spontaneous speech. Able to repeat but not naming.  Cranial Nerves: II: blinking to visual threat bilaterally. PERRL  III,IV, VI: no gaze preference  V: VII: blinks to  eyelid stimulaiton bilaterally.  XII: tongue is midline without atrophy or fasciculations.  Motor: She moves all extremities with good strength, no focal weakness  Sensory: No deficits  Cerebellar: Does not perform, but no apparent ataxia.   IMAGING: I have personally reviewed the radiological images below and agree with the radiology interpretations.  CT Head Wo Contrast  01/08/2017 IMPRESSION: 1. Degenerating intraventricular hemorrhage without hydrocephalus.   Small volume new, possibly redistributed subarachnoid blood products. 2. Stable severe chronic small vessel ischemic disease and old LEFT thalamus lacunar infarct. 3. These results will be called  to the ordering clinician or representative by the Radiologist Assistant, and communication documented in the PACS or zVision Dashboard.  Ct Head Wo Contrast Result Date: 01/05/2018 IMPRESSION: Unchanged appearance of intraventricular blood within the occipital horns of both lateral ventricles. No new site of hemorrhage or new ventricular dilatation. Electronically Signed   By: Ulyses Jarred M.D.   On: 01/05/2018 05:15   Ct Head Wo Contrast Result Date: 01/04/2018 IMPRESSION: 1. Suspected redistribution of intraventricular blood, now within both occipital horns of the lateral ventricles. 2. Unchanged size and configuration of the ventricles. 3. No new site of hemorrhage. Electronically Signed   By: Ulyses Jarred M.D.   On: 01/04/2018 22:13   Ct Head Wo Contrast Result Date: 01/04/2018 IMPRESSION: 1. New intraventricular hemorrhage layering in the occipital horn on the left. No hydrocephalus. 2. No visible acute gray matter infarct. 3. Severe white matter disease with volume loss. Remote lacunar infarct in the left thalamus. Electronically Signed   By: Monte Fantasia M.D.   On: 01/04/2018 16:21   Ct Angio Head Lavetta Nielsen W Or Wo Contrast Result Date: 01/04/2018 IMPRESSION: No large or medium vessel occlusion. No significant carotid bifurcation disease. No stenosis on the left. Focal calcified plaque at the distal bulb on the right but without stenosis when compared to the more distal cervical ICA. 50% stenosis the left subclavian artery just beyond the origin. Peripheral calcification throughout both carotid siphon regions. Generalized narrowing throughout the siphon regions, with stenosis estimated in the 30-50% range. These results were communicated to Dr. Leonel Ramsay At 2:43 pmon 1/9/2019by text page via the [facility] messaging system. Electronically Signed   By: Nelson Chimes M.D.   On: 01/04/2018 14:45   Mr Brain Wo Contrast Result Date: 01/05/2018 IMPRESSION: Motion degradation on multiple sequences. 1.  Stable intraventricular hemorrhage pooling in left greater than right occipital horns. Stable ventricle size. 2. No stroke, new acute hemorrhage, or significant mass effect identified. 3. Advanced chronic microvascular ischemic changes and parenchymal volume loss of the brain. Electronically Signed   By: Kristine Garbe M.D.   On: 01/05/2018 17:34   Ct Head Code Stroke Wo Contrast Result Date: 01/04/2018 IMPRESSION: 1. No acute finding. Advanced chronic small-vessel ischemic changes throughout the hemispheric white matter. Old left thalamic infarction. 2. ASPECTS is 10. 3. These results were communicated to Dr. Leonel Ramsay At 2:12 pmon 1/9/2019by text page via the [facility] messaging system. Electronically Signed   By: Nelson Chimes M.D.   On: 01/04/2018 14:14   Echocardiogram:                                               Study Conclusions - Left ventricle: The cavity size was normal. There was mild   concentric hypertrophy. Systolic function was vigorous. The   estimated ejection  fraction was in the range of 65% to 70%. Wall   motion was normal; there were no regional wall motion   abnormalities. Doppler parameters are consistent with abnormal   left ventricular relaxation (grade 1 diastolic dysfunction). - Mitral valve: Mildly calcified annulus. Valve area by continuity   equation (using LVOT flow): 1.79 cm^2. - Left atrium: The atrium was mildly dilated. - Pulmonary arteries: Systolic pressure was mildly increased. PA   peak pressure: 34 mm Hg (S).  Impressions:  No cardiac source of embolism was identified, but cannot be ruled   out on the basis of this examination.    EEG 01/04/18:                                                               EEG Abnormalities: 1) possible asymmetric PDR, though I do not think this is definite by the study. 2) generalized irregular slow activity Clinical Interpretation: This is a limited EEG is consistent with a generalized nonspecific cerebral  dysfunction, with possible superimposed right hemispheric dysfunction.   There was no seizure or seizure predisposition recorded on this study  Ct Head Wo Contrast  Result Date: 01/08/2018 CLINICAL DATA:  Follow-up intracranial hemorrhage. EXAM: CT HEAD WITHOUT CONTRAST TECHNIQUE: Contiguous axial images were obtained from the base of the skull through the vertex without intravenous contrast. COMPARISON:  CT HEAD January 05, 2018 FINDINGS: BRAIN: Degenerating intraventricular blood products dependent in the occipital horns. Small volume dense supra and infratentorial subarachnoid blood. Moderate global parenchymal brain volume loss without hydrocephalus. Patchy to confluent supratentorial white matter hypodensities. No midline shift, mass effect or acute large vascular territory infarcts. Old LEFT thalamus lacunar infarct. Basal cisterns are patent. VASCULAR: Moderate calcific atherosclerosis of the carotid siphons. SKULL: No skull fracture. No significant scalp soft tissue swelling. SINUSES/ORBITS: The mastoid air-cells and included paranasal sinuses are well-aerated.The included ocular globes and orbital contents are non-suspicious. OTHER: None. IMPRESSION: 1. Degenerating intraventricular hemorrhage without hydrocephalus. Small volume new, possibly redistributed subarachnoid blood products. 2. Stable severe chronic small vessel ischemic disease and old LEFT thalamus lacunar infarct. 3. These results will be called to the ordering clinician or representative by the Radiologist Assistant, and communication documented in the PACS or zVision Dashboard. Electronically Signed   By: Elon Alas M.D.   On: 01/08/2018 05:06       IMPRESSION: Sheila [name] is a 67 y.o. female with PMH of  2 previous strokes the most recent of which was a left thalamic infarct in February 2017 with a residual right foot drop, hyperlipidemia, diabetes mellitus, peripheral arterial disease, and arthritis brought [facility]  [facility] today from her nursing facility as a code stroke with reports of acute onset Left sided weakness. IV TPA was given at 1424 on 01/04/2018.  Per the nursing tech who witnessed the event.  The patient became suddenly agitated, put her right arm to her head and pushed back from the table that she was sitting at.  At that point, the tech realized she was only pushing with her right leg and therefore was "spinning in circles" as she was trying to push away.  She was not moving the left arm or leg therefore EMS was called.  En route, the left arm and leg weakness  improved, however she was still suffering from a left facial droop, severe left hemineglect, left hemianopia, as well as severe confusion.  Given this picture, she was given IV TPA. Family was not present so was administered with emergency consent after consultation with ER physician. Following IV tPA, the patient remained quite agitated, and this plus her paucity of speech prompted a stat EEG.   BP spiked following severe agitation, ativan given to confirm BP, following this it remained elevated and CT was obtained showing IVH. Giving cryoprecipitate and tranexamic acid  Patient with high fever, mental status changes on arrival. Meningitis may need to be considered as well. hence ordered empiric antibiotics.  Patient had repeat CTH. Increased IVH with no hydro. SBP 120-130s on Cleviprex. Exam essentially unchanged from prior exam with exception of decrease in level of arousal, but is able to open eyes to voice. Not following commands. Not agitated. D/W NSGY on call over phone - no neurosurgical intervention indicated at this time. Repeat CTH at 0400 hrs stable ICH  New intraventricular hemorrhage layering in the occipital horn on the left.  No stroke, new acute hemorrhage post TPA reversed with cryo and TXA no significant mass effect identified.  Suspected Etiology: Hemorrhage s/p IV tPA for stroke mimic Resultant Symptoms:   left facial droop, severe left hemineglect, left hemianopia, severe confusion. Now improved Stroke Risk Factors: diabetes mellitus, hyperlipidemia and hypertension Other Stroke Risk Factors: Advanced age, Obesity, Body mass index is 33.43 kg/m.  Hx stroke, PAD  Repeat Head CT Today Wo Contrast - see above.                                                                           PLAN  01/08/2018: Continue Statin HOLD ASA until 24 hour post tPA neuroimaging is stable & without evidence of bleeding Frequent neuro checks Telemetry monitoring PT/OT/SLP Consult PM & Rehab Consult Case Management /MSW Ongoing aggressive stroke risk factor management Patient will be counseled to be compliant with her antithrombotic medications Patient will be counseled on Lifestyle modifications including, Diet, Exercise, and Stress Follow up with [facility] in 6 weeks  HX OF STROKES: Remote lacunar infarct in the left thalamus.   MEDICAL ISSUES:   Hypertensive crisis   Failed BP goals on labetalol and cardene, changed to cleviprex   Troponin neg   S/p TPA f/b TXA and cryo for acute IVH  Acute allergic reaction to IV contrast   Acute Encephalopathy  Related to ativan and concern for progressing IVH   Monitor development of hydrocephalus   Empiric antibiotic coverage for meningitis, in progress   SEIZURES:  EEG- No seizure activity noted  Maintain Seizure precautions   Hypokalemia   2.9 - supplement -> 3.2 -> supplement further - Bmet in AM.   UTI:  Possible Sepsis  Blood and urine cultures no growth - afebrile - WBC 9.7->7.1->6.2  Discontinue antibiotics.   HYPERTENSION:   BP controlled on Vasotec  BP goal: 120-140 SBP range  Nicardipine drip D/C'd - Labetolol PRN - Hydralazine PRN  Long term BP goal normotensive.  Home Meds: NONE    PO Intake Nurse reports poor PO intake - possible swallowing issues - Currently Dysphagia 1 thin  liquids  Speech to re evaluate swallowing  48 hr calorie count     HYPERLIPIDEMIA:    Component Value Date/Time   CHOL 127 01/05/2018 1910   TRIG 91 01/05/2018 1910   HDL 52 01/05/2018 1910   CHOLHDL 2.4 01/05/2018 1910   VLDL 18 01/05/2018 1910   LDLCALC 57 01/05/2018 1910    Home Meds:  Lipitor 40  LDL  goal < 70  Will continue on Lipitor to 80 mg daily, once medically stable  Continue statin at discharge    DIABETES: Lab Results  Component Value Date   HGBA1C 5.5 01/05/2018   HgbA1c goal < 7.0  Currently on: Novolog  Continue CBG monitoring and SSI to maintain glucose 140-180 mg/dl  DM education    TOBACCO ABUSE & POLYSUBSTANCE ABUSE  UDS+ benzodiazapines  Current smoker  Smoking cessation counseling provided  Nicotine patch provided    OBESITY  Obesity, Body mass index is 33.43 kg/m. Greater than/equal to 30    Other Active Problems: Active Problems:   Type 2 diabetes mellitus with neurologic complication (HCC)   Stroke (cerebrum) (HCC)   Intraventricular hemorrhage ([place])   Hypertensive crisis    Hospital day # 4 VTE prophylaxis: SCD's  Diet : DIET - DYS 1 Room service appropriate? Yes; Fluid consistency: Thin   FAMILY UPDATES: No family at bedside  TEAM UPDATES: Rosalin Hawking, MD   Prior Home Stroke Medications:  clopidogrel 75 mg daily  Discharge Stroke Meds:  Please discharge patient on TBD   Disposition: 01-Home or Self Care Therapy Recs:     SNF Home Equipment:         PENDING Follow Up:  Follow-up Information    Garvin Fila, MD. Schedule an appointment as soon as possible for a visit in 6 week(s).   Specialties:  Neurology, Radiology Contact information: [address] [place] [address] [place], [place], [place] -PCP Follow up in 1-2 weeks   Case Management aware of need       Lowry Ram [facility] Pager [phone] 01/08/2018, 10:48  AM    ATTENDING NOTE: I reviewed above note and agree with the assessment and plan. I have made any additions or clarifications directly to the above note. Pt was seen and examined.   67 year old female with history of stroke, obesity, smoker, hypertension, hyperlipidemia, diabetes, PAD, SNF resident admitted for strokelike symptoms.  Status post TPA.  Repeat CT showed bilateral IVH.  MRI showed no acute stroke but bilateral IVH stable.  EF 65-70%, LDL 57 and A1c 5.5.  EEG no seizure.  IVH likely related to TPA use.  Currently blood pressure under control.  Patient still has partial global aphasia, not following commands. Repeat CT this am showed stable IVH. Language deficit etiology unclear, could be due to underlying dementia or seizure or DWI-negative stroke. Will repeat EEG and limited MRI. Passed swallow, encourage po intake. PT/OT recommend SNF.  Rosalin Hawking, MD PhD Stroke Neurology 01/08/2018 10:38 PM  To contact Stroke Continuity provider, please refer to [email or web address] After hours, contact General Neurology

## 2018-01-08 NOTE — Progress Notes (Signed)
  Speech Language Pathology Treatment: Dysphagia  Patient Details Name: Shana Zavaleta MRN: [id number] DOB: 01-05-1951 Today's Date: 01/08/2018 Time: 1720-1750 SLP Time Calculation (min) (ACUTE ONLY): 30 min  Assessment / Plan / Recommendation Clinical Impression  Pt seen for re-evaluation at request of MD. Cognitively-based oral dysphagia persists. SLP facilitated appropriate positioning and provided assistance for self-feeding of purees, thin liquids from dinner tray. Despite verbal, tactile cues pt continually attempts to drink solids from cup. Pt was able to self-feed when handed a spoon with puree. Oral phase prolonged, particularly with larger bites. One immediate throat clear after consecutive sips of thin liquids via straw; no overt signs of aspiration with smaller, single sips. Provided upgraded texture trial of soft solid, for which mastication was prolonged and oral residue remained on lingual surface. With bite of puree to clear soft solid, pt with immediate cough, concerning for decreased airway protection. At this time recommend she continue with current diet of dys 1, thin liquids, with full supervision and assistance for self-feeding as much as she is able. D/w nurse tech and RN.      HPI HPI: 67 year old female resident of SNF with PMH which includes prior CVAs with residual deficits (wheelchair bound), presents with changes in mentation. CTY scan initially negative, TPA given with hemorrhagic transformation, repeat CT scan confirms acute bilateral IVH.       SLP Plan  Continue with current plan of care       Recommendations  Diet recommendations: Dysphagia 1 (puree);Thin liquid Liquids provided via: Cup;Straw Medication Administration: Crushed with puree Supervision: Staff to assist with self feeding;Full supervision/cueing for compensatory strategies Compensations: Slow rate;Small sips/bites                Oral Care Recommendations: Oral care BID Follow up  Recommendations: Skilled Nursing facility SLP Visit Diagnosis: Dysphagia, oral phase (R13.11) Plan: Continue with current plan of care       [place], [place], [place] Speech-Language Pathologist [place] 01/08/2018, 6:03 PM

## 2018-01-08 NOTE — Clinical Social Work Note (Signed)
Clinical Social Work Assessment  Patient Details  Name: Sheila [name] MRN: [id number] Date of Birth: [birth year]  Date of referral:  01/08/18               Reason for consult:  Facility Placement, Discharge Planning                Permission sought to share information with:  Facility Sport and exercise psychologist, Family Supports Permission granted to share information::  Yes, Verbal Permission Granted  Name::     [employer]  Agency::  [name]  Relationship::  Son  Contact Information:  [phone]  Housing/Transportation Living arrangements for the past 2 months:  [place] of Information:  Medical Team, Adult Children Patient Interpreter Needed:  None Criminal Activity/Legal Involvement Pertinent to Current Situation/Hospitalization:  No - Comment as needed Significant Relationships:  Adult Children Lives with:  Facility Resident Do you feel safe going back to the place where you live?  Yes Need for family participation in patient care:  Yes (Comment)  Care giving concerns:  Patient is a resident at [facility]. PT recommending SNF once medically stable for discharge.   Social Worker assessment / plan:  Patient only oriented to self. No supports at bedside. CSW called patient's son, introduced role, and explained that PT recommendations would be discussed. Patient's son confirmed that patient is from [name]. Discussed SNF placement. He would like to discuss with his brother and sister and call CSW back in a few hours, if not, by the end of the day. No further concerns. CSW encouraged patient's son to contact CSW as needed. CSW will continue to follow patient and her son for support and facilitate discharge to SNF once medically stable.  Employment status:  Retired Forensic scientist:    PT Recommendations:  [place] / Referral to community resources:  [place]  Patient/Family's  Response to care:  Patient only oriented to self. Patient's son will discuss SNF vs. [name] with his siblings. Patient's children supportive and involved in patient's care. Patient's son appreciated social work intervention.  Patient/Family's Understanding of and Emotional Response to Diagnosis, Current Treatment, and Prognosis:  Patient only oriented to self. Patient's son has a good understanding of the reason for admission and her need for continued therapy once medically stable for discharge. Patient's son appears happy with hospital care.  Emotional Assessment Appearance:  Appears stated age Attitude/Demeanor/Rapport:  Unable to Assess Affect (typically observed):  Unable to Assess Orientation:  Oriented to Self Alcohol / Substance use:  Never Used Psych involvement (Current and /or in the community):  No (Comment)  Discharge Needs  Concerns to be addressed:  Care Coordination Readmission within the last 30 days:  No Current discharge risk:  Cognitively Impaired, Dependent with Mobility Barriers to Discharge:  Continued Medical Work up   Candie Chroman, LCSW 01/08/2018, 10:58 AM

## 2018-01-09 ENCOUNTER — Inpatient Hospital Stay (HOSPITAL_COMMUNITY): Payer: Medicare Other

## 2018-01-09 DIAGNOSIS — R4189 Other symptoms and signs involving cognitive functions and awareness: Secondary | ICD-10-CM

## 2018-01-09 DIAGNOSIS — G9340 Encephalopathy, unspecified: Secondary | ICD-10-CM

## 2018-01-09 LAB — BASIC METABOLIC PANEL
Anion gap: 10 (ref 5–15)
BUN: 5 mg/dL — AB (ref 6–20)
CHLORIDE: 108 mmol/L (ref 101–111)
CO2: 21 mmol/L — AB (ref 22–32)
Calcium: 8.7 mg/dL — ABNORMAL LOW (ref 8.9–10.3)
Creatinine, Ser: 0.93 mg/dL (ref 0.44–1.00)
GFR calc Af Amer: >60 mL/min (ref >60)
GFR calc non Af Amer: >60 mL/min (ref >60)
GLUCOSE: 132 mg/dL — AB (ref 65–99)
POTASSIUM: 3.3 mmol/L — AB (ref 3.5–5.1)
Sodium: 139 mmol/L (ref 135–145)

## 2018-01-09 LAB — CBC
HEMATOCRIT: 30.7 % — AB (ref 36.0–46.0)
HEMOGLOBIN: 10.8 g/dL — AB (ref 12.0–15.0)
MCH: 30.3 pg (ref 26.0–34.0)
MCHC: 35.2 g/dL (ref 30.0–36.0)
MCV: 86.2 fL (ref 78.0–100.0)
Platelets: 244 10*3/uL (ref 150–400)
RBC: 3.56 MIL/uL — AB (ref 3.87–5.11)
RDW: 13.6 % (ref 11.5–15.5)
WBC: 6.9 10*3/uL (ref 4.0–10.5)

## 2018-01-09 LAB — GLUCOSE, CAPILLARY
GLUCOSE-CAPILLARY: 129 mg/dL — AB (ref 65–99)
GLUCOSE-CAPILLARY: 106 mg/dL — AB (ref 65–99)
GLUCOSE-CAPILLARY: 129 mg/dL — AB (ref 65–99)
GLUCOSE-CAPILLARY: 136 mg/dL — AB (ref 65–99)
Glucose-Capillary: 183 mg/dL — ABNORMAL HIGH (ref 65–99)
Glucose-Capillary: 134 mg/dL — ABNORMAL HIGH (ref 65–99)
Glucose-Capillary: 143 mg/dL — ABNORMAL HIGH (ref 65–99)
Glucose-Capillary: 133 mg/dL — ABNORMAL HIGH (ref 65–99)
Glucose-Capillary: 159 mg/dL — ABNORMAL HIGH (ref 65–99)
Glucose-Capillary: 140 mg/dL — ABNORMAL HIGH (ref 65–99)

## 2018-01-09 LAB — CULTURE, BLOOD (ROUTINE X 2)
CULTURE: NO GROWTH
Culture: NO GROWTH
SPECIAL REQUESTS: ADEQUATE
Special Requests: ADEQUATE

## 2018-01-09 MED ORDER — POTASSIUM CHLORIDE 10 MEQ/100ML IV SOLN
10.0000 meq | INTRAVENOUS | Status: AC
  Administered 2018-01-09: 10 meq via INTRAVENOUS
  Filled 2018-01-09 (×3): qty 100

## 2018-01-09 MED ORDER — POTASSIUM CHLORIDE 10 MEQ/100ML IV SOLN
10.0000 meq | INTRAVENOUS | Status: AC
  Administered 2018-01-09 – 2018-01-10 (×2): 10 meq via INTRAVENOUS
  Filled 2018-01-09 (×2): qty 100

## 2018-01-09 MED ORDER — ENALAPRIL MALEATE 5 MG PO TABS
5.0000 mg | ORAL_TABLET | Freq: Two times a day (BID) | ORAL | Status: DC
  Administered 2018-01-10: 5 mg via ORAL
  Filled 2018-01-09 (×3): qty 1

## 2018-01-09 MED ORDER — PREMIER PROTEIN SHAKE
11.0000 [oz_av] | Freq: Two times a day (BID) | ORAL | Status: DC
  Administered 2018-01-10: 11 [oz_av] via ORAL
  Filled 2018-01-09 (×5): qty 325.31

## 2018-01-09 MED ORDER — SODIUM CHLORIDE 0.9 % IV SOLN
1.0000 g | Freq: Once | INTRAVENOUS | Status: AC
  Administered 2018-01-09: 1 g via INTRAVENOUS
  Filled 2018-01-09: qty 10

## 2018-01-09 NOTE — Procedures (Signed)
ELECTROENCEPHALOGRAM REPORT  Date of Study: 01/09/2018  Patient's Name: Cleo Santucci MRN: [id number] Date of Birth: [birth year]  Referring Provider: Dr. Rosalin Hawking  Clinical History: This is a 67 year old woman with altered mental status  Medications: Tylenol Vasotec Hydralazine Insulin Labetalol  Technical Summary: A multichannel digital EEG recording measured by the international 10-20 system with electrodes applied with paste and impedances below 5000 ohms performed as portable with EKG monitoring in an awake and asleep patient.  Hyperventilation and photic stimulation were not performed.  The digital EEG was referentially recorded, reformatted, and digitally filtered in a variety of bipolar and referential montages for optimal display.   Description: The patient is awake and asleep during the recording.  During maximal wakefulness, there is a symmetric, medium voltage 8 Hz posterior dominant rhythm that poorly attenuates with eye opening and eye closure. This is admixed with a small amount of diffuse 4-5 Hz theta and occasional diffuse 2-3 Hz delta slowing of the waking background.  During drowsiness and sleep, there is an increase in theta and delta slowing of the background with central beta activity seen. Hyperventilation and photic stimulation were not performed. There were no epileptiform discharges or electrographic seizures seen.    EKG lead was unremarkable.  Impression: This awake and asleep EEG is abnormal due to mild diffuse slowing of the waking background.  Clinical Correlation of the above findings indicates diffuse cerebral dysfunction that is non-specific in etiology and can be seen with hypoxic/ischemic injury, toxic/metabolic encephalopathies, or medication effect.  The absence of epileptiform discharges does not rule out a clinical diagnosis of epilepsy.  Clinical correlation is advised.   Ellouise Newer, M.D.

## 2018-01-09 NOTE — Progress Notes (Signed)
Initial Nutrition Assessment  DOCUMENTATION CODES:   Obesity unspecified  INTERVENTION:  Premier Protein BID, each supplement provides 160 calories and 30 gm protein  NUTRITION DIAGNOSIS:   Inadequate oral intake related to lethargy/confusion, dysphagia as evidenced by per patient/family report, meal completion < 50%.  GOAL:   Patient will meet greater than or equal to 90% of their needs  MONITOR:   PO intake, Supplement acceptance, I & O's, Labs, Weight trends  REASON FOR ASSESSMENT:   Consult Calorie Count  ASSESSMENT:   Sheila. Sheila [name] is a 67 y.o. female with PMH of  2 previous strokes the most recent of which was a left thalamic infarct in February 2017 with a residual right foot drop, hyperlipidemia, diabetes mellitus, peripheral arterial disease, and arthritis brought to [facility] today from her nursing facility as a code stroke with reports of acute onset Left sided weakness.  Attempted to speak with Sheila [name] at bedside. She provides a little history. Gives short, brief yes or no answers. Normally eats two meals per day, denies any weight loss, appetite is "so-so." Weight stable per chart. PO intake documented at 25% thus far. On NDD1 pureed, had lunch tray during time of visit - but had not touched it.  Labs reviewed:  K+ 3.3 Medications reviewed and include:  Insulin, 40k+ BID PO, 10K+ x3 IV NS at 73mL/hr   NUTRITION - FOCUSED PHYSICAL EXAM:    Most Recent Value  Orbital Region  No depletion  Upper Arm Region  No depletion  Thoracic and Lumbar Region  No depletion  Buccal Region  No depletion  Temple Region  No depletion  Clavicle Bone Region  No depletion  Clavicle and Acromion Bone Region  No depletion  Scapular Bone Region  No depletion  Dorsal Hand  No depletion  Patellar Region  No depletion  Anterior Thigh Region  No depletion  Posterior Calf Region  No depletion  Edema (RD Assessment)  Moderate  Hair  Reviewed  Eyes  Reviewed   Mouth  Reviewed  Skin  Reviewed  Nails  Reviewed       Diet Order:  DIET - DYS 1 Room service appropriate? Yes; Fluid consistency: Thin  EDUCATION NEEDS:   Not appropriate for education at this time  Skin:  Skin Assessment: Skin Integrity Issues: Skin Integrity Issues:: Other (Comment) Other: abrasions, ecchymosis to arms, chest  Last BM:  01/07/2018  Height:   Ht Readings from Last 1 Encounters:  01/04/18 5\' 3"  (1.6 m)    Weight:   Wt Readings from Last 1 Encounters:  01/07/18 188 lb 11.4 oz (85.6 kg)    Ideal Body Weight:  52.27 kg  BMI:  Body mass index is 33.43 kg/m.  Estimated Nutritional Needs:   Kcal:  1630-1800 calories  Protein:  85-102 grams  Fluid:  1.6-1.8L   Satira Anis. Sheila Apsey, Sheila, RD LDN Inpatient Clinical Dietitian Pager [phone]

## 2018-01-09 NOTE — Progress Notes (Signed)
CSW following to facilitate discharge planning. CSW spoke with patient's son earlier today to discuss recommendation for SNF, and see if family had made any decision on what to do with the patient at discharge. CSW validated patient's son's concerns, and discussed the difference between SNF level rehab and receiving therapy at the [name]. CSW provided facility list for SNF via email and faxed out referral.  CSW spoke with patient's son later this afternoon to see if the family had made any decision. Patient's son discussed how he had provided the information to his brother and sister, but was awaiting ability to call and talk with them about what to do. Patient's son discussed how he wanted to make the decision as a family, he didn't want to exclude them, but will just make the decision tomorrow if the siblings cannot come together. Patient's son confirmed that he would review information tonight and make a decision for a discharge tomorrow.  CSW will continue to follow.  Laveda Abbe, [place] Clinical Social Worker [phone]

## 2018-01-09 NOTE — NC FL2 (Signed)
[place] LEVEL OF CARE SCREENING TOOL     IDENTIFICATION  Patient Name: Sheila [name] Birthdate: [birth year] Sex: female Admission Date (Current Location): 01/04/2018  [facility] and [place] Number:  Herbalist and Address:  [facility] [place], [place] [address] [place], [place] [address]      Provider Number: [id number]  Attending Physician Name and Address:  Rosalin Hawking, MD  Relative Name and Phone Number:       Current Level of Care: Hospital Recommended Level of Care: [place] Prior Approval Number:    Date Approved/Denied:   PASRR Number: [id number]  Discharge Plan: SNF    Current Diagnoses: Patient Active Problem List   Diagnosis Date Noted  . Intraventricular hemorrhage ([place]) 01/05/2018  . Hypertensive crisis   . Lethargic 05/05/2017  . Low blood sugar 05/05/2017  . Nail abnormalities 12/31/2016  . Memory changes 09/16/2016  . Multiple falls 09/16/2016  . UTI (urinary tract infection) 02/28/2016  . Depression 02/28/2016  . Acute ischemic VBA thalamic stroke ([place])   . Stroke (cerebrum) ([place])   . Anomia 02/17/2016  . Stroke ([place]) 02/17/2016  . Peripheral arterial disease ([place]) 12/03/2015  . Neuropathy 09/05/2015  . Medicare annual wellness visit, subsequent 03/12/2015  . Routine general medical examination at a health care facility 10/08/2014  . Cerumen impaction 10/08/2014  . Low back pain potentially associated with radiculopathy 03/01/2013  . Hyperlipidemia 02/15/2012  . Type 2 diabetes mellitus with neurologic complication ([employer]) 94/50/3888    Orientation RESPIRATION BLADDER Height & Weight     (disoriented x 4)  Normal Incontinent, External catheter(catheter placed 01/04/18) Weight: 188 lb 11.4 oz (85.6 kg) Height:  5\' 3"  (160 cm)  BEHAVIORAL SYMPTOMS/MOOD NEUROLOGICAL BOWEL NUTRITION STATUS      Continent Diet(puree)  AMBULATORY STATUS COMMUNICATION OF NEEDS Skin   Extensive Assist Verbally  Skin abrasions(blisters on left arm)                       Personal Care Assistance Level of Assistance  Bathing, Feeding, Dressing Bathing Assistance: Maximum assistance Feeding assistance: Maximum assistance Dressing Assistance: Maximum assistance     Functional Limitations Info  Sight, Hearing, Speech Sight Info: Adequate Hearing Info: Adequate Speech Info: Impaired(aphasia)    SPECIAL CARE FACTORS FREQUENCY  PT (By licensed PT), OT (By licensed OT), Speech therapy     PT Frequency: 5x/wk OT Frequency: 5x/wk     Speech Therapy Frequency: 5x/wk      Contractures Contractures Info: Not present    Additional Factors Info  Code Status, Allergies, Insulin Sliding Scale Code Status Info: Full Allergies Info: Sulfa Antibiotics, Iopamidol, Paxil Paroxetine Hcl   Insulin Sliding Scale Info: 0-15 units every 4 hours       Current Medications (01/09/2018):  This is the current hospital active medication list Current Facility-Administered Medications  Medication Dose Route Frequency Provider Last Rate Last Dose  .  stroke: mapping our early stages of recovery book   Does not apply Once Greta Doom, MD      . 0.9 %  sodium chloride infusion   Intravenous Continuous Greta Doom, MD 75 mL/hr at 01/09/18 0430    . acetaminophen (TYLENOL) tablet 650 mg  650 mg Oral Q4H PRN Greta Doom, MD      . enalapril (VASOTEC) tablet 10 mg  10 mg Oral BID Rosalin Hawking, MD   10 mg at 01/08/18 2101  . hydrALAZINE (  APRESOLINE) injection 10 mg  10 mg Intravenous Q4H PRN Amie Portland, MD      . insulin aspart (novoLOG) injection 0-15 Units  0-15 Units Subcutaneous Q4H Flora Lipps, MD   2 Units at 01/09/18 0818  . labetalol (NORMODYNE,TRANDATE) injection 10 mg  10 mg Intravenous Q4H PRN Amie Portland, MD      . MEDLINE mouth rinse  15 mL Mouth Rinse BID Greta Doom, MD   15 mL at 01/08/18 2102  . pantoprazole (PROTONIX) EC tablet 40 mg  40 mg Oral  Daily Rosalin Hawking, MD   40 mg at 01/08/18 1013  . potassium chloride 20 MEQ/15ML (10%) solution 40 mEq  40 mEq Oral BID Roney Jaffe, MD   40 mEq at 01/08/18 2101     Discharge Medications: Please see discharge summary for a list of discharge medications.  Relevant Imaging Results:  Relevant Lab Results:   Additional Information SS#: [id number]  Geralynn Ochs, LCSW

## 2018-01-09 NOTE — Progress Notes (Signed)
Calorie Count Instructions  Please hang calorie count envelope on the patient's door. Document percent consumed for each item on the patient's meal tray ticket and keep in envelope. Also document percent of any supplement or snack pt consumes and keep documentation in envelope for RD to review.    Sheila [name]. Aarohi Redditt, MS, RD LDN Inpatient Clinical Dietitian Pager [phone]

## 2018-01-09 NOTE — Plan of Care (Signed)
  Education: Knowledge of disease or condition will improve 01/09/2018 1942 - Progressing by Bronson Curb, RN Knowledge of secondary prevention will improve 01/09/2018 1942 - Progressing by Bronson Curb, RN

## 2018-01-09 NOTE — Care Management Important Message (Signed)
Important Message  Patient Details  Name: Sheila [name] MRN: [id number] Date of Birth: 03/21/1951   Medicare Important Message Given:  Yes    Kingstin Heims [address] 01/09/2018, 4:00 PM

## 2018-01-09 NOTE — Progress Notes (Signed)
EEG completed, results pending. 

## 2018-01-09 NOTE — Consult Note (Addendum)
[facility] Nurse wound consult note Reason for Consult: Consult requested for bilat arms.  Right arm with a partial thickness skin tear; red and moist, small amt pink drainage; 3X7X.1cm Right hand with multiple patchy areas of partial thickness skin loss of unknown etiology; affected area is approx 6X7cm and areas are shallow and yellow, small amt yellow drainage, no odor.  Pt has been pulling off previous dressings and attempting to scratch bilat arms. Left upper arm with 2 partial thickness wounds; each .2X.2X.1cm, red and moist. Dressing procedure/placement/frequency: Foam dressings to left and right arm to protect and promote healing.  Xeroform gauze to right hand to promote healing.  Ace wrap to keep dressings in place and minimize scratching.  No family present to discuss plan of care. Please re-consult if further assistance is needed.  Thank-you,  Julien Girt MSN, [place], [place], [place], [place]

## 2018-01-09 NOTE — Progress Notes (Signed)
NEUROHOSPITALISTS STROKE TEAM - DAILY PROGRESS NOTE   SUBJECTIVE (INTERVAL HISTORY) No family members present.  Minimal spontaneous speech. Able to repeat some phrases after examiner. She is easily frustrated and teary -eyed Unable to follow most simple commands. Nurse states she may be eating a small amount more today. No swallowing concerns today, SLP to re-evaluate.      OBJECTIVE Lab Results: CBC:  Recent Labs  Lab 01/07/18 0707 01/08/18 0735 01/09/18 0803  WBC 7.1 6.7 6.9  HGB 10.9* 11.2* 10.8*  HCT 31.5* 32.2* 30.7*  MCV 85.6 85.6 86.2  PLT 233 261 244   BMP: Recent Labs  Lab 01/05/18 1910 01/06/18 1547 01/07/18 0707 01/08/18 0735 01/09/18 0803  NA 141 141 139 141 139  K 4.9 2.9* 3.0* 3.2* 3.3*  CL 107 107 105 108 108  CO2 21* 25 22 22  21*  GLUCOSE 147* 217* 143* 113* 132*  BUN 13 8 6  <5* 5*  CREATININE 1.11* 1.03* 0.91 0.89 0.93  CALCIUM 8.8* 8.6* 8.5* 8.9 8.7*   Liver Function Tests:  Recent Labs  Lab 01/04/18 1356 01/05/18 1910  AST 42* 47*  ALT 21 25  ALKPHOS 117 84  BILITOT 1.4* 0.9  PROT 6.9 6.1*  ALBUMIN 3.8 3.4*   Microbiology: Blood Cx - NGTF Urine Cx - NGTF Urinalysis:  Recent Labs  Lab 01/05/18 0926  COLORURINE YELLOW  APPEARANCEUR CLEAR  LABSPEC 1.013  PHURINE 6.0  GLUCOSEU 150*  HGBUR SMALL*  BILIRUBINUR NEGATIVE  KETONESUR NEGATIVE  PROTEINUR NEGATIVE  NITRITE POSITIVE*  LEUKOCYTESUR SMALL*   Urine Drug Screen:     Component Value Date/Time   LABOPIA NONE DETECTED 01/05/2018 0926   COCAINSCRNUR NONE DETECTED 01/05/2018 0926   LABBENZ POSITIVE (A) 01/05/2018 0926   AMPHETMU NONE DETECTED 01/05/2018 0926   THCU NONE DETECTED 01/05/2018 0926   LABBARB NONE DETECTED 01/05/2018 0926    Alcohol Level:  Recent Labs  Lab 01/04/18 1356  ETH <10   PHYSICAL EXAM Temp:  [98.3 F (36.8 C)-100.3 F (37.9 C)] 100 F (37.8 C) (01/14 1035) Pulse Rate:  [79-88] 87 (01/14  1035) Resp:  [17-20] 20 (01/14 1035) BP: (99-163)/(47-66) 99/56 (01/14 1035) SpO2:  [94 %-100 %] 97 % (01/14 1035)   Vitals:   01/08/18 1735 01/08/18 2107 01/09/18 0438 01/09/18 1035  BP: (!) 125/49 (!) 135/55 (!) 125/47 (!) 99/56  Pulse: 79 88 79 87  Resp: 20 18 18 20   Temp: 98.4 F (36.9 C) 98.3 F (36.8 C) 100.3 F (37.9 C) 100 F (37.8 C)  TempSrc: Oral Oral Axillary Oral  SpO2: 100% 100% 100% 97%  Weight:      Height:        General - Well nourished, well developed, in no apparent distress HEENT-  Normocephalic,   Cardiovascular - Regular rate and rhythm  Respiratory - Lungs clear bilaterally. No wheezing. Abdomen - soft and non-tender, BS normal Extremities- no edema or cyanosis Neuro Exam : Mental Status: Patient is awake alert, appears to have partial global aphasia, not able to follow simple commands or answer questions, seems to have word finding difficulty, however, has intermittent spontaneous speech. Able to repeat but not naming.  Cranial Nerves: II: blinking to visual threat bilaterally. PERRL  III,IV, VI: no gaze preference  V: VII: blinks to eyelid stimulaiton bilaterally.  XII: tongue is midline without atrophy or fasciculations.  Motor: She moves all extremities with good strength, no focal weakness  Sensory: No deficits  Cerebellar: Does not perform, but  no apparent ataxia.   IMAGING: I have personally reviewed the radiological images below and agree with the radiology interpretations.  CT Head Wo Contrast  01/08/2017 IMPRESSION: 1. Degenerating intraventricular hemorrhage without hydrocephalus.   Small volume new, possibly redistributed subarachnoid blood products. 2. Stable severe chronic small vessel ischemic disease and old LEFT thalamus lacunar infarct. 3. These results will be called to the ordering clinician or representative by the Radiologist Assistant, and communication documented in the PACS or zVision Dashboard.  Ct Head Wo  Contrast Result Date: 01/05/2018 IMPRESSION: Unchanged appearance of intraventricular blood within the occipital horns of both lateral ventricles. No new site of hemorrhage or new ventricular dilatation. Electronically Signed   By: Ulyses Jarred M.D.   On: 01/05/2018 05:15   Ct Head Wo Contrast Result Date: 01/04/2018 IMPRESSION: 1. Suspected redistribution of intraventricular blood, now within both occipital horns of the lateral ventricles. 2. Unchanged size and configuration of the ventricles. 3. No new site of hemorrhage. Electronically Signed   By: Ulyses Jarred M.D.   On: 01/04/2018 22:13   Ct Head Wo Contrast Result Date: 01/04/2018 IMPRESSION: 1. New intraventricular hemorrhage layering in the occipital horn on the left. No hydrocephalus. 2. No visible acute gray matter infarct. 3. Severe white matter disease with volume loss. Remote lacunar infarct in the left thalamus. Electronically Signed   By: Monte Fantasia M.D.   On: 01/04/2018 16:21   Ct Angio Head Lavetta Nielsen W Or Wo Contrast Result Date: 01/04/2018 IMPRESSION: No large or medium vessel occlusion. No significant carotid bifurcation disease. No stenosis on the left. Focal calcified plaque at the distal bulb on the right but without stenosis when compared to the more distal cervical ICA. 50% stenosis the left subclavian artery just beyond the origin. Peripheral calcification throughout both carotid siphon regions. Generalized narrowing throughout the siphon regions, with stenosis estimated in the 30-50% range. These results were communicated to Dr. Leonel Ramsay At 2:43 pmon 1/9/2019by text page via the [facility] messaging system. Electronically Signed   By: Nelson Chimes M.D.   On: 01/04/2018 14:45   Mr Brain Wo Contrast Result Date: 01/05/2018 IMPRESSION: Motion degradation on multiple sequences. 1. Stable intraventricular hemorrhage pooling in left greater than right occipital horns. Stable ventricle size. 2. No stroke, new acute hemorrhage, or  significant mass effect identified. 3. Advanced chronic microvascular ischemic changes and parenchymal volume loss of the brain. Electronically Signed   By: Kristine Garbe M.D.   On: 01/05/2018 17:34   Ct Head Code Stroke Wo Contrast Result Date: 01/04/2018 IMPRESSION: 1. No acute finding. Advanced chronic small-vessel ischemic changes throughout the hemispheric white matter. Old left thalamic infarction. 2. ASPECTS is 10. 3. These results were communicated to Dr. Leonel Ramsay At 2:12 pmon 1/9/2019by text page via the [facility] messaging system. Electronically Signed   By: Nelson Chimes M.D.   On: 01/04/2018 14:14   Echocardiogram:                                               Study Conclusions - Left ventricle: The cavity size was normal. There was mild   concentric hypertrophy. Systolic function was vigorous. The   estimated ejection fraction was in the range of 65% to 70%. Wall   motion was normal; there were no regional wall motion   abnormalities. Doppler parameters are consistent with abnormal   left ventricular  relaxation (grade 1 diastolic dysfunction). - Mitral valve: Mildly calcified annulus. Valve area by continuity   equation (using LVOT flow): 1.79 cm^2. - Left atrium: The atrium was mildly dilated. - Pulmonary arteries: Systolic pressure was mildly increased. PA   peak pressure: 34 mm Hg (S).  Impressions:  No cardiac source of embolism was identified, but cannot be ruled   out on the basis of this examination.    EEG 01/04/18:                                                               EEG Abnormalities: 1) possible asymmetric PDR, though I do not think this is definite by the study. 2) generalized irregular slow activity Clinical Interpretation: This is a limited EEG is consistent with a generalized nonspecific cerebral dysfunction, with possible superimposed right hemispheric dysfunction.   There was no seizure or seizure predisposition recorded on this study    EEG 01/09/18:       Impression: This awake and asleep EEG is abnormal due to mild diffuse slowing of the waking background. There were no epileptiform discharges or electrographic seizures seen.       IMPRESSION: Ms. Myisha Pickerel is a 67 y.o. female with PMH of  2 previous strokes the most recent of which was a left thalamic infarct in February 2017 with a residual right foot drop, hyperlipidemia, diabetes mellitus, peripheral arterial disease, and arthritis brought to [facility] today from her nursing facility as a code stroke with reports of acute onset Left sided weakness. IV TPA was given at 1424 on 01/04/2018.  Per the nursing tech who witnessed the event.  The patient became suddenly agitated, put her right arm to her head and pushed back from the table that she was sitting at.  At that point, the tech realized she was only pushing with her right leg and therefore was "spinning in circles" as she was trying to push away.  She was not moving the left arm or leg therefore EMS was called.  En route, the left arm and leg weakness improved, however she was still suffering from a left facial droop, severe left hemineglect, left hemianopia, as well as severe confusion.  Given this picture, she was given IV TPA. Family was not present so was administered with emergency consent after consultation with ER physician. Following IV tPA, the patient remained quite agitated, and this plus her paucity of speech prompted a stat EEG.   BP spiked following severe agitation, ativan given to confirm BP, following this it remained elevated and CT was obtained showing IVH. Giving cryoprecipitate and tranexamic acid  Patient with high fever, mental status changes on arrival. Meningitis may need to be considered as well. hence ordered empiric antibiotics.  Patient had repeat CTH. Increased IVH with no hydro. SBP 120-130s on Cleviprex. Exam essentially unchanged from prior exam with exception of decrease  in level of arousal, but is able to open eyes to voice. Not following commands. Not agitated. D/W NSGY on call over phone - no neurosurgical intervention indicated at this time. Repeat CTH at 0400 hrs stable ICH  New intraventricular hemorrhage layering in the occipital horn on the left.  No stroke, new acute hemorrhage post TPA reversed with cryo and TXA no significant  mass effect identified.  Suspected Etiology: Hemorrhage s/p IV tPA for stroke mimic Resultant Symptoms:  left facial droop, severe left hemineglect, left hemianopia, severe confusion. Now improved Stroke Risk Factors: diabetes mellitus, hyperlipidemia and hypertension Other Stroke Risk Factors: Advanced age, Obesity, Body mass index is 33.43 kg/m.  Hx stroke, PAD  ASSESSMENT 01/08/2018:  Patient still has partial global aphasia, not following commands. Repeat CT this am showed stable IVH. Language deficit etiology unclear, could be due to underlying dementia or seizure or DWI-negative stroke. Will repeat EEG and limited MRI. Passed swallow, encourage po intake. PT/OT recommend SNF.  ASSESSMENT 01/09/2018:                                            No change in Neuro exam. Continues to not follow any commands. Repeat EEG Negative Unable to get MRI due to patient uncooperative. Will attempt CT Head. Electrolyte replacement in progress. SLP re-evaluation in progress. Patient PO intake slight improved from yesterday. Awaiting answer from [facility] unit and bed availability. Attempted to call son, no answer.  PLAN  01/09/2018: Continue Statin HOLD ASA until 24 hour post tPA neuroimaging is stable & without evidence of bleeding Frequent neuro checks Telemetry monitoring PT/OT/SLP Consult Case Management /MSW Ongoing aggressive stroke risk factor management Patient's family  will be counseled to be compliant with her antithrombotic medications Patient's family will be counseled on Lifestyle modifications including,  Diet, Exercise, and Stress Follow up with [facility] in 6 weeks  HX OF STROKES: Remote lacunar infarct in the left thalamus.  MEDICAL ISSUES:  S/p TPA f/b TXA and cryo for acute IVH  Acute allergic reaction to IV contrast  Acute Encephalopathy Patient still has partial global aphasia, not following commands.  Language deficit etiology unclear  could be due to underlying dementia or seizure or DWI-negative stroke.  Attempted MRI x 3, patient uncooperative Will attempt CT Head today  SEIZURES:  Repeat EEG- PENDING  No seizure activity noted  Maintain Seizure precautions  Anemia Stable, Will continue to monitor closely Repeat labs in AM  Hypokalemia   3.3 today - supplement in progress.  Repeat labs in AM  Hypocalcemia Replacement in progress Repeat labs in AM  Fever:  Possible Sepsis  Blood and urine cultures on admission no growth - febrile today - WBC 6.9 today  Will Check CXR today and repeat U/A  Discontinued antibiotics over the weekend  Will continue to monitor closley  Skin Tear on Left upper Arm Wound care RN consulted  HYPERTENSION:   Some episodes of hypotension, Vasotec dose decreased  Will continue to monitor closely  BP goal: 120-140 SBP range  Long term BP goal normotensive.  Home Meds: NONE  PO Intake Nursing continues to report poor PO intake - Dysphagia 1 thin liquids  Speech to re evaluate swallowing  48 hr calorie count  Will check prealbumin and consider Megace if necessary  HYPERLIPIDEMIA:    Component Value Date/Time   CHOL 127 01/05/2018 1910   TRIG 91 01/05/2018 1910   HDL 52 01/05/2018 1910   CHOLHDL 2.4 01/05/2018 1910   VLDL 18 01/05/2018 1910   LDLCALC 57 01/05/2018 1910    Home Meds:  Lipitor 40  LDL  goal < 70  Will continue on Lipitor to 80 mg daily, once medically stable  Continue statin at discharge  DIABETES: Lab  Results  Component Value Date   HGBA1C 5.5 01/05/2018    HgbA1c goal < 7.0  Currently on: Novolog  Continue CBG monitoring and SSI to maintain glucose 140-180 mg/dl  DM education   TOBACCO ABUSE & POLYSUBSTANCE ABUSE  UDS+ benzodiazapines  Current smoker  Smoking cessation counseling provided  Nicotine patch provided  OBESITY  Obesity, Body mass index is 33.43 kg/m. Greater than/equal to 30  Other Active Problems: Active Problems:   Type 2 diabetes mellitus with neurologic complication (HCC)   Stroke (cerebrum) (HCC)   Intraventricular hemorrhage ([place])   Hypertensive crisis    Hospital day # 5 VTE prophylaxis: SCD's  Diet : DIET - DYS 1 Room service appropriate? Yes; Fluid consistency: Thin   FAMILY UPDATES: No family at bedside, attempted to call son, no answer  TEAM UPDATES: Rosalin Hawking, MD   Prior Home Stroke Medications:  clopidogrel 75 mg daily  Discharge Stroke Meds:  Please discharge patient on TBD , Likely to start ASA prior to discharge  Disposition: 01-Home or Self Care Therapy Recs:     SNF, discussed with MSW, Patients son is looking into determining if patient may go back to [facility] in the memory unit. Awaiting answer. Home Equipment:         PENDING Follow Up:  Follow-up Information    Garvin Fila, MD. Schedule an appointment as soon as possible for a visit in 6 week(s).   Specialties:  Neurology, Radiology Contact information: [address] [place] [address] [phone]          [place] [place] -PCP Follow up in 1-2 weeks          Renie Ora Neurology Stroke Team 01/09/2018, 12:27 PM   ATTENDING NOTE: I reviewed above note and agree with the assessment and plan. I have made any additions or clarifications directly to the above note. Pt was seen and examined.   67 year old female with history of stroke, obesity, smoker, hypertension, hyperlipidemia, diabetes, PAD, SNF resident admitted for strokelike symptoms.  Status post TPA.  Repeat CT  showed bilateral IVH.  MRI showed no acute stroke but bilateral IVH stable.  EF 65-70%, LDL 57 and A1c 5.5.  EEG no seizure.  IVH likely related to TPA use.  Currently blood pressure under control.  Patient still has limited language output with simple words, able to repeat, but not naming, not following commands. Repeat CT showed stable IVH. Language deficit etiology unclear, could be due to worsening underlying dementia or seizure or DWI-negative stroke. Repeat EEG showed diffuse slowing. Pt not able to tolerate limited MRI. Encourage po intake. PT/OT recommend SNF. Need to talk son regarding baseline neuro status.   Rosalin Hawking, MD PhD Stroke Neurology 01/10/2018 12:02 AM    To contact Stroke Continuity provider, please refer to [email or web address] After hours, contact General Neurology

## 2018-01-09 NOTE — Progress Notes (Signed)
SLP Cancellation Note  Patient Details Name: Sheila [name] MRN: [id number] DOB: 07-02-1951   Cancelled treatment:       Reason Eval/Treat Not Completed: Patient at procedure or test/unavailable. Attempted x2. Of note, SLP is already following pt for cognitive linguistic goals. Discussed with NP.    Alaysiah Browder, Katherene Ponto 01/09/2018, 1:10 PM

## 2018-01-10 DIAGNOSIS — M6281 Muscle weakness (generalized): Secondary | ICD-10-CM | POA: Diagnosis not present

## 2018-01-10 DIAGNOSIS — Z9181 History of falling: Secondary | ICD-10-CM | POA: Diagnosis not present

## 2018-01-10 DIAGNOSIS — E876 Hypokalemia: Secondary | ICD-10-CM | POA: Diagnosis not present

## 2018-01-10 DIAGNOSIS — R456 Violent behavior: Secondary | ICD-10-CM | POA: Diagnosis not present

## 2018-01-10 DIAGNOSIS — R1312 Dysphagia, oropharyngeal phase: Secondary | ICD-10-CM | POA: Diagnosis not present

## 2018-01-10 DIAGNOSIS — R2689 Other abnormalities of gait and mobility: Secondary | ICD-10-CM | POA: Diagnosis not present

## 2018-01-10 DIAGNOSIS — I615 Nontraumatic intracerebral hemorrhage, intraventricular: Secondary | ICD-10-CM | POA: Diagnosis not present

## 2018-01-10 DIAGNOSIS — R4701 Aphasia: Secondary | ICD-10-CM

## 2018-01-10 DIAGNOSIS — B9620 Unspecified Escherichia coli [E. coli] as the cause of diseases classified elsewhere: Secondary | ICD-10-CM | POA: Diagnosis not present

## 2018-01-10 DIAGNOSIS — I69091 Dysphagia following nontraumatic subarachnoid hemorrhage: Secondary | ICD-10-CM | POA: Diagnosis not present

## 2018-01-10 DIAGNOSIS — E1149 Type 2 diabetes mellitus with other diabetic neurological complication: Secondary | ICD-10-CM | POA: Diagnosis not present

## 2018-01-10 DIAGNOSIS — I69398 Other sequelae of cerebral infarction: Secondary | ICD-10-CM | POA: Diagnosis not present

## 2018-01-10 DIAGNOSIS — N390 Urinary tract infection, site not specified: Secondary | ICD-10-CM | POA: Diagnosis not present

## 2018-01-10 DIAGNOSIS — E7849 Other hyperlipidemia: Secondary | ICD-10-CM | POA: Diagnosis not present

## 2018-01-10 DIAGNOSIS — R799 Abnormal finding of blood chemistry, unspecified: Secondary | ICD-10-CM | POA: Diagnosis not present

## 2018-01-10 DIAGNOSIS — R5381 Other malaise: Secondary | ICD-10-CM | POA: Diagnosis not present

## 2018-01-10 DIAGNOSIS — F419 Anxiety disorder, unspecified: Secondary | ICD-10-CM | POA: Diagnosis not present

## 2018-01-10 DIAGNOSIS — I629 Nontraumatic intracranial hemorrhage, unspecified: Secondary | ICD-10-CM | POA: Diagnosis not present

## 2018-01-10 DIAGNOSIS — I69220 Aphasia following other nontraumatic intracranial hemorrhage: Secondary | ICD-10-CM | POA: Diagnosis not present

## 2018-01-10 DIAGNOSIS — R411 Anterograde amnesia: Secondary | ICD-10-CM | POA: Diagnosis not present

## 2018-01-10 DIAGNOSIS — I10 Essential (primary) hypertension: Secondary | ICD-10-CM | POA: Diagnosis not present

## 2018-01-10 DIAGNOSIS — L98499 Non-pressure chronic ulcer of skin of other sites with unspecified severity: Secondary | ICD-10-CM | POA: Diagnosis not present

## 2018-01-10 DIAGNOSIS — W19XXXA Unspecified fall, initial encounter: Secondary | ICD-10-CM | POA: Diagnosis not present

## 2018-01-10 DIAGNOSIS — E1140 Type 2 diabetes mellitus with diabetic neuropathy, unspecified: Secondary | ICD-10-CM | POA: Diagnosis not present

## 2018-01-10 DIAGNOSIS — R5382 Chronic fatigue, unspecified: Secondary | ICD-10-CM | POA: Diagnosis not present

## 2018-01-10 DIAGNOSIS — E119 Type 2 diabetes mellitus without complications: Secondary | ICD-10-CM | POA: Diagnosis not present

## 2018-01-10 DIAGNOSIS — R262 Difficulty in walking, not elsewhere classified: Secondary | ICD-10-CM | POA: Diagnosis not present

## 2018-01-10 DIAGNOSIS — Z794 Long term (current) use of insulin: Secondary | ICD-10-CM | POA: Diagnosis not present

## 2018-01-10 DIAGNOSIS — R410 Disorientation, unspecified: Secondary | ICD-10-CM | POA: Diagnosis not present

## 2018-01-10 DIAGNOSIS — I63549 Cerebral infarction due to unspecified occlusion or stenosis of unspecified cerebellar artery: Secondary | ICD-10-CM | POA: Diagnosis not present

## 2018-01-10 DIAGNOSIS — F411 Generalized anxiety disorder: Secondary | ICD-10-CM | POA: Diagnosis not present

## 2018-01-10 DIAGNOSIS — I169 Hypertensive crisis, unspecified: Secondary | ICD-10-CM | POA: Diagnosis not present

## 2018-01-10 DIAGNOSIS — R4189 Other symptoms and signs involving cognitive functions and awareness: Secondary | ICD-10-CM | POA: Diagnosis not present

## 2018-01-10 LAB — GLUCOSE, CAPILLARY
GLUCOSE-CAPILLARY: 148 mg/dL — AB (ref 65–99)
GLUCOSE-CAPILLARY: 184 mg/dL — AB (ref 65–99)
Glucose-Capillary: 135 mg/dL — ABNORMAL HIGH (ref 65–99)
Glucose-Capillary: 147 mg/dL — ABNORMAL HIGH (ref 65–99)

## 2018-01-10 LAB — CBC
HEMATOCRIT: 32.2 % — AB (ref 36.0–46.0)
HEMOGLOBIN: 11.9 g/dL — AB (ref 12.0–15.0)
MCH: 31.6 pg (ref 26.0–34.0)
MCHC: 37 g/dL — AB (ref 30.0–36.0)
MCV: 85.6 fL (ref 78.0–100.0)
Platelets: 242 10*3/uL (ref 150–400)
RBC: 3.76 MIL/uL — AB (ref 3.87–5.11)
RDW: 13.5 % (ref 11.5–15.5)
WBC: 8.4 10*3/uL (ref 4.0–10.5)

## 2018-01-10 LAB — BASIC METABOLIC PANEL
Anion gap: 10 (ref 5–15)
BUN: 7 mg/dL (ref 6–20)
CHLORIDE: 107 mmol/L (ref 101–111)
CO2: 22 mmol/L (ref 22–32)
CREATININE: 0.86 mg/dL (ref 0.44–1.00)
Calcium: 9.3 mg/dL (ref 8.9–10.3)
GFR calc Af Amer: >60 mL/min (ref >60)
GFR calc non Af Amer: >60 mL/min (ref >60)
GLUCOSE: 147 mg/dL — AB (ref 65–99)
POTASSIUM: 4.1 mmol/L (ref 3.5–5.1)
SODIUM: 139 mmol/L (ref 135–145)

## 2018-01-10 LAB — MAGNESIUM: MAGNESIUM: 1.5 mg/dL — AB (ref 1.7–2.4)

## 2018-01-10 LAB — PREALBUMIN: Prealbumin: 22 mg/dL (ref 18–38)

## 2018-01-10 LAB — PHOSPHORUS: Phosphorus: 3.4 mg/dL (ref 2.5–4.6)

## 2018-01-10 MED ORDER — ATORVASTATIN CALCIUM 20 MG PO TABS: 20.0000 mg | ORAL_TABLET | Freq: Every day | ORAL | 1 refills | Status: AC

## 2018-01-10 MED ORDER — ATORVASTATIN CALCIUM 10 MG PO TABS
20.0000 mg | ORAL_TABLET | Freq: Every day | ORAL | Status: DC
  Administered 2018-01-10: 20 mg via ORAL
  Filled 2018-01-10: qty 2

## 2018-01-10 MED ORDER — ATORVASTATIN CALCIUM 40 MG PO TABS: 40.0000 mg | ORAL_TABLET | Freq: Every day | ORAL | Status: DC

## 2018-01-10 MED ORDER — POTASSIUM CHLORIDE ER 10 MEQ PO TBCR: 10.0000 meq | EXTENDED_RELEASE_TABLET | Freq: Every day | ORAL | 1 refills | Status: AC

## 2018-01-10 MED ORDER — ENALAPRIL MALEATE 5 MG PO TABS: 5.0000 mg | ORAL_TABLET | Freq: Two times a day (BID) | ORAL | 1 refills | Status: DC

## 2018-01-10 MED ORDER — MAGNESIUM OXIDE 400 (241.3 MG) MG PO TABS
400.0000 mg | ORAL_TABLET | Freq: Every day | ORAL | Status: DC
  Administered 2018-01-10: 400 mg via ORAL
  Filled 2018-01-10: qty 1

## 2018-01-10 MED ORDER — PREMIER PROTEIN SHAKE: 11.0000 [oz_av] | Freq: Two times a day (BID) | ORAL | 0 refills | Status: DC

## 2018-01-10 MED ORDER — MAGNESIUM OXIDE 400 (241.3 MG) MG PO TABS: 400.0000 mg | ORAL_TABLET | Freq: Every day | ORAL | 1 refills | Status: AC

## 2018-01-10 NOTE — Care Management Note (Signed)
Case Management Note  Patient Details  Name: Sheila [name] MRN: [id number] Date of Birth: 1951-03-18  Subjective/Objective:                    Action/Plan: Pt discharging to [facility] today. No further needs per CM.  Expected Discharge Date:  01/10/18               Expected Discharge Plan:  Skilled Nursing Facility  In-House Referral:  Clinical Social Work  Discharge planning Services  CM Consult  Post Acute Care Choice:    Choice offered to:     DME Arranged:    DME Agency:     HH Arranged:    [place] Agency:     Status of Service:  Completed, signed off  If discussed at [employer], dates discussed:    Additional Comments:  Pollie Friar, RN 01/10/2018, 3:43 PM

## 2018-01-10 NOTE — Care Management Important Message (Signed)
Important Message  Patient Details  Name: Sheila [name] MRN: [id number] Date of Birth: March 02, 1951   Medicare Important Message Given:  Yes  On 01/09/2018 patient was not able to sign due to illness.  Greenley Martone 01/10/2018, 8:50 AM

## 2018-01-10 NOTE — Progress Notes (Signed)
SLP Cancellation Note  Patient Details Name: Sheila [name] MRN: [id number] DOB: 03/04/1951   Cancelled treatment:       Reason Eval/Treat Not Completed: Patient at procedure or test/unavailable(Pt with PT, will follow up as pt is available )   Emilio Math 01/10/2018, 3:33 PM

## 2018-01-10 NOTE — NC FL2 (Signed)
[place] LEVEL OF CARE SCREENING TOOL     IDENTIFICATION  Patient Name: Sheila [name] Birthdate: [birth year] Sex: female Admission Date (Current Location): 01/04/2018  [facility] and [place] Number:  Herbalist and Address:  [facility] [place], [place] [address], [place], [place] [address]      Provider Number: [id number]  Attending Physician Name and Address:  Rosalin Hawking, MD  Relative Name and Phone Number:       Current Level of Care: Hospital Recommended Level of Care: [place] Prior Approval Number:    Date Approved/Denied:   PASRR Number: [id number]  Discharge Plan: Other (Comment)([name])    Current Diagnoses: Patient Active Problem List   Diagnosis Date Noted  . Intraventricular hemorrhage ([place]) 01/05/2018  . Hypertensive crisis   . Lethargic 05/05/2017  . Low blood sugar 05/05/2017  . Nail abnormalities 12/31/2016  . Memory changes 09/16/2016  . Multiple falls 09/16/2016  . UTI (urinary tract infection) 02/28/2016  . Depression 02/28/2016  . Acute ischemic VBA thalamic stroke ([place])   . Stroke (cerebrum) ([place])   . Anomia 02/17/2016  . Stroke ([place]) 02/17/2016  . Peripheral arterial disease ([place]) 12/03/2015  . Neuropathy 09/05/2015  . Medicare annual wellness visit, subsequent 03/12/2015  . Routine general medical examination at a health care facility 10/08/2014  . Cerumen impaction 10/08/2014  . Low back pain potentially associated with radiculopathy 03/01/2013  . Hyperlipidemia 02/15/2012  . Type 2 diabetes mellitus with neurologic complication ([place]) 67/61/9509    Orientation RESPIRATION BLADDER Height & Weight     (disoriented x 4)  Normal Incontinent, External catheter(catheter placed 01/04/18) Weight: 188 lb 11.4 oz (85.6 kg) Height:  5\' 3"  (160 cm)  BEHAVIORAL SYMPTOMS/MOOD NEUROLOGICAL BOWEL NUTRITION STATUS      Continent Diet(puree solids)  AMBULATORY STATUS COMMUNICATION OF NEEDS Skin    Extensive Assist Verbally Skin abrasions(blisters on left arm)                       Personal Care Assistance Level of Assistance  Bathing, Feeding, Dressing Bathing Assistance: Maximum assistance Feeding assistance: Maximum assistance Dressing Assistance: Maximum assistance     Functional Limitations Info  Sight, Hearing, Speech Sight Info: Adequate Hearing Info: Adequate Speech Info: Adequate    SPECIAL CARE FACTORS FREQUENCY  PT (By licensed PT), OT (By licensed OT), Speech therapy     PT Frequency: 3x/wk with home health OT Frequency: 3x/wk with home health     Speech Therapy Frequency: 3x/wk with home health      Contractures Contractures Info: Not present    Additional Factors Info  Code Status, Allergies, Insulin Sliding Scale Code Status Info: Full Allergies Info: Sulfa Antibiotics, Iopamidol, Paxil Paroxetine Hcl   Insulin Sliding Scale Info: 0-15 units every 4 hours       Current Medications (01/10/2018):  This is the current hospital active medication list Current Facility-Administered Medications  Medication Dose Route Frequency Provider Last Rate Last Dose  .  stroke: mapping our early stages of recovery book   Does not apply Once Greta Doom, MD      . 0.9 %  sodium chloride infusion   Intravenous Continuous Greta Doom, MD 75 mL/hr at 01/10/18 0854 75 mL at 01/10/18 0854  . acetaminophen (TYLENOL) tablet 650 mg  650 mg Oral Q4H PRN Greta Doom, MD      . enalapril (VASOTEC) tablet 5 mg  5 mg Oral  BID Mary Sella, NP   5 mg at 01/10/18 0854  . hydrALAZINE (APRESOLINE) injection 10 mg  10 mg Intravenous Q4H PRN Amie Portland, MD      . insulin aspart (novoLOG) injection 0-15 Units  0-15 Units Subcutaneous Q4H Flora Lipps, MD   2 Units at 01/10/18 0855  . labetalol (NORMODYNE,TRANDATE) injection 10 mg  10 mg Intravenous Q4H PRN Amie Portland, MD      . MEDLINE mouth rinse  15 mL Mouth Rinse BID Greta Doom, MD   15 mL at 01/10/18 0855  . pantoprazole (PROTONIX) EC tablet 40 mg  40 mg Oral Daily Rosalin Hawking, MD   40 mg at 01/10/18 0855  . potassium chloride 20 MEQ/15ML (10%) solution 40 mEq  40 mEq Oral BID Roney Jaffe, MD   40 mEq at 01/10/18 0854  . protein supplement (PREMIER PROTEIN) liquid  11 oz Oral BID BM Rosalin Hawking, MD   11 oz at 01/10/18 6004     Discharge Medications: Please see discharge summary for a list of discharge medications.  Relevant Imaging Results:  Relevant Lab Results:   Additional Information SS#: [id number]  Geralynn Ochs, LCSW

## 2018-01-10 NOTE — Clinical Social Work Placement (Signed)
Nurse to call report to [phone], Room 123B  Transport set up for 5:00 PM.    CLINICAL SOCIAL WORK PLACEMENT  NOTE  Date:  01/10/2018  Patient Details  Name: Sheila [name] MRN: [id number] Date of Birth: 07-12-1951  Clinical Social Work is seeking post-discharge placement for this patient at the [place] level of care (*CSW will initial, date and re-position this form in  chart as items are completed):  Yes   Patient/family provided with [place] Work Department's list of facilities offering this level of care within the geographic area requested by the patient (or if unable, by the patient's family).  Yes   Patient/family informed of their freedom to choose among providers that offer the needed level of care, that participate in Medicare, Medicaid or managed care program needed by the patient, have an available bed and are willing to accept the patient.  Yes   Patient/family informed of [facility]'s ownership interest in [facility] and [facility], as well as of the fact that they are under no obligation to receive care at these facilities.  PASRR submitted to EDS on 01/09/18     PASRR number received on 01/09/18     Existing PASRR number confirmed on       FL2 transmitted to all facilities in geographic area requested by pt/family on 01/09/18     FL2 transmitted to all facilities within larger geographic area on       Patient informed that his/her managed care company has contracts with or will negotiate with certain facilities, including the following:        Yes   Patient/family informed of bed offers received.  Patient chooses bed at [facility]     Physician recommends and patient chooses bed at      Patient to be transferred to [facility] on 01/10/18.  Patient to be transferred to facility by [facility]     Patient family notified on 01/10/18 of transfer.  Name of family member notified:  Elta Guadeloupe      PHYSICIAN       Additional Comment:    _______________________________________________ Geralynn Ochs, LCSW 01/10/2018, 4:05 PM

## 2018-01-10 NOTE — Progress Notes (Signed)
CSW following for discharge planning. CSW spoke with Dorian Pod, Clinical Liaison at [facility], who came to assess the patient this morning. Per Dorian Pod, patient was a +1 assist at baseline and was getting close to being wheelchair bound, but not completely. Patient is not at her baseline, and will need rehabilitation prior to returning to [name].   Per PT/OT notes, they had signed off as patient appeared to be at baseline; patient will need updated PT and OT to recommend SNF follow-up with therapy services to assist patient in returning to baseline level of functioning prior to returning to [name].  CSW spoke with patient's son, Elta Guadeloupe, who indicated understanding. Elta Guadeloupe said he would prefer for patient to return to [facility], but was understanding if they could not accommodate her needs at this time. Mark requested information on [facility], and CSW provided Admissions at [facility] with contact information to give him a call.  CSW will continue to follow.  Laveda Abbe, [place] Clinical Social Worker [phone]

## 2018-01-10 NOTE — Discharge Summary (Signed)
Stroke Discharge Summary  Patient ID: Sheila [name]   MRN: [id number]      DOB: 05/30/1951  Date of Admission: 01/04/2018 Date of Discharge: 01/10/2018  Attending Physician:  Rosalin Hawking, MD, Stroke MD Consultant(s):   Treatment Team:  Stroke, Md, MD pulmonary/intensive care  Patient's PCP:  [place], FNP  DISCHARGE DIAGNOSIS:  IVH after tPA due to stroke like symptoms  Active Problems:   Type 2 diabetes mellitus with neurologic complication ([place])   Hypertensive crisis Acute allergic reaction to IV contrast Acute Encephalopathy Anemia Hypokalemia Hypocalcemia Hypomagnesemia Fever Skin Tear on Left Upper Arm Poor PO intake Hyperlipidemia Obesity  Past Medical History:  Diagnosis Date  . Arthritis   . Diabetes mellitus    complicated by peripheral neuropathy  . Hyperlipidemia   . Stroke [facility]) 2015   residual right foot drop   Past Surgical History:  Procedure Laterality Date  . CHOLECYSTECTOMY    . TONSILLECTOMY      Allergies as of 01/10/2018      Reactions   Sulfa Antibiotics Other (See Comments)   As noted on MAR   Iopamidol    rash   Paxil [paroxetine Hcl] Other (See Comments)   Dreams/ nightmares      Medication List    TAKE these medications   atorvastatin 20 MG tablet Commonly known as:  LIPITOR Take 1 tablet (20 mg total) by mouth daily at 6 PM. What changed:    medication strength  how much to take   DAILY-VITE Tabs Take 1 tablet by mouth every morning.   Dulaglutide 0.75 MG/0.5ML Sopn Commonly known as:  TRULICITY INJECT 4.09 MG INTO THE SKIN SUBCUTANEOUSLY ONCE WEEKLY   enalapril 5 MG tablet Commonly known as:  VASOTEC Take 1 tablet (5 mg total) by mouth 2 (two) times daily.   fish oil-omega-3 fatty acids 1000 MG capsule Take 1 g by mouth daily.   gabapentin 300 MG capsule Commonly known as:  NEURONTIN Take 1 capsule by mouth at  bedtime What changed:    how much to take  how to take this  when to take  this   Insulin Glargine 100 UNIT/ML Solostar Pen Commonly known as:  LANTUS SOLOSTAR Inject 30 Units into the skin daily. Follow-up appt was due back in June must see provider for future refills   Insulin Pen Needle 31G X 6 MM Misc Use as needed to inject insulin in the skin.   LORazepam 0.5 MG tablet Commonly known as:  ATIVAN TAKE ONE TABLET BY MOUTH TWICE DAILY AS NEEDED FOR ANXIETY   magnesium oxide 400 (241.3 Mg) MG tablet Commonly known as:  MAG-OX Take 1 tablet (400 mg total) by mouth daily.   metFORMIN 500 MG tablet Commonly known as:  GLUCOPHAGE Take 1 tablet (500 mg total) by mouth 2 (two) times daily with a meal. What changed:    how much to take  additional instructions   potassium chloride 10 MEQ tablet Commonly known as:  K-DUR Take 1 tablet (10 mEq total) by mouth daily.   protein supplement shake Liqd Commonly known as:  PREMIER PROTEIN Take 325 mLs (11 oz total) by mouth 2 (two) times daily between meals.      LABORATORY STUDIES CBC    Component Value Date/Time   WBC 8.4 01/10/2018 0807   RBC 3.76 (L) 01/10/2018 0807   HGB 11.9 (L) 01/10/2018 0807   HCT 32.2 (L) 01/10/2018 0807   PLT 242  01/10/2018 0807   MCV 85.6 01/10/2018 0807   MCH 31.6 01/10/2018 0807   MCHC 37.0 (H) 01/10/2018 0807   RDW 13.5 01/10/2018 0807   LYMPHSABS 1.3 01/04/2018 1356   MONOABS 0.7 01/04/2018 1356   EOSABS 0.5 01/04/2018 1356   BASOSABS 0.0 01/04/2018 1356   CMP    Component Value Date/Time   NA 139 01/10/2018 0807   NA 135 (A) 02/25/2016   K 4.1 01/10/2018 0807   CL 107 01/10/2018 0807   CO2 22 01/10/2018 0807   GLUCOSE 147 (H) 01/10/2018 0807   BUN 7 01/10/2018 0807   BUN 18 02/25/2016   CREATININE 0.86 01/10/2018 0807   CALCIUM 9.3 01/10/2018 0807   PROT 6.1 (L) 01/05/2018 1910   ALBUMIN 3.4 (L) 01/05/2018 1910   AST 47 (H) 01/05/2018 1910   ALT 25 01/05/2018 1910   ALKPHOS 84 01/05/2018 1910   BILITOT 0.9 01/05/2018 1910   GFRNONAA >60 01/10/2018  0807   GFRAA >60 01/10/2018 0807   COAGS Lab Results  Component Value Date   INR 0.97 01/04/2018   INR 1.14 02/17/2016   Lipid Panel    Component Value Date/Time   CHOL 127 01/05/2018 1910   TRIG 91 01/05/2018 1910   HDL 52 01/05/2018 1910   CHOLHDL 2.4 01/05/2018 1910   VLDL 18 01/05/2018 1910   LDLCALC 57 01/05/2018 1910   HgbA1C  Lab Results  Component Value Date   HGBA1C 5.5 01/05/2018   Urinalysis    Component Value Date/Time   COLORURINE YELLOW 01/05/2018 0926   APPEARANCEUR CLEAR 01/05/2018 0926   LABSPEC 1.013 01/05/2018 0926   PHURINE 6.0 01/05/2018 0926   GLUCOSEU 150 (A) 01/05/2018 0926   HGBUR SMALL (A) 01/05/2018 0926   BILIRUBINUR NEGATIVE 01/05/2018 0926   BILIRUBINUR Neg 01/22/2014 1656   KETONESUR NEGATIVE 01/05/2018 0926   PROTEINUR NEGATIVE 01/05/2018 0926   UROBILINOGEN 0.2 01/22/2014 1656   NITRITE POSITIVE (A) 01/05/2018 0926   LEUKOCYTESUR SMALL (A) 01/05/2018 0926   Urine Drug Screen     Component Value Date/Time   LABOPIA NONE DETECTED 01/05/2018 0926   COCAINSCRNUR NONE DETECTED 01/05/2018 0926   LABBENZ POSITIVE (A) 01/05/2018 0926   AMPHETMU NONE DETECTED 01/05/2018 0926   THCU NONE DETECTED 01/05/2018 0926   LABBARB NONE DETECTED 01/05/2018 0926    Alcohol Level    Component Value Date/Time   ETH <10 01/04/2018 1356   SIGNIFICANT DIAGNOSTIC STUDIES CT Head Wo Contrast  01/08/2017 IMPRESSION: 1. Degenerating intraventricular hemorrhage without hydrocephalus.      Small volume new, possibly redistributed subarachnoid blood products. 2. Stable severe chronic small vessel ischemic disease and old LEFT thalamus lacunar infarct.   Ct Head Wo Contrast Result Date: 01/05/2018 IMPRESSION: Unchanged appearance of intraventricular blood within the occipital horns of both lateral ventricles. No new site of hemorrhage or new ventricular dilatation. Electronically Signed   By: Ulyses Jarred M.D.   On: 01/05/2018 05:15   Ct Head Wo  Contrast Result Date: 01/04/2018 IMPRESSION: 1. Suspected redistribution of intraventricular blood, now within both occipital horns of the lateral ventricles. 2. Unchanged size and configuration of the ventricles. 3. No new site of hemorrhage. Electronically Signed   By: Ulyses Jarred M.D.   On: 01/04/2018 22:13   Ct Head Wo Contrast Result Date: 01/04/2018 IMPRESSION: 1. New intraventricular hemorrhage layering in the occipital horn on the left. No hydrocephalus. 2. No visible acute gray matter infarct. 3. Severe white matter disease with volume loss. Remote lacunar  infarct in the left thalamus. Electronically Signed   By: Monte Fantasia M.D.   On: 01/04/2018 16:21   Ct Angio Head Lavetta Nielsen W Or Wo Contrast Result Date: 01/04/2018 IMPRESSION: No large or medium vessel occlusion. No significant carotid bifurcation disease. No stenosis on the left. Focal calcified plaque at the distal bulb on the right but without stenosis when compared to the more distal cervical ICA. 50% stenosis the left subclavian artery just beyond the origin. Peripheral calcification throughout both carotid siphon regions. Generalized narrowing throughout the siphon regions, with stenosis estimated in the 30-50% range. These results were communicated to Dr. Leonel Ramsay At 2:43 pmon 1/9/2019by text page via the [facility] messaging system. Electronically Signed   By: Nelson Chimes M.D.   On: 01/04/2018 14:45   Mr Brain Wo Contrast Result Date: 01/05/2018 IMPRESSION: Motion degradation on multiple sequences. 1. Stable intraventricular hemorrhage pooling in left greater than right occipital horns. Stable ventricle size. 2. No stroke, new acute hemorrhage, or significant mass effect identified. 3. Advanced chronic microvascular ischemic changes and parenchymal volume loss of the brain. Electronically Signed   By: Kristine Garbe M.D.   On: 01/05/2018 17:34   Ct Head Code Stroke Wo Contrast Result Date: 01/04/2018 IMPRESSION: 1. No  acute finding. Advanced chronic small-vessel ischemic changes throughout the hemispheric white matter. Old left thalamic infarction. 2. ASPECTS is 10. 3. These results were communicated to Dr. Leonel Ramsay At 2:12 pmon 1/9/2019by text page via the [facility] messaging system. Electronically Signed   By: Nelson Chimes M.D.   On: 01/04/2018 14:14   Echocardiogram:                                               Study Conclusions - Left ventricle: The cavity size was normal. There was mild concentric hypertrophy. Systolic function was vigorous. The estimated ejection fraction was in the range of 65% to 70%. Wall motion was normal; there were no regional wall motion abnormalities. Doppler parameters are consistent with abnormal left ventricular relaxation (grade 1 diastolic dysfunction). - Mitral valve: Mildly calcified annulus. Valve area by continuity equation (using LVOT flow): 1.79 cm^2. - Left atrium: The atrium was mildly dilated. - Pulmonary arteries: Systolic pressure was mildly increased. PA peak pressure: 34 mm Hg (S).  Impressions:  No cardiac source of embolism was identified, but cannot be ruled out on the basis of this examination.    EEG 01/04/18:                                                               EEG Abnormalities:1)possible asymmetric PDR, though I do not think this is definite by the study. 2)generalized irregular slow activity Clinical Interpretation: Thisis alimitedEEG is consistent with a generalized nonspecific cerebral dysfunction, with possible superimposed right hemispheric dysfunction.  There was no seizure or seizure predisposition recorded on this study   EEG 01/09/18:       Impression: This awake andasleepEEG is abnormal due to milddiffuse slowing of the waking background.There were no epileptiform discharges or electrographic seizures seen.   CXR  01/09/2018 IMPRESSION: Poor inspiratory portable exam. Central pulmonary  vascular prominence without pulmonary edema. No segmental  consolidation noted. Mild cardiomegaly. Aortic Atherosclerosis (ICD10-I70.0).    HISTORY OF PRESENT ILLNESS   &   HOSPITAL COURSE IMPRESSION: Sheila [name] is a 67 y.o. female with PMH of  2 previous strokes the most recent of which was a left thalamic infarct in February 2017 with a residual right foot drop, hyperlipidemia, diabetes mellitus, peripheral arterial disease, and arthritis brought to [facility] today from her nursing facility as a code stroke with reports of acute onset Left sided weakness. IV TPA was given at 1424 on 01/04/2018.  Per the nursing tech who witnessed the event.  The patient became suddenly agitated, put her right arm to her head and pushed back from the table that she was sitting at.  At that point, the tech realized she was only pushing with her right leg and therefore was "spinning in circles" as she was trying to push away.  She was not moving the left arm or leg therefore EMS was called.  En route, the left arm and leg weakness improved, however she was still suffering from a left facial droop, severe left hemineglect, left hemianopia, as well as severe confusion.  Given this picture, she was given IV TPA. Family was not present so was administered with emergency consent after consultation with ER physician. Following IV tPA, the patient remained quite agitated, and this plus her paucity of speech prompted a stat EEG.   BP spiked following severe agitation, ativan given to confirm BP, following this it remained elevated and CT was obtained showing IVH. Giving cryoprecipitate and tranexamic acid  Patient with high fever, mental status changes on arrival. Meningitis may need to be considered as well. hence ordered empiric antibiotics.  Patient had repeat CTH. Increased IVH with no hydro. SBP 120-130s on Cleviprex. Exam essentially unchanged from prior exam with exception of decrease in level of  arousal, but is able to open eyes to voice. Not following commands. Not agitated. D/W NSGY on call over phone - no neurosurgical intervention indicated at this time. Repeat CTH at 0400 hrs stable ICH  New intraventricular hemorrhage layering in the occipital horn on the left.  No stroke, new acute hemorrhage post TPA reversed with cryo and TXA no significant mass effect identified.  Suspected Etiology: Hemorrhage s/p IV tPA for stroke mimic Resultant Symptoms:  left facial droop, severe left hemineglect, left hemianopia, severe confusion. Now improved Stroke Risk Factors: diabetes mellitus, hyperlipidemia and hypertension Other Stroke Risk Factors: Advanced age, Obesity, Body mass index is 33.43 kg/m.  Hx stroke, PAD  ASSESSMENT 01/08/2018:  Patient still has partial global aphasia, not following commands. Repeat CT this am showed stable IVH. Language deficit etiology unclear, could be due to underlying dementia or seizure or DWI-negative stroke. Will repeat EEG and limited MRI. Passed swallow, encourage po intake. PT/OT recommend SNF.  ASSESSMENT 01/09/2018:                                            No change in Neuro exam. Continues to not follow any commands. Repeat EEG Negative Unable to get MRI due to patient uncooperative. Will attempt CT Head. Electrolyte replacement in progress. SLP re-evaluation in progress. Patient PO intake slight improved from yesterday. Awaiting answer from [facility] unit and bed availability. Attempted to call son, no answer.  Attending Note: Patient still has limited language output with simple  words, able to repeat, but not naming, not following commands.Repeat CT showed stable IVH. Language deficit etiology unclear, could be due to worsening underlying dementia or seizure or DWI-negative stroke. Repeat EEG showed diffuse slowing. Pt not able to tolerate limited MRI. Encourage po intake.  ASSESSMENT 01/10/2018: Neuro exam unchanged. Cooperative  with staff and examiner. Remains non-verbal. Nursing was able to assist with meals today with difficulties. B/P stable and remains afebrile. Electrolyte replacements in progress. Medically stable for transfer to SNF today  PLAN  01/10/2018: Continue Statin HOLD ASA until outpatient Neurology follow up  Ongoing aggressive stroke risk factor management Patient's family  will be counseled to be compliant withherantithrombotic medications Patient's family will be counseled on Lifestyle modifications including, Diet, Exercise, and Stress Follow up with [facility] in 6 weeks Close follow up with PCP, need to recheck labs to monitor electrolyte levels  HX OF STROKES: Remote lacunar infarct in the left thalamus.  MEDICAL ISSUES:  S/p TPA f/b TXA and cryo for acute IVH  Acute allergic reaction to IV contrast  Acute Encephalopathy Patient still has partial global aphasia, not following commands.  Language deficit etiology unclear  could be due to underlying dementia. No seizures noted and DWI-negative stroke.  Attempted MRI x 3, patient uncooperative Repeat CT showed stable IVH.  SEIZURES:  EEG x 2 negative for seizure, mild diffuse slowing  No seizure activity noted  Anemia Stable, PCP to monitor  Hypokalemia   4.1 today - supplement in progress.  PCP to monitor  Hypomagnesemia 1.5 today - supplement in progress PCP to monitor  Hypocalcemia 9.3 today -Replaced PCP to monitor  Fever: - Resolved  Blood and urine cultures on admission no growth - afebrile today - WBC 8.4 today   CXR no acute findings  Discontinued antibiotics over the weekend  Will continue to monitor closley  Skin Tear on Left upper Arm Wound care RN consulted and orders in progress  HYPERTENSION:   Blood pressures well controlled overnight  BP goal:120-140SBPrange  Long term BP goal normotensive.  Home Meds: NONE  PO Intake Nursing continues to report  poor PO intake - Dysphagia 1 thin liquids Protein milkshake added to diet Nursing to assist patient to eat all meals  Prealbumin 22  PCP to monitor  HYPERLIPIDEMIA: Labs(Brief)          Component Value Date/Time   CHOL 127 01/05/2018 1910   TRIG 91 01/05/2018 1910   HDL 52 01/05/2018 1910   CHOLHDL 2.4 01/05/2018 1910   VLDL 18 01/05/2018 1910   LDLCALC 57 01/05/2018 1910      Home Meds:  Lipitor 40  LDL  goal < 70  Restarted on Lipitor to 20 mg daily  Continue statin at discharge  DIABETES: RecentLabs        Lab Results   Component  Value  Date     HGBA1C  5.5  01/05/2018     HgbA1c goal < 7.0  Currently on: Novolog  Continue CBG monitoring and SSI to maintain glucose 140-180 mg/dl  DM education   TOBACCO ABUSE &POLYSUBSTANCE ABUSE  UDS+ benzodiazapines  Current smoker  Smoking cessation counseling provided  Nicotine patch provided  OBESITY  Obesity, Body mass index is 33.43 kg/m. Greater than/equal to 30  DISCHARGE EXAM Blood pressure (!) 131/50, pulse 83, temperature 98.6 F (37 C), temperature source Oral, resp. rate 18, height 5\' 3"  (1.6 m), weight 85.6 kg (188 lb 11.4 oz), SpO2 96 %. General - Well nourished,  well developed, in no apparent distress HEENT-  Normocephalic,   Cardiovascular - Regular rate and rhythm  Respiratory - Lungs clear bilaterally. No wheezing. Abdomen - soft and non-tender, BS normal Extremities- no edema or cyanosis Neuro Exam : Mental Status: Patient is awake alert, appears to have partial global aphasia, not able to follow simple commands or answer questions, seems to have word finding difficulty, however, has intermittent spontaneous speech. Able to repeat but not naming.  Cranial Nerves: II: blinking to visual threat bilaterally. PERRL  III,IV, VI: no gaze preference  V: VII: blinks to eyelid stimulaiton bilaterally.  XII: tongue is midline without atrophy or fasciculations.   Motor: She moves all extremities with good strength, no focal weakness  Sensory: No deficits  Cerebellar: Does not perform, but no apparent ataxia.   Discharge Diet   DIET - DYS 1 Room service appropriate? Yes; Fluid consistency: Thin liquids  DISCHARGE PLAN Disposition:  [place]  No antithrombotic for secondary stroke prevention. Will address at Neurology follow up  Ongoing risk factor control by Primary Care Physician at time of discharge  Follow-up [facility], FNP in 2 weeks.  Follow-up with Dr. Antony Contras, Stroke Clinic in 6 weeks, office to schedule an appointment.  Greater than 30 minutes were spent preparing discharge.  Mary Sella, ANP-C Neurology Stroke Team 01/10/2018, 3:24 PM    ATTENDING NOTE: I reviewed above note and agree with the assessment and plan. I have made any additions or clarifications directly to the above note. Pt was seen and examined.   67 year old female with history of stroke, obesity, smoker, hypertension, hyperlipidemia, diabetes, PAD, SNF resident admitted for strokelike symptoms. Status post TPA. Repeat CT showed bilateral IVH. MRI showed no acute stroke but bilateral IVH stable. EF 65-70%, LDL 57 and A1c 5.5. EEG no seizure. IVH likely related to TPA use. Currently blood pressure under control.  Patient still has limited language output with simple words, able to repeat, but not naming, not following commands.Repeat CT showed stable IVH. Language deficit likely due to worsening underlying dementia. Repeat EEG showed diffuse slowing, no seizure. Pt not able to tolerate limited MRI. Encourage po intake. PT/OT recommend SNF. d/c back to SNF.  Rosalin Hawking, MD PhD Stroke Neurology 01/10/2018 4:30 PM

## 2018-01-10 NOTE — Evaluation (Signed)
Physical Therapy Evaluation Patient Details Name: Sheila [name] MRN: [id number] DOB: 03/31/1951 Today's Date: 01/10/2018   History of Present Illness  Pt admitted with AMS, CT scan initially negative, TPA given with hemorrhagic transformation. Second CT confirmed Bil IVH. Pt with h/o CVA and w/c bound at baseline, DM, arthritis.  Clinical Impression  Patient presents with decreased mobility as per staff report.  Evidently at [facility] was +1 assist and now is unable to stand with +2 A safely.  She is limited due to cognitive changes, apraxia, decreased balance, decreased R side use, and poor endurance.  Multiple attempts to stand with different methods, but pt more resistant when attempting unfamiliar equipment or with more assistance.  Feel she may benefit from trial of skilled PT at SNF level of care to see if engaging family and/or more familiar methods may improve her participation and mobility out of the bed.  PT to defer to next venue for more consistency and hopeful to be able to schedule family participation.      Follow Up Recommendations SNF;Supervision/Assistance - 24 hour    Equipment Recommendations  None recommended by PT    Recommendations for Other Services       Precautions / Restrictions Precautions Precautions: Fall      Mobility  Bed Mobility Overal bed mobility: Needs Assistance Bed Mobility: Supine to Sit;Sit to Supine     Supine to sit: HOB elevated;Max assist Sit to supine: Total assist;+2 for physical assistance   General bed mobility comments: assist to initiate to bring legs off bed, pt stiff in extension with bed soiled with urine; assist to lift trunk and scoot to EOB; pt assisting some, but needed help with all aspects; finally returned to supine after standing attempts unsuccessful and assist to change linens.  Transfers Overall transfer level: Needs assistance Equipment used: Rolling walker (2 wheeled);2 person hand held assist Transfers:  Sit to/from Stand Sit to Stand: Max assist;+2 physical assistance         General transfer comment: attempted several times with +1 and walker, then +2 and walker, then with Stedy, but pt increasingly resistant leaning posteriorly and extending legs despite cues and assist to get legs under her and elevating height of bed; first attempt, pt stood with +1 total A, but could not remain standing due to leg and trunk extension and pushing posteriorly.   Ambulation/Gait                Stairs            Wheelchair Mobility    Modified Rankin (Stroke Patients Only) Modified Rankin (Stroke Patients Only) Pre-Morbid Rankin Score: Moderately severe disability Modified Rankin: Severe disability     Balance Overall balance assessment: Needs assistance   Sitting balance-Leahy Scale: Poor Sitting balance - Comments: at least one UE support in sitting with S to min A for safety     Standing balance-Leahy Scale: Zero Standing balance comment: unable to stand                              Pertinent Vitals/Pain Faces Pain Scale: No hurt    Home Living Family/patient expects to be discharged to:: Skilled nursing facility                      Prior Function Level of Independence: Needs assistance   Gait / Transfers Assistance Needed: according to staff son states  she was at +1 assist level           Hand Dominance        Extremity/Trunk Assessment   Upper Extremity Assessment Upper Extremity Assessment: RUE deficits/detail;LUE deficits/detail RUE Deficits / Details: keeps R hand behind her and trunk rotated R in sitting, but able to move arm when assisted LUE Deficits / Details: AROM grossly WFL, strength NT due to cognition    Lower Extremity Assessment Lower Extremity Assessment: RLE deficits/detail;LLE deficits/detail RLE Deficits / Details: AROM WFL, strength at least 3-/5 LLE Deficits / Details: AROM WFL, strength at least 3-/5        Communication   Communication: Receptive difficulties;Expressive difficulties(minimal verbalizations, limited response to commands)  Cognition Arousal/Alertness: Awake/alert Behavior During Therapy: Flat affect Overall Cognitive Status: No family/caregiver present to determine baseline cognitive functioning Area of Impairment: Attention;Following commands;Problem solving                   Current Attention Level: Focused   Following Commands: Follows one step commands inconsistently     Problem Solving: Slow processing;Decreased initiation;Difficulty sequencing;Requires verbal cues        General Comments General comments (skin integrity, edema, etc.): Patient soiled with urine in bed, sitting EOB assist to change gown and wash lower body; attempted to engage pt to wash upper body with washcloth in her hand, but she did not initate despite several cues and demonstration    Exercises     Assessment/Plan    PT Assessment All further PT needs can be met in the next venue of care  PT Problem List Decreased strength;Decreased cognition;Decreased balance;Decreased safety awareness;Decreased mobility;Decreased coordination       PT Treatment Interventions      PT Goals (Current goals can be found in the Care Plan section)  Acute Rehab PT Goals Patient Stated Goal: unable to state PT Goal Formulation: All assessment and education complete, DC therapy    Frequency     Barriers to discharge        Co-evaluation               AM-PAC PT "6 Clicks" Daily Activity  Outcome Measure Difficulty turning over in bed (including adjusting bedclothes, sheets and blankets)?: Unable Difficulty moving from lying on back to sitting on the side of the bed? : Unable Difficulty sitting down on and standing up from a chair with arms (e.g., wheelchair, bedside commode, etc,.)?: Unable Help needed moving to and from a bed to chair (including a wheelchair)?: Total Help needed walking  in hospital room?: Total Help needed climbing 3-5 steps with a railing? : Total 6 Click Score: 6    End of Session Equipment Utilized During Treatment: Gait belt Activity Tolerance: Patient limited by fatigue Patient left: in bed;with call bell/phone within reach;with bed alarm set Nurse Communication: Mobility status;Need for lift equipment PT Visit Diagnosis: Apraxia (R48.2);Muscle weakness (generalized) (M62.81)    Time: 6948-5462 PT Time Calculation (min) (ACUTE ONLY): 44 min   Charges:   PT Evaluation $PT Eval Moderate Complexity: 1 Mod PT Treatments $Therapeutic Activity: 23-37 mins   PT G CodesMagda Kiel, [place] [phone] 01/10/2018   Reginia Naas 01/10/2018, 4:10 PM

## 2018-01-11 ENCOUNTER — Telehealth: Payer: Self-pay | Admitting: *Deleted

## 2018-01-11 NOTE — Telephone Encounter (Signed)
Pt was on TCM list was admitted 01/04/18 from her nursing facility as a code stroke with reports of acute onset Left sided weakness. Patient had repeat CTH. Increased IVH with no hydro. SBP 120-130s on Cleviprex. Exam essentially unchanged from prior exam with exception of decrease in level of arousal, but is able to open eyes to voice. Pt has been d/c 01/10/18, and sent back to SNF...Johny Chess

## 2018-01-13 DIAGNOSIS — I10 Essential (primary) hypertension: Secondary | ICD-10-CM | POA: Diagnosis not present

## 2018-01-13 DIAGNOSIS — E1140 Type 2 diabetes mellitus with diabetic neuropathy, unspecified: Secondary | ICD-10-CM | POA: Diagnosis not present

## 2018-01-13 DIAGNOSIS — R1312 Dysphagia, oropharyngeal phase: Secondary | ICD-10-CM | POA: Diagnosis not present

## 2018-01-13 DIAGNOSIS — R4701 Aphasia: Secondary | ICD-10-CM | POA: Diagnosis not present

## 2018-01-16 DIAGNOSIS — Z9181 History of falling: Secondary | ICD-10-CM | POA: Diagnosis not present

## 2018-01-16 DIAGNOSIS — R5381 Other malaise: Secondary | ICD-10-CM | POA: Diagnosis not present

## 2018-01-16 DIAGNOSIS — W19XXXA Unspecified fall, initial encounter: Secondary | ICD-10-CM | POA: Diagnosis not present

## 2018-01-16 DIAGNOSIS — M6281 Muscle weakness (generalized): Secondary | ICD-10-CM | POA: Diagnosis not present

## 2018-01-17 DIAGNOSIS — R2689 Other abnormalities of gait and mobility: Secondary | ICD-10-CM | POA: Diagnosis not present

## 2018-01-17 DIAGNOSIS — M6281 Muscle weakness (generalized): Secondary | ICD-10-CM | POA: Diagnosis not present

## 2018-01-17 DIAGNOSIS — I69398 Other sequelae of cerebral infarction: Secondary | ICD-10-CM | POA: Diagnosis not present

## 2018-01-17 DIAGNOSIS — R4189 Other symptoms and signs involving cognitive functions and awareness: Secondary | ICD-10-CM | POA: Diagnosis not present

## 2018-01-24 DIAGNOSIS — I69398 Other sequelae of cerebral infarction: Secondary | ICD-10-CM | POA: Diagnosis not present

## 2018-01-24 DIAGNOSIS — R4189 Other symptoms and signs involving cognitive functions and awareness: Secondary | ICD-10-CM | POA: Diagnosis not present

## 2018-01-24 DIAGNOSIS — R2689 Other abnormalities of gait and mobility: Secondary | ICD-10-CM | POA: Diagnosis not present

## 2018-01-24 DIAGNOSIS — M6281 Muscle weakness (generalized): Secondary | ICD-10-CM | POA: Diagnosis not present

## 2018-01-26 DIAGNOSIS — L98499 Non-pressure chronic ulcer of skin of other sites with unspecified severity: Secondary | ICD-10-CM | POA: Diagnosis not present

## 2018-02-07 DIAGNOSIS — M6281 Muscle weakness (generalized): Secondary | ICD-10-CM | POA: Diagnosis not present

## 2018-02-07 DIAGNOSIS — F419 Anxiety disorder, unspecified: Secondary | ICD-10-CM | POA: Diagnosis not present

## 2018-02-07 DIAGNOSIS — R456 Violent behavior: Secondary | ICD-10-CM | POA: Diagnosis not present

## 2018-02-09 DIAGNOSIS — L98499 Non-pressure chronic ulcer of skin of other sites with unspecified severity: Secondary | ICD-10-CM | POA: Diagnosis not present

## 2018-02-10 DIAGNOSIS — M6281 Muscle weakness (generalized): Secondary | ICD-10-CM | POA: Diagnosis not present

## 2018-02-10 DIAGNOSIS — R410 Disorientation, unspecified: Secondary | ICD-10-CM | POA: Diagnosis not present

## 2018-02-10 DIAGNOSIS — R5382 Chronic fatigue, unspecified: Secondary | ICD-10-CM | POA: Diagnosis not present

## 2018-02-15 DIAGNOSIS — R799 Abnormal finding of blood chemistry, unspecified: Secondary | ICD-10-CM | POA: Diagnosis not present

## 2018-02-15 DIAGNOSIS — N390 Urinary tract infection, site not specified: Secondary | ICD-10-CM | POA: Diagnosis not present

## 2018-02-15 DIAGNOSIS — B9620 Unspecified Escherichia coli [E. coli] as the cause of diseases classified elsewhere: Secondary | ICD-10-CM | POA: Diagnosis not present

## 2018-02-16 DIAGNOSIS — R4189 Other symptoms and signs involving cognitive functions and awareness: Secondary | ICD-10-CM | POA: Diagnosis not present

## 2018-02-16 DIAGNOSIS — R2689 Other abnormalities of gait and mobility: Secondary | ICD-10-CM | POA: Diagnosis not present

## 2018-02-16 DIAGNOSIS — I69398 Other sequelae of cerebral infarction: Secondary | ICD-10-CM | POA: Diagnosis not present

## 2018-02-16 DIAGNOSIS — M6281 Muscle weakness (generalized): Secondary | ICD-10-CM | POA: Diagnosis not present

## 2018-02-23 DIAGNOSIS — M6281 Muscle weakness (generalized): Secondary | ICD-10-CM | POA: Diagnosis not present

## 2018-02-23 DIAGNOSIS — F419 Anxiety disorder, unspecified: Secondary | ICD-10-CM | POA: Diagnosis not present

## 2018-02-28 ENCOUNTER — Telehealth: Payer: Self-pay | Admitting: Family

## 2018-02-28 DIAGNOSIS — E7849 Other hyperlipidemia: Secondary | ICD-10-CM | POA: Diagnosis not present

## 2018-02-28 DIAGNOSIS — N390 Urinary tract infection, site not specified: Secondary | ICD-10-CM | POA: Diagnosis not present

## 2018-02-28 DIAGNOSIS — M6281 Muscle weakness (generalized): Secondary | ICD-10-CM | POA: Diagnosis not present

## 2018-02-28 DIAGNOSIS — R411 Anterograde amnesia: Secondary | ICD-10-CM | POA: Diagnosis not present

## 2018-02-28 DIAGNOSIS — I10 Essential (primary) hypertension: Secondary | ICD-10-CM | POA: Diagnosis not present

## 2018-02-28 DIAGNOSIS — E876 Hypokalemia: Secondary | ICD-10-CM | POA: Diagnosis not present

## 2018-02-28 DIAGNOSIS — E119 Type 2 diabetes mellitus without complications: Secondary | ICD-10-CM | POA: Diagnosis not present

## 2018-02-28 NOTE — Telephone Encounter (Signed)
Copied from [place] [phone]. Topic: Quick Communication - See Telephone Encounter >> Feb 28, 2018 12:08 PM Oneta Rack wrote: Osvaldo Human name: Levada Dy  Relation to pt: Home Health coordinator [facility]  Call back number: [phone]    Reason for call:  Patient being discharged from [facility] and being admitted to [facility] and they would like to know who the covering physician  would be, please advise  >> Feb 28, 2018 12:24 PM Oneta Rack wrote: Osvaldo Human name: Levada Dy  Relation to pt: Home Health coordinator [facility]  Call back number: [phone]    Reason for call:  Patient being discharged from [facility] and being admitted to [facility] and they would like to know who the covering physician  would be, please advise

## 2018-02-28 NOTE — Telephone Encounter (Signed)
Pt was previously a patient of Sheila [name]'s and has not re-established with anyone. Please advise.

## 2018-03-01 ENCOUNTER — Telehealth: Payer: Self-pay | Admitting: Family

## 2018-03-01 DIAGNOSIS — M21371 Foot drop, right foot: Secondary | ICD-10-CM | POA: Diagnosis not present

## 2018-03-01 DIAGNOSIS — I69291 Dysphagia following other nontraumatic intracranial hemorrhage: Secondary | ICD-10-CM | POA: Diagnosis not present

## 2018-03-01 DIAGNOSIS — Z794 Long term (current) use of insulin: Secondary | ICD-10-CM | POA: Diagnosis not present

## 2018-03-01 DIAGNOSIS — M6281 Muscle weakness (generalized): Secondary | ICD-10-CM | POA: Diagnosis not present

## 2018-03-01 DIAGNOSIS — I10 Essential (primary) hypertension: Secondary | ICD-10-CM | POA: Diagnosis not present

## 2018-03-01 DIAGNOSIS — E119 Type 2 diabetes mellitus without complications: Secondary | ICD-10-CM | POA: Diagnosis not present

## 2018-03-01 DIAGNOSIS — I69220 Aphasia following other nontraumatic intracranial hemorrhage: Secondary | ICD-10-CM | POA: Diagnosis not present

## 2018-03-01 DIAGNOSIS — F0390 Unspecified dementia without behavioral disturbance: Secondary | ICD-10-CM | POA: Diagnosis not present

## 2018-03-01 DIAGNOSIS — I69398 Other sequelae of cerebral infarction: Secondary | ICD-10-CM | POA: Diagnosis not present

## 2018-03-01 DIAGNOSIS — F411 Generalized anxiety disorder: Secondary | ICD-10-CM | POA: Diagnosis not present

## 2018-03-01 DIAGNOSIS — Z87440 Personal history of urinary (tract) infections: Secondary | ICD-10-CM | POA: Diagnosis not present

## 2018-03-01 NOTE — Telephone Encounter (Signed)
Copied from [place] [id number]. Topic: Inquiry >> Mar 01, 2018 11:53 AM Margot Ables wrote: Reason for CRM: Nursing was supposed to open [facility] today. Mariann Laster is requesting OT/PT eval for Monday or Tuesday next week 3/11 or 3/12. Please call to advise of PCP and VO.

## 2018-03-01 NOTE — Telephone Encounter (Signed)
Spoke with Mariann Laster, They are needing verbal orders for nursing care. The patient is at [facility]. I offered [place] and informed of the other offices and the location and time frame which they can get her in. I asked if they did have a onsite doctor and they do. She will follow up with patient and see if she wants to go with onsite or transfer her care to another PCP at one of the [place] offices.   Patient has not been seen in almost a year.   Lovena Le just an [place].

## 2018-03-01 NOTE — Telephone Encounter (Signed)
Pt needs to have an appointment with Ashleigh.

## 2018-03-02 DIAGNOSIS — I10 Essential (primary) hypertension: Secondary | ICD-10-CM | POA: Diagnosis not present

## 2018-03-02 DIAGNOSIS — M21371 Foot drop, right foot: Secondary | ICD-10-CM | POA: Diagnosis not present

## 2018-03-02 DIAGNOSIS — M6281 Muscle weakness (generalized): Secondary | ICD-10-CM | POA: Diagnosis not present

## 2018-03-02 DIAGNOSIS — I69398 Other sequelae of cerebral infarction: Secondary | ICD-10-CM | POA: Diagnosis not present

## 2018-03-02 DIAGNOSIS — F0390 Unspecified dementia without behavioral disturbance: Secondary | ICD-10-CM | POA: Diagnosis not present

## 2018-03-02 DIAGNOSIS — E119 Type 2 diabetes mellitus without complications: Secondary | ICD-10-CM | POA: Diagnosis not present

## 2018-03-02 NOTE — Telephone Encounter (Signed)
Handled in next phone note

## 2018-03-03 DIAGNOSIS — I10 Essential (primary) hypertension: Secondary | ICD-10-CM | POA: Diagnosis not present

## 2018-03-03 DIAGNOSIS — E782 Mixed hyperlipidemia: Secondary | ICD-10-CM | POA: Diagnosis not present

## 2018-03-03 DIAGNOSIS — I69398 Other sequelae of cerebral infarction: Secondary | ICD-10-CM | POA: Diagnosis not present

## 2018-03-03 DIAGNOSIS — E119 Type 2 diabetes mellitus without complications: Secondary | ICD-10-CM | POA: Diagnosis not present

## 2018-03-03 DIAGNOSIS — F411 Generalized anxiety disorder: Secondary | ICD-10-CM | POA: Diagnosis not present

## 2018-03-03 DIAGNOSIS — F0390 Unspecified dementia without behavioral disturbance: Secondary | ICD-10-CM | POA: Diagnosis not present

## 2018-03-03 DIAGNOSIS — E1140 Type 2 diabetes mellitus with diabetic neuropathy, unspecified: Secondary | ICD-10-CM | POA: Diagnosis not present

## 2018-03-03 DIAGNOSIS — M6281 Muscle weakness (generalized): Secondary | ICD-10-CM | POA: Diagnosis not present

## 2018-03-03 DIAGNOSIS — M21371 Foot drop, right foot: Secondary | ICD-10-CM | POA: Diagnosis not present

## 2018-03-03 DIAGNOSIS — E876 Hypokalemia: Secondary | ICD-10-CM | POA: Diagnosis not present

## 2018-03-03 DIAGNOSIS — R2681 Unsteadiness on feet: Secondary | ICD-10-CM | POA: Diagnosis not present

## 2018-03-06 DIAGNOSIS — I69398 Other sequelae of cerebral infarction: Secondary | ICD-10-CM | POA: Diagnosis not present

## 2018-03-06 DIAGNOSIS — I10 Essential (primary) hypertension: Secondary | ICD-10-CM | POA: Diagnosis not present

## 2018-03-06 DIAGNOSIS — E119 Type 2 diabetes mellitus without complications: Secondary | ICD-10-CM | POA: Diagnosis not present

## 2018-03-06 DIAGNOSIS — M6281 Muscle weakness (generalized): Secondary | ICD-10-CM | POA: Diagnosis not present

## 2018-03-06 DIAGNOSIS — M21371 Foot drop, right foot: Secondary | ICD-10-CM | POA: Diagnosis not present

## 2018-03-06 DIAGNOSIS — F0390 Unspecified dementia without behavioral disturbance: Secondary | ICD-10-CM | POA: Diagnosis not present

## 2018-03-07 DIAGNOSIS — I10 Essential (primary) hypertension: Secondary | ICD-10-CM | POA: Diagnosis not present

## 2018-03-07 DIAGNOSIS — F0390 Unspecified dementia without behavioral disturbance: Secondary | ICD-10-CM | POA: Diagnosis not present

## 2018-03-07 DIAGNOSIS — E119 Type 2 diabetes mellitus without complications: Secondary | ICD-10-CM | POA: Diagnosis not present

## 2018-03-07 DIAGNOSIS — M6281 Muscle weakness (generalized): Secondary | ICD-10-CM | POA: Diagnosis not present

## 2018-03-07 DIAGNOSIS — F411 Generalized anxiety disorder: Secondary | ICD-10-CM | POA: Diagnosis not present

## 2018-03-07 DIAGNOSIS — M21371 Foot drop, right foot: Secondary | ICD-10-CM | POA: Diagnosis not present

## 2018-03-07 DIAGNOSIS — I69398 Other sequelae of cerebral infarction: Secondary | ICD-10-CM | POA: Diagnosis not present

## 2018-03-07 DIAGNOSIS — F331 Major depressive disorder, recurrent, moderate: Secondary | ICD-10-CM | POA: Diagnosis not present

## 2018-03-08 ENCOUNTER — Encounter: Payer: Self-pay | Admitting: Diagnostic Neuroimaging

## 2018-03-08 ENCOUNTER — Ambulatory Visit: Payer: Medicare Other | Admitting: Diagnostic Neuroimaging

## 2018-03-08 DIAGNOSIS — I10 Essential (primary) hypertension: Secondary | ICD-10-CM | POA: Diagnosis not present

## 2018-03-08 DIAGNOSIS — M21371 Foot drop, right foot: Secondary | ICD-10-CM | POA: Diagnosis not present

## 2018-03-08 DIAGNOSIS — E119 Type 2 diabetes mellitus without complications: Secondary | ICD-10-CM | POA: Diagnosis not present

## 2018-03-08 DIAGNOSIS — F0390 Unspecified dementia without behavioral disturbance: Secondary | ICD-10-CM | POA: Diagnosis not present

## 2018-03-08 DIAGNOSIS — M6281 Muscle weakness (generalized): Secondary | ICD-10-CM | POA: Diagnosis not present

## 2018-03-08 DIAGNOSIS — I69398 Other sequelae of cerebral infarction: Secondary | ICD-10-CM | POA: Diagnosis not present

## 2018-03-10 DIAGNOSIS — M6281 Muscle weakness (generalized): Secondary | ICD-10-CM | POA: Diagnosis not present

## 2018-03-10 DIAGNOSIS — F0390 Unspecified dementia without behavioral disturbance: Secondary | ICD-10-CM | POA: Diagnosis not present

## 2018-03-10 DIAGNOSIS — E119 Type 2 diabetes mellitus without complications: Secondary | ICD-10-CM | POA: Diagnosis not present

## 2018-03-10 DIAGNOSIS — I10 Essential (primary) hypertension: Secondary | ICD-10-CM | POA: Diagnosis not present

## 2018-03-10 DIAGNOSIS — M21371 Foot drop, right foot: Secondary | ICD-10-CM | POA: Diagnosis not present

## 2018-03-10 DIAGNOSIS — I69398 Other sequelae of cerebral infarction: Secondary | ICD-10-CM | POA: Diagnosis not present

## 2018-03-13 DIAGNOSIS — I69398 Other sequelae of cerebral infarction: Secondary | ICD-10-CM | POA: Diagnosis not present

## 2018-03-13 DIAGNOSIS — E119 Type 2 diabetes mellitus without complications: Secondary | ICD-10-CM | POA: Diagnosis not present

## 2018-03-13 DIAGNOSIS — M21371 Foot drop, right foot: Secondary | ICD-10-CM | POA: Diagnosis not present

## 2018-03-13 DIAGNOSIS — I10 Essential (primary) hypertension: Secondary | ICD-10-CM | POA: Diagnosis not present

## 2018-03-13 DIAGNOSIS — F0390 Unspecified dementia without behavioral disturbance: Secondary | ICD-10-CM | POA: Diagnosis not present

## 2018-03-13 DIAGNOSIS — M6281 Muscle weakness (generalized): Secondary | ICD-10-CM | POA: Diagnosis not present

## 2018-03-14 DIAGNOSIS — F0390 Unspecified dementia without behavioral disturbance: Secondary | ICD-10-CM | POA: Diagnosis not present

## 2018-03-14 DIAGNOSIS — M21371 Foot drop, right foot: Secondary | ICD-10-CM | POA: Diagnosis not present

## 2018-03-14 DIAGNOSIS — I10 Essential (primary) hypertension: Secondary | ICD-10-CM | POA: Diagnosis not present

## 2018-03-14 DIAGNOSIS — M6281 Muscle weakness (generalized): Secondary | ICD-10-CM | POA: Diagnosis not present

## 2018-03-14 DIAGNOSIS — I69398 Other sequelae of cerebral infarction: Secondary | ICD-10-CM | POA: Diagnosis not present

## 2018-03-14 DIAGNOSIS — E119 Type 2 diabetes mellitus without complications: Secondary | ICD-10-CM | POA: Diagnosis not present

## 2018-03-15 ENCOUNTER — Encounter (HOSPITAL_COMMUNITY): Payer: Self-pay | Admitting: Emergency Medicine

## 2018-03-15 ENCOUNTER — Emergency Department (HOSPITAL_COMMUNITY): Payer: Medicare Other

## 2018-03-15 ENCOUNTER — Emergency Department (HOSPITAL_COMMUNITY)
Admission: EM | Admit: 2018-03-15 | Discharge: 2018-03-15 | Disposition: A | Discharge status: Skilled Nursing Facility | Payer: Medicare Other | Attending: Emergency Medicine | Admitting: Emergency Medicine

## 2018-03-15 DIAGNOSIS — Y92122 Bedroom in nursing home as the place of occurrence of the external cause: Secondary | ICD-10-CM | POA: Insufficient documentation

## 2018-03-15 DIAGNOSIS — M545 Low back pain, unspecified: Secondary | ICD-10-CM

## 2018-03-15 DIAGNOSIS — G8911 Acute pain due to trauma: Secondary | ICD-10-CM | POA: Diagnosis not present

## 2018-03-15 DIAGNOSIS — Z79899 Other long term (current) drug therapy: Secondary | ICD-10-CM | POA: Insufficient documentation

## 2018-03-15 DIAGNOSIS — Z87891 Personal history of nicotine dependence: Secondary | ICD-10-CM | POA: Diagnosis not present

## 2018-03-15 DIAGNOSIS — Z043 Encounter for examination and observation following other accident: Secondary | ICD-10-CM | POA: Diagnosis present

## 2018-03-15 DIAGNOSIS — E119 Type 2 diabetes mellitus without complications: Secondary | ICD-10-CM | POA: Diagnosis not present

## 2018-03-15 DIAGNOSIS — Z794 Long term (current) use of insulin: Secondary | ICD-10-CM | POA: Insufficient documentation

## 2018-03-15 DIAGNOSIS — M21371 Foot drop, right foot: Secondary | ICD-10-CM | POA: Diagnosis not present

## 2018-03-15 DIAGNOSIS — I10 Essential (primary) hypertension: Secondary | ICD-10-CM | POA: Diagnosis not present

## 2018-03-15 DIAGNOSIS — W19XXXA Unspecified fall, initial encounter: Secondary | ICD-10-CM

## 2018-03-15 DIAGNOSIS — W06XXXA Fall from bed, initial encounter: Secondary | ICD-10-CM | POA: Diagnosis not present

## 2018-03-15 DIAGNOSIS — M25519 Pain in unspecified shoulder: Secondary | ICD-10-CM | POA: Diagnosis not present

## 2018-03-15 DIAGNOSIS — F0390 Unspecified dementia without behavioral disturbance: Secondary | ICD-10-CM | POA: Diagnosis not present

## 2018-03-15 DIAGNOSIS — Y939 Activity, unspecified: Secondary | ICD-10-CM | POA: Diagnosis not present

## 2018-03-15 DIAGNOSIS — T148XXA Other injury of unspecified body region, initial encounter: Secondary | ICD-10-CM | POA: Diagnosis not present

## 2018-03-15 DIAGNOSIS — M6281 Muscle weakness (generalized): Secondary | ICD-10-CM | POA: Diagnosis not present

## 2018-03-15 DIAGNOSIS — R4182 Altered mental status, unspecified: Secondary | ICD-10-CM | POA: Diagnosis not present

## 2018-03-15 DIAGNOSIS — R531 Weakness: Secondary | ICD-10-CM | POA: Diagnosis not present

## 2018-03-15 DIAGNOSIS — I69398 Other sequelae of cerebral infarction: Secondary | ICD-10-CM | POA: Diagnosis not present

## 2018-03-15 LAB — URINALYSIS, ROUTINE W REFLEX MICROSCOPIC
Bilirubin Urine: NEGATIVE
GLUCOSE, UA: NEGATIVE mg/dL
HGB URINE DIPSTICK: NEGATIVE
Ketones, ur: NEGATIVE mg/dL
Leukocytes, UA: NEGATIVE
Nitrite: NEGATIVE
Protein, ur: NEGATIVE mg/dL
SPECIFIC GRAVITY, URINE: 1.012 (ref 1.005–1.030)
pH: 6 (ref 5.0–8.0)

## 2018-03-15 LAB — CBC WITH DIFFERENTIAL/PLATELET
Basophils Absolute: 0 10*3/uL (ref 0.0–0.1)
Basophils Relative: 0 %
EOS ABS: 0.2 10*3/uL (ref 0.0–0.7)
EOS PCT: 4 %
HCT: 33.9 % — ABNORMAL LOW (ref 36.0–46.0)
Hemoglobin: 12 g/dL (ref 12.0–15.0)
LYMPHS ABS: 1.7 10*3/uL (ref 0.7–4.0)
Lymphocytes Relative: 32 %
MCH: 31.1 pg (ref 26.0–34.0)
MCHC: 35.4 g/dL (ref 30.0–36.0)
MCV: 87.8 fL (ref 78.0–100.0)
MONO ABS: 0.4 10*3/uL (ref 0.1–1.0)
Monocytes Relative: 8 %
Neutro Abs: 2.8 10*3/uL (ref 1.7–7.7)
Neutrophils Relative %: 56 %
PLATELETS: 223 10*3/uL (ref 150–400)
RBC: 3.86 MIL/uL — ABNORMAL LOW (ref 3.87–5.11)
RDW: 13.6 % (ref 11.5–15.5)
WBC: 5.1 10*3/uL (ref 4.0–10.5)

## 2018-03-15 LAB — BASIC METABOLIC PANEL
Anion gap: 10 (ref 5–15)
BUN: 13 mg/dL (ref 6–20)
CO2: 25 mmol/L (ref 22–32)
Calcium: 9.4 mg/dL (ref 8.9–10.3)
Chloride: 101 mmol/L (ref 101–111)
Creatinine, Ser: 1.18 mg/dL — ABNORMAL HIGH (ref 0.44–1.00)
GFR calc Af Amer: 54 mL/min — ABNORMAL LOW (ref >60)
GFR, EST NON AFRICAN AMERICAN: 47 mL/min — AB (ref >60)
GLUCOSE: 98 mg/dL (ref 65–99)
Potassium: 4.3 mmol/L (ref 3.5–5.1)
SODIUM: 136 mmol/L (ref 135–145)

## 2018-03-15 MED ORDER — ACETAMINOPHEN 500 MG PO TABS
1000.0000 mg | ORAL_TABLET | Freq: Once | ORAL | Status: AC
  Administered 2018-03-15: 1000 mg via ORAL
  Filled 2018-03-15: qty 2

## 2018-03-15 NOTE — ED Provider Notes (Signed)
[facility] DEPT Provider Note   CSN: [id number] Arrival date & time: 03/15/18  7035     History   Chief Complaint No chief complaint on file.   HPI Sheila [name] is a 67 y.o. female.  Chief complaint is fall.  HPI patient is a resident at [facility] after history of stroke.  She was getting out of bed this morning and fell.  She was from a seated position on her bed to the floor.  She was helped back into bed.  Complained of some back pain.  Was sent here as per facility protocol for evaluation.  Not anticoagulated.  Did not strike her head.  States he "just felt a little weak today".  Past Medical History:  Diagnosis Date  . Arthritis   . Diabetes mellitus    complicated by peripheral neuropathy  . Hyperlipidemia   . Stroke [facility]) 2015   residual right foot drop    Patient Active Problem List   Diagnosis Date Noted  . Intraventricular hemorrhage ([place]) 01/05/2018  . Hypertensive crisis   . Lethargic 05/05/2017  . Low blood sugar 05/05/2017  . Nail abnormalities 12/31/2016  . Memory changes 09/16/2016  . Multiple falls 09/16/2016  . UTI (urinary tract infection) 02/28/2016  . Depression 02/28/2016  . Acute ischemic VBA thalamic stroke ([place])   . Stroke (cerebrum) ([place])   . Anomia 02/17/2016  . Stroke ([place]) 02/17/2016  . Peripheral arterial disease ([place]) 12/03/2015  . Neuropathy 09/05/2015  . Medicare annual wellness visit, subsequent 03/12/2015  . Routine general medical examination at a health care facility 10/08/2014  . Cerumen impaction 10/08/2014  . Low back pain potentially associated with radiculopathy 03/01/2013  . Hyperlipidemia 02/15/2012  . Type 2 diabetes mellitus with neurologic complication ([place]) 00/93/8182    Past Surgical History:  Procedure Laterality Date  . CHOLECYSTECTOMY    . TONSILLECTOMY      OB History    No data available       Home Medications    Prior to Admission medications    Medication Sig Start Date End Date Taking? Authorizing Provider  acetaminophen (TYLENOL) 325 MG tablet Take 650 mg by mouth every 4 (four) hours as needed for mild pain, moderate pain or headache.   Yes [provider]  Amantadine HCl 100 MG tablet Take 50 mg by mouth 2 (two) times daily.   Yes [provider]  atorvastatin (LIPITOR) 20 MG tablet Take 1 tablet (20 mg total) by mouth daily at 6 PM. Patient taking differently: Take 20 mg by mouth daily.  01/10/18  Yes Costello, Mary A, NP  enalapril (VASOTEC) 5 MG tablet Take 1 tablet (5 mg total) by mouth 2 (two) times daily. 01/10/18  Yes Costello, Kayren Eaves, NP  fish oil-omega-3 fatty acids 1000 MG capsule Take 1 g by mouth daily.   Yes [provider]  gabapentin (NEURONTIN) 300 MG capsule Take 1 capsule by mouth at  bedtime Patient taking differently: Take 300mg  capsule by mouth at  bedtime 12/16/15  Yes Golden Circle, FNP  Insulin Glargine (LANTUS SOLOSTAR) 100 UNIT/ML Solostar Pen Inject 30 Units into the skin daily. Follow-up appt was due back in June must see provider for future refills 08/22/17  Yes Golden Circle, FNP  Insulin Pen Needle 31G X 6 MM MISC Use as needed to inject insulin in the skin. 06/24/17  Yes Golden Circle, FNP  LORazepam (ATIVAN) 1 MG tablet Take 1 mg  by mouth 2 (two) times daily.   Yes [provider]  magnesium oxide (MAG-OX) 400 (241.3 Mg) MG tablet Take 1 tablet (400 mg total) by mouth daily. 01/10/18  Yes Costello, Kayren Eaves, NP  metFORMIN (GLUCOPHAGE) 500 MG tablet Take 1 tablet (500 mg total) by mouth 2 (two) times daily with a meal. 06/13/17  Yes Golden Circle, FNP  Multiple Vitamin (DAILY-VITE) TABS Take 1 tablet by mouth every morning.   Yes [provider]  potassium chloride (K-DUR) 10 MEQ tablet Take 1 tablet (10 mEq total) by mouth daily. 01/10/18  Yes Costello, Kayren Eaves, NP  Dulaglutide (TRULICITY) 2.83 TD/1.7OH SOPN INJECT 0.75 MG INTO THE SKIN SUBCUTANEOUSLY  ONCE WEEKLY 06/02/17   Golden Circle, FNP  LORazepam (ATIVAN) 0.5 MG tablet TAKE ONE TABLET BY MOUTH TWICE DAILY AS NEEDED FOR ANXIETY Patient not taking: Reported on 03/15/2018 06/03/17   Golden Circle, FNP    Family History Family History  Problem Relation Age of Onset  . Diabetes Mother        Deceased  . Diabetes Brother   . Healthy Son     Social History Social History   Tobacco Use  . Smoking status: Former Smoker    Types: Cigarettes    Last attempt to quit: 02/15/1992    Years since quitting: 26.0  . Smokeless tobacco: Never Used  Substance Use Topics  . Alcohol use: No    Alcohol/week: 0.0 oz    Comment: Socially  . Drug use: No    Comment: SMOKES IT ANY TIME SHE CAN GET IT     Allergies   Sulfa antibiotics; Iopamidol; and Paxil [paroxetine hcl]   Review of Systems Review of Systems  Constitutional: Negative for appetite change, chills, diaphoresis, fatigue and fever.  HENT: Negative for mouth sores, sore throat and trouble swallowing.   Eyes: Negative for visual disturbance.  Respiratory: Negative for cough, chest tightness, shortness of breath and wheezing.   Cardiovascular: Negative for chest pain.  Gastrointestinal: Negative for abdominal distention, abdominal pain, diarrhea, nausea and vomiting.  Endocrine: Negative for polydipsia, polyphagia and polyuria.  Genitourinary: Negative for dysuria, frequency and hematuria.  Musculoskeletal: Positive for back pain. Negative for gait problem.  Skin: Negative for color change, pallor and rash.  Neurological: Positive for weakness. Negative for dizziness, syncope, light-headedness and headaches.  Hematological: Does not bruise/bleed easily.  Psychiatric/Behavioral: Negative for behavioral problems and confusion.     Physical Exam Updated Vital Signs BP 110/62   Pulse 82   Temp (!) 97.4 F (36.3 C) (Oral)   Resp 16   Ht 5\' 2"  (1.575 m)   Wt 74.8 kg (165 lb)   SpO2 100%   BMI 30.18 kg/m    Physical Exam  Constitutional: She is oriented to person, place, and time. She appears well-developed and well-nourished. No distress.  HENT:  Head: Normocephalic.  Eyes: Conjunctivae are normal. Pupils are equal, round, and reactive to light. No scleral icterus.  Neck: Normal range of motion. Neck supple. No thyromegaly present.  Cardiovascular: Normal rate and regular rhythm. Exam reveals no gallop and no friction rub.  No murmur heard. Pulmonary/Chest: Effort normal and breath sounds normal. No respiratory distress. She has no wheezes. She has no rales.  Abdominal: Soft. Bowel sounds are normal. She exhibits no distension. There is no tenderness. There is no rebound.  Musculoskeletal: Normal range of motion.  Neurological: She is alert and oriented to person, place, and time.  No focal weakness.  Normal neurological exam.  Mild dementia.  Will answer most questioning accurately  Skin: Skin is warm and dry. No rash noted.  Psychiatric: She has a normal mood and affect. Her behavior is normal.     ED Treatments / Results  Labs (all labs ordered are listed, but only abnormal results are displayed) Labs Reviewed  CBC WITH DIFFERENTIAL/PLATELET - Abnormal; Notable for the following components:      Result Value   RBC 3.86 (*)    HCT 33.9 (*)    All other components within normal limits  BASIC METABOLIC PANEL - Abnormal; Notable for the following components:   Creatinine, Ser 1.18 (*)    GFR calc non Af Amer 47 (*)    GFR calc Af Amer 54 (*)    All other components within normal limits  URINALYSIS, ROUTINE W REFLEX MICROSCOPIC    EKG  EKG Interpretation None       Radiology Dg Lumbar Spine Complete  Result Date: 03/15/2018 CLINICAL DATA:  Right lower back pain secondary to a fall. EXAM: LUMBAR SPINE - COMPLETE 4+ VIEW COMPARISON:  Radiographs dated 09/08/2016 and lumbar MRI dated 02/08/2015 FINDINGS: There is no fracture. Alignment is normal. Chronic degenerative disc  disease at L1-2, unchanged. Chronic severe bilateral facet arthritis at L4-5. IMPRESSION: No acute abnormalities. Chronic severe facet arthritis at L4-5. Chronic moderately severe degenerative disc disease at L1-2. Electronically Signed   By: Lorriane Shire M.D.   On: 03/15/2018 10:35    Procedures Procedures (including critical care time)  Medications Ordered in ED Medications  acetaminophen (TYLENOL) tablet 1,000 mg (1,000 mg Oral Given 03/15/18 0943)     Initial Impression / Assessment and Plan / ED Course  I have reviewed the triage vital signs and the nursing notes.  Pertinent labs & imaging results that were available during my care of the patient were reviewed by me and considered in my medical decision making (see chart for details).    Labs, including UA showed no acute abnormalities.  X-rays show no acute fractures or gross bony abnormalities.  Plan home/back to [place] facility.  Tylenol for pain.  Final Clinical Impressions(s) / ED Diagnoses   Final diagnoses:  Fall, initial encounter  Midline low back pain without sciatica, unspecified chronicity    ED Discharge Orders    None       Tanna Furry, MD 03/15/18 1151

## 2018-03-15 NOTE — ED Notes (Signed)
[facility] called for transport.  

## 2018-03-15 NOTE — ED Triage Notes (Signed)
Pt from [facility] following an unwitnessed fall. Pt is not on blood thinners. Pt was sitting on the edge of bed and reached to pick something up and fell. Pt initially had back pain but denies currently. Pt has hx of dementia but is at baseline.

## 2018-03-15 NOTE — ED Notes (Signed)
Called report to [place] at [facility]

## 2018-03-15 NOTE — ED Notes (Signed)
Bed: [facility] Expected date:  Expected time:  Means of arrival:  Comments: EMS 67 yo female from SNF-fall

## 2018-03-15 NOTE — ED Notes (Signed)
Bed: [facility] Expected date:  Expected time:  Means of arrival:  Comments: [facility]  

## 2018-03-15 NOTE — ED Notes (Signed)
Pt unable to stand 

## 2018-03-15 NOTE — Discharge Instructions (Signed)
No abnormalities of your back were found on your x-rays today.  You may feel some discomfort related to your fall for the next several days.  Take Tylenol as needed.

## 2018-03-16 DIAGNOSIS — F5105 Insomnia due to other mental disorder: Secondary | ICD-10-CM | POA: Diagnosis not present

## 2018-03-16 DIAGNOSIS — F0390 Unspecified dementia without behavioral disturbance: Secondary | ICD-10-CM | POA: Diagnosis not present

## 2018-03-16 DIAGNOSIS — F0150 Vascular dementia without behavioral disturbance: Secondary | ICD-10-CM | POA: Diagnosis not present

## 2018-03-16 DIAGNOSIS — M21371 Foot drop, right foot: Secondary | ICD-10-CM | POA: Diagnosis not present

## 2018-03-16 DIAGNOSIS — I10 Essential (primary) hypertension: Secondary | ICD-10-CM | POA: Diagnosis not present

## 2018-03-16 DIAGNOSIS — I69398 Other sequelae of cerebral infarction: Secondary | ICD-10-CM | POA: Diagnosis not present

## 2018-03-16 DIAGNOSIS — E119 Type 2 diabetes mellitus without complications: Secondary | ICD-10-CM | POA: Diagnosis not present

## 2018-03-16 DIAGNOSIS — M6281 Muscle weakness (generalized): Secondary | ICD-10-CM | POA: Diagnosis not present

## 2018-03-16 DIAGNOSIS — F419 Anxiety disorder, unspecified: Secondary | ICD-10-CM | POA: Diagnosis not present

## 2018-03-17 DIAGNOSIS — M25511 Pain in right shoulder: Secondary | ICD-10-CM | POA: Diagnosis not present

## 2018-03-17 DIAGNOSIS — I10 Essential (primary) hypertension: Secondary | ICD-10-CM | POA: Diagnosis not present

## 2018-03-17 DIAGNOSIS — R2681 Unsteadiness on feet: Secondary | ICD-10-CM | POA: Diagnosis not present

## 2018-03-17 DIAGNOSIS — R296 Repeated falls: Secondary | ICD-10-CM | POA: Diagnosis not present

## 2018-03-17 DIAGNOSIS — I69398 Other sequelae of cerebral infarction: Secondary | ICD-10-CM | POA: Diagnosis not present

## 2018-03-17 DIAGNOSIS — M6281 Muscle weakness (generalized): Secondary | ICD-10-CM | POA: Diagnosis not present

## 2018-03-17 DIAGNOSIS — E119 Type 2 diabetes mellitus without complications: Secondary | ICD-10-CM | POA: Diagnosis not present

## 2018-03-17 DIAGNOSIS — M21371 Foot drop, right foot: Secondary | ICD-10-CM | POA: Diagnosis not present

## 2018-03-17 DIAGNOSIS — F0390 Unspecified dementia without behavioral disturbance: Secondary | ICD-10-CM | POA: Diagnosis not present

## 2018-03-19 ENCOUNTER — Encounter (HOSPITAL_COMMUNITY): Payer: Self-pay | Admitting: *Deleted

## 2018-03-19 ENCOUNTER — Other Ambulatory Visit: Payer: Self-pay

## 2018-03-19 ENCOUNTER — Emergency Department (HOSPITAL_COMMUNITY)
Admission: EM | Admit: 2018-03-19 | Discharge: 2018-03-19 | Disposition: A | Discharge status: Home / Self Care | Payer: Medicare Other | Attending: Emergency Medicine | Admitting: Emergency Medicine

## 2018-03-19 DIAGNOSIS — R531 Weakness: Secondary | ICD-10-CM | POA: Diagnosis not present

## 2018-03-19 DIAGNOSIS — Z87891 Personal history of nicotine dependence: Secondary | ICD-10-CM | POA: Insufficient documentation

## 2018-03-19 DIAGNOSIS — E119 Type 2 diabetes mellitus without complications: Secondary | ICD-10-CM | POA: Diagnosis not present

## 2018-03-19 DIAGNOSIS — R4182 Altered mental status, unspecified: Secondary | ICD-10-CM | POA: Diagnosis not present

## 2018-03-19 DIAGNOSIS — Z79899 Other long term (current) drug therapy: Secondary | ICD-10-CM | POA: Insufficient documentation

## 2018-03-19 DIAGNOSIS — Z794 Long term (current) use of insulin: Secondary | ICD-10-CM | POA: Diagnosis not present

## 2018-03-19 DIAGNOSIS — Z8673 Personal history of transient ischemic attack (TIA), and cerebral infarction without residual deficits: Secondary | ICD-10-CM | POA: Diagnosis not present

## 2018-03-19 DIAGNOSIS — Z9049 Acquired absence of other specified parts of digestive tract: Secondary | ICD-10-CM | POA: Diagnosis not present

## 2018-03-19 DIAGNOSIS — R52 Pain, unspecified: Secondary | ICD-10-CM | POA: Diagnosis not present

## 2018-03-19 DIAGNOSIS — G8911 Acute pain due to trauma: Secondary | ICD-10-CM | POA: Diagnosis not present

## 2018-03-19 HISTORY — DX: Aphasia: R47.01

## 2018-03-19 HISTORY — DX: Muscle weakness (generalized): M62.81

## 2018-03-19 HISTORY — DX: Anxiety disorder, unspecified: F41.9

## 2018-03-19 HISTORY — DX: Dysphagia following nontraumatic intracerebral hemorrhage: I69.191

## 2018-03-19 LAB — URINALYSIS, ROUTINE W REFLEX MICROSCOPIC
BILIRUBIN URINE: NEGATIVE
Bacteria, UA: NONE SEEN
GLUCOSE, UA: NEGATIVE mg/dL
Hgb urine dipstick: NEGATIVE
KETONES UR: NEGATIVE mg/dL
Leukocytes, UA: NEGATIVE
NITRITE: NEGATIVE
PROTEIN: NEGATIVE mg/dL
Specific Gravity, Urine: 1.011 (ref 1.005–1.030)
Squamous Epithelial / LPF: NONE SEEN
pH: 6 (ref 5.0–8.0)

## 2018-03-19 LAB — CBC WITH DIFFERENTIAL/PLATELET
BASOS PCT: 0 %
Basophils Absolute: 0 10*3/uL (ref 0.0–0.1)
EOS ABS: 0.2 10*3/uL (ref 0.0–0.7)
Eosinophils Relative: 3 %
HEMATOCRIT: 37.8 % (ref 36.0–46.0)
HEMOGLOBIN: 13.1 g/dL (ref 12.0–15.0)
LYMPHS ABS: 2 10*3/uL (ref 0.7–4.0)
Lymphocytes Relative: 30 %
MCH: 30.5 pg (ref 26.0–34.0)
MCHC: 34.7 g/dL (ref 30.0–36.0)
MCV: 87.9 fL (ref 78.0–100.0)
MONOS PCT: 6 %
Monocytes Absolute: 0.4 10*3/uL (ref 0.1–1.0)
Neutro Abs: 4.1 10*3/uL (ref 1.7–7.7)
Neutrophils Relative %: 61 %
Platelets: 285 10*3/uL (ref 150–400)
RBC: 4.3 MIL/uL (ref 3.87–5.11)
RDW: 13.8 % (ref 11.5–15.5)
WBC: 6.7 10*3/uL (ref 4.0–10.5)

## 2018-03-19 LAB — COMPREHENSIVE METABOLIC PANEL
ALBUMIN: 3.7 g/dL (ref 3.5–5.0)
ALT: 16 U/L (ref 14–54)
AST: 21 U/L (ref 15–41)
Alkaline Phosphatase: 95 U/L (ref 38–126)
Anion gap: 10 (ref 5–15)
BILIRUBIN TOTAL: 0.5 mg/dL (ref 0.3–1.2)
BUN: 13 mg/dL (ref 6–20)
CALCIUM: 9.5 mg/dL (ref 8.9–10.3)
CO2: 26 mmol/L (ref 22–32)
CREATININE: 1.18 mg/dL — AB (ref 0.44–1.00)
Chloride: 101 mmol/L (ref 101–111)
GFR calc Af Amer: 54 mL/min — ABNORMAL LOW (ref >60)
GFR calc non Af Amer: 47 mL/min — ABNORMAL LOW (ref >60)
GLUCOSE: 99 mg/dL (ref 65–99)
Potassium: 4.4 mmol/L (ref 3.5–5.1)
SODIUM: 137 mmol/L (ref 135–145)
TOTAL PROTEIN: 7.1 g/dL (ref 6.5–8.1)

## 2018-03-19 NOTE — ED Provider Notes (Signed)
[facility] DEPT Provider Note   CSN: [id number] Arrival date & time: 03/19/18  1954     History   Chief Complaint Chief Complaint  Patient presents with  . Fall    HPI Sheila [name] is a 67 y.o. female.  67 year old female presents from nursing home after sliding down to the floor when going from a wheelchair to her bed.  States that she has had increased weakness recently.  Does have a baseline history of weakness.  Denies any recent fever, vomiting, diarrhea.  No resultant injury from the fall today which was witnessed by her boyfriend.  EMS called and patient transported for further management.     Past Medical History:  Diagnosis Date  . Arthritis   . Diabetes mellitus    complicated by peripheral neuropathy  . Hyperlipidemia   . Stroke [facility]) 2015   residual right foot drop    Patient Active Problem List   Diagnosis Date Noted  . Intraventricular hemorrhage ([place]) 01/05/2018  . Hypertensive crisis   . Lethargic 05/05/2017  . Low blood sugar 05/05/2017  . Nail abnormalities 12/31/2016  . Memory changes 09/16/2016  . Multiple falls 09/16/2016  . UTI (urinary tract infection) 02/28/2016  . Depression 02/28/2016  . Acute ischemic VBA thalamic stroke ([place])   . Stroke (cerebrum) ([place])   . Anomia 02/17/2016  . Stroke ([place]) 02/17/2016  . Peripheral arterial disease ([place]) 12/03/2015  . Neuropathy 09/05/2015  . Medicare annual wellness visit, subsequent 03/12/2015  . Routine general medical examination at a health care facility 10/08/2014  . Cerumen impaction 10/08/2014  . Low back pain potentially associated with radiculopathy 03/01/2013  . Hyperlipidemia 02/15/2012  . Type 2 diabetes mellitus with neurologic complication ([place]) 67/11/4579    Past Surgical History:  Procedure Laterality Date  . CHOLECYSTECTOMY    . TONSILLECTOMY       OB History   None      Home Medications    Prior to Admission medications     Medication Sig Start Date End Date Taking? Authorizing Provider  acetaminophen (TYLENOL) 325 MG tablet Take 650 mg by mouth every 4 (four) hours as needed for mild pain, moderate pain or headache.    [provider]  Amantadine HCl 100 MG tablet Take 50 mg by mouth 2 (two) times daily.    [provider]  atorvastatin (LIPITOR) 20 MG tablet Take 1 tablet (20 mg total) by mouth daily at 6 PM. Patient taking differently: Take 20 mg by mouth daily.  01/10/18   Mary Sella, NP  Dulaglutide (TRULICITY) 9.98 PJ/8.2NK SOPN INJECT 0.75 MG INTO THE SKIN SUBCUTANEOUSLY ONCE WEEKLY 06/02/17   Golden Circle, FNP  enalapril (VASOTEC) 5 MG tablet Take 1 tablet (5 mg total) by mouth 2 (two) times daily. 01/10/18   Mary Sella, NP  fish oil-omega-3 fatty acids 1000 MG capsule Take 1 g by mouth daily.    [provider]  gabapentin (NEURONTIN) 300 MG capsule Take 1 capsule by mouth at  bedtime Patient taking differently: Take 300mg  capsule by mouth at  bedtime 12/16/15   Golden Circle, FNP  Insulin Glargine (LANTUS SOLOSTAR) 100 UNIT/ML Solostar Pen Inject 30 Units into the skin daily. Follow-up appt was due back in June must see provider for future refills 08/22/17   Golden Circle, FNP  Insulin Pen Needle 31G X 6 MM MISC Use as needed to inject insulin in the skin. 06/24/17   Calone,  Ples Specter, FNP  LORazepam (ATIVAN) 0.5 MG tablet TAKE ONE TABLET BY MOUTH TWICE DAILY AS NEEDED FOR ANXIETY Patient not taking: Reported on 03/15/2018 06/03/17   Golden Circle, FNP  LORazepam (ATIVAN) 1 MG tablet Take 1 mg by mouth 2 (two) times daily.    [provider]  magnesium oxide (MAG-OX) 400 (241.3 Mg) MG tablet Take 1 tablet (400 mg total) by mouth daily. 01/10/18   Mary Sella, NP  metFORMIN (GLUCOPHAGE) 500 MG tablet Take 1 tablet (500 mg total) by mouth 2 (two) times daily with a meal. 06/13/17   Golden Circle, FNP  Multiple Vitamin (DAILY-VITE) TABS Take 1  tablet by mouth every morning.    [provider]  potassium chloride (K-DUR) 10 MEQ tablet Take 1 tablet (10 mEq total) by mouth daily. 01/10/18   Mary Sella, NP    Family History Family History  Problem Relation Age of Onset  . Diabetes Mother        Deceased  . Diabetes Brother   . Healthy Son     Social History Social History   Tobacco Use  . Smoking status: Former Smoker    Types: Cigarettes    Last attempt to quit: 02/15/1992    Years since quitting: 26.1  . Smokeless tobacco: Never Used  Substance Use Topics  . Alcohol use: No    Alcohol/week: 0.0 oz    Comment: Socially  . Drug use: No    Types: Marijuana    Comment: SMOKES IT ANY TIME SHE CAN GET IT     Allergies   Sulfa antibiotics; Iopamidol; and Paxil [paroxetine hcl]   Review of Systems Review of Systems  All other systems reviewed and are negative.    Physical Exam Updated Vital Signs SpO2 97%   Physical Exam  Constitutional: She is oriented to person, place, and time. She appears well-developed and well-nourished.  Non-toxic appearance. No distress.  HENT:  Head: Normocephalic and atraumatic.  Eyes: Pupils are equal, round, and reactive to light. Conjunctivae, EOM and lids are normal.  Neck: Normal range of motion. Neck supple. No tracheal deviation present. No thyroid mass present.  Cardiovascular: Normal rate, regular rhythm and normal heart sounds. Exam reveals no gallop.  No murmur heard. Pulmonary/Chest: Effort normal and breath sounds normal. No stridor. No respiratory distress. She has no decreased breath sounds. She has no wheezes. She has no rhonchi. She has no rales.  Abdominal: Soft. Normal appearance and bowel sounds are normal. She exhibits no distension. There is no tenderness. There is no rebound and no CVA tenderness.  Musculoskeletal: Normal range of motion. She exhibits no edema or tenderness.  Neurological: She is alert and oriented to person, place, and time. She  displays no tremor. No cranial nerve deficit or sensory deficit. She exhibits normal muscle tone. GCS eye subscore is 4. GCS verbal subscore is 5. GCS motor subscore is 6.  3 out of 5 strength bilateral lower extremities.  Skin: Skin is warm and dry. No abrasion and no rash noted.  Psychiatric: Her speech is normal and behavior is normal. Her affect is blunt.  Nursing note and vitals reviewed.    ED Treatments / Results  Labs (all labs ordered are listed, but only abnormal results are displayed) Labs Reviewed  URINE CULTURE  URINALYSIS, ROUTINE W REFLEX MICROSCOPIC  CBC WITH DIFFERENTIAL/PLATELET  COMPREHENSIVE METABOLIC PANEL    EKG None  Radiology No results found.  Procedures Procedures (including critical care time)  Medications Ordered in ED Medications - No data to display   Initial Impression / Assessment and Plan / ED Course  I have reviewed the triage vital signs and the nursing notes.  Pertinent labs & imaging results that were available during my care of the patient were reviewed by me and considered in my medical decision making (see chart for details).     Patient's urinalysis and blood work are reassuring.  Stable for discharge.  No acute injuries needed imaging at this time.  Final Clinical Impressions(s) / ED Diagnoses   Final diagnoses:  None    ED Discharge Orders    None       Lacretia Leigh, MD 03/19/18 2208
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
## 2018-03-19 NOTE — ED Notes (Signed)
Bed: WA02 Expected date:  Expected time:  Means of arrival:  Comments: 40f fall

## 2018-03-19 NOTE — ED Notes (Signed)
[facility] called for transport.  

## 2018-03-20 DIAGNOSIS — I10 Essential (primary) hypertension: Secondary | ICD-10-CM | POA: Diagnosis not present

## 2018-03-20 DIAGNOSIS — F0390 Unspecified dementia without behavioral disturbance: Secondary | ICD-10-CM | POA: Diagnosis not present

## 2018-03-20 DIAGNOSIS — I69398 Other sequelae of cerebral infarction: Secondary | ICD-10-CM | POA: Diagnosis not present

## 2018-03-20 DIAGNOSIS — M6281 Muscle weakness (generalized): Secondary | ICD-10-CM | POA: Diagnosis not present

## 2018-03-20 DIAGNOSIS — M21371 Foot drop, right foot: Secondary | ICD-10-CM | POA: Diagnosis not present

## 2018-03-20 DIAGNOSIS — E119 Type 2 diabetes mellitus without complications: Secondary | ICD-10-CM | POA: Diagnosis not present

## 2018-03-21 DIAGNOSIS — I69398 Other sequelae of cerebral infarction: Secondary | ICD-10-CM | POA: Diagnosis not present

## 2018-03-21 DIAGNOSIS — M6281 Muscle weakness (generalized): Secondary | ICD-10-CM | POA: Diagnosis not present

## 2018-03-21 DIAGNOSIS — M21371 Foot drop, right foot: Secondary | ICD-10-CM | POA: Diagnosis not present

## 2018-03-21 DIAGNOSIS — I10 Essential (primary) hypertension: Secondary | ICD-10-CM | POA: Diagnosis not present

## 2018-03-21 DIAGNOSIS — F0390 Unspecified dementia without behavioral disturbance: Secondary | ICD-10-CM | POA: Diagnosis not present

## 2018-03-21 DIAGNOSIS — E119 Type 2 diabetes mellitus without complications: Secondary | ICD-10-CM | POA: Diagnosis not present

## 2018-03-22 DIAGNOSIS — F0390 Unspecified dementia without behavioral disturbance: Secondary | ICD-10-CM | POA: Diagnosis not present

## 2018-03-22 DIAGNOSIS — I10 Essential (primary) hypertension: Secondary | ICD-10-CM | POA: Diagnosis not present

## 2018-03-22 DIAGNOSIS — I69398 Other sequelae of cerebral infarction: Secondary | ICD-10-CM | POA: Diagnosis not present

## 2018-03-22 DIAGNOSIS — Z5181 Encounter for therapeutic drug level monitoring: Secondary | ICD-10-CM | POA: Diagnosis not present

## 2018-03-22 DIAGNOSIS — M21371 Foot drop, right foot: Secondary | ICD-10-CM | POA: Diagnosis not present

## 2018-03-22 DIAGNOSIS — E119 Type 2 diabetes mellitus without complications: Secondary | ICD-10-CM | POA: Diagnosis not present

## 2018-03-22 DIAGNOSIS — M6281 Muscle weakness (generalized): Secondary | ICD-10-CM | POA: Diagnosis not present

## 2018-03-22 LAB — URINE CULTURE: Culture: >=100000 — AB

## 2018-03-23 ENCOUNTER — Telehealth: Payer: Self-pay | Admitting: *Deleted

## 2018-03-23 DIAGNOSIS — F0150 Vascular dementia without behavioral disturbance: Secondary | ICD-10-CM | POA: Diagnosis not present

## 2018-03-23 NOTE — Telephone Encounter (Signed)
Post ED Visit - Positive Culture Follow-up  Culture report reviewed by antimicrobial stewardship pharmacist:  []  Elenor Quinones, Pharm.D. []  Heide Guile, Pharm.D., BCPS AQ-ID []  Parks Neptune, Pharm.D., BCPS [x]  Alycia Rossetti, Pharm.D., BCPS []  [place], [place].D., BCPS, AAHIVP []  Legrand Como, Pharm.D., BCPS, AAHIVP []  Salome Arnt, PharmD, BCPS []  Jalene Mullet, PharmD []  Vincenza Hews, PharmD, BCPS  Positive urine culture No treatment, colonized and no further patient follow-up is required at this time.  Harlon Flor [facility] 03/23/2018, 11:23 AM

## 2018-03-24 DIAGNOSIS — I10 Essential (primary) hypertension: Secondary | ICD-10-CM | POA: Diagnosis not present

## 2018-03-24 DIAGNOSIS — F0390 Unspecified dementia without behavioral disturbance: Secondary | ICD-10-CM | POA: Diagnosis not present

## 2018-03-24 DIAGNOSIS — M6281 Muscle weakness (generalized): Secondary | ICD-10-CM | POA: Diagnosis not present

## 2018-03-24 DIAGNOSIS — I69398 Other sequelae of cerebral infarction: Secondary | ICD-10-CM | POA: Diagnosis not present

## 2018-03-24 DIAGNOSIS — M21371 Foot drop, right foot: Secondary | ICD-10-CM | POA: Diagnosis not present

## 2018-03-24 DIAGNOSIS — E119 Type 2 diabetes mellitus without complications: Secondary | ICD-10-CM | POA: Diagnosis not present

## 2018-03-27 DIAGNOSIS — I69398 Other sequelae of cerebral infarction: Secondary | ICD-10-CM | POA: Diagnosis not present

## 2018-03-27 DIAGNOSIS — F0390 Unspecified dementia without behavioral disturbance: Secondary | ICD-10-CM | POA: Diagnosis not present

## 2018-03-27 DIAGNOSIS — E119 Type 2 diabetes mellitus without complications: Secondary | ICD-10-CM | POA: Diagnosis not present

## 2018-03-27 DIAGNOSIS — M6281 Muscle weakness (generalized): Secondary | ICD-10-CM | POA: Diagnosis not present

## 2018-03-27 DIAGNOSIS — M21371 Foot drop, right foot: Secondary | ICD-10-CM | POA: Diagnosis not present

## 2018-03-27 DIAGNOSIS — I10 Essential (primary) hypertension: Secondary | ICD-10-CM | POA: Diagnosis not present

## 2018-03-28 DIAGNOSIS — F0390 Unspecified dementia without behavioral disturbance: Secondary | ICD-10-CM | POA: Diagnosis not present

## 2018-03-28 DIAGNOSIS — I10 Essential (primary) hypertension: Secondary | ICD-10-CM | POA: Diagnosis not present

## 2018-03-28 DIAGNOSIS — M6281 Muscle weakness (generalized): Secondary | ICD-10-CM | POA: Diagnosis not present

## 2018-03-28 DIAGNOSIS — E119 Type 2 diabetes mellitus without complications: Secondary | ICD-10-CM | POA: Diagnosis not present

## 2018-03-28 DIAGNOSIS — M21371 Foot drop, right foot: Secondary | ICD-10-CM | POA: Diagnosis not present

## 2018-03-28 DIAGNOSIS — I69398 Other sequelae of cerebral infarction: Secondary | ICD-10-CM | POA: Diagnosis not present

## 2018-03-29 DIAGNOSIS — I10 Essential (primary) hypertension: Secondary | ICD-10-CM | POA: Diagnosis not present

## 2018-03-29 DIAGNOSIS — F0390 Unspecified dementia without behavioral disturbance: Secondary | ICD-10-CM | POA: Diagnosis not present

## 2018-03-29 DIAGNOSIS — M21371 Foot drop, right foot: Secondary | ICD-10-CM | POA: Diagnosis not present

## 2018-03-29 DIAGNOSIS — E119 Type 2 diabetes mellitus without complications: Secondary | ICD-10-CM | POA: Diagnosis not present

## 2018-03-29 DIAGNOSIS — I69398 Other sequelae of cerebral infarction: Secondary | ICD-10-CM | POA: Diagnosis not present

## 2018-03-29 DIAGNOSIS — M6281 Muscle weakness (generalized): Secondary | ICD-10-CM | POA: Diagnosis not present

## 2018-03-30 DIAGNOSIS — M6281 Muscle weakness (generalized): Secondary | ICD-10-CM | POA: Diagnosis not present

## 2018-03-30 DIAGNOSIS — F0390 Unspecified dementia without behavioral disturbance: Secondary | ICD-10-CM | POA: Diagnosis not present

## 2018-03-30 DIAGNOSIS — I10 Essential (primary) hypertension: Secondary | ICD-10-CM | POA: Diagnosis not present

## 2018-03-30 DIAGNOSIS — E119 Type 2 diabetes mellitus without complications: Secondary | ICD-10-CM | POA: Diagnosis not present

## 2018-03-30 DIAGNOSIS — M21371 Foot drop, right foot: Secondary | ICD-10-CM | POA: Diagnosis not present

## 2018-03-30 DIAGNOSIS — I69398 Other sequelae of cerebral infarction: Secondary | ICD-10-CM | POA: Diagnosis not present

## 2018-03-31 DIAGNOSIS — R296 Repeated falls: Secondary | ICD-10-CM | POA: Diagnosis not present

## 2018-03-31 DIAGNOSIS — M6281 Muscle weakness (generalized): Secondary | ICD-10-CM | POA: Diagnosis not present

## 2018-03-31 DIAGNOSIS — F419 Anxiety disorder, unspecified: Secondary | ICD-10-CM | POA: Diagnosis not present

## 2018-03-31 DIAGNOSIS — F5105 Insomnia due to other mental disorder: Secondary | ICD-10-CM | POA: Diagnosis not present

## 2018-03-31 DIAGNOSIS — F0150 Vascular dementia without behavioral disturbance: Secondary | ICD-10-CM | POA: Diagnosis not present

## 2018-04-03 DIAGNOSIS — M21371 Foot drop, right foot: Secondary | ICD-10-CM | POA: Diagnosis not present

## 2018-04-03 DIAGNOSIS — E119 Type 2 diabetes mellitus without complications: Secondary | ICD-10-CM | POA: Diagnosis not present

## 2018-04-03 DIAGNOSIS — M6281 Muscle weakness (generalized): Secondary | ICD-10-CM | POA: Diagnosis not present

## 2018-04-03 DIAGNOSIS — F0390 Unspecified dementia without behavioral disturbance: Secondary | ICD-10-CM | POA: Diagnosis not present

## 2018-04-03 DIAGNOSIS — I10 Essential (primary) hypertension: Secondary | ICD-10-CM | POA: Diagnosis not present

## 2018-04-03 DIAGNOSIS — I69398 Other sequelae of cerebral infarction: Secondary | ICD-10-CM | POA: Diagnosis not present

## 2018-04-04 DIAGNOSIS — M21371 Foot drop, right foot: Secondary | ICD-10-CM | POA: Diagnosis not present

## 2018-04-04 DIAGNOSIS — I10 Essential (primary) hypertension: Secondary | ICD-10-CM | POA: Diagnosis not present

## 2018-04-04 DIAGNOSIS — M6281 Muscle weakness (generalized): Secondary | ICD-10-CM | POA: Diagnosis not present

## 2018-04-04 DIAGNOSIS — E119 Type 2 diabetes mellitus without complications: Secondary | ICD-10-CM | POA: Diagnosis not present

## 2018-04-04 DIAGNOSIS — I69398 Other sequelae of cerebral infarction: Secondary | ICD-10-CM | POA: Diagnosis not present

## 2018-04-04 DIAGNOSIS — F0390 Unspecified dementia without behavioral disturbance: Secondary | ICD-10-CM | POA: Diagnosis not present

## 2018-04-07 DIAGNOSIS — I69398 Other sequelae of cerebral infarction: Secondary | ICD-10-CM | POA: Diagnosis not present

## 2018-04-07 DIAGNOSIS — I10 Essential (primary) hypertension: Secondary | ICD-10-CM | POA: Diagnosis not present

## 2018-04-07 DIAGNOSIS — M6281 Muscle weakness (generalized): Secondary | ICD-10-CM | POA: Diagnosis not present

## 2018-04-07 DIAGNOSIS — M21371 Foot drop, right foot: Secondary | ICD-10-CM | POA: Diagnosis not present

## 2018-04-07 DIAGNOSIS — R296 Repeated falls: Secondary | ICD-10-CM | POA: Diagnosis not present

## 2018-04-07 DIAGNOSIS — E119 Type 2 diabetes mellitus without complications: Secondary | ICD-10-CM | POA: Diagnosis not present

## 2018-04-07 DIAGNOSIS — E782 Mixed hyperlipidemia: Secondary | ICD-10-CM | POA: Diagnosis not present

## 2018-04-07 DIAGNOSIS — F0390 Unspecified dementia without behavioral disturbance: Secondary | ICD-10-CM | POA: Diagnosis not present

## 2018-04-10 DIAGNOSIS — F0390 Unspecified dementia without behavioral disturbance: Secondary | ICD-10-CM | POA: Diagnosis not present

## 2018-04-10 DIAGNOSIS — I69398 Other sequelae of cerebral infarction: Secondary | ICD-10-CM | POA: Diagnosis not present

## 2018-04-10 DIAGNOSIS — M21371 Foot drop, right foot: Secondary | ICD-10-CM | POA: Diagnosis not present

## 2018-04-10 DIAGNOSIS — M6281 Muscle weakness (generalized): Secondary | ICD-10-CM | POA: Diagnosis not present

## 2018-04-10 DIAGNOSIS — I10 Essential (primary) hypertension: Secondary | ICD-10-CM | POA: Diagnosis not present

## 2018-04-10 DIAGNOSIS — E119 Type 2 diabetes mellitus without complications: Secondary | ICD-10-CM | POA: Diagnosis not present

## 2018-04-11 ENCOUNTER — Other Ambulatory Visit: Payer: Self-pay

## 2018-04-11 NOTE — Patient Outreach (Signed)
Telephone outreach to patient to obtain mRS was successfully completed. mRS = 4 

## 2018-04-12 ENCOUNTER — Emergency Department (HOSPITAL_COMMUNITY)
Admission: EM | Admit: 2018-04-12 | Discharge: 2018-04-13 | Disposition: A | Discharge status: Skilled Nursing Facility | Payer: Medicare Other | Attending: Emergency Medicine | Admitting: Emergency Medicine

## 2018-04-12 ENCOUNTER — Other Ambulatory Visit: Payer: Self-pay

## 2018-04-12 ENCOUNTER — Emergency Department (HOSPITAL_COMMUNITY): Payer: Medicare Other

## 2018-04-12 ENCOUNTER — Encounter (HOSPITAL_COMMUNITY): Payer: Self-pay | Admitting: Nurse Practitioner

## 2018-04-12 DIAGNOSIS — Y999 Unspecified external cause status: Secondary | ICD-10-CM | POA: Insufficient documentation

## 2018-04-12 DIAGNOSIS — I69398 Other sequelae of cerebral infarction: Secondary | ICD-10-CM | POA: Diagnosis not present

## 2018-04-12 DIAGNOSIS — Y939 Activity, unspecified: Secondary | ICD-10-CM | POA: Diagnosis not present

## 2018-04-12 DIAGNOSIS — M25551 Pain in right hip: Secondary | ICD-10-CM | POA: Diagnosis not present

## 2018-04-12 DIAGNOSIS — Z79899 Other long term (current) drug therapy: Secondary | ICD-10-CM | POA: Insufficient documentation

## 2018-04-12 DIAGNOSIS — E876 Hypokalemia: Secondary | ICD-10-CM | POA: Diagnosis not present

## 2018-04-12 DIAGNOSIS — F0390 Unspecified dementia without behavioral disturbance: Secondary | ICD-10-CM | POA: Diagnosis not present

## 2018-04-12 DIAGNOSIS — Y9210 Unspecified residential institution as the place of occurrence of the external cause: Secondary | ICD-10-CM | POA: Insufficient documentation

## 2018-04-12 DIAGNOSIS — W19XXXA Unspecified fall, initial encounter: Secondary | ICD-10-CM

## 2018-04-12 DIAGNOSIS — W0110XA Fall on same level from slipping, tripping and stumbling with subsequent striking against unspecified object, initial encounter: Secondary | ICD-10-CM | POA: Diagnosis not present

## 2018-04-12 DIAGNOSIS — Z87891 Personal history of nicotine dependence: Secondary | ICD-10-CM | POA: Diagnosis not present

## 2018-04-12 DIAGNOSIS — E119 Type 2 diabetes mellitus without complications: Secondary | ICD-10-CM | POA: Diagnosis not present

## 2018-04-12 DIAGNOSIS — M25511 Pain in right shoulder: Secondary | ICD-10-CM | POA: Insufficient documentation

## 2018-04-12 DIAGNOSIS — S40811A Abrasion of right upper arm, initial encounter: Secondary | ICD-10-CM | POA: Diagnosis not present

## 2018-04-12 DIAGNOSIS — Z5181 Encounter for therapeutic drug level monitoring: Secondary | ICD-10-CM | POA: Diagnosis not present

## 2018-04-12 DIAGNOSIS — M21371 Foot drop, right foot: Secondary | ICD-10-CM | POA: Diagnosis not present

## 2018-04-12 DIAGNOSIS — I10 Essential (primary) hypertension: Secondary | ICD-10-CM | POA: Diagnosis not present

## 2018-04-12 DIAGNOSIS — R9389 Abnormal findings on diagnostic imaging of other specified body structures: Secondary | ICD-10-CM

## 2018-04-12 DIAGNOSIS — T148XXA Other injury of unspecified body region, initial encounter: Secondary | ICD-10-CM | POA: Diagnosis not present

## 2018-04-12 DIAGNOSIS — Z794 Long term (current) use of insulin: Secondary | ICD-10-CM | POA: Insufficient documentation

## 2018-04-12 DIAGNOSIS — M25559 Pain in unspecified hip: Secondary | ICD-10-CM | POA: Diagnosis not present

## 2018-04-12 DIAGNOSIS — M6281 Muscle weakness (generalized): Secondary | ICD-10-CM | POA: Diagnosis not present

## 2018-04-12 DIAGNOSIS — S79911A Unspecified injury of right hip, initial encounter: Secondary | ICD-10-CM | POA: Diagnosis not present

## 2018-04-12 DIAGNOSIS — R937 Abnormal findings on diagnostic imaging of other parts of musculoskeletal system: Secondary | ICD-10-CM | POA: Diagnosis not present

## 2018-04-12 DIAGNOSIS — E782 Mixed hyperlipidemia: Secondary | ICD-10-CM | POA: Diagnosis not present

## 2018-04-12 MED ORDER — ACETAMINOPHEN 500 MG PO TABS
500.0000 mg | ORAL_TABLET | Freq: Once | ORAL | Status: AC
  Administered 2018-04-12: 500 mg via ORAL
  Filled 2018-04-12: qty 1

## 2018-04-12 NOTE — ED Notes (Signed)
Patient unable to sign for discharge. [facility] called and given report. [facility] called for patient .

## 2018-04-12 NOTE — Discharge Instructions (Addendum)
Xrays of your right hip and shoulder were negative  Your left hip was visualized on xray and showed irregular sclerosis LEFT inferior pubic ramus. It is recommend you receive a NONEMERGENT LEFT hip radiographs for further characterization. Follow up with your PCP for this. Continue to use walker for ambulation.  Follow-up with your primary care provider for further evaluation. If you develop worsening or new concerning symptoms you can return to the emergency department for re-evaluation.
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
## 2018-04-17 DIAGNOSIS — M6281 Muscle weakness (generalized): Secondary | ICD-10-CM | POA: Diagnosis not present

## 2018-04-17 DIAGNOSIS — I69398 Other sequelae of cerebral infarction: Secondary | ICD-10-CM | POA: Diagnosis not present

## 2018-04-17 DIAGNOSIS — M21371 Foot drop, right foot: Secondary | ICD-10-CM | POA: Diagnosis not present

## 2018-04-17 DIAGNOSIS — F0390 Unspecified dementia without behavioral disturbance: Secondary | ICD-10-CM | POA: Diagnosis not present

## 2018-04-17 DIAGNOSIS — E119 Type 2 diabetes mellitus without complications: Secondary | ICD-10-CM | POA: Diagnosis not present

## 2018-04-17 DIAGNOSIS — I10 Essential (primary) hypertension: Secondary | ICD-10-CM | POA: Diagnosis not present

## 2018-04-18 DIAGNOSIS — I69398 Other sequelae of cerebral infarction: Secondary | ICD-10-CM | POA: Diagnosis not present

## 2018-04-18 DIAGNOSIS — I10 Essential (primary) hypertension: Secondary | ICD-10-CM | POA: Diagnosis not present

## 2018-04-18 DIAGNOSIS — F0390 Unspecified dementia without behavioral disturbance: Secondary | ICD-10-CM | POA: Diagnosis not present

## 2018-04-18 DIAGNOSIS — E119 Type 2 diabetes mellitus without complications: Secondary | ICD-10-CM | POA: Diagnosis not present

## 2018-04-18 DIAGNOSIS — M21371 Foot drop, right foot: Secondary | ICD-10-CM | POA: Diagnosis not present

## 2018-04-18 DIAGNOSIS — M6281 Muscle weakness (generalized): Secondary | ICD-10-CM | POA: Diagnosis not present

## 2018-04-19 DIAGNOSIS — E119 Type 2 diabetes mellitus without complications: Secondary | ICD-10-CM | POA: Diagnosis not present

## 2018-04-19 DIAGNOSIS — F419 Anxiety disorder, unspecified: Secondary | ICD-10-CM | POA: Diagnosis not present

## 2018-04-19 DIAGNOSIS — F5105 Insomnia due to other mental disorder: Secondary | ICD-10-CM | POA: Diagnosis not present

## 2018-04-19 DIAGNOSIS — E782 Mixed hyperlipidemia: Secondary | ICD-10-CM | POA: Diagnosis not present

## 2018-04-19 DIAGNOSIS — F0150 Vascular dementia without behavioral disturbance: Secondary | ICD-10-CM | POA: Diagnosis not present

## 2018-04-20 DIAGNOSIS — E119 Type 2 diabetes mellitus without complications: Secondary | ICD-10-CM | POA: Diagnosis not present

## 2018-04-20 DIAGNOSIS — F0390 Unspecified dementia without behavioral disturbance: Secondary | ICD-10-CM | POA: Diagnosis not present

## 2018-04-20 DIAGNOSIS — I10 Essential (primary) hypertension: Secondary | ICD-10-CM | POA: Diagnosis not present

## 2018-04-20 DIAGNOSIS — M6281 Muscle weakness (generalized): Secondary | ICD-10-CM | POA: Diagnosis not present

## 2018-04-20 DIAGNOSIS — M21371 Foot drop, right foot: Secondary | ICD-10-CM | POA: Diagnosis not present

## 2018-04-20 DIAGNOSIS — I69398 Other sequelae of cerebral infarction: Secondary | ICD-10-CM | POA: Diagnosis not present

## 2018-04-21 DIAGNOSIS — R296 Repeated falls: Secondary | ICD-10-CM | POA: Diagnosis not present

## 2018-04-21 DIAGNOSIS — R269 Unspecified abnormalities of gait and mobility: Secondary | ICD-10-CM | POA: Diagnosis not present

## 2018-04-24 DIAGNOSIS — F5105 Insomnia due to other mental disorder: Secondary | ICD-10-CM | POA: Diagnosis not present

## 2018-04-24 DIAGNOSIS — M6281 Muscle weakness (generalized): Secondary | ICD-10-CM | POA: Diagnosis not present

## 2018-04-24 DIAGNOSIS — F0150 Vascular dementia without behavioral disturbance: Secondary | ICD-10-CM | POA: Diagnosis not present

## 2018-04-24 DIAGNOSIS — E876 Hypokalemia: Secondary | ICD-10-CM | POA: Diagnosis not present

## 2018-04-24 DIAGNOSIS — I10 Essential (primary) hypertension: Secondary | ICD-10-CM | POA: Diagnosis not present

## 2018-04-24 DIAGNOSIS — E782 Mixed hyperlipidemia: Secondary | ICD-10-CM | POA: Diagnosis not present

## 2018-04-24 DIAGNOSIS — E1140 Type 2 diabetes mellitus with diabetic neuropathy, unspecified: Secondary | ICD-10-CM | POA: Diagnosis not present

## 2018-04-24 DIAGNOSIS — R2681 Unsteadiness on feet: Secondary | ICD-10-CM | POA: Diagnosis not present

## 2018-04-24 DIAGNOSIS — F419 Anxiety disorder, unspecified: Secondary | ICD-10-CM | POA: Diagnosis not present

## 2018-04-24 DIAGNOSIS — E119 Type 2 diabetes mellitus without complications: Secondary | ICD-10-CM | POA: Diagnosis not present

## 2018-04-24 DIAGNOSIS — F411 Generalized anxiety disorder: Secondary | ICD-10-CM | POA: Diagnosis not present

## 2018-04-25 DIAGNOSIS — I10 Essential (primary) hypertension: Secondary | ICD-10-CM | POA: Diagnosis not present

## 2018-04-25 DIAGNOSIS — I69398 Other sequelae of cerebral infarction: Secondary | ICD-10-CM | POA: Diagnosis not present

## 2018-04-25 DIAGNOSIS — M21371 Foot drop, right foot: Secondary | ICD-10-CM | POA: Diagnosis not present

## 2018-04-25 DIAGNOSIS — F0390 Unspecified dementia without behavioral disturbance: Secondary | ICD-10-CM | POA: Diagnosis not present

## 2018-04-25 DIAGNOSIS — M6281 Muscle weakness (generalized): Secondary | ICD-10-CM | POA: Diagnosis not present

## 2018-04-25 DIAGNOSIS — E119 Type 2 diabetes mellitus without complications: Secondary | ICD-10-CM | POA: Diagnosis not present

## 2018-04-26 DIAGNOSIS — E119 Type 2 diabetes mellitus without complications: Secondary | ICD-10-CM | POA: Diagnosis not present

## 2018-04-26 DIAGNOSIS — F0390 Unspecified dementia without behavioral disturbance: Secondary | ICD-10-CM | POA: Diagnosis not present

## 2018-04-26 DIAGNOSIS — M6281 Muscle weakness (generalized): Secondary | ICD-10-CM | POA: Diagnosis not present

## 2018-04-26 DIAGNOSIS — I10 Essential (primary) hypertension: Secondary | ICD-10-CM | POA: Diagnosis not present

## 2018-04-26 DIAGNOSIS — M21371 Foot drop, right foot: Secondary | ICD-10-CM | POA: Diagnosis not present

## 2018-04-26 DIAGNOSIS — I69398 Other sequelae of cerebral infarction: Secondary | ICD-10-CM | POA: Diagnosis not present

## 2018-04-30 DIAGNOSIS — F411 Generalized anxiety disorder: Secondary | ICD-10-CM | POA: Diagnosis not present

## 2018-04-30 DIAGNOSIS — E1142 Type 2 diabetes mellitus with diabetic polyneuropathy: Secondary | ICD-10-CM | POA: Diagnosis not present

## 2018-04-30 DIAGNOSIS — M6281 Muscle weakness (generalized): Secondary | ICD-10-CM | POA: Diagnosis not present

## 2018-04-30 DIAGNOSIS — I69291 Dysphagia following other nontraumatic intracranial hemorrhage: Secondary | ICD-10-CM | POA: Diagnosis not present

## 2018-04-30 DIAGNOSIS — Z87440 Personal history of urinary (tract) infections: Secondary | ICD-10-CM | POA: Diagnosis not present

## 2018-04-30 DIAGNOSIS — I10 Essential (primary) hypertension: Secondary | ICD-10-CM | POA: Diagnosis not present

## 2018-04-30 DIAGNOSIS — I69398 Other sequelae of cerebral infarction: Secondary | ICD-10-CM | POA: Diagnosis not present

## 2018-04-30 DIAGNOSIS — M21371 Foot drop, right foot: Secondary | ICD-10-CM | POA: Diagnosis not present

## 2018-04-30 DIAGNOSIS — I69220 Aphasia following other nontraumatic intracranial hemorrhage: Secondary | ICD-10-CM | POA: Diagnosis not present

## 2018-04-30 DIAGNOSIS — Z794 Long term (current) use of insulin: Secondary | ICD-10-CM | POA: Diagnosis not present

## 2018-04-30 DIAGNOSIS — F0390 Unspecified dementia without behavioral disturbance: Secondary | ICD-10-CM | POA: Diagnosis not present

## 2018-05-01 DIAGNOSIS — E1142 Type 2 diabetes mellitus with diabetic polyneuropathy: Secondary | ICD-10-CM | POA: Diagnosis not present

## 2018-05-01 DIAGNOSIS — M6281 Muscle weakness (generalized): Secondary | ICD-10-CM | POA: Diagnosis not present

## 2018-05-01 DIAGNOSIS — I10 Essential (primary) hypertension: Secondary | ICD-10-CM | POA: Diagnosis not present

## 2018-05-01 DIAGNOSIS — F0390 Unspecified dementia without behavioral disturbance: Secondary | ICD-10-CM | POA: Diagnosis not present

## 2018-05-01 DIAGNOSIS — M21371 Foot drop, right foot: Secondary | ICD-10-CM | POA: Diagnosis not present

## 2018-05-01 DIAGNOSIS — I69398 Other sequelae of cerebral infarction: Secondary | ICD-10-CM | POA: Diagnosis not present

## 2018-05-02 DIAGNOSIS — E1142 Type 2 diabetes mellitus with diabetic polyneuropathy: Secondary | ICD-10-CM | POA: Diagnosis not present

## 2018-05-02 DIAGNOSIS — I69398 Other sequelae of cerebral infarction: Secondary | ICD-10-CM | POA: Diagnosis not present

## 2018-05-02 DIAGNOSIS — I10 Essential (primary) hypertension: Secondary | ICD-10-CM | POA: Diagnosis not present

## 2018-05-02 DIAGNOSIS — M21371 Foot drop, right foot: Secondary | ICD-10-CM | POA: Diagnosis not present

## 2018-05-02 DIAGNOSIS — M6281 Muscle weakness (generalized): Secondary | ICD-10-CM | POA: Diagnosis not present

## 2018-05-02 DIAGNOSIS — F0390 Unspecified dementia without behavioral disturbance: Secondary | ICD-10-CM | POA: Diagnosis not present

## 2018-05-03 DIAGNOSIS — I69398 Other sequelae of cerebral infarction: Secondary | ICD-10-CM | POA: Diagnosis not present

## 2018-05-03 DIAGNOSIS — I10 Essential (primary) hypertension: Secondary | ICD-10-CM | POA: Diagnosis not present

## 2018-05-03 DIAGNOSIS — F0390 Unspecified dementia without behavioral disturbance: Secondary | ICD-10-CM | POA: Diagnosis not present

## 2018-05-03 DIAGNOSIS — E1142 Type 2 diabetes mellitus with diabetic polyneuropathy: Secondary | ICD-10-CM | POA: Diagnosis not present

## 2018-05-03 DIAGNOSIS — M6281 Muscle weakness (generalized): Secondary | ICD-10-CM | POA: Diagnosis not present

## 2018-05-03 DIAGNOSIS — M21371 Foot drop, right foot: Secondary | ICD-10-CM | POA: Diagnosis not present

## 2018-05-05 DIAGNOSIS — E119 Type 2 diabetes mellitus without complications: Secondary | ICD-10-CM | POA: Diagnosis not present

## 2018-05-05 DIAGNOSIS — E782 Mixed hyperlipidemia: Secondary | ICD-10-CM | POA: Diagnosis not present

## 2018-05-05 DIAGNOSIS — R269 Unspecified abnormalities of gait and mobility: Secondary | ICD-10-CM | POA: Diagnosis not present

## 2018-05-05 DIAGNOSIS — E1140 Type 2 diabetes mellitus with diabetic neuropathy, unspecified: Secondary | ICD-10-CM | POA: Diagnosis not present

## 2018-05-05 DIAGNOSIS — R296 Repeated falls: Secondary | ICD-10-CM | POA: Diagnosis not present

## 2018-05-05 DIAGNOSIS — R2681 Unsteadiness on feet: Secondary | ICD-10-CM | POA: Diagnosis not present

## 2018-05-05 DIAGNOSIS — I10 Essential (primary) hypertension: Secondary | ICD-10-CM | POA: Diagnosis not present

## 2018-05-07 ENCOUNTER — Encounter (HOSPITAL_COMMUNITY): Payer: Self-pay | Admitting: *Deleted

## 2018-05-07 ENCOUNTER — Emergency Department (HOSPITAL_COMMUNITY)
Admission: EM | Admit: 2018-05-07 | Discharge: 2018-05-08 | Disposition: A | Discharge status: Skilled Nursing Facility | Payer: Medicare Other | Attending: Emergency Medicine | Admitting: Emergency Medicine

## 2018-05-07 ENCOUNTER — Other Ambulatory Visit: Payer: Self-pay

## 2018-05-07 ENCOUNTER — Emergency Department (HOSPITAL_COMMUNITY): Payer: Medicare Other

## 2018-05-07 DIAGNOSIS — E119 Type 2 diabetes mellitus without complications: Secondary | ICD-10-CM | POA: Insufficient documentation

## 2018-05-07 DIAGNOSIS — Z87891 Personal history of nicotine dependence: Secondary | ICD-10-CM | POA: Diagnosis not present

## 2018-05-07 DIAGNOSIS — T148XXA Other injury of unspecified body region, initial encounter: Secondary | ICD-10-CM | POA: Diagnosis not present

## 2018-05-07 DIAGNOSIS — Z8673 Personal history of transient ischemic attack (TIA), and cerebral infarction without residual deficits: Secondary | ICD-10-CM | POA: Insufficient documentation

## 2018-05-07 DIAGNOSIS — R52 Pain, unspecified: Secondary | ICD-10-CM | POA: Diagnosis not present

## 2018-05-07 DIAGNOSIS — R4701 Aphasia: Secondary | ICD-10-CM | POA: Diagnosis not present

## 2018-05-07 DIAGNOSIS — Z79899 Other long term (current) drug therapy: Secondary | ICD-10-CM | POA: Insufficient documentation

## 2018-05-07 DIAGNOSIS — Z043 Encounter for examination and observation following other accident: Secondary | ICD-10-CM | POA: Insufficient documentation

## 2018-05-07 DIAGNOSIS — S199XXA Unspecified injury of neck, initial encounter: Secondary | ICD-10-CM | POA: Diagnosis not present

## 2018-05-07 DIAGNOSIS — W050XXA Fall from non-moving wheelchair, initial encounter: Secondary | ICD-10-CM | POA: Insufficient documentation

## 2018-05-07 DIAGNOSIS — R531 Weakness: Secondary | ICD-10-CM | POA: Diagnosis present

## 2018-05-07 DIAGNOSIS — Z794 Long term (current) use of insulin: Secondary | ICD-10-CM | POA: Insufficient documentation

## 2018-05-07 DIAGNOSIS — S0990XA Unspecified injury of head, initial encounter: Secondary | ICD-10-CM | POA: Diagnosis not present

## 2018-05-07 DIAGNOSIS — W19XXXA Unspecified fall, initial encounter: Secondary | ICD-10-CM

## 2018-05-07 NOTE — ED Notes (Signed)
Bed: WA09 Expected date:  Expected time:  Means of arrival:  Comments: Ems 67 f fall

## 2018-05-07 NOTE — Discharge Instructions (Addendum)
Please follow-up with primary care doctor if symptoms are not improving.  Return the ED for any worsening symptoms.
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
## 2018-05-08 DIAGNOSIS — I69398 Other sequelae of cerebral infarction: Secondary | ICD-10-CM | POA: Diagnosis not present

## 2018-05-08 DIAGNOSIS — M21371 Foot drop, right foot: Secondary | ICD-10-CM | POA: Diagnosis not present

## 2018-05-08 DIAGNOSIS — E1142 Type 2 diabetes mellitus with diabetic polyneuropathy: Secondary | ICD-10-CM | POA: Diagnosis not present

## 2018-05-08 DIAGNOSIS — M6281 Muscle weakness (generalized): Secondary | ICD-10-CM | POA: Diagnosis not present

## 2018-05-08 DIAGNOSIS — I10 Essential (primary) hypertension: Secondary | ICD-10-CM | POA: Diagnosis not present

## 2018-05-08 DIAGNOSIS — F0390 Unspecified dementia without behavioral disturbance: Secondary | ICD-10-CM | POA: Diagnosis not present

## 2018-05-08 NOTE — ED Provider Notes (Signed)
[facility] DEPT Provider Note   CSN: [id number] Arrival date & time: 05/07/18  1956     History   Chief Complaint Chief Complaint  Patient presents with  . Fall    HPI Jammy Stlouis Sheila [name] is a 67 y.o. female.  HPI 67 year old African-American female past medical history significant for prior CVA with aphasia and right-sided weakness, diabetes, hyperlipidemia that presents to the emergency department today with family at bedside by EMS from facility for unwitnessed fall.  According to patient's family they were notified that patient was outside in the wheelchair and try to get up and fell down.  She has a history of same.  She is at her baseline and per the family.  I have been unable to get a hold of the facility to discuss events.  Patient does have a history of stroke with previous aphasia and again is at her baseline.  Patient does remember the fall and does not report LOC.  Patient denies any pain at this time.  She is not on blood thinners.  Patient states that she just wants to get back home to facility. Past Medical History:  Diagnosis Date  . Anxiety   . Aphasia   . Arthritis   . Diabetes mellitus    complicated by peripheral neuropathy  . Dysphagia following nontraumatic intracerebral hemorrhage   . Hyperlipidemia   . Muscle weakness (generalized)   . Stroke [facility]) 2015   residual right foot drop    Patient Active Problem List   Diagnosis Date Noted  . Intraventricular hemorrhage ([place]) 01/05/2018  . Hypertensive crisis   . Lethargic 05/05/2017  . Low blood sugar 05/05/2017  . Nail abnormalities 12/31/2016  . Memory changes 09/16/2016  . Multiple falls 09/16/2016  . UTI (urinary tract infection) 02/28/2016  . Depression 02/28/2016  . Acute ischemic VBA thalamic stroke ([place])   . Stroke (cerebrum) ([place])   . Anomia 02/17/2016  . Stroke ([place]) 02/17/2016  . Peripheral arterial disease ([place]) 12/03/2015  . Neuropathy 09/05/2015  .  Medicare annual wellness visit, subsequent 03/12/2015  . Routine general medical examination at a health care facility 10/08/2014  . Cerumen impaction 10/08/2014  . Low back pain potentially associated with radiculopathy 03/01/2013  . Hyperlipidemia 02/15/2012  . Type 2 diabetes mellitus with neurologic complication ([place]) 27/78/2423    Past Surgical History:  Procedure Laterality Date  . CHOLECYSTECTOMY    . TONSILLECTOMY       OB History   None      Home Medications    Prior to Admission medications   Medication Sig Start Date End Date Taking? Authorizing Provider  acetaminophen (TYLENOL) 325 MG tablet Take 650 mg by mouth every 4 (four) hours as needed for mild pain, moderate pain or headache.   Yes [provider]  Amantadine HCl 100 MG tablet Take 50 mg by mouth 2 (two) times daily.   Yes [provider]  atorvastatin (LIPITOR) 20 MG tablet Take 1 tablet (20 mg total) by mouth daily at 6 PM. 01/10/18  Yes Costello, Kayren Eaves, NP  divalproex (DEPAKOTE) 125 MG DR tablet Take 125 mg by mouth 3 (three) times daily.   Yes [provider]  Dulaglutide (TRULICITY) 5.36 RW/4.3XV SOPN INJECT 0.75 MG INTO THE SKIN SUBCUTANEOUSLY ONCE WEEKLY Patient taking differently: Inject 0.75 mg into the skin once a week. INJECT 0.75 MG INTO THE SKIN SUBCUTANEOUSLY ONCE WEEKLY 06/02/17  Yes Golden Circle, FNP  enalapril (VASOTEC) 5 MG  tablet Take 1 tablet (5 mg total) by mouth 2 (two) times daily. 01/10/18  Yes Costello, Kayren Eaves, NP  fish oil-omega-3 fatty acids 1000 MG capsule Take 1 g by mouth daily.   Yes [provider]  gabapentin (NEURONTIN) 300 MG capsule Take 1 capsule by mouth at  bedtime 12/16/15  Yes Golden Circle, FNP  Insulin Glargine (LANTUS SOLOSTAR) 100 UNIT/ML Solostar Pen Inject 30 Units into the skin daily. Follow-up appt was due back in June must see provider for future refills Patient taking differently: Inject 5 Units into the skin daily.   08/22/17  Yes Golden Circle, FNP  Insulin Pen Needle 31G X 6 MM MISC Use as needed to inject insulin in the skin. 06/24/17  Yes Golden Circle, FNP  LORazepam (ATIVAN) 1 MG tablet Take 1 mg by mouth 2 (two) times daily.   Yes [provider]  magnesium oxide (MAG-OX) 400 (241.3 Mg) MG tablet Take 1 tablet (400 mg total) by mouth daily. 01/10/18  Yes Costello, Kayren Eaves, NP  metFORMIN (GLUCOPHAGE) 500 MG tablet Take 1 tablet (500 mg total) by mouth 2 (two) times daily with a meal. 06/13/17  Yes Golden Circle, FNP  Multiple Vitamin (DAILY-VITE) TABS Take 1 tablet by mouth every morning.   Yes [provider]  potassium chloride (K-DUR) 10 MEQ tablet Take 1 tablet (10 mEq total) by mouth daily. 01/10/18  Yes Costello, Kayren Eaves, NP    Family History Family History  Problem Relation Age of Onset  . Diabetes Mother        Deceased  . Diabetes Brother   . Healthy Son     Social History Social History   Tobacco Use  . Smoking status: Former Smoker    Types: Cigarettes    Last attempt to quit: 02/15/1992    Years since quitting: 26.2  . Smokeless tobacco: Never Used  Substance Use Topics  . Alcohol use: No    Alcohol/week: 0.0 oz    Comment: Socially  . Drug use: No    Types: Marijuana    Comment: SMOKES IT ANY TIME SHE CAN GET IT     Allergies   Sulfa antibiotics; Paxil [paroxetine hcl]; and Iopamidol   Review of Systems Review of Systems  Constitutional: Negative for fever.  HENT: Negative for congestion.   Eyes: Negative for visual disturbance.  Respiratory: Negative for shortness of breath.   Cardiovascular: Negative for chest pain.  Gastrointestinal: Negative for abdominal pain.  Musculoskeletal: Negative for arthralgias and neck pain.  Skin: Negative for color change.  Neurological: Negative for headaches.     Physical Exam Updated Vital Signs BP (!) 114/57 (BP Location: Right Arm)   Pulse 97   Temp 97.6 F (36.4 C) (Oral)   Resp 13   SpO2  100%   Physical Exam  Constitutional: She appears well-developed and well-nourished. No distress.  HENT:  Head: Normocephalic and atraumatic.  No bilateral hemotympanum.  No septal hematoma.  Skull depression.  Oropharynx is clear.  Eyes: Pupils are equal, round, and reactive to light. Conjunctivae and EOM are normal. Right eye exhibits no discharge. Left eye exhibits no discharge. No scleral icterus.  Neck: Normal range of motion. Neck supple.  No c spine midline tenderness. No paraspinal tenderness. No deformities or step offs noted. Full ROM. Supple. No nuchal rigidity.    Cardiovascular: Normal rate, regular rhythm, normal heart sounds and intact distal pulses. Exam reveals no gallop and no friction rub.  No murmur heard. Pulmonary/Chest: Effort normal and breath sounds normal. No stridor. No respiratory distress. She has no wheezes. She has no rales. She exhibits no tenderness.  Abdominal: Soft.  Musculoskeletal: Normal range of motion.  No midline T spine or L spine tenderness. No deformities or step offs noted. Full ROM. Pelvis is stable.   Neurological: She is alert.  Patient oriented to person and place but not time which is baseline per family.  Patient has some aphasia noted which is baseline.  Follows commands appropriately.  Moves all extremities without any difficulties.  Finger-to-nose without any ataxia.  Romberg and gait deferred as patient is wheelchair-bound.  Skin: Skin is warm and dry. Capillary refill takes less than 2 seconds. No pallor.  Psychiatric: Her behavior is normal. Judgment and thought content normal.  Nursing note and vitals reviewed.    ED Treatments / Results  Labs (all labs ordered are listed, but only abnormal results are displayed) Labs Reviewed - No data to display  EKG None  Radiology Ct Head Wo Contrast  Result Date: 05/07/2018 CLINICAL DATA:  Unwitnessed fall from wheelchair. Diffuse pain. History of stroke, hyperlipidemia, diabetes.  EXAM: CT HEAD WITHOUT CONTRAST CT CERVICAL SPINE WITHOUT CONTRAST TECHNIQUE: Multidetector CT imaging of the head and cervical spine was performed following the standard protocol without intravenous contrast. Multiplanar CT image reconstructions of the cervical spine were also generated. COMPARISON:  CT HEAD January 09, 2018 and CT cervical spine September 08, 2016. FINDINGS: CT HEAD FINDINGS BRAIN: No intraparenchymal hemorrhage, mass effect nor midline shift. Moderate to severe parenchymal brain volume loss. No hydrocephalus. Resolution of intraventricular blood products. Old LEFT basal ganglia and LEFT thalamus lacunar infarcts. Confluent supratentorial white matter hypodensities. No acute large vascular territory infarcts. No abnormal extra-axial fluid collections. Basal cisterns are patent. VASCULAR: Moderate calcific atherosclerosis of the carotid siphons. SKULL: No skull fracture. No significant scalp soft tissue swelling. SINUSES/ORBITS: The mastoid air-cells and included paranasal sinuses are well-aerated.The included ocular globes and orbital contents are non-suspicious. OTHER: Patient is edentulous. CT CERVICAL SPINE FINDINGS ALIGNMENT: Broad reversed lordosis.  Vertebral bodies in alignment. SKULL BASE AND VERTEBRAE: Cervical vertebral bodies and posterior elements are intact. Moderate to severe C5-6 disc height loss and endplate spurring compatible with degenerative disc, stable from prior imaging. No destructive bony lesions. C1-2 articulation maintained mild arthropathy. Moderate RIGHT upper cervical facet arthropathy. SOFT TISSUES AND SPINAL CANAL: Nonacute. Mild calcific atherosclerosis carotid bifurcations. DISC LEVELS: No significant osseous canal stenosis or neural foraminal infarcts. UPPER CHEST: Lung apices are clear. OTHER: None. IMPRESSION: CT HEAD: 1. No acute intracranial process. 2. Stable moderate to severe parenchymal brain volume loss. 3. Severe chronic small vessel ischemic changes.  Old LEFT basal ganglia and thalamus lacunar infarcts. CT CERVICAL SPINE: 1. No fracture or malalignment. Electronically Signed   By: Elon Alas M.D.   On: 05/07/2018 23:01   Ct Cervical Spine Wo Contrast  Result Date: 05/07/2018 CLINICAL DATA:  Unwitnessed fall from wheelchair. Diffuse pain. History of stroke, hyperlipidemia, diabetes. EXAM: CT HEAD WITHOUT CONTRAST CT CERVICAL SPINE WITHOUT CONTRAST TECHNIQUE: Multidetector CT imaging of the head and cervical spine was performed following the standard protocol without intravenous contrast. Multiplanar CT image reconstructions of the cervical spine were also generated. COMPARISON:  CT HEAD January 09, 2018 and CT cervical spine September 08, 2016. FINDINGS: CT HEAD FINDINGS BRAIN: No intraparenchymal hemorrhage, mass effect nor midline shift. Moderate to severe parenchymal brain volume loss. No hydrocephalus. Resolution of intraventricular blood products. Old LEFT  basal ganglia and LEFT thalamus lacunar infarcts. Confluent supratentorial white matter hypodensities. No acute large vascular territory infarcts. No abnormal extra-axial fluid collections. Basal cisterns are patent. VASCULAR: Moderate calcific atherosclerosis of the carotid siphons. SKULL: No skull fracture. No significant scalp soft tissue swelling. SINUSES/ORBITS: The mastoid air-cells and included paranasal sinuses are well-aerated.The included ocular globes and orbital contents are non-suspicious. OTHER: Patient is edentulous. CT CERVICAL SPINE FINDINGS ALIGNMENT: Broad reversed lordosis.  Vertebral bodies in alignment. SKULL BASE AND VERTEBRAE: Cervical vertebral bodies and posterior elements are intact. Moderate to severe C5-6 disc height loss and endplate spurring compatible with degenerative disc, stable from prior imaging. No destructive bony lesions. C1-2 articulation maintained mild arthropathy. Moderate RIGHT upper cervical facet arthropathy. SOFT TISSUES AND SPINAL CANAL: Nonacute.  Mild calcific atherosclerosis carotid bifurcations. DISC LEVELS: No significant osseous canal stenosis or neural foraminal infarcts. UPPER CHEST: Lung apices are clear. OTHER: None. IMPRESSION: CT HEAD: 1. No acute intracranial process. 2. Stable moderate to severe parenchymal brain volume loss. 3. Severe chronic small vessel ischemic changes. Old LEFT basal ganglia and thalamus lacunar infarcts. CT CERVICAL SPINE: 1. No fracture or malalignment. Electronically Signed   By: Elon Alas M.D.   On: 05/07/2018 23:01    Procedures Procedures (including critical care time)  Medications Ordered in ED Medications - No data to display   Initial Impression / Assessment and Plan / ED Course  I have reviewed the triage vital signs and the nursing notes.  Pertinent labs & imaging results that were available during my care of the patient were reviewed by me and considered in my medical decision making (see chart for details).     Patient presents to the ED from facility with unwitnessed fall.  This appears to be mechanical in nature as patient has a history of same.  She tries to get out of her wheelchair and will fall.  Do not feel that infectious work-up is indicated as patient is afebrile, without any confusion and vital signs are reassuring.  Patient denies LOC or pain at this time.  Family at bedside states that she is at her baseline.  CT imaging of head and cervical spine were reassuring without any acute findings.  Patient's vital signs remained reassuring.  Given that patient is at her baseline we will discharge patient back to facility.  Family is agreeable with this plan.  Patient continues to deny pain.  Discussed follow-up with primary care doctor and return precautions were discussed.  Patient was discussed and seen by my attending who was agreeable with the above plan.  Final Clinical Impressions(s) / ED Diagnoses   Final diagnoses:  Fall, initial encounter    ED Discharge Orders     None       Aaron Edelman 05/08/18 [id number]    Tegeler, Gwenyth Allegra, MD 05/08/18 [id number]

## 2018-05-09 DIAGNOSIS — M21371 Foot drop, right foot: Secondary | ICD-10-CM | POA: Diagnosis not present

## 2018-05-09 DIAGNOSIS — M6281 Muscle weakness (generalized): Secondary | ICD-10-CM | POA: Diagnosis not present

## 2018-05-09 DIAGNOSIS — F0390 Unspecified dementia without behavioral disturbance: Secondary | ICD-10-CM | POA: Diagnosis not present

## 2018-05-09 DIAGNOSIS — I69398 Other sequelae of cerebral infarction: Secondary | ICD-10-CM | POA: Diagnosis not present

## 2018-05-09 DIAGNOSIS — I10 Essential (primary) hypertension: Secondary | ICD-10-CM | POA: Diagnosis not present

## 2018-05-09 DIAGNOSIS — E1142 Type 2 diabetes mellitus with diabetic polyneuropathy: Secondary | ICD-10-CM | POA: Diagnosis not present

## 2018-05-10 DIAGNOSIS — I10 Essential (primary) hypertension: Secondary | ICD-10-CM | POA: Diagnosis not present

## 2018-05-10 DIAGNOSIS — F0390 Unspecified dementia without behavioral disturbance: Secondary | ICD-10-CM | POA: Diagnosis not present

## 2018-05-10 DIAGNOSIS — M21371 Foot drop, right foot: Secondary | ICD-10-CM | POA: Diagnosis not present

## 2018-05-10 DIAGNOSIS — M6281 Muscle weakness (generalized): Secondary | ICD-10-CM | POA: Diagnosis not present

## 2018-05-10 DIAGNOSIS — E1142 Type 2 diabetes mellitus with diabetic polyneuropathy: Secondary | ICD-10-CM | POA: Diagnosis not present

## 2018-05-10 DIAGNOSIS — I69398 Other sequelae of cerebral infarction: Secondary | ICD-10-CM | POA: Diagnosis not present

## 2018-05-12 DIAGNOSIS — F0150 Vascular dementia without behavioral disturbance: Secondary | ICD-10-CM | POA: Diagnosis not present

## 2018-05-12 DIAGNOSIS — F419 Anxiety disorder, unspecified: Secondary | ICD-10-CM | POA: Diagnosis not present

## 2018-05-12 DIAGNOSIS — F5105 Insomnia due to other mental disorder: Secondary | ICD-10-CM | POA: Diagnosis not present

## 2018-05-13 DIAGNOSIS — F0390 Unspecified dementia without behavioral disturbance: Secondary | ICD-10-CM | POA: Diagnosis not present

## 2018-05-13 DIAGNOSIS — M21371 Foot drop, right foot: Secondary | ICD-10-CM | POA: Diagnosis not present

## 2018-05-13 DIAGNOSIS — I69398 Other sequelae of cerebral infarction: Secondary | ICD-10-CM | POA: Diagnosis not present

## 2018-05-13 DIAGNOSIS — E1142 Type 2 diabetes mellitus with diabetic polyneuropathy: Secondary | ICD-10-CM | POA: Diagnosis not present

## 2018-05-13 DIAGNOSIS — I10 Essential (primary) hypertension: Secondary | ICD-10-CM | POA: Diagnosis not present

## 2018-05-13 DIAGNOSIS — M6281 Muscle weakness (generalized): Secondary | ICD-10-CM | POA: Diagnosis not present

## 2018-05-15 DIAGNOSIS — M21371 Foot drop, right foot: Secondary | ICD-10-CM | POA: Diagnosis not present

## 2018-05-15 DIAGNOSIS — E1142 Type 2 diabetes mellitus with diabetic polyneuropathy: Secondary | ICD-10-CM | POA: Diagnosis not present

## 2018-05-15 DIAGNOSIS — I69398 Other sequelae of cerebral infarction: Secondary | ICD-10-CM | POA: Diagnosis not present

## 2018-05-15 DIAGNOSIS — F0390 Unspecified dementia without behavioral disturbance: Secondary | ICD-10-CM | POA: Diagnosis not present

## 2018-05-15 DIAGNOSIS — I10 Essential (primary) hypertension: Secondary | ICD-10-CM | POA: Diagnosis not present

## 2018-05-15 DIAGNOSIS — M6281 Muscle weakness (generalized): Secondary | ICD-10-CM | POA: Diagnosis not present

## 2018-05-17 DIAGNOSIS — I10 Essential (primary) hypertension: Secondary | ICD-10-CM | POA: Diagnosis not present

## 2018-05-17 DIAGNOSIS — M6281 Muscle weakness (generalized): Secondary | ICD-10-CM | POA: Diagnosis not present

## 2018-05-17 DIAGNOSIS — F0390 Unspecified dementia without behavioral disturbance: Secondary | ICD-10-CM | POA: Diagnosis not present

## 2018-05-17 DIAGNOSIS — E1142 Type 2 diabetes mellitus with diabetic polyneuropathy: Secondary | ICD-10-CM | POA: Diagnosis not present

## 2018-05-17 DIAGNOSIS — M21371 Foot drop, right foot: Secondary | ICD-10-CM | POA: Diagnosis not present

## 2018-05-17 DIAGNOSIS — I69398 Other sequelae of cerebral infarction: Secondary | ICD-10-CM | POA: Diagnosis not present

## 2018-05-18 DIAGNOSIS — M21371 Foot drop, right foot: Secondary | ICD-10-CM | POA: Diagnosis not present

## 2018-05-18 DIAGNOSIS — I10 Essential (primary) hypertension: Secondary | ICD-10-CM | POA: Diagnosis not present

## 2018-05-18 DIAGNOSIS — F0390 Unspecified dementia without behavioral disturbance: Secondary | ICD-10-CM | POA: Diagnosis not present

## 2018-05-18 DIAGNOSIS — M6281 Muscle weakness (generalized): Secondary | ICD-10-CM | POA: Diagnosis not present

## 2018-05-18 DIAGNOSIS — E1142 Type 2 diabetes mellitus with diabetic polyneuropathy: Secondary | ICD-10-CM | POA: Diagnosis not present

## 2018-05-18 DIAGNOSIS — I69398 Other sequelae of cerebral infarction: Secondary | ICD-10-CM | POA: Diagnosis not present

## 2018-05-23 DIAGNOSIS — I69398 Other sequelae of cerebral infarction: Secondary | ICD-10-CM | POA: Diagnosis not present

## 2018-05-23 DIAGNOSIS — M6281 Muscle weakness (generalized): Secondary | ICD-10-CM | POA: Diagnosis not present

## 2018-05-23 DIAGNOSIS — M21371 Foot drop, right foot: Secondary | ICD-10-CM | POA: Diagnosis not present

## 2018-05-23 DIAGNOSIS — I10 Essential (primary) hypertension: Secondary | ICD-10-CM | POA: Diagnosis not present

## 2018-05-23 DIAGNOSIS — F0390 Unspecified dementia without behavioral disturbance: Secondary | ICD-10-CM | POA: Diagnosis not present

## 2018-05-23 DIAGNOSIS — E1142 Type 2 diabetes mellitus with diabetic polyneuropathy: Secondary | ICD-10-CM | POA: Diagnosis not present

## 2018-05-24 DIAGNOSIS — I10 Essential (primary) hypertension: Secondary | ICD-10-CM | POA: Diagnosis not present

## 2018-05-24 DIAGNOSIS — F5105 Insomnia due to other mental disorder: Secondary | ICD-10-CM | POA: Diagnosis not present

## 2018-05-24 DIAGNOSIS — M21371 Foot drop, right foot: Secondary | ICD-10-CM | POA: Diagnosis not present

## 2018-05-24 DIAGNOSIS — F0390 Unspecified dementia without behavioral disturbance: Secondary | ICD-10-CM | POA: Diagnosis not present

## 2018-05-24 DIAGNOSIS — E782 Mixed hyperlipidemia: Secondary | ICD-10-CM | POA: Diagnosis not present

## 2018-05-24 DIAGNOSIS — F411 Generalized anxiety disorder: Secondary | ICD-10-CM | POA: Diagnosis not present

## 2018-05-24 DIAGNOSIS — R2681 Unsteadiness on feet: Secondary | ICD-10-CM | POA: Diagnosis not present

## 2018-05-24 DIAGNOSIS — E119 Type 2 diabetes mellitus without complications: Secondary | ICD-10-CM | POA: Diagnosis not present

## 2018-05-24 DIAGNOSIS — F0150 Vascular dementia without behavioral disturbance: Secondary | ICD-10-CM | POA: Diagnosis not present

## 2018-05-24 DIAGNOSIS — F419 Anxiety disorder, unspecified: Secondary | ICD-10-CM | POA: Diagnosis not present

## 2018-05-24 DIAGNOSIS — I69398 Other sequelae of cerebral infarction: Secondary | ICD-10-CM | POA: Diagnosis not present

## 2018-05-24 DIAGNOSIS — E1142 Type 2 diabetes mellitus with diabetic polyneuropathy: Secondary | ICD-10-CM | POA: Diagnosis not present

## 2018-05-24 DIAGNOSIS — M6281 Muscle weakness (generalized): Secondary | ICD-10-CM | POA: Diagnosis not present

## 2018-05-24 DIAGNOSIS — E876 Hypokalemia: Secondary | ICD-10-CM | POA: Diagnosis not present

## 2018-05-24 DIAGNOSIS — E1140 Type 2 diabetes mellitus with diabetic neuropathy, unspecified: Secondary | ICD-10-CM | POA: Diagnosis not present

## 2018-05-25 DIAGNOSIS — M21371 Foot drop, right foot: Secondary | ICD-10-CM | POA: Diagnosis not present

## 2018-05-25 DIAGNOSIS — I10 Essential (primary) hypertension: Secondary | ICD-10-CM | POA: Diagnosis not present

## 2018-05-25 DIAGNOSIS — F0390 Unspecified dementia without behavioral disturbance: Secondary | ICD-10-CM | POA: Diagnosis not present

## 2018-05-25 DIAGNOSIS — I69398 Other sequelae of cerebral infarction: Secondary | ICD-10-CM | POA: Diagnosis not present

## 2018-05-25 DIAGNOSIS — E1142 Type 2 diabetes mellitus with diabetic polyneuropathy: Secondary | ICD-10-CM | POA: Diagnosis not present

## 2018-05-25 DIAGNOSIS — M6281 Muscle weakness (generalized): Secondary | ICD-10-CM | POA: Diagnosis not present

## 2018-05-26 ENCOUNTER — Encounter (HOSPITAL_COMMUNITY): Payer: Self-pay | Admitting: *Deleted

## 2018-05-26 ENCOUNTER — Inpatient Hospital Stay (HOSPITAL_COMMUNITY): Payer: Medicare Other

## 2018-05-26 ENCOUNTER — Inpatient Hospital Stay (HOSPITAL_COMMUNITY)
Admission: EM | Admit: 2018-05-26 | Discharge: 2018-05-27 | DRG: 071 | Disposition: A | Discharge status: Skilled Nursing Facility | Payer: Medicare Other | Source: Skilled Nursing Facility | Attending: Family Medicine | Admitting: Family Medicine

## 2018-05-26 ENCOUNTER — Emergency Department (HOSPITAL_COMMUNITY): Payer: Medicare Other

## 2018-05-26 ENCOUNTER — Other Ambulatory Visit: Payer: Self-pay

## 2018-05-26 DIAGNOSIS — I10 Essential (primary) hypertension: Secondary | ICD-10-CM | POA: Diagnosis present

## 2018-05-26 DIAGNOSIS — I69191 Dysphagia following nontraumatic intracerebral hemorrhage: Secondary | ICD-10-CM | POA: Diagnosis not present

## 2018-05-26 DIAGNOSIS — F419 Anxiety disorder, unspecified: Secondary | ICD-10-CM | POA: Diagnosis present

## 2018-05-26 DIAGNOSIS — Z9049 Acquired absence of other specified parts of digestive tract: Secondary | ICD-10-CM

## 2018-05-26 DIAGNOSIS — Z9181 History of falling: Secondary | ICD-10-CM

## 2018-05-26 DIAGNOSIS — E785 Hyperlipidemia, unspecified: Secondary | ICD-10-CM | POA: Diagnosis present

## 2018-05-26 DIAGNOSIS — G9340 Encephalopathy, unspecified: Principal | ICD-10-CM | POA: Diagnosis present

## 2018-05-26 DIAGNOSIS — I69320 Aphasia following cerebral infarction: Secondary | ICD-10-CM | POA: Diagnosis not present

## 2018-05-26 DIAGNOSIS — R402441 Other coma, without documented Glasgow coma scale score, or with partial score reported, in the field [EMT or ambulance]: Secondary | ICD-10-CM | POA: Diagnosis not present

## 2018-05-26 DIAGNOSIS — I6322 Cerebral infarction due to unspecified occlusion or stenosis of basilar arteries: Secondary | ICD-10-CM

## 2018-05-26 DIAGNOSIS — R531 Weakness: Secondary | ICD-10-CM | POA: Diagnosis not present

## 2018-05-26 DIAGNOSIS — R4182 Altered mental status, unspecified: Secondary | ICD-10-CM | POA: Diagnosis not present

## 2018-05-26 DIAGNOSIS — Z888 Allergy status to other drugs, medicaments and biological substances status: Secondary | ICD-10-CM

## 2018-05-26 DIAGNOSIS — I959 Hypotension, unspecified: Secondary | ICD-10-CM

## 2018-05-26 DIAGNOSIS — Z87891 Personal history of nicotine dependence: Secondary | ICD-10-CM | POA: Diagnosis not present

## 2018-05-26 DIAGNOSIS — Z794 Long term (current) use of insulin: Secondary | ICD-10-CM | POA: Diagnosis not present

## 2018-05-26 DIAGNOSIS — E875 Hyperkalemia: Secondary | ICD-10-CM | POA: Diagnosis present

## 2018-05-26 DIAGNOSIS — N179 Acute kidney failure, unspecified: Secondary | ICD-10-CM | POA: Diagnosis present

## 2018-05-26 DIAGNOSIS — M1990 Unspecified osteoarthritis, unspecified site: Secondary | ICD-10-CM | POA: Diagnosis present

## 2018-05-26 DIAGNOSIS — Z9089 Acquired absence of other organs: Secondary | ICD-10-CM

## 2018-05-26 DIAGNOSIS — I63219 Cerebral infarction due to unspecified occlusion or stenosis of unspecified vertebral arteries: Secondary | ICD-10-CM | POA: Diagnosis present

## 2018-05-26 DIAGNOSIS — E872 Acidosis: Secondary | ICD-10-CM | POA: Diagnosis present

## 2018-05-26 DIAGNOSIS — I6789 Other cerebrovascular disease: Secondary | ICD-10-CM | POA: Diagnosis not present

## 2018-05-26 DIAGNOSIS — I639 Cerebral infarction, unspecified: Secondary | ICD-10-CM | POA: Diagnosis present

## 2018-05-26 DIAGNOSIS — E860 Dehydration: Secondary | ICD-10-CM | POA: Diagnosis present

## 2018-05-26 DIAGNOSIS — Z882 Allergy status to sulfonamides status: Secondary | ICD-10-CM | POA: Diagnosis not present

## 2018-05-26 DIAGNOSIS — Z993 Dependence on wheelchair: Secondary | ICD-10-CM | POA: Diagnosis not present

## 2018-05-26 DIAGNOSIS — Z833 Family history of diabetes mellitus: Secondary | ICD-10-CM

## 2018-05-26 DIAGNOSIS — E1142 Type 2 diabetes mellitus with diabetic polyneuropathy: Secondary | ICD-10-CM

## 2018-05-26 DIAGNOSIS — M2550 Pain in unspecified joint: Secondary | ICD-10-CM | POA: Diagnosis not present

## 2018-05-26 DIAGNOSIS — I6381 Other cerebral infarction due to occlusion or stenosis of small artery: Secondary | ICD-10-CM | POA: Diagnosis present

## 2018-05-26 DIAGNOSIS — M21371 Foot drop, right foot: Secondary | ICD-10-CM | POA: Diagnosis present

## 2018-05-26 DIAGNOSIS — E1149 Type 2 diabetes mellitus with other diabetic neurological complication: Secondary | ICD-10-CM | POA: Diagnosis present

## 2018-05-26 DIAGNOSIS — R0902 Hypoxemia: Secondary | ICD-10-CM | POA: Diagnosis not present

## 2018-05-26 DIAGNOSIS — I615 Nontraumatic intracerebral hemorrhage, intraventricular: Secondary | ICD-10-CM

## 2018-05-26 DIAGNOSIS — Z7401 Bed confinement status: Secondary | ICD-10-CM | POA: Diagnosis not present

## 2018-05-26 DIAGNOSIS — J986 Disorders of diaphragm: Secondary | ICD-10-CM | POA: Diagnosis not present

## 2018-05-26 LAB — I-STAT VENOUS BLOOD GAS, ED
ACID-BASE DEFICIT: 5 mmol/L — AB (ref 0.0–2.0)
Bicarbonate: 21.6 mmol/L (ref 20.0–28.0)
O2 SAT: 43 %
PCO2 VEN: 44.1 mmHg (ref 44.0–60.0)
TCO2: 23 mmol/L (ref 22–32)
pH, Ven: 7.297 (ref 7.250–7.430)
pO2, Ven: 27 mmHg — CL (ref 32.0–45.0)

## 2018-05-26 LAB — COMPREHENSIVE METABOLIC PANEL
ALBUMIN: 3.2 g/dL — AB (ref 3.5–5.0)
ALT: 23 U/L (ref 14–54)
ALT: 21 U/L (ref 14–54)
ANION GAP: 12 (ref 5–15)
AST: 49 U/L — ABNORMAL HIGH (ref 15–41)
AST: 39 U/L (ref 15–41)
Albumin: 3.4 g/dL — ABNORMAL LOW (ref 3.5–5.0)
Alkaline Phosphatase: 100 U/L (ref 38–126)
Alkaline Phosphatase: 97 U/L (ref 38–126)
Anion gap: 9 (ref 5–15)
BILIRUBIN TOTAL: 1.1 mg/dL (ref 0.3–1.2)
BUN: 23 mg/dL — ABNORMAL HIGH (ref 6–20)
BUN: 20 mg/dL (ref 6–20)
CHLORIDE: 93 mmol/L — AB (ref 101–111)
CO2: 23 mmol/L (ref 22–32)
CO2: 23 mmol/L (ref 22–32)
CREATININE: 1.41 mg/dL — AB (ref 0.44–1.00)
Calcium: 9.4 mg/dL (ref 8.9–10.3)
Calcium: 8.9 mg/dL (ref 8.9–10.3)
Chloride: 100 mmol/L — ABNORMAL LOW (ref 101–111)
Creatinine, Ser: 1.22 mg/dL — ABNORMAL HIGH (ref 0.44–1.00)
GFR calc Af Amer: 52 mL/min — ABNORMAL LOW (ref >60)
GFR calc non Af Amer: 44 mL/min — ABNORMAL LOW (ref >60)
GFR, EST AFRICAN AMERICAN: 43 mL/min — AB (ref >60)
GFR, EST NON AFRICAN AMERICAN: 37 mL/min — AB (ref >60)
GLUCOSE: 104 mg/dL — AB (ref 65–99)
Glucose, Bld: 141 mg/dL — ABNORMAL HIGH (ref 65–99)
POTASSIUM: 5.7 mmol/L — AB (ref 3.5–5.1)
POTASSIUM: 5 mmol/L (ref 3.5–5.1)
SODIUM: 128 mmol/L — AB (ref 135–145)
Sodium: 132 mmol/L — ABNORMAL LOW (ref 135–145)
TOTAL PROTEIN: 6.4 g/dL — AB (ref 6.5–8.1)
Total Bilirubin: 1.1 mg/dL (ref 0.3–1.2)
Total Protein: 6.9 g/dL (ref 6.5–8.1)

## 2018-05-26 LAB — VALPROIC ACID LEVEL: Valproic Acid Lvl: 36 ug/mL — ABNORMAL LOW (ref 50.0–100.0)

## 2018-05-26 LAB — URINALYSIS, ROUTINE W REFLEX MICROSCOPIC
BILIRUBIN URINE: NEGATIVE
Glucose, UA: NEGATIVE mg/dL
HGB URINE DIPSTICK: NEGATIVE
KETONES UR: 5 mg/dL — AB
Leukocytes, UA: NEGATIVE
NITRITE: NEGATIVE
Protein, ur: NEGATIVE mg/dL
SPECIFIC GRAVITY, URINE: 1.019 (ref 1.005–1.030)
pH: 5 (ref 5.0–8.0)

## 2018-05-26 LAB — CBC WITH DIFFERENTIAL/PLATELET
BASOS PCT: 1 %
Basophils Absolute: 0.1 10*3/uL (ref 0.0–0.1)
EOS ABS: 0.1 10*3/uL (ref 0.0–0.7)
EOS PCT: 1 %
HCT: 40.5 % (ref 36.0–46.0)
Hemoglobin: 14.5 g/dL (ref 12.0–15.0)
LYMPHS ABS: 1.2 10*3/uL (ref 0.7–4.0)
Lymphocytes Relative: 20 %
MCH: 30.3 pg (ref 26.0–34.0)
MCHC: 35.8 g/dL (ref 30.0–36.0)
MCV: 84.6 fL (ref 78.0–100.0)
MONO ABS: 0.5 10*3/uL (ref 0.1–1.0)
Monocytes Relative: 8 %
NEUTROS ABS: 4.2 10*3/uL (ref 1.7–7.7)
NEUTROS PCT: 70 %
PLATELETS: 393 10*3/uL (ref 150–400)
RBC: 4.79 MIL/uL (ref 3.87–5.11)
RDW: 12.3 % (ref 11.5–15.5)
WBC: 6.1 10*3/uL (ref 4.0–10.5)

## 2018-05-26 LAB — GLUCOSE, CAPILLARY: Glucose-Capillary: 145 mg/dL — ABNORMAL HIGH (ref 65–99)

## 2018-05-26 LAB — CBG MONITORING, ED
GLUCOSE-CAPILLARY: 151 mg/dL — AB (ref 65–99)
GLUCOSE-CAPILLARY: 105 mg/dL — AB (ref 65–99)

## 2018-05-26 LAB — I-STAT TROPONIN, ED: TROPONIN I, POC: 0.01 ng/mL (ref 0.00–0.08)

## 2018-05-26 LAB — I-STAT CG4 LACTIC ACID, ED: LACTIC ACID, VENOUS: 2.24 mmol/L — AB (ref 0.5–1.9)

## 2018-05-26 LAB — LACTIC ACID, PLASMA: Lactic Acid, Venous: 1.2 mmol/L (ref 0.5–1.9)

## 2018-05-26 LAB — AMMONIA: AMMONIA: 36 umol/L — AB (ref 9–35)

## 2018-05-26 MED ORDER — ACETAMINOPHEN 650 MG RE SUPP: 650.0000 mg | Freq: Four times a day (QID) | RECTAL | Status: DC | PRN

## 2018-05-26 MED ORDER — SODIUM POLYSTYRENE SULFONATE 15 GM/60ML PO SUSP
45.0000 g | Freq: Once | ORAL | Status: AC
  Administered 2018-05-26: 45 g via RECTAL

## 2018-05-26 MED ORDER — SODIUM CHLORIDE 0.9 % IV SOLN
1.0000 g | Freq: Once | INTRAVENOUS | Status: AC
  Administered 2018-05-26: 1 g via INTRAVENOUS
  Filled 2018-05-26: qty 10

## 2018-05-26 MED ORDER — SODIUM CHLORIDE 0.9 % IV BOLUS
1000.0000 mL | Freq: Once | INTRAVENOUS | Status: AC
  Administered 2018-05-26: 1000 mL via INTRAVENOUS

## 2018-05-26 MED ORDER — ATORVASTATIN CALCIUM 20 MG PO TABS: 20.0000 mg | ORAL_TABLET | Freq: Every day | ORAL | Status: DC

## 2018-05-26 MED ORDER — ACETAMINOPHEN 325 MG PO TABS: 650.0000 mg | ORAL_TABLET | ORAL | Status: DC | PRN

## 2018-05-26 MED ORDER — OMEGA-3-ACID ETHYL ESTERS 1 G PO CAPS: 1.0000 g | ORAL_CAPSULE | Freq: Every day | ORAL | Status: DC

## 2018-05-26 MED ORDER — MAGNESIUM OXIDE 400 (241.3 MG) MG PO TABS: 400.0000 mg | ORAL_TABLET | Freq: Every day | ORAL | Status: DC

## 2018-05-26 MED ORDER — POLYETHYLENE GLYCOL 3350 17 G PO PACK: 17.0000 g | PACK | Freq: Every day | ORAL | Status: DC | PRN

## 2018-05-26 MED ORDER — ONDANSETRON HCL 4 MG/2ML IJ SOLN: 4.0000 mg | Freq: Four times a day (QID) | INTRAMUSCULAR | Status: DC | PRN

## 2018-05-26 MED ORDER — NALOXONE HCL 0.4 MG/ML IJ SOLN
0.4000 mg | Freq: Once | INTRAMUSCULAR | Status: AC
  Administered 2018-05-26: 0.4 mg via INTRAVENOUS
  Filled 2018-05-26: qty 1

## 2018-05-26 MED ORDER — ONDANSETRON HCL 4 MG PO TABS: 4.0000 mg | ORAL_TABLET | Freq: Four times a day (QID) | ORAL | Status: DC | PRN

## 2018-05-26 MED ORDER — SODIUM CHLORIDE 0.9% FLUSH
3.0000 mL | Freq: Two times a day (BID) | INTRAVENOUS | Status: DC
  Administered 2018-05-26: 3 mL via INTRAVENOUS

## 2018-05-26 MED ORDER — SODIUM CHLORIDE 0.9 % IV SOLN
INTRAVENOUS | Status: DC
  Administered 2018-05-26 – 2018-05-27 (×2): via INTRAVENOUS

## 2018-05-26 MED ORDER — SODIUM CHLORIDE 0.9% FLUSH: 3.0000 mL | INTRAVENOUS | Status: DC | PRN

## 2018-05-26 MED ORDER — HEPARIN SODIUM (PORCINE) 5000 UNIT/ML IJ SOLN
5000.0000 [IU] | Freq: Three times a day (TID) | INTRAMUSCULAR | Status: DC
  Administered 2018-05-26 – 2018-05-27 (×2): 5000 [IU] via SUBCUTANEOUS
  Filled 2018-05-26 (×2): qty 1

## 2018-05-26 MED ORDER — SODIUM CHLORIDE 0.9 % IV SOLN: 250.0000 mL | INTRAVENOUS | Status: DC | PRN

## 2018-05-26 MED ORDER — RENA-VITE PO TABS: 1.0000 | ORAL_TABLET | Freq: Every day | ORAL | Status: DC

## 2018-05-26 MED ORDER — INSULIN ASPART 100 UNIT/ML ~~LOC~~ SOLN: 1.0000 [IU] | Freq: Once | SUBCUTANEOUS | Status: DC

## 2018-05-26 MED ORDER — ALBUTEROL SULFATE (2.5 MG/3ML) 0.083% IN NEBU: 2.5000 mg | INHALATION_SOLUTION | RESPIRATORY_TRACT | Status: DC | PRN

## 2018-05-26 MED ORDER — ACETAMINOPHEN 325 MG PO TABS: 650.0000 mg | ORAL_TABLET | Freq: Four times a day (QID) | ORAL | Status: DC | PRN

## 2018-05-26 NOTE — ED Notes (Signed)
Two sets of blood cultures, lavender, dark green, light green, and blue sent down to Main Lab with instructions to hold.  One dark green put on rocker in Mini Lab.

## 2018-05-26 NOTE — ED Notes (Signed)
Delay in lab draw,  Pt currently in MRI 

## 2018-05-26 NOTE — Progress Notes (Addendum)
Patient admit to unit and was able to say her name and where she was.  She was able to follow commands.  She still looks lethargic but was able to communicate with staff.  I will keep monitoring patient.

## 2018-05-26 NOTE — ED Notes (Signed)
Urine culture also sent down to Main Lab with instructions to hold.

## 2018-05-26 NOTE — ED Notes (Signed)
Further delay in lab draw,  Pt receiving peri care at this time.

## 2018-05-26 NOTE — ED Triage Notes (Signed)
Pt from [facility] with reports of weakness with AMS. Baseline pt verbal able to feed self. LKW yesterday am. Pt normally ambulatory with assistance. Today pt had to be fed her breakfast. Appears lethargic upon arrival. BP 95/56, HR 90, CBG 171, RR16, 98% RA. 20G LAC. Hx CVA with L sided deficits.

## 2018-05-26 NOTE — H&P (Signed)
Patient Demographics:    Sheila [name], is a 67 y.o. female  MRN: [id number]   DOB - 1951-03-12  Admit Date - 05/26/2018  Outpatient Primary MD for the patient is Golden Circle, FNP   Assessment & Plan:    Principal Problem:   Encephalopathy acute Active Problems:   Type 2 diabetes mellitus with neurologic complication ([place])   Stroke ([place])   Acute ischemic VBA thalamic stroke ([place])   Intraventricular hemorrhage ([place])    1) encephalopathy/lethargy--- ???  Etiology,  chest x-ray does not suggest pneumonia, UA does not suggest UTI, CT head without acute findings, ED provider ordered Narcan but patient remained unresponsive even after Narcan, Review of patient's MAR from the facility shows the patient has been getting Ativan twice a day, Depakote level is subtherapeutic, ammonia 36. Given history of prior strokes, history of prior intracranial hemorrhage now presenting with significant/profound altered mentation/encephalopathy and lethargy MRI brain was ordered and is pending at the time of this dictation.  No fevers and leukocytosis, blood cultures are pending.  Elevated lactic acid most likely due to dehydration.  Given his significant lethargy we will hold lorazepam, gabapentin, Depakote   2)Hyperkalemia --- potassium is 5.7, calcium chloride and Kayexalate ordered, repeat pending, hold enalapril  3)AKI----      creatinine on admission= 1.4  ,   baseline creatinine = 1.1    ,   Avoid nephrotoxic agents/dehydration/hypotension, hold enalapril, hydrate  4)DM- Allow some permissive Hyperglycemia rather than risk life-threatening hypoglycemia in a patient with unreliable oral intake. Use Novolog/Humalog Sliding scale insulin with Accu-Cheks/Fingersticks as ordered, will hold metformin especially in view of lactic  acidosis, hold Lantus insulin on Trulicity  5)Hypotension/elevated lactic acid--- suspect this is more related to dehydration and decreased tissue perfusion rather than frank sepsis, BP improved with IV fluids repeat lactic acid pending after hydration.  Blood cultures pending.  No evidence of acute infection identified at this time  With History of - Reviewed by me  Past Medical History:  Diagnosis Date  . Anxiety   . Aphasia   . Arthritis   . Diabetes mellitus    complicated by peripheral neuropathy  . Dysphagia following nontraumatic intracerebral hemorrhage   . Hyperlipidemia   . Muscle weakness (generalized)   . Stroke [facility]) 2015   residual right foot drop      Past Surgical History:  Procedure Laterality Date  . CHOLECYSTECTOMY    . TONSILLECTOMY        Chief Complaint  Patient presents with  . Weakness  . Altered Mental Status      HPI:    Sheila [name]  is a 67 y.o. female  with a history of stroke with intraventricular hemorrhage as well as history of prior ischemic strokes with persistent a aphasia, history of hypertension, diabetes, peripheral artery disease and hyperlipidemia who is wheelchair-bound at baseline in a care facility who is arriving today by EMS for altered mental  status of about 24 hours duration.  Typically patient can feed herself however this morning staff how to feed patient with breakfast she will eat about half of it.  No vomiting, no diarrhea  In ED.... Patient is somnolent/lethargic and unable to give any history or follow commands  Most of the history is obtained from ED RN, ED staff, ED provider and available medical records as patient is unable to contribute  In ED chest x-ray does not suggest pneumonia, UA does not suggest UTI, CT head without acute findings  ED provider ordered Narcan but patient remained unresponsive even after Narcan  Review of patient's MAR from the facility shows the patient has been getting Ativan twice a  day,   Depakote level is subtherapeutic, ammonia 36  Given history of prior strokes, history of prior intracranial hemorrhage now presenting with significant/profound altered mentation/encephalopathy and lethargy MRI brain was ordered and is pending at the time of this dictation  Hyperkalemia noted in the ED, calcium chloride and Kayexalate ordered        Review of systems:    In addition to the HPI above,   A full 12 point Review of 10 Systems was done, except as stated above, all other Review of 10 Systems were negative.    Social History:  Reviewed by me    Social History   Tobacco Use  . Smoking status: Former Smoker    Types: Cigarettes    Last attempt to quit: 02/15/1992    Years since quitting: 26.2  . Smokeless tobacco: Never Used  Substance Use Topics  . Alcohol use: No    Alcohol/week: 0.0 oz    Comment: Socially       Family History :  Reviewed by me    Family History  Problem Relation Age of Onset  . Diabetes Mother        Deceased  . Diabetes Brother   . Healthy Son      Home Medications:   Prior to Admission medications   Medication Sig Start Date End Date Taking? Authorizing Provider  acetaminophen (TYLENOL) 325 MG tablet Take 650 mg by mouth every 4 (four) hours as needed for mild pain, moderate pain or headache.   Yes [provider]  Amantadine HCl 100 MG tablet Take 50 mg by mouth 2 (two) times daily.   Yes [provider]  atorvastatin (LIPITOR) 20 MG tablet Take 1 tablet (20 mg total) by mouth daily at 6 PM. 01/10/18  Yes Costello, Kayren Eaves, NP  divalproex (DEPAKOTE) 125 MG DR tablet Take 125 mg by mouth 3 (three) times daily.   Yes [provider]  Dulaglutide (TRULICITY) 1.61 WR/6.0AV SOPN INJECT 0.75 MG INTO THE SKIN SUBCUTANEOUSLY ONCE WEEKLY Patient taking differently: Inject 0.75 mg into the skin every Friday.  06/02/17  Yes Golden Circle, FNP  enalapril (VASOTEC) 5 MG tablet Take 1 tablet (5 mg total)  by mouth 2 (two) times daily. 01/10/18  Yes Costello, Kayren Eaves, NP  fish oil-omega-3 fatty acids 1000 MG capsule Take 1 g by mouth daily.   Yes [provider]  gabapentin (NEURONTIN) 300 MG capsule Take 1 capsule by mouth at  bedtime 12/16/15  Yes Golden Circle, FNP  Insulin Glargine (LANTUS SOLOSTAR) 100 UNIT/ML Solostar Pen Inject 30 Units into the skin daily. Follow-up appt was due back in June must see provider for future refills Patient taking differently: Inject 5 Units into the skin at bedtime.  08/22/17  Yes Calone,  Ples Specter, FNP  LORazepam (ATIVAN) 1 MG tablet Take 1 mg by mouth 2 (two) times daily.   Yes [provider]  magnesium oxide (MAG-OX) 400 (241.3 Mg) MG tablet Take 1 tablet (400 mg total) by mouth daily. 01/10/18  Yes Costello, Kayren Eaves, NP  metFORMIN (GLUCOPHAGE) 500 MG tablet Take 1 tablet (500 mg total) by mouth 2 (two) times daily with a meal. 06/13/17  Yes Golden Circle, FNP  Multiple Vitamin (MULTIVITAMIN WITH MINERALS) TABS tablet Take 1 tablet by mouth daily.   Yes [provider]  potassium chloride (K-DUR) 10 MEQ tablet Take 1 tablet (10 mEq total) by mouth daily. 01/10/18  Yes Costello, Mary A, NP  Insulin Pen Needle 31G X 6 MM MISC Use as needed to inject insulin in the skin. 06/24/17   Golden Circle, FNP     Allergies:     Allergies  Allergen Reactions  . Sulfa Antibiotics Other (See Comments)    As noted on MAR  . Paxil [Paroxetine Hcl] Other (See Comments)    Dreams/ nightmares  . Iopamidol Rash     Physical Exam:   Vitals  Blood pressure (!) 108/59, pulse 87, temperature 97.8 F (36.6 C), temperature source Rectal, resp. rate 16, height 5\' 2"  (1.575 m), weight 74.8 kg (165 lb), SpO2 100 %.  Physical Examination: General appearance -very lethargic, barely able to keep her eyes open  mental status -encephalopathic/lethargic/somnolent Eyes - sclera anicteric Neck - supple, no JVD elevation , Chest - clear  to  auscultation bilaterally, symmetrical air movement,  Heart - S1 and S2 normal,  Abdomen - soft, nontender, nondistended, no masses or organomegaly Neurological -neuro exam is very limited due to patient being encephalopathic/lethargic/somnolent, she is unable to follow commands and answer questions  extremities - no pedal edema noted, intact peripheral pulses  Skin - warm, dry     Data Review:    CBC Recent Labs  Lab 05/26/18 1219  WBC 6.1  HGB 14.5  HCT 40.5  PLT 393  MCV 84.6  MCH 30.3  MCHC 35.8  RDW 12.3  LYMPHSABS 1.2  MONOABS 0.5  EOSABS 0.1  BASOSABS 0.1   ------------------------------------------------------------------------------------------------------------------  Chemistries  Recent Labs  Lab 05/26/18 1219  NA 128*  K 5.7*  CL 93*  CO2 23  GLUCOSE 141*  BUN 23*  CREATININE 1.41*  CALCIUM 9.4  AST 49*  ALT 23  ALKPHOS 100  BILITOT 1.1   ------------------------------------------------------------------------------------------------------------------ estimated creatinine clearance is 36.2 mL/min (A) (by C-G formula based on SCr of 1.41 mg/dL (H)). ------------------------------------------------------------------------------------------------------------------ No results for input(s): TSH, T4TOTAL, T3FREE, THYROIDAB in the last 72 hours.  Invalid input(s): FREET3   Coagulation profile No results for input(s): INR, PROTIME in the last 168 hours. ------------------------------------------------------------------------------------------------------------------- No results for input(s): DDIMER in the last 72 hours. -------------------------------------------------------------------------------------------------------------------  Cardiac Enzymes No results for input(s): CKMB, TROPONINI, MYOGLOBIN in the last 168 hours.  Invalid input(s):  CK ------------------------------------------------------------------------------------------------------------------ No results found for: BNP   ---------------------------------------------------------------------------------------------------------------  Urinalysis    Component Value Date/Time   COLORURINE AMBER (A) 05/26/2018 1219   APPEARANCEUR HAZY (A) 05/26/2018 1219   LABSPEC 1.019 05/26/2018 1219   PHURINE 5.0 05/26/2018 1219   GLUCOSEU NEGATIVE 05/26/2018 1219   HGBUR NEGATIVE 05/26/2018 1219   BILIRUBINUR NEGATIVE 05/26/2018 1219   BILIRUBINUR Neg 01/22/2014 1656   KETONESUR 5 (A) 05/26/2018 1219   PROTEINUR NEGATIVE 05/26/2018 1219   UROBILINOGEN 0.2 01/22/2014 1656   NITRITE NEGATIVE 05/26/2018 1219  LEUKOCYTESUR NEGATIVE 05/26/2018 1219    ----------------------------------------------------------------------------------------------------------------   Imaging Results:    Ct Head Wo Contrast  Result Date: 05/26/2018 CLINICAL DATA:  Altered mental status today.  Weakness. EXAM: CT HEAD WITHOUT CONTRAST TECHNIQUE: Contiguous axial images were obtained from the base of the skull through the vertex without intravenous contrast. COMPARISON:  Head CT scan 05/07/2018.  Brain MRI 01/05/2018. FINDINGS: Brain: No evidence of acute infarction, hemorrhage, hydrocephalus, extra-axial collection or mass lesion/mass effect. Atrophy and extensive chronic microvascular ischemic change are seen as on the prior studies. Vascular: Atherosclerosis noted. Skull: Intact.  No focal lesion. Sinuses/Orbits: Negative. Other: None. IMPRESSION: No acute abnormality. Atrophy and extensive chronic microvascular ischemic change. Atherosclerosis. Electronically Signed   By: Inge Rise M.D.   On: 05/26/2018 13:18   Dg Chest Port 1 View  Result Date: 05/26/2018 CLINICAL DATA:  Altered mental status EXAM: PORTABLE CHEST 1 VIEW COMPARISON:  January 09, 2018 FINDINGS: There is slight elevation of  the left hemidiaphragm, stable. There is no edema or consolidation. Heart size and pulmonary vascularity are normal. No adenopathy. No bone lesions. IMPRESSION: No edema or consolidation.  Stable cardiac silhouette. Electronically Signed   By: Lowella Grip III M.D.   On: 05/26/2018 13:32    Radiological Exams on Admission: Ct Head Wo Contrast  Result Date: 05/26/2018 CLINICAL DATA:  Altered mental status today.  Weakness. EXAM: CT HEAD WITHOUT CONTRAST TECHNIQUE: Contiguous axial images were obtained from the base of the skull through the vertex without intravenous contrast. COMPARISON:  Head CT scan 05/07/2018.  Brain MRI 01/05/2018. FINDINGS: Brain: No evidence of acute infarction, hemorrhage, hydrocephalus, extra-axial collection or mass lesion/mass effect. Atrophy and extensive chronic microvascular ischemic change are seen as on the prior studies. Vascular: Atherosclerosis noted. Skull: Intact.  No focal lesion. Sinuses/Orbits: Negative. Other: None. IMPRESSION: No acute abnormality. Atrophy and extensive chronic microvascular ischemic change. Atherosclerosis. Electronically Signed   By: Inge Rise M.D.   On: 05/26/2018 13:18   Dg Chest Port 1 View  Result Date: 05/26/2018 CLINICAL DATA:  Altered mental status EXAM: PORTABLE CHEST 1 VIEW COMPARISON:  January 09, 2018 FINDINGS: There is slight elevation of the left hemidiaphragm, stable. There is no edema or consolidation. Heart size and pulmonary vascularity are normal. No adenopathy. No bone lesions. IMPRESSION: No edema or consolidation.  Stable cardiac silhouette. Electronically Signed   By: Lowella Grip III M.D.   On: 05/26/2018 13:32    DVT Prophylaxis -SCD  /heaprin AM Labs Ordered, also please review Full Orders  Family Communication: Admission, patients condition and plan of care including tests being ordered have been discussed with staff who indicate understanding and agree with the plan   Code Status - Full  Code  Likely DC to  SNF  Condition   Stable   Roxan Hockey M.D on 05/26/2018 at 7:34 PM   Between 7am to 7pm - Pager - [phone] After 7pm go to [email or web address] - password [email or web address]  [facility] - Office  [phone]  Voice Recognition Viviann Spare dictation system was used to create this note, attempts have been made to correct errors. Please contact the author with questions and/or clarifications.

## 2018-05-26 NOTE — ED Provider Notes (Signed)
[facility] EMERGENCY DEPARTMENT Provider Note   CSN: [id number] Arrival date & time: 05/26/18  1156     History   Chief Complaint Chief Complaint  Patient presents with  . Weakness  . Altered Mental Status    HPI Kiylah Loyer Kloey Cazarez is a 67 y.o. female.  Patient is a 67 year old female with a history of stroke with intraventricular hemorrhage with persistent a aphasia, hypertension, diabetes, peripheral artery disease and hyperlipidemia who is wheelchair-bound in a care facility who is arriving today by EMS for altered mental status.  The facility reported yesterday she seemed more somnolent and not quite herself but today was much worse.  They did feed her 50% of her breakfast this morning but she normally is able to eat on her own.  They report that patient is globally weak and just not acting herself.  They denied any shortness of breath, nausea or vomiting.  Patient denies feeling short of breath or having chest pain.  She denies any abdominal pain.  However she continues to go to sleep on exam.  The history is provided by the EMS personnel and the nursing home. The history is limited by the condition of the patient.    Past Medical History:  Diagnosis Date  . Anxiety   . Aphasia   . Arthritis   . Diabetes mellitus    complicated by peripheral neuropathy  . Dysphagia following nontraumatic intracerebral hemorrhage   . Hyperlipidemia   . Muscle weakness (generalized)   . Stroke [facility]) 2015   residual right foot drop    Patient Active Problem List   Diagnosis Date Noted  . Intraventricular hemorrhage ([place]) 01/05/2018  . Hypertensive crisis   . Lethargic 05/05/2017  . Low blood sugar 05/05/2017  . Nail abnormalities 12/31/2016  . Memory changes 09/16/2016  . Multiple falls 09/16/2016  . UTI (urinary tract infection) 02/28/2016  . Depression 02/28/2016  . Acute ischemic VBA thalamic stroke ([place])   . Stroke (cerebrum) ([place])   . Anomia 02/17/2016  .  Stroke ([place]) 02/17/2016  . Peripheral arterial disease ([place]) 12/03/2015  . Neuropathy 09/05/2015  . Medicare annual wellness visit, subsequent 03/12/2015  . Routine general medical examination at a health care facility 10/08/2014  . Cerumen impaction 10/08/2014  . Low back pain potentially associated with radiculopathy 03/01/2013  . Hyperlipidemia 02/15/2012  . Type 2 diabetes mellitus with neurologic complication ([place]) 70/48/8891    Past Surgical History:  Procedure Laterality Date  . CHOLECYSTECTOMY    . TONSILLECTOMY       OB History   None      Home Medications    Prior to Admission medications   Medication Sig Start Date End Date Taking? Authorizing Provider  acetaminophen (TYLENOL) 325 MG tablet Take 650 mg by mouth every 4 (four) hours as needed for mild pain, moderate pain or headache.    [provider]  Amantadine HCl 100 MG tablet Take 50 mg by mouth 2 (two) times daily.    [provider]  atorvastatin (LIPITOR) 20 MG tablet Take 1 tablet (20 mg total) by mouth daily at 6 PM. 01/10/18   Mary Sella, NP  divalproex (DEPAKOTE) 125 MG DR tablet Take 125 mg by mouth 3 (three) times daily.    [provider]  Dulaglutide (TRULICITY) 6.94 HW/3.8UE SOPN INJECT 0.75 MG INTO THE SKIN SUBCUTANEOUSLY ONCE WEEKLY Patient taking differently: Inject 0.75 mg into the skin once a week. INJECT 0.75 MG INTO THE SKIN  SUBCUTANEOUSLY ONCE WEEKLY 06/02/17   Golden Circle, FNP  enalapril (VASOTEC) 5 MG tablet Take 1 tablet (5 mg total) by mouth 2 (two) times daily. 01/10/18   Mary Sella, NP  fish oil-omega-3 fatty acids 1000 MG capsule Take 1 g by mouth daily.    [provider]  gabapentin (NEURONTIN) 300 MG capsule Take 1 capsule by mouth at  bedtime 12/16/15   Golden Circle, FNP  Insulin Glargine (LANTUS SOLOSTAR) 100 UNIT/ML Solostar Pen Inject 30 Units into the skin daily. Follow-up appt was due back in June must see provider for future  refills Patient taking differently: Inject 5 Units into the skin daily.  08/22/17   Golden Circle, FNP  Insulin Pen Needle 31G X 6 MM MISC Use as needed to inject insulin in the skin. 06/24/17   Golden Circle, FNP  LORazepam (ATIVAN) 1 MG tablet Take 1 mg by mouth 2 (two) times daily.    [provider]  magnesium oxide (MAG-OX) 400 (241.3 Mg) MG tablet Take 1 tablet (400 mg total) by mouth daily. 01/10/18   Mary Sella, NP  metFORMIN (GLUCOPHAGE) 500 MG tablet Take 1 tablet (500 mg total) by mouth 2 (two) times daily with a meal. 06/13/17   Golden Circle, FNP  Multiple Vitamin (DAILY-VITE) TABS Take 1 tablet by mouth every morning.    [provider]  potassium chloride (K-DUR) 10 MEQ tablet Take 1 tablet (10 mEq total) by mouth daily. 01/10/18   Mary Sella, NP    Family History Family History  Problem Relation Age of Onset  . Diabetes Mother        Deceased  . Diabetes Brother   . Healthy Son     Social History Social History   Tobacco Use  . Smoking status: Former Smoker    Types: Cigarettes    Last attempt to quit: 02/15/1992    Years since quitting: 26.2  . Smokeless tobacco: Never Used  Substance Use Topics  . Alcohol use: No    Alcohol/week: 0.0 oz    Comment: Socially  . Drug use: No    Types: Marijuana    Comment: SMOKES IT ANY TIME SHE CAN GET IT     Allergies   Sulfa antibiotics; Paxil [paroxetine hcl]; and Iopamidol   Review of Systems Review of Systems  Unable to perform ROS: Mental status change     Physical Exam Updated Vital Signs BP (!) 80/57   Temp 97.8 F (36.6 C) (Rectal)   Resp 15   Physical Exam  Constitutional: She appears well-developed and well-nourished. She appears lethargic. No distress.  Patient is somnolent but will open her eyes and give short answers before she goes back to sleep  HENT:  Head: Normocephalic and atraumatic.  Mouth/Throat: Oropharynx is clear and moist.  Mucous membranes are  dry and food still present in the mouth  Eyes: Pupils are equal, round, and reactive to light. Conjunctivae and EOM are normal.  Neck: Normal range of motion. Neck supple.  Cardiovascular: Normal rate, regular rhythm and intact distal pulses.  No murmur heard. Pulmonary/Chest: Effort normal and breath sounds normal. No respiratory distress. She has no wheezes. She has no rales.  Abdominal: Soft. She exhibits no distension. There is no tenderness. There is no rebound and no guarding.  Musculoskeletal: Normal range of motion. She exhibits no edema or tenderness.  Neurological: She appears lethargic.  Patient is not following commands.  She is not  moving extremities at request.  She does respond to questions appropriately.  Skin: Skin is warm and dry. No rash noted. No erythema.  Psychiatric:  Altered mental status  Nursing note and vitals reviewed.    ED Treatments / Results  Labs (all labs ordered are listed, but only abnormal results are displayed) Labs Reviewed  COMPREHENSIVE METABOLIC PANEL - Abnormal; Notable for the following components:      Result Value   Sodium 128 (*)    Potassium 5.7 (*)    Chloride 93 (*)    Glucose, Bld 141 (*)    BUN 23 (*)    Creatinine, Ser 1.41 (*)    Albumin 3.4 (*)    AST 49 (*)    GFR calc non Af Amer 37 (*)    GFR calc Af Amer 43 (*)    All other components within normal limits  URINALYSIS, ROUTINE W REFLEX MICROSCOPIC - Abnormal; Notable for the following components:   Color, Urine AMBER (*)    APPearance HAZY (*)    Ketones, ur 5 (*)    All other components within normal limits  VALPROIC ACID LEVEL - Abnormal; Notable for the following components:   Valproic Acid Lvl 36 (*)    All other components within normal limits  CBG MONITORING, ED - Abnormal; Notable for the following components:   Glucose-Capillary 151 (*)    All other components within normal limits  I-STAT CG4 LACTIC ACID, ED - Abnormal; Notable for the following components:    Lactic Acid, Venous 2.24 (*)    All other components within normal limits  I-STAT VENOUS BLOOD GAS, ED - Abnormal; Notable for the following components:   pO2, Ven 27.0 (*)    Acid-base deficit 5.0 (*)    All other components within normal limits  CBC WITH DIFFERENTIAL/PLATELET  AMMONIA  I-STAT TROPONIN, ED    EKG EKG Interpretation  Date/Time:  Friday May 26 2018 12:10:26 EDT Ventricular Rate:  94 PR Interval:    QRS Duration: 89 QT Interval:  324 QTC Calculation: 406 R Axis:   19 Text Interpretation:  Sinus rhythm Minimal ST elevation, inferior leads Baseline wander No significant change since last tracing Confirmed by Blanchie Dessert [id number]) on 05/26/2018 1:28:01 PM   Radiology Ct Head Wo Contrast  Result Date: 05/26/2018 CLINICAL DATA:  Altered mental status today.  Weakness. EXAM: CT HEAD WITHOUT CONTRAST TECHNIQUE: Contiguous axial images were obtained from the base of the skull through the vertex without intravenous contrast. COMPARISON:  Head CT scan 05/07/2018.  Brain MRI 01/05/2018. FINDINGS: Brain: No evidence of acute infarction, hemorrhage, hydrocephalus, extra-axial collection or mass lesion/mass effect. Atrophy and extensive chronic microvascular ischemic change are seen as on the prior studies. Vascular: Atherosclerosis noted. Skull: Intact.  No focal lesion. Sinuses/Orbits: Negative. Other: None. IMPRESSION: No acute abnormality. Atrophy and extensive chronic microvascular ischemic change. Atherosclerosis. Electronically Signed   By: Inge Rise M.D.   On: 05/26/2018 13:18   Dg Chest Port 1 View  Result Date: 05/26/2018 CLINICAL DATA:  Altered mental status EXAM: PORTABLE CHEST 1 VIEW COMPARISON:  January 09, 2018 FINDINGS: There is slight elevation of the left hemidiaphragm, stable. There is no edema or consolidation. Heart size and pulmonary vascularity are normal. No adenopathy. No bone lesions. IMPRESSION: No edema or consolidation.  Stable cardiac  silhouette. Electronically Signed   By: Lowella Grip III M.D.   On: 05/26/2018 13:32    Procedures Procedures (including critical care time)  Medications Ordered in  ED Medications  sodium chloride 0.9 % bolus 1,000 mL (has no administration in time range)     Initial Impression / Assessment and Plan / ED Course  I have reviewed the triage vital signs and the nursing notes.  Pertinent labs & imaging results that were available during my care of the patient were reviewed by me and considered in my medical decision making (see chart for details).     Patient with multiple medical problems presenting today with altered mental status, hypotension and no acute distress.  Patient was seen approximately 2 weeks ago at our facility after a mechanical fall.  At that time she had a detailed physical exam which states she was able to move all extremities and do finger-to-nose without difficulty.  Today patient is not moving any of her extremities on command however this seems to be a more global issue related to somnolence and lethargy.  Patient is afebrile here but is hypotensive.  Lactic acid is mildly elevated at 2.24.  There was no report of cough, shortness of breath, abdominal pain, nausea vomiting or diarrhea.  Concern for possible underlying infection however concern for possible medication effects, intracranial hemorrhage as patient does have a history of multiple mechanical falls.  It currently pupils are reactive and patient does respond to questions before going back to sleep.  She has no history of COPD or lung pathology that would be concerning for hypercapnia.  Blood sugar is 151.  Looking at patient's medication list Ativan is the only mind altering medication that she has been on since March.  Unclear if she is received this medication today. CBC, CMP, troponin, Depakote level, UA, chest x-ray, head CT, ammonia present.  Patient given IV fluids.  3:08 PM Lab work is relatively  unremarkable with a VBG with normal CO2, negative troponin, CBC within normal limits, CMP with mild AKI with creatinine 1.41 and mild elevation of potassium of 5.7.  UA negative for signs of infection.  Patient is afebrile here via rectal temp.  Blood pressure improved after IV fluids.  Patient's ammonia level is still pending but no history of liver disease.  Concerned that this is medication related.  Patient was given Narcan without change.  Will admit for AMS.  Final Clinical Impressions(s) / ED Diagnoses   Final diagnoses:  Altered mental status, unspecified altered mental status type  Hypotension, unspecified hypotension type  AKI (acute kidney injury) [facility])    ED Discharge Orders    None       Blanchie Dessert, MD 05/26/18 1547

## 2018-05-26 NOTE — ED Notes (Signed)
Pt remains somnolent despite administration of narcan. MD notified.

## 2018-05-26 NOTE — ED Notes (Signed)
Pt back from CT

## 2018-05-26 NOTE — ED Notes (Signed)
Patient transported to MRI 

## 2018-05-26 NOTE — ED Notes (Signed)
Spoke with admitting MD regarding pt BP. Admitting MD states pt is stable for Telemetry.

## 2018-05-27 ENCOUNTER — Inpatient Hospital Stay (HOSPITAL_COMMUNITY): Payer: Medicare Other

## 2018-05-27 DIAGNOSIS — G9340 Encephalopathy, unspecified: Principal | ICD-10-CM

## 2018-05-27 LAB — CBC
HCT: 33.6 % — ABNORMAL LOW (ref 36.0–46.0)
HEMOGLOBIN: 11.8 g/dL — AB (ref 12.0–15.0)
MCH: 29.9 pg (ref 26.0–34.0)
MCHC: 35.1 g/dL (ref 30.0–36.0)
MCV: 85.3 fL (ref 78.0–100.0)
PLATELETS: 350 10*3/uL (ref 150–400)
RBC: 3.94 MIL/uL (ref 3.87–5.11)
RDW: 12.3 % (ref 11.5–15.5)
WBC: 5.2 10*3/uL (ref 4.0–10.5)

## 2018-05-27 LAB — BASIC METABOLIC PANEL
ANION GAP: 10 (ref 5–15)
BUN: 15 mg/dL (ref 6–20)
CALCIUM: 9.5 mg/dL (ref 8.9–10.3)
CO2: 20 mmol/L — AB (ref 22–32)
CREATININE: 0.91 mg/dL (ref 0.44–1.00)
Chloride: 104 mmol/L (ref 101–111)
GLUCOSE: 83 mg/dL (ref 65–99)
Potassium: 4.5 mmol/L (ref 3.5–5.1)
Sodium: 134 mmol/L — ABNORMAL LOW (ref 135–145)

## 2018-05-27 LAB — HIV ANTIBODY (ROUTINE TESTING W REFLEX): HIV SCREEN 4TH GENERATION: NONREACTIVE

## 2018-05-27 LAB — GLUCOSE, CAPILLARY
GLUCOSE-CAPILLARY: 87 mg/dL (ref 65–99)
GLUCOSE-CAPILLARY: 57 mg/dL — AB (ref 65–99)

## 2018-05-27 MED ORDER — INSULIN GLARGINE 100 UNIT/ML SOLOSTAR PEN: 5.0000 [IU] | PEN_INJECTOR | Freq: Every day | SUBCUTANEOUS | 0 refills | Status: DC

## 2018-05-27 MED ORDER — LORAZEPAM 0.5 MG PO TABS: 0.5000 mg | ORAL_TABLET | Freq: Two times a day (BID) | ORAL | 0 refills | Status: DC | PRN

## 2018-05-27 MED ORDER — DULAGLUTIDE 0.75 MG/0.5ML ~~LOC~~ SOAJ: 0.7500 mg | SUBCUTANEOUS | 0 refills | Status: DC

## 2018-05-27 NOTE — NC FL2 (Signed)
[place] LEVEL OF CARE SCREENING TOOL     IDENTIFICATION  Patient Name: Sheila [name] Birthdate: 07/18/1951 Sex: female Admission Date (Current Location): 05/26/2018  [facility] and [place] Number:  Herbalist and Address:  [facility], [place] [address] [place], [place] [address]      Provider Number: [id number]  Attending Physician Name and Address:  Roxan Hockey, MD  Relative Name and Phone Number:       Current Level of Care: Hospital Recommended Level of Care: [place] Prior Approval Number:    Date Approved/Denied:   PASRR Number:    Discharge Plan: Other (Comment)([name])    Current Diagnoses: Patient Active Problem List   Diagnosis Date Noted  . Encephalopathy acute 05/26/2018  . Intraventricular hemorrhage ([place]) 01/05/2018  . Hypertensive crisis   . Lethargic 05/05/2017  . Low blood sugar 05/05/2017  . Nail abnormalities 12/31/2016  . Memory changes 09/16/2016  . Multiple falls 09/16/2016  . UTI (urinary tract infection) 02/28/2016  . Depression 02/28/2016  . Acute ischemic VBA thalamic stroke ([place])   . Stroke (cerebrum) ([place])   . Anomia 02/17/2016  . Stroke ([place]) 02/17/2016  . Peripheral arterial disease ([place]) 12/03/2015  . Neuropathy 09/05/2015  . Medicare annual wellness visit, subsequent 03/12/2015  . Routine general medical examination at a health care facility 10/08/2014  . Cerumen impaction 10/08/2014  . Low back pain potentially associated with radiculopathy 03/01/2013  . Hyperlipidemia 02/15/2012  . Type 2 diabetes mellitus with neurologic complication ([place]) 76/16/0737    Orientation RESPIRATION BLADDER Height & Weight     Self, Place  Normal Incontinent Weight: 161 lb 2.5 oz (73.1 kg) Height:  5\' 3"  (160 cm)  BEHAVIORAL SYMPTOMS/MOOD NEUROLOGICAL BOWEL NUTRITION STATUS      Incontinent Diet(carb modified)  AMBULATORY STATUS COMMUNICATION OF NEEDS Skin   Extensive Assist  Verbally Normal                       Personal Care Assistance Level of Assistance  Bathing, Feeding, Dressing Bathing Assistance: Limited assistance Feeding assistance: Limited assistance Dressing Assistance: Limited assistance     Functional Limitations Info  Sight, Hearing, Speech Sight Info: Adequate Hearing Info: Adequate Speech Info: Adequate    SPECIAL CARE FACTORS FREQUENCY                       Contractures Contractures Info: Not present    Additional Factors Info  Code Status, Allergies Code Status Info: Full Allergies Info: Sulfa Antibiotics, Paxil Paroxetine Hcl, Iopamidol           Current Medications (05/27/2018):  This is the current hospital active medication list Current Facility-Administered Medications  Medication Dose Route Frequency Provider Last Rate Last Dose  . 0.9 %  sodium chloride infusion   Intravenous Continuous Emokpae, Courage, MD 150 mL/hr at 05/27/18 0530    . 0.9 %  sodium chloride infusion  250 mL Intravenous PRN Emokpae, Courage, MD      . acetaminophen (TYLENOL) tablet 650 mg  650 mg Oral Q6H PRN Emokpae, Courage, MD       Or  . acetaminophen (TYLENOL) suppository 650 mg  650 mg Rectal Q6H PRN Emokpae, Courage, MD      . albuterol (PROVENTIL) (2.5 MG/3ML) 0.083% nebulizer solution 2.5 mg  2.5 mg Nebulization Q2H PRN Emokpae, Courage, MD      . atorvastatin (LIPITOR) tablet 20 mg  20 mg Oral q1800 Emokpae, Courage, MD      . heparin injection 5,000 Units  5,000 Units Subcutaneous Q8H Emokpae, Courage, MD   5,000 Units at 05/27/18 0530  . insulin aspart (novoLOG) injection 1 Units  1 Units Subcutaneous Once Emokpae, Courage, MD      . magnesium oxide (MAG-OX) tablet 400 mg  400 mg Oral Daily Emokpae, Courage, MD      . multivitamin (RENA-VIT) tablet 1 tablet  1 tablet Oral QHS Emokpae, Courage, MD      . omega-3 acid ethyl esters (LOVAZA) capsule 1 g  1 g Oral Daily Emokpae, Courage, MD      . ondansetron (ZOFRAN) tablet 4 mg   4 mg Oral Q6H PRN Emokpae, Courage, MD       Or  . ondansetron (ZOFRAN) injection 4 mg  4 mg Intravenous Q6H PRN Emokpae, Courage, MD      . polyethylene glycol (MIRALAX / GLYCOLAX) packet 17 g  17 g Oral Daily PRN Emokpae, Courage, MD      . sodium chloride flush (NS) 0.9 % injection 3 mL  3 mL Intravenous Q12H Emokpae, Courage, MD   3 mL at 05/26/18 2249  . sodium chloride flush (NS) 0.9 % injection 3 mL  3 mL Intravenous PRN Roxan Hockey, MD         Discharge Medications: Please see discharge summary for a list of discharge medications.  Relevant Imaging Results:  Relevant Lab Results:   Additional Information SS#: [id number]  Geralynn Ochs, [place]

## 2018-05-27 NOTE — Procedures (Signed)
History: 67 year old female being evaluated for encephalopathy  Sedation: None  Technique: This is a 21 channel routine scalp EEG performed at the bedside with bipolar and monopolar montages arranged in accordance to the international 10/20 system of electrode placement. One channel was dedicated to EKG recording.    Background: There is a posterior dominant rhythm which is moderately well organized achieving a  Frequency of 7 to 8 Hz.  In addition, there is diffuse generalized irregular delta and theta activities throughout the study.  Sleep is not recorded.  Photic stimulation: Physiologic driving is none  EEG Abnormalities: 1) generalized irregular slow activity  Clinical Interpretation: This EEG is consistent with a generalized nonspecific cerebral dysfunction (encephalopathy). There was no seizure or seizure predisposition recorded on this study. Please note that a normal EEG does not preclude the possibility of epilepsy.   Roland Rack, MD [facility] [phone]  If 7pm- 7am, please page neurology on call as listed in [place].

## 2018-05-27 NOTE — Progress Notes (Signed)
EEG completed, results pending. 

## 2018-05-27 NOTE — Discharge Summary (Signed)
Sheila [name], is a 67 y.o. female  DOB [birth year]  MRN [id number].  Admission date:  05/26/2018  Admitting Physician  Roxan Hockey, MD  Discharge Date:  05/27/2018   Primary MD  Golden Circle, FNP  Recommendations for primary care physician for things to follow:   1)Lorazepam has been reduced to 0.5 mg twice a day as needed due to concerns about excessive lethargy  2) repeat BMP test on Tuesday, 05/30/2018--- if renal function and bicarb are normal then consider resuming metformin 3) work-up shows no evidence of new strokes or seizures   Admission Diagnosis  AKI (acute kidney injury) ([place]) [N17.9] Hypotension, unspecified hypotension type [I95.9] Altered mental status, unspecified altered mental status type [R41.82]   Discharge Diagnosis  AKI (acute kidney injury) ([place]) [N17.9] Hypotension, unspecified hypotension type [I95.9] Altered mental status, unspecified altered mental status type [R41.82]    Principal Problem:   Encephalopathy acute Active Problems:   Type 2 diabetes mellitus with neurologic complication ([place])   Stroke ([place])   Acute ischemic VBA thalamic stroke ([place])   Intraventricular hemorrhage ([place])      Past Medical History:  Diagnosis Date  . Anxiety   . Aphasia   . Arthritis   . Diabetes mellitus    complicated by peripheral neuropathy  . Dysphagia following nontraumatic intracerebral hemorrhage   . Hyperlipidemia   . Muscle weakness (generalized)   . Stroke [facility]) 2015   residual right foot drop    Past Surgical History:  Procedure Laterality Date  . CHOLECYSTECTOMY    . TONSILLECTOMY         HPI  from the history and physical done on the day of admission:     Sheila [name]  is a 67 y.o. female  with a history of stroke with intraventricular hemorrhage as well as history of prior ischemic strokes with persistent a aphasia, history of hypertension, diabetes,  peripheral artery disease and hyperlipidemia who is wheelchair-bound at baseline in a care facility who is arriving today by EMS for altered mental status of about 24 hours duration.  Typically patient can feed herself however this morning staff how to feed patient with breakfast she will eat about half of it.  No vomiting, no diarrhea  In ED.... Patient is somnolent/lethargic and unable to give any history or follow commands  Most of the history is obtained from ED RN, ED staff, ED provider and available medical records as patient is unable to contribute  In ED chest x-ray does not suggest pneumonia, UA does not suggest UTI, CT head without acute findings  ED provider ordered Narcan but patient remained unresponsive even after Narcan  Review of patient's MAR from the facility shows the patient has been getting Ativan twice a day,   Depakote level is subtherapeutic, ammonia 36  Given history of prior strokes, history of prior intracranial hemorrhage now presenting with significant/profound altered mentation/encephalopathy and lethargy MRI brain was ordered and is pending at the time of this dictation  Hyperkalemia noted in the  ED, calcium chloride and Kayexalate ordered       Hospital Course:     1) encephalopathy/lethargy--- overall patient is back to baseline, awake, following commands, answering questions, oriented x2, initial lethargy on admission was most likely due to lorazepam use at the nursing home patient was getting lorazepam 1 mg twice daily scheduled, we will change this to lorazepam 0.5 mg twice daily as needed,   chest x-ray does not suggest pneumonia, UA does not suggest UTI, CT head without acute findings, MRI brain without acute findings, patient does have old stroke ED provider ordered Narcan but patient remained unresponsive even after Narcan in the ED, Review of patient's MAR from the facility shows the patient has been getting Ativan twice a day, Depakote  level is subtherapeutic, ammonia 36.   No fevers and leukocytosis, blood cultures are pending.  Elevated lactic acid most likely due to dehydration, lactic acid normalized with IV fluids.    2)Hyperkalemia --- potassium is 5.7, calcium chloride and Kayexalate ordered, repeat potassium is 4.5,  continue to hold enalapril  3)AKI----      creatinine on admission= 1.4  ,   baseline creatinine = 1.1    , creatinine is now down to 0.9,  Avoid nephrotoxic agents/dehydration/hypotension, hold enalapril, hydrate  4)DM- episode of hypoglycemia while n.p.o., may resume Lantus and Trulicity as oral intake is resumed, continue to hold metformin until repeat BMP on 05/30/2018  5)Hypotension/elevated lactic acid--- suspect this is more related to dehydration and decreased tissue perfusion rather than frank sepsis, BP improved with IV fluids repeat lactic acid normalized after hydration.    No evidence of acute infection identified at this time  Discharge Condition: stable  Follow UP  Contact information for after-discharge care    Destination    [name] .   Service:  Assisted Living Contact information: [address] [place] [phone]               Diet and Activity recommendation:  As advised  Discharge Instructions    Discharge Instructions    Call MD for:  difficulty breathing, headache or visual disturbances   Complete by:  As directed    Call MD for:  persistant dizziness or light-headedness   Complete by:  As directed    Call MD for:  persistant nausea and vomiting   Complete by:  As directed    Call MD for:  severe uncontrolled pain   Complete by:  As directed    Call MD for:  temperature >100.4   Complete by:  As directed    Diet - low sodium heart healthy   Complete by:  As directed    Diet Carb Modified   Complete by:  As directed    Discharge instructions   Complete by:  As directed    1) lorazepam has been reduced  to 0.5 mg twice a day as needed due to concerns about excessive lethargy  2) repeat BMP test on Tuesday, 05/30/2018--- if renal function and bicarb are normal then consider resuming metformin 3) work-up shows no evidence of new strokes or seizures   Increase activity slowly   Complete by:  As directed         Discharge Medications     Allergies as of 05/27/2018      Reactions   Sulfa Antibiotics Other (See Comments)   As noted on MAR   Paxil [paroxetine Hcl] Other (See Comments)   Dreams/ nightmares  Iopamidol Rash      Medication List    STOP taking these medications   enalapril 5 MG tablet Commonly known as:  VASOTEC   metFORMIN 500 MG tablet Commonly known as:  GLUCOPHAGE     TAKE these medications   acetaminophen 325 MG tablet Commonly known as:  TYLENOL Take 650 mg by mouth every 4 (four) hours as needed for mild pain, moderate pain or headache.   Amantadine HCl 100 MG tablet Take 50 mg by mouth 2 (two) times daily.   atorvastatin 20 MG tablet Commonly known as:  LIPITOR Take 1 tablet (20 mg total) by mouth daily at 6 PM. What changed:  when to take this   divalproex 125 MG DR tablet Commonly known as:  DEPAKOTE Take 125 mg by mouth 3 (three) times daily with meals.   Dulaglutide 0.75 MG/0.5ML Sopn Commonly known as:  TRULICITY Inject 7.10 mg into the skin every Friday. Start taking on:  06/02/2018   fish oil-omega-3 fatty acids 1000 MG capsule Take 1 g by mouth daily.   gabapentin 300 MG capsule Commonly known as:  NEURONTIN Take 1 capsule by mouth at  bedtime   Insulin Glargine 100 UNIT/ML Solostar Pen Commonly known as:  LANTUS SOLOSTAR Inject 5 Units into the skin at bedtime.   Insulin Pen Needle 31G X 6 MM Misc Use as needed to inject insulin in the skin.   LORazepam 0.5 MG tablet Commonly known as:  ATIVAN Take 1 tablet (0.5 mg total) by mouth 2 (two) times daily as needed for anxiety or sleep. What changed:    medication strength  how  much to take  when to take this  reasons to take this   magnesium oxide 400 (241.3 Mg) MG tablet Commonly known as:  MAG-OX Take 1 tablet (400 mg total) by mouth daily.   multivitamin with minerals Tabs tablet Take 1 tablet by mouth daily.   potassium chloride 10 MEQ tablet Commonly known as:  K-DUR Take 1 tablet (10 mEq total) by mouth daily.       Major procedures and Radiology Reports - PLEASE review detailed and final reports for all details, in brief -   Ct Head Wo Contrast  Result Date: 05/26/2018 CLINICAL DATA:  Altered mental status today.  Weakness. EXAM: CT HEAD WITHOUT CONTRAST TECHNIQUE: Contiguous axial images were obtained from the base of the skull through the vertex without intravenous contrast. COMPARISON:  Head CT scan 05/07/2018.  Brain MRI 01/05/2018. FINDINGS: Brain: No evidence of acute infarction, hemorrhage, hydrocephalus, extra-axial collection or mass lesion/mass effect. Atrophy and extensive chronic microvascular ischemic change are seen as on the prior studies. Vascular: Atherosclerosis noted. Skull: Intact.  No focal lesion. Sinuses/Orbits: Negative. Other: None. IMPRESSION: No acute abnormality. Atrophy and extensive chronic microvascular ischemic change. Atherosclerosis. Electronically Signed   By: Inge Rise M.D.   On: 05/26/2018 13:18   Ct Head Wo Contrast  Result Date: 05/07/2018 CLINICAL DATA:  Unwitnessed fall from wheelchair. Diffuse pain. History of stroke, hyperlipidemia, diabetes. EXAM: CT HEAD WITHOUT CONTRAST CT CERVICAL SPINE WITHOUT CONTRAST TECHNIQUE: Multidetector CT imaging of the head and cervical spine was performed following the standard protocol without intravenous contrast. Multiplanar CT image reconstructions of the cervical spine were also generated. COMPARISON:  CT HEAD January 09, 2018 and CT cervical spine September 08, 2016. FINDINGS: CT HEAD FINDINGS BRAIN: No intraparenchymal hemorrhage, mass effect nor midline shift.  Moderate to severe parenchymal brain volume loss. No hydrocephalus. Resolution of intraventricular blood  products. Old LEFT basal ganglia and LEFT thalamus lacunar infarcts. Confluent supratentorial white matter hypodensities. No acute large vascular territory infarcts. No abnormal extra-axial fluid collections. Basal cisterns are patent. VASCULAR: Moderate calcific atherosclerosis of the carotid siphons. SKULL: No skull fracture. No significant scalp soft tissue swelling. SINUSES/ORBITS: The mastoid air-cells and included paranasal sinuses are well-aerated.The included ocular globes and orbital contents are non-suspicious. OTHER: Patient is edentulous. CT CERVICAL SPINE FINDINGS ALIGNMENT: Broad reversed lordosis.  Vertebral bodies in alignment. SKULL BASE AND VERTEBRAE: Cervical vertebral bodies and posterior elements are intact. Moderate to severe C5-6 disc height loss and endplate spurring compatible with degenerative disc, stable from prior imaging. No destructive bony lesions. C1-2 articulation maintained mild arthropathy. Moderate RIGHT upper cervical facet arthropathy. SOFT TISSUES AND SPINAL CANAL: Nonacute. Mild calcific atherosclerosis carotid bifurcations. DISC LEVELS: No significant osseous canal stenosis or neural foraminal infarcts. UPPER CHEST: Lung apices are clear. OTHER: None. IMPRESSION: CT HEAD: 1. No acute intracranial process. 2. Stable moderate to severe parenchymal brain volume loss. 3. Severe chronic small vessel ischemic changes. Old LEFT basal ganglia and thalamus lacunar infarcts. CT CERVICAL SPINE: 1. No fracture or malalignment. Electronically Signed   By: Elon Alas M.D.   On: 05/07/2018 23:01   Ct Cervical Spine Wo Contrast  Result Date: 05/07/2018 CLINICAL DATA:  Unwitnessed fall from wheelchair. Diffuse pain. History of stroke, hyperlipidemia, diabetes. EXAM: CT HEAD WITHOUT CONTRAST CT CERVICAL SPINE WITHOUT CONTRAST TECHNIQUE: Multidetector CT imaging of the head and  cervical spine was performed following the standard protocol without intravenous contrast. Multiplanar CT image reconstructions of the cervical spine were also generated. COMPARISON:  CT HEAD January 09, 2018 and CT cervical spine September 08, 2016. FINDINGS: CT HEAD FINDINGS BRAIN: No intraparenchymal hemorrhage, mass effect nor midline shift. Moderate to severe parenchymal brain volume loss. No hydrocephalus. Resolution of intraventricular blood products. Old LEFT basal ganglia and LEFT thalamus lacunar infarcts. Confluent supratentorial white matter hypodensities. No acute large vascular territory infarcts. No abnormal extra-axial fluid collections. Basal cisterns are patent. VASCULAR: Moderate calcific atherosclerosis of the carotid siphons. SKULL: No skull fracture. No significant scalp soft tissue swelling. SINUSES/ORBITS: The mastoid air-cells and included paranasal sinuses are well-aerated.The included ocular globes and orbital contents are non-suspicious. OTHER: Patient is edentulous. CT CERVICAL SPINE FINDINGS ALIGNMENT: Broad reversed lordosis.  Vertebral bodies in alignment. SKULL BASE AND VERTEBRAE: Cervical vertebral bodies and posterior elements are intact. Moderate to severe C5-6 disc height loss and endplate spurring compatible with degenerative disc, stable from prior imaging. No destructive bony lesions. C1-2 articulation maintained mild arthropathy. Moderate RIGHT upper cervical facet arthropathy. SOFT TISSUES AND SPINAL CANAL: Nonacute. Mild calcific atherosclerosis carotid bifurcations. DISC LEVELS: No significant osseous canal stenosis or neural foraminal infarcts. UPPER CHEST: Lung apices are clear. OTHER: None. IMPRESSION: CT HEAD: 1. No acute intracranial process. 2. Stable moderate to severe parenchymal brain volume loss. 3. Severe chronic small vessel ischemic changes. Old LEFT basal ganglia and thalamus lacunar infarcts. CT CERVICAL SPINE: 1. No fracture or malalignment. Electronically  Signed   By: Elon Alas M.D.   On: 05/07/2018 23:01   Mr Brain Wo Contrast  Result Date: 05/26/2018 CLINICAL DATA:  Initial evaluation for acute weakness, history of prior CVA with left-sided deficits. EXAM: MRI HEAD WITHOUT CONTRAST TECHNIQUE: Multiplanar, multiecho pulse sequences of the brain and surrounding structures were obtained without intravenous contrast. COMPARISON:  Prior CT from earlier the same day as well as previous MRI from 01/05/2018. FINDINGS: Brain: Advanced cerebral atrophy with chronic  small vessel ischemic disease, stable from previous. Remote lacunar infarcts present within the left basal ganglia, left thalamus, and left pons. Few probable tiny remote infarcts noted within the bilateral cerebellar hemispheres. No abnormal foci of restricted diffusion to suggest acute ischemic infarct. Gray-white matter differentiation maintained. No evidence for acute intracranial hemorrhage. Chronic hemosiderin staining seen along the ventricular appended my of the lateral ventricles, consistent with prior intraventricular hemorrhage. Scattered susceptibility artifacts seen involving the bilateral temporal occipital regions also consistent with prior hemorrhage. No mass lesion or midline shift. Diffuse ventricular prominence, like related global parenchymal volume loss of hydrocephalus, stable. No extra-axial fluid collection. Major dural sinuses patent. Normal pituitary gland.  Corpus callosum is thinned and atrophic. Vascular: Major intracranial vascular flow voids maintained. Skull and upper cervical spine: Craniocervical junction normal. Upper cervical spine within normal limits. Bone marrow signal intensity within normal limits. No scalp soft tissue abnormality. Sinuses/Orbits: Globes and orbital soft tissues demonstrate no acute finding. Paranasal sinuses clear. Small left mastoid effusion noted, of doubtful significance. Inner ear structures normal. Other: None. IMPRESSION: 1. No acute  intracranial abnormality. 2. Advanced cerebral atrophy with chronic small vessel ischemic disease with remote lacunar infarcts involving the left basal ganglia, left thalamus, and left paramedian pons. Electronically Signed   By: Jeannine Boga M.D.   On: 05/26/2018 21:50   Dg Chest Port 1 View  Result Date: 05/26/2018 CLINICAL DATA:  Altered mental status EXAM: PORTABLE CHEST 1 VIEW COMPARISON:  January 09, 2018 FINDINGS: There is slight elevation of the left hemidiaphragm, stable. There is no edema or consolidation. Heart size and pulmonary vascularity are normal. No adenopathy. No bone lesions. IMPRESSION: No edema or consolidation.  Stable cardiac silhouette. Electronically Signed   By: Lowella Grip III M.D.   On: 05/26/2018 13:32    Micro Results   No results found for this or any previous visit (from the past 240 hour(s)).     Today   Subjective    Sheila [name] today has no new complaints  , was n.p.o. overnight so blood sugar was low, patient was then fed snacks and food        Patient has been seen and examined prior to discharge   Objective   Blood pressure 115/87, pulse 91, temperature 98.2 F (36.8 C), temperature source Oral, resp. rate (!) 24, height 5\' 3"  (1.6 m), weight 73.1 kg (161 lb 2.5 oz), SpO2 95 %.   Intake/Output Summary (Last 24 hours) at 05/27/2018 1425 Last data filed at 05/27/2018 0900 Gross per 24 hour  Intake 2067.5 ml  Output 600 ml  Net 1467.5 ml    Exam Physical Examination: General appearance -awake, follows commands, answers questions  mental status -alert and oriented x2 Eyes - sclera anicteric Neck - supple, no JVD elevation , Chest - clear  to auscultation bilaterally, symmetrical air movement,  Heart - S1 and S2 normal,  Abdomen - soft, nontender, nondistended, no masses or organomegaly Neurological -patient is alert more coherent, neuro status appears to be back to baseline overall, without any NEW focal deficits extremities -  no pedal edema noted, intact peripheral pulses  Skin - warm, dry    Data Review   CBC w Diff:  Lab Results  Component Value Date   WBC 5.2 05/27/2018   HGB 11.8 (L) 05/27/2018   HCT 33.6 (L) 05/27/2018   PLT 350 05/27/2018   LYMPHOPCT 20 05/26/2018   MONOPCT 8 05/26/2018   EOSPCT 1 05/26/2018   BASOPCT 1 05/26/2018  CMP:  Lab Results  Component Value Date   NA 134 (L) 05/27/2018   NA 135 (A) 02/25/2016   K 4.5 05/27/2018   CL 104 05/27/2018   CO2 20 (L) 05/27/2018   BUN 15 05/27/2018   BUN 18 02/25/2016   CREATININE 0.91 05/27/2018   GLU 267 02/25/2016   PROT 6.4 (L) 05/26/2018   ALBUMIN 3.2 (L) 05/26/2018   BILITOT 1.1 05/26/2018   ALKPHOS 97 05/26/2018   AST 39 05/26/2018   ALT 21 05/26/2018  .   Total Discharge time is about 33 minutes  Roxan Hockey M.D on 05/27/2018 at 2:25 PM  [facility]   Office  [phone]  Voice Recognition Viviann Spare dictation system was used to create this note, attempts have been made to correct errors. Please contact the author with questions and/or clarifications.

## 2018-05-27 NOTE — Progress Notes (Signed)
Discharge to: [facility] Anticipated discharge date: 05/27/18 Family notified: Yes, by phone: Lauro Regulus Transportation by: [facility]  Report #: [id number]  [place] signing off.  Laveda Abbe LCSW [phone]

## 2018-05-27 NOTE — Clinical Social Work Note (Signed)
Clinical Social Work Assessment  Patient Details  Name: Sheila [name] MRN: [id number] Date of Birth: 1951/03/29  Date of referral:  05/27/18               Reason for consult:  Facility Placement                Permission sought to share information with:  Facility Sport and exercise psychologist, Family Supports Permission granted to share information::  Yes, Verbal Permission Granted  Name::     Sheila [name]::  Sheila [name]  Relationship::  Son  Sport and exercise psychologist Information:     Housing/Transportation Living arrangements for the past 2 months:  [place] of Information:  Patient, Adult Children Patient Interpreter Needed:  None Criminal Activity/Legal Involvement Pertinent to Current Situation/Hospitalization:  No - Comment as needed Significant Relationships:  Adult Children Lives with:  Self, Facility Resident Do you feel safe going back to the place where you live?  Yes Need for family participation in patient care:  No (Coment)  Care giving concerns:  Patient from [facility] and has no concerns about care received.   Social Worker assessment / plan:  CSW met with patient to discuss return to facility. CSW contacted facility and sent information. CSW contacted patient's son and updated on patient's return home.  Employment status:  Retired Forensic scientist:  Medicare PT Recommendations:  Not assessed at this time Information / Referral to community resources:     Patient/Family's Response to care:  Patient was excited to get back to [name].  Patient/Family's Understanding of and Emotional Response to Diagnosis, Current Treatment, and Prognosis:  Patient acknowledged that she has been at [facility] for a long time and that's her home. Patient was very happy to hear that she'd be going back. Patient requested that CSW contact her son to inform him that she would be going back today.  Emotional Assessment Appearance:  Appears stated  [name] Attitude/Demeanor/Rapport:  Engaged Affect (typically observed):  Pleasant Orientation:  Oriented to Self, Oriented to Place Alcohol / Substance use:  Not Applicable Psych involvement (Current and /or in the community):  No (Comment)  Discharge Needs  Concerns to be addressed:  Care Coordination Readmission within the last 30 days:  Yes Current discharge risk:    Barriers to Discharge:  No Barriers Identified   Sheila Ochs, LCSW 05/27/2018, 3:33 PM

## 2018-05-27 NOTE — Discharge Instructions (Signed)
1) lorazepam has been reduced to 0.5 mg twice a day as needed due to concerns about excessive lethargy  2) repeat BMP test on Tuesday, 05/30/2018--- if renal function and bicarb are normal then consider resuming metformin 3) work-up shows no evidence of new strokes or seizures

## 2018-05-27 NOTE — Plan of Care (Signed)

## 2018-05-29 ENCOUNTER — Other Ambulatory Visit: Payer: Self-pay

## 2018-05-29 ENCOUNTER — Emergency Department (HOSPITAL_COMMUNITY): Payer: Medicare Other

## 2018-05-29 ENCOUNTER — Inpatient Hospital Stay (HOSPITAL_COMMUNITY)
Admission: EM | Admit: 2018-05-29 | Discharge: 2018-06-01 | DRG: 092 | Disposition: A | Discharge status: Skilled Nursing Facility | Payer: Medicare Other | Attending: Internal Medicine | Admitting: Internal Medicine

## 2018-05-29 ENCOUNTER — Inpatient Hospital Stay (HOSPITAL_COMMUNITY): Payer: Medicare Other

## 2018-05-29 ENCOUNTER — Emergency Department (HOSPITAL_COMMUNITY)
Admission: EM | Admit: 2018-05-29 | Discharge: 2018-05-29 | Disposition: A | Discharge status: Home / Self Care | Payer: Medicare Other | Source: Home / Self Care | Attending: Emergency Medicine | Admitting: Emergency Medicine

## 2018-05-29 ENCOUNTER — Encounter (HOSPITAL_COMMUNITY): Payer: Self-pay | Admitting: Emergency Medicine

## 2018-05-29 DIAGNOSIS — R278 Other lack of coordination: Secondary | ICD-10-CM | POA: Diagnosis not present

## 2018-05-29 DIAGNOSIS — Z7401 Bed confinement status: Secondary | ICD-10-CM

## 2018-05-29 DIAGNOSIS — F0150 Vascular dementia without behavioral disturbance: Secondary | ICD-10-CM | POA: Diagnosis present

## 2018-05-29 DIAGNOSIS — R633 Feeding difficulties: Secondary | ICD-10-CM | POA: Diagnosis not present

## 2018-05-29 DIAGNOSIS — F419 Anxiety disorder, unspecified: Secondary | ICD-10-CM | POA: Diagnosis present

## 2018-05-29 DIAGNOSIS — I10 Essential (primary) hypertension: Secondary | ICD-10-CM | POA: Diagnosis not present

## 2018-05-29 DIAGNOSIS — M79604 Pain in right leg: Secondary | ICD-10-CM

## 2018-05-29 DIAGNOSIS — Z79899 Other long term (current) drug therapy: Secondary | ICD-10-CM

## 2018-05-29 DIAGNOSIS — R079 Chest pain, unspecified: Secondary | ICD-10-CM

## 2018-05-29 DIAGNOSIS — M109 Gout, unspecified: Secondary | ICD-10-CM | POA: Diagnosis present

## 2018-05-29 DIAGNOSIS — Z882 Allergy status to sulfonamides status: Secondary | ICD-10-CM | POA: Diagnosis not present

## 2018-05-29 DIAGNOSIS — R4182 Altered mental status, unspecified: Secondary | ICD-10-CM | POA: Diagnosis present

## 2018-05-29 DIAGNOSIS — R296 Repeated falls: Secondary | ICD-10-CM | POA: Diagnosis present

## 2018-05-29 DIAGNOSIS — M21371 Foot drop, right foot: Secondary | ICD-10-CM | POA: Diagnosis present

## 2018-05-29 DIAGNOSIS — Z794 Long term (current) use of insulin: Secondary | ICD-10-CM | POA: Diagnosis not present

## 2018-05-29 DIAGNOSIS — Z87891 Personal history of nicotine dependence: Secondary | ICD-10-CM

## 2018-05-29 DIAGNOSIS — Z993 Dependence on wheelchair: Secondary | ICD-10-CM

## 2018-05-29 DIAGNOSIS — E785 Hyperlipidemia, unspecified: Secondary | ICD-10-CM | POA: Diagnosis present

## 2018-05-29 DIAGNOSIS — M6281 Muscle weakness (generalized): Secondary | ICD-10-CM | POA: Diagnosis not present

## 2018-05-29 DIAGNOSIS — S83011A Lateral subluxation of right patella, initial encounter: Secondary | ICD-10-CM | POA: Diagnosis not present

## 2018-05-29 DIAGNOSIS — M2550 Pain in unspecified joint: Secondary | ICD-10-CM | POA: Diagnosis not present

## 2018-05-29 DIAGNOSIS — M1990 Unspecified osteoarthritis, unspecified site: Secondary | ICD-10-CM | POA: Diagnosis present

## 2018-05-29 DIAGNOSIS — G92 Toxic encephalopathy: Principal | ICD-10-CM | POA: Diagnosis present

## 2018-05-29 DIAGNOSIS — G9340 Encephalopathy, unspecified: Secondary | ICD-10-CM | POA: Diagnosis not present

## 2018-05-29 DIAGNOSIS — R404 Transient alteration of awareness: Secondary | ICD-10-CM | POA: Diagnosis not present

## 2018-05-29 DIAGNOSIS — R41841 Cognitive communication deficit: Secondary | ICD-10-CM | POA: Diagnosis not present

## 2018-05-29 DIAGNOSIS — I69391 Dysphagia following cerebral infarction: Secondary | ICD-10-CM | POA: Diagnosis not present

## 2018-05-29 DIAGNOSIS — R0789 Other chest pain: Secondary | ICD-10-CM | POA: Diagnosis not present

## 2018-05-29 DIAGNOSIS — I69151 Hemiplegia and hemiparesis following nontraumatic intracerebral hemorrhage affecting right dominant side: Secondary | ICD-10-CM | POA: Diagnosis not present

## 2018-05-29 DIAGNOSIS — R1310 Dysphagia, unspecified: Secondary | ICD-10-CM | POA: Diagnosis not present

## 2018-05-29 DIAGNOSIS — F329 Major depressive disorder, single episode, unspecified: Secondary | ICD-10-CM | POA: Diagnosis present

## 2018-05-29 DIAGNOSIS — F05 Delirium due to known physiological condition: Secondary | ICD-10-CM | POA: Diagnosis present

## 2018-05-29 DIAGNOSIS — I69311 Memory deficit following cerebral infarction: Secondary | ICD-10-CM | POA: Diagnosis not present

## 2018-05-29 DIAGNOSIS — Z888 Allergy status to other drugs, medicaments and biological substances status: Secondary | ICD-10-CM | POA: Diagnosis not present

## 2018-05-29 DIAGNOSIS — I69191 Dysphagia following nontraumatic intracerebral hemorrhage: Secondary | ICD-10-CM

## 2018-05-29 DIAGNOSIS — E119 Type 2 diabetes mellitus without complications: Secondary | ICD-10-CM | POA: Diagnosis not present

## 2018-05-29 DIAGNOSIS — G9341 Metabolic encephalopathy: Secondary | ICD-10-CM | POA: Diagnosis not present

## 2018-05-29 DIAGNOSIS — T424X5A Adverse effect of benzodiazepines, initial encounter: Secondary | ICD-10-CM | POA: Diagnosis present

## 2018-05-29 DIAGNOSIS — E1142 Type 2 diabetes mellitus with diabetic polyneuropathy: Secondary | ICD-10-CM | POA: Diagnosis present

## 2018-05-29 DIAGNOSIS — R4701 Aphasia: Secondary | ICD-10-CM | POA: Diagnosis not present

## 2018-05-29 DIAGNOSIS — R0902 Hypoxemia: Secondary | ICD-10-CM | POA: Diagnosis not present

## 2018-05-29 DIAGNOSIS — R0689 Other abnormalities of breathing: Secondary | ICD-10-CM | POA: Diagnosis not present

## 2018-05-29 DIAGNOSIS — J9811 Atelectasis: Secondary | ICD-10-CM | POA: Diagnosis not present

## 2018-05-29 DIAGNOSIS — Z8673 Personal history of transient ischemic attack (TIA), and cerebral infarction without residual deficits: Secondary | ICD-10-CM

## 2018-05-29 DIAGNOSIS — I69320 Aphasia following cerebral infarction: Secondary | ICD-10-CM

## 2018-05-29 DIAGNOSIS — M25461 Effusion, right knee: Secondary | ICD-10-CM | POA: Diagnosis present

## 2018-05-29 DIAGNOSIS — R402441 Other coma, without documented Glasgow coma scale score, or with partial score reported, in the field [EMT or ambulance]: Secondary | ICD-10-CM | POA: Diagnosis not present

## 2018-05-29 DIAGNOSIS — Z9181 History of falling: Secondary | ICD-10-CM | POA: Diagnosis not present

## 2018-05-29 LAB — URINALYSIS, COMPLETE (UACMP) WITH MICROSCOPIC
BILIRUBIN URINE: NEGATIVE
Glucose, UA: NEGATIVE mg/dL
Hgb urine dipstick: NEGATIVE
KETONES UR: 5 mg/dL — AB
LEUKOCYTES UA: NEGATIVE
Nitrite: NEGATIVE
Protein, ur: NEGATIVE mg/dL
SPECIFIC GRAVITY, URINE: 1.011 (ref 1.005–1.030)
pH: 5 (ref 5.0–8.0)

## 2018-05-29 LAB — URIC ACID: Uric Acid, Serum: 6.7 mg/dL — ABNORMAL HIGH (ref 2.3–6.6)

## 2018-05-29 LAB — CBC WITH DIFFERENTIAL/PLATELET
Abs Immature Granulocytes: 0 10*3/uL (ref 0.0–0.1)
BASOS ABS: 0 10*3/uL (ref 0.0–0.1)
BASOS PCT: 1 %
EOS ABS: 0.1 10*3/uL (ref 0.0–0.7)
EOS PCT: 2 %
HCT: 35 % — ABNORMAL LOW (ref 36.0–46.0)
HEMOGLOBIN: 12.3 g/dL (ref 12.0–15.0)
Immature Granulocytes: 0 %
Lymphocytes Relative: 25 %
Lymphs Abs: 1.4 10*3/uL (ref 0.7–4.0)
MCH: 30.1 pg (ref 26.0–34.0)
MCHC: 35.1 g/dL (ref 30.0–36.0)
MCV: 85.8 fL (ref 78.0–100.0)
Monocytes Absolute: 0.8 10*3/uL (ref 0.1–1.0)
Monocytes Relative: 14 %
Neutro Abs: 3.3 10*3/uL (ref 1.7–7.7)
Neutrophils Relative %: 58 %
Platelets: 359 10*3/uL (ref 150–400)
RBC: 4.08 MIL/uL (ref 3.87–5.11)
RDW: 12.4 % (ref 11.5–15.5)
WBC: 5.7 10*3/uL (ref 4.0–10.5)

## 2018-05-29 LAB — COMPREHENSIVE METABOLIC PANEL
ALT: 19 U/L (ref 14–54)
AST: 29 U/L (ref 15–41)
Albumin: 3.1 g/dL — ABNORMAL LOW (ref 3.5–5.0)
Alkaline Phosphatase: 98 U/L (ref 38–126)
Anion gap: 9 (ref 5–15)
BILIRUBIN TOTAL: 0.6 mg/dL (ref 0.3–1.2)
BUN: 14 mg/dL (ref 6–20)
CALCIUM: 9.3 mg/dL (ref 8.9–10.3)
CO2: 21 mmol/L — ABNORMAL LOW (ref 22–32)
CREATININE: 0.9 mg/dL (ref 0.44–1.00)
Chloride: 104 mmol/L (ref 101–111)
GFR calc Af Amer: >60 mL/min (ref >60)
Glucose, Bld: 151 mg/dL — ABNORMAL HIGH (ref 65–99)
Potassium: 4.8 mmol/L (ref 3.5–5.1)
Sodium: 134 mmol/L — ABNORMAL LOW (ref 135–145)
TOTAL PROTEIN: 6.1 g/dL — AB (ref 6.5–8.1)

## 2018-05-29 LAB — MAGNESIUM: Magnesium: 1.6 mg/dL — ABNORMAL LOW (ref 1.7–2.4)

## 2018-05-29 LAB — I-STAT CG4 LACTIC ACID, ED: LACTIC ACID, VENOUS: 1.01 mmol/L (ref 0.5–1.9)

## 2018-05-29 LAB — PHOSPHORUS: PHOSPHORUS: 3.4 mg/dL (ref 2.5–4.6)

## 2018-05-29 LAB — PROTIME-INR
INR: 1.08
PROTHROMBIN TIME: 14 s (ref 11.4–15.2)

## 2018-05-29 LAB — CBG MONITORING, ED
GLUCOSE-CAPILLARY: 69 mg/dL (ref 65–99)
GLUCOSE-CAPILLARY: 70 mg/dL (ref 65–99)
Glucose-Capillary: 148 mg/dL — ABNORMAL HIGH (ref 65–99)

## 2018-05-29 LAB — VALPROIC ACID LEVEL: VALPROIC ACID LVL: 11 ug/mL — AB (ref 50.0–100.0)

## 2018-05-29 LAB — GLUCOSE, CAPILLARY
GLUCOSE-CAPILLARY: 54 mg/dL — AB (ref 65–99)
Glucose-Capillary: 137 mg/dL — ABNORMAL HIGH (ref 65–99)

## 2018-05-29 LAB — MRSA PCR SCREENING: MRSA BY PCR: NEGATIVE

## 2018-05-29 MED ORDER — ONDANSETRON HCL 4 MG/2ML IJ SOLN: 4.0000 mg | Freq: Four times a day (QID) | INTRAMUSCULAR | Status: DC | PRN

## 2018-05-29 MED ORDER — KETOROLAC TROMETHAMINE 15 MG/ML IJ SOLN: 15.0000 mg | Freq: Four times a day (QID) | INTRAMUSCULAR | Status: DC | PRN

## 2018-05-29 MED ORDER — SODIUM CHLORIDE 0.9 % IV BOLUS
1000.0000 mL | Freq: Once | INTRAVENOUS | Status: AC
  Administered 2018-05-29: 1000 mL via INTRAVENOUS

## 2018-05-29 MED ORDER — INSULIN ASPART 100 UNIT/ML ~~LOC~~ SOLN
0.0000 [IU] | SUBCUTANEOUS | Status: DC
  Administered 2018-05-30: 3 [IU] via SUBCUTANEOUS
  Administered 2018-05-30 – 2018-05-31 (×2): 2 [IU] via SUBCUTANEOUS
  Administered 2018-05-31 (×2): 3 [IU] via SUBCUTANEOUS
  Administered 2018-05-31 – 2018-06-01 (×3): 2 [IU] via SUBCUTANEOUS
  Administered 2018-06-01: 3 [IU] via SUBCUTANEOUS

## 2018-05-29 MED ORDER — DEXTROSE 50 % IV SOLN
INTRAVENOUS | Status: AC
  Administered 2018-05-29: 50 mL
  Filled 2018-05-29: qty 50

## 2018-05-29 MED ORDER — ONDANSETRON HCL 4 MG PO TABS: 4.0000 mg | ORAL_TABLET | Freq: Four times a day (QID) | ORAL | Status: DC | PRN

## 2018-05-29 MED ORDER — SODIUM CHLORIDE 0.9 % IV SOLN
INTRAVENOUS | Status: DC
  Administered 2018-05-29 – 2018-05-30 (×2): via INTRAVENOUS

## 2018-05-29 MED ORDER — ENOXAPARIN SODIUM 40 MG/0.4ML ~~LOC~~ SOLN
40.0000 mg | SUBCUTANEOUS | Status: DC
  Administered 2018-05-30 – 2018-05-31 (×2): 40 mg via SUBCUTANEOUS
  Filled 2018-05-29 (×2): qty 0.4

## 2018-05-29 MED ORDER — SODIUM CHLORIDE 0.9% FLUSH
3.0000 mL | Freq: Two times a day (BID) | INTRAVENOUS | Status: DC
  Administered 2018-05-29 – 2018-05-30 (×2): 3 mL via INTRAVENOUS

## 2018-05-29 MED ORDER — KETOROLAC TROMETHAMINE 15 MG/ML IJ SOLN
15.0000 mg | Freq: Once | INTRAMUSCULAR | Status: AC
  Administered 2018-05-29: 15 mg via INTRAVENOUS
  Filled 2018-05-29: qty 1

## 2018-05-29 MED ORDER — SODIUM CHLORIDE 0.9 % IV SOLN: INTRAVENOUS | Status: DC

## 2018-05-29 NOTE — ED Notes (Signed)
Transported to CT scan via stretcher per rad tech  

## 2018-05-29 NOTE — Progress Notes (Signed)
Hypoglycemic Event  CBG: 54  Treatment: D50 IV 50 mL  Symptoms: None  Follow-up CBG: Time: 2117 CBG Result:137  Possible Reasons for Event: Inadequate meal intake; Patient is NPO and cannot have anything orally until after having a speech evaluation  Comments/MD notified: [place],NP notified. No new orders placed at this time. Will continue to monitor and treat per MD orders.    [place]

## 2018-05-29 NOTE — Discharge Instructions (Addendum)
As discussed, your evaluation today has been largely reassuring.  But, it is important that you monitor your condition carefully, and do not hesitate to return to the ED if you develop new, or concerning changes in your condition. ? ?Otherwise, please follow-up with your physician for appropriate ongoing care. ? ?

## 2018-05-29 NOTE — ED Notes (Signed)
[facility] called for transportation back to [place] at [facility]. No special equipment needed for transport.

## 2018-05-29 NOTE — ED Notes (Signed)
In and out cath done for u/a and urine c&s; perineal area cleansed with soap and water prior to procedure; approx 300 cc cloudy, malodorous urine obtained - pt tolerated procedure well; pure wick placed post cath - hooked to low wall suction

## 2018-05-29 NOTE — ED Triage Notes (Signed)
Pt arrived from [place] at [facility] with Ems. Pt. Was d/c from hospital 2 hours ago. When arrived back at the facility the pts nurse still thought the pt seemed more altered than normal. Ems reports pt shows signs of pain when touched on her legs/hips.

## 2018-05-29 NOTE — Progress Notes (Signed)
Received report from [place] in the ED.

## 2018-05-29 NOTE — H&P (Signed)
History and Physical    Sheila [name] HCW:[id number] DOB: Aug 03, 1951 DOA: 05/29/2018  **Will admit patient based on the expectation that the patient will need hospitalization/ hospital care that crosses at least 2 midnights  PCP: Golden Circle, FNP   Attending physician: Renne Crigler  Patient coming from/Resides with: SNF/[facility]  Chief Complaint: Altered mental status, pain right lower extremity  HPI: Sheila [name] is a 67 y.o. female with medical history significant for ICH and Jan 2019 with resultant partial global aphasia as well as dysphagia requiring pured diet at time of discharge.  Patient also has diabetes on injectable insulin, diabetic neuropathy, PAD, prior stroke in 2017 and dyslipidemia.  Patient has been evaluated the ER 4 times since discharge in January with each visit secondary to falling.  She was most recently discharged from this facility on 6/1 after acute kidney injury, hypotension and altered mental status felt secondary to benzodiazepines.  Her Ativan dosage was decreased from 1 to 0.5 mg.  She was sent back to the ER early this morning on 6/3 due to complaints of altered mental status and chest pain.  Evaluation unremarkable indications for admission and patient was subsequently sent back to skilled nursing facility.  This afternoon the patient was sent back to the ER by nursing facility staff once again for altered mental status this time it was documented that EMS noted patient showed signs of pain when touched on her legs and hips.  Chest x-ray and CT of the head unremarkable for acute process.  Labs were unremarkable.  My evaluation of the patient any movement of the right leg exhibited signs of grimacing pain and it was very clear that the patient had been crying recently.  No obvious trauma noted on hip abdomen or right lower extremity although right knee was swollen and reddened.  Attempts to roll patient over on her left side patient clearly again in pain.   Patient at this point has intermittent aphasia and was unable to verbally respond or follow commands during my evaluation.  ED Course:  Vital Signs: BP 131/73   Pulse 89   Temp 98.5 F (36.9 C) (Oral)   Resp 20   Ht 5\' 6"  (1.676 m)   Wt 73 kg (161 lb)   SpO2 98%   BMI 25.99 kg/m  Chest X-ray/CT head: As above Lab data: (From earlier today) sodium 134, potassium 4.8, chloride 104, CO2 21, glucose 151, BUN 14, creatinine 0.9, anion gap 9, LFTs not elevated, lactic acid normal, white count 5700 with normal differential, hemoglobin 12.3, platelets 359,000 coags normal, urinalysis was unremarkable with 5 ketones, rare bacteria.  Urine culture obtained and results are pending. Medications and treatments: None  Review of Systems:  **Unable to obtain from patient given underlying chronic aphasia   Past Medical History:  Diagnosis Date  . Anxiety   . Aphasia   . Arthritis   . Diabetes mellitus    complicated by peripheral neuropathy  . Dysphagia following nontraumatic intracerebral hemorrhage   . Hyperlipidemia   . Muscle weakness (generalized)   . Stroke [facility]) 2015   residual right foot drop    Past Surgical History:  Procedure Laterality Date  . CHOLECYSTECTOMY    . TONSILLECTOMY      Social History   Socioeconomic History  . Marital status: Widowed    Spouse name: Not on file  . Number of children: 3  . Years of education: 41  . Highest education level: Not on  file  Occupational History  . Occupation: retired---  H[employer]  Social Needs  . Financial resource strain: Not on file  . Food insecurity:    Worry: Not on file    Inability: Not on file  . Transportation needs:    Medical: Not on file    Non-medical: Not on file  Tobacco Use  . Smoking status: Former Smoker    Types: Cigarettes    Last attempt to quit: 02/15/1992    Years since quitting: 26.3  . Smokeless tobacco: Never Used  Substance and Sexual Activity  . Alcohol use: No    Alcohol/week: 0.0 oz     Comment: Socially  . Drug use: No    Types: Marijuana    Comment: SMOKES IT ANY TIME SHE CAN GET IT  . Sexual activity: Not Currently    Partners: Male  Lifestyle  . Physical activity:    Days per week: Not on file    Minutes per session: Not on file  . Stress: Not on file  Relationships  . Social connections:    Talks on phone: Not on file    Gets together: Not on file    Attends religious service: Not on file    Active member of club or organization: Not on file    Attends meetings of clubs or organizations: Not on file    Relationship status: Not on file  . Intimate partner violence:    Fear of current or ex partner: Not on file    Emotionally abused: Not on file    Physically abused: Not on file    Forced sexual activity: Not on file  Other Topics Concern  . Not on file  Social History Narrative   Fun: Talk, church activities    Denies any religious beliefs effecting health care.    Lives alone in a one story home.  Has 5 children.  Retired from [employer].    Mobility: Wheelchair bound although it was documented during April ER visit patient fell while pushing/walking behind wheelchair Work history: Disabled/retired   Allergies  Allergen Reactions  . Sulfa Antibiotics Other (See Comments)    As noted on MAR  . Paxil [Paroxetine Hcl] Other (See Comments)    Dreams/ nightmares  . Iopamidol Rash    Family History  Problem Relation Age of Onset  . Diabetes Mother        Deceased  . Diabetes Brother   . Healthy Son      Prior to Admission medications   Medication Sig Start Date End Date Taking? Authorizing Provider  acetaminophen (TYLENOL) 325 MG tablet Take 650 mg by mouth every 4 (four) hours as needed for mild pain, moderate pain or headache.   Yes [provider]  Amantadine HCl 100 MG tablet Take 50 mg by mouth 2 (two) times daily.   Yes [provider]  atorvastatin (LIPITOR) 20 MG tablet Take 1 tablet (20 mg total) by  mouth daily at 6 PM. Patient taking differently: Take 20 mg by mouth daily.  01/10/18  Yes Costello, Kayren Eaves, NP  divalproex (DEPAKOTE) 125 MG DR tablet Take 125 mg by mouth 3 (three) times daily with meals.    Yes [provider]  Dulaglutide (TRULICITY) 1.61 WR/6.0AV SOPN Inject 0.75 mg into the skin every Friday. 06/02/18  Yes Emokpae, Courage, MD  fish oil-omega-3 fatty acids 1000 MG capsule Take 1 g by mouth daily.   Yes [provider]  gabapentin (NEURONTIN) 300  MG capsule Take 1 capsule by mouth at  bedtime 12/16/15  Yes Golden Circle, FNP  Insulin Glargine (LANTUS SOLOSTAR) 100 UNIT/ML Solostar Pen Inject 5 Units into the skin at bedtime. 05/27/18  Yes Emokpae, Courage, MD  Insulin Pen Needle 31G X 6 MM MISC Use as needed to inject insulin in the skin. 06/24/17  Yes Golden Circle, FNP  LORazepam (ATIVAN) 0.5 MG tablet Take 1 tablet (0.5 mg total) by mouth 2 (two) times daily as needed for anxiety or sleep. 05/27/18  Yes Emokpae, Courage, MD  magnesium oxide (MAG-OX) 400 (241.3 Mg) MG tablet Take 1 tablet (400 mg total) by mouth daily. 01/10/18  Yes Costello, Kayren Eaves, NP  Multiple Vitamin (MULTIVITAMIN WITH MINERALS) TABS tablet Take 1 tablet by mouth daily.   Yes [provider]  potassium chloride (K-DUR) 10 MEQ tablet Take 1 tablet (10 mEq total) by mouth daily. 01/10/18  Yes Mary Sella, NP    Physical Exam: Vitals:   05/29/18 1639 05/29/18 1644 05/29/18 1646  BP:  131/73   Pulse:  89   Resp:  20   Temp:  98.5 F (36.9 C)   TempSrc:  Oral   SpO2: 98%    Weight:   73 kg (161 lb)  Height:   5\' 6"  (1.676 m)      Constitutional: NAD, appears mildly anxious, uncomfortable 2/2 ongoing right leg/knee pain Eyes: PERRL, lids and conjunctivae normal ENMT: Mucous membranes are dry. Posterior pharynx clear of any exudate or lesions. Neck: normal, supple, no masses, no thyromegaly Respiratory: clear to auscultation bilaterally, no wheezing, no crackles.  Normal respiratory effort. No accessory muscle use.  Cardiovascular: Regular rate and rhythm, no murmurs / rubs / gallops. No extremity edema. 2+ pedal pulses. No carotid bruits.  Abdomen: no tenderness, no masses palpated. No hepatosplenomegaly. Bowel sounds positive.  Musculoskeletal: no clubbing / cyanosis. No joint deformity upper and lower extremities.  Redness and swelling right knee.  Good ROM right side but noted with reproduction of pain with passive ROM right leg., evolving L LE contractures decreased range of motion especially left leg. Normal muscle tone.  Skin: no rashes, lesions, ulcers. No induration Neurologic: CN 2-12 grossly intact based on visual inspection/limited exam. Sensation intact, DTR clavicle. Strength unable to be adequately tested given patient's inability to follow simple commands.  Did note spontaneous movement of left arm that was purposeful-patient scratched face. Psychiatric: Alert, anxious and tearful mood.  Nonverbal.   Labs on Admission: I have personally reviewed following labs and imaging studies  CBC: Recent Labs  Lab 05/26/18 1219 05/27/18 0813 05/29/18 0733  WBC 6.1 5.2 5.7  NEUTROABS 4.2  --  3.3  HGB 14.5 11.8* 12.3  HCT 40.5 33.6* 35.0*  MCV 84.6 85.3 85.8  PLT 393 350 073   Basic Metabolic Panel: Recent Labs  Lab 05/26/18 1219 05/26/18 2230 05/27/18 0813 05/29/18 0733  NA 128* 132* 134* 134*  K 5.7* 5.0 4.5 4.8  CL 93* 100* 104 104  CO2 23 23 20* 21*  GLUCOSE 141* 104* 83 151*  BUN 23* 20 15 14   CREATININE 1.41* 1.22* 0.91 0.90  CALCIUM 9.4 8.9 9.5 9.3   GFR: Estimated Creatinine Clearance: 61.2 mL/min (by C-G formula based on SCr of 0.9 mg/dL). Liver Function Tests: Recent Labs  Lab 05/26/18 1219 05/26/18 2230 05/29/18 0733  AST 49* 39 29  ALT 23 21 19   ALKPHOS 100 97 98  BILITOT 1.1 1.1 0.6  PROT 6.9 6.4*  6.1*  ALBUMIN 3.4* 3.2* 3.1*   No results for input(s): LIPASE, AMYLASE in the last 168 hours. Recent Labs    Lab 05/26/18 1448  AMMONIA 36*   Coagulation Profile: Recent Labs  Lab 05/29/18 0733  INR 1.08   Cardiac Enzymes: No results for input(s): CKTOTAL, CKMB, CKMBINDEX, TROPONINI in the last 168 hours. BNP (last 3 results) No results for input(s): PROBNP in the last 8760 hours. HbA1C: No results for input(s): HGBA1C in the last 72 hours. CBG: Recent Labs  Lab 05/26/18 2345 05/27/18 0731 05/27/18 1125 05/29/18 0722 05/29/18 1722  GLUCAP 145* 87 57* 148* 69   Lipid Profile: No results for input(s): CHOL, HDL, LDLCALC, TRIG, CHOLHDL, LDLDIRECT in the last 72 hours. Thyroid Function Tests: No results for input(s): TSH, T4TOTAL, FREET4, T3FREE, THYROIDAB in the last 72 hours. Anemia Panel: No results for input(s): VITAMINB12, FOLATE, FERRITIN, TIBC, IRON, RETICCTPCT in the last 72 hours. Urine analysis:    Component Value Date/Time   COLORURINE YELLOW 05/29/2018 0706   APPEARANCEUR HAZY (A) 05/29/2018 0706   LABSPEC 1.011 05/29/2018 0706   PHURINE 5.0 05/29/2018 0706   GLUCOSEU NEGATIVE 05/29/2018 0706   HGBUR NEGATIVE 05/29/2018 0706   BILIRUBINUR NEGATIVE 05/29/2018 0706   BILIRUBINUR Neg 01/22/2014 1656   KETONESUR 5 (A) 05/29/2018 0706   PROTEINUR NEGATIVE 05/29/2018 0706   UROBILINOGEN 0.2 01/22/2014 1656   NITRITE NEGATIVE 05/29/2018 0706   LEUKOCYTESUR NEGATIVE 05/29/2018 0706   Sepsis Labs: @LABRCNTIP (procalcitonin:4,lacticidven:4) )No results found for this or any previous visit (from the past 240 hour(s)).   Radiological Exams on Admission: Ct Head Wo Contrast  Result Date: 05/29/2018 CLINICAL DATA:  Worsening of mental status over the last 3 days. History of previous stroke. EXAM: CT HEAD WITHOUT CONTRAST TECHNIQUE: Contiguous axial images were obtained from the base of the skull through the vertex without intravenous contrast. COMPARISON:  MRI 05/26/2018.  CT 05/26/2018. FINDINGS: Brain: Chronic generalized atrophy with advanced chronic small-vessel  ischemic changes throughout the cerebral hemispheric white matter. No sign of acute infarction, mass lesion, hemorrhage, hydrocephalus or extra-axial collection. Vascular: There is atherosclerotic calcification of the major vessels at the base of the brain. Skull: Normal Sinuses/Orbits: Clear/normal Other: None IMPRESSION: No acute finding by CT. Generalized atrophy with extensive chronic small-vessel ischemic changes throughout the brain. Electronically Signed   By: Nelson Chimes M.D.   On: 05/29/2018 08:22   Dg Chest Port 1 View  Result Date: 05/29/2018 CLINICAL DATA:  Altered mental status, history stroke, diabetes mellitus EXAM: PORTABLE CHEST 1 VIEW COMPARISON:  Portable exam 0712 hours compared to 05/26/2018 FINDINGS: Rotated to the LEFT. Normal heart size, mediastinal contours, and pulmonary vascularity. Decreased lung volumes with bibasilar atelectasis. Remaining lungs clear. No pleural effusion or pneumothorax. Bones demineralized. IMPRESSION: Bibasilar atelectasis. Electronically Signed   By: Lavonia Dana M.D.   On: 05/29/2018 07:23    EKG: (Independently reviewed) sinus rhythm with ventricular rate 88 bpm, QTC 423 ms, normal R wave rotation, no acute ischemic changes  Assessment/Plan Principal Problem:   Altered mental status -Patient sent back to ER today for reports of altered mentation/?  Worsening a aphasia-in review of documentation from previous stroke admission in January patient has a history of partial global aphasia which appears to have persisted post stroke -Currently patient is quite alert although remains nonverbal -Patient had a EEG completed last admission which was negative for seizure activity -Depakote level ordered by EDP and pending-we will hold this medication for now as well as other  sedating medications such as Neurontin and Ativan -Neurological checks every 4 hours  Active Problems:   Lower extremity pain, right -On exam noted to have swollen red knee ? gout-also  attending physician documented patient had pain response with palpation of her right great toe -Uric acid level -Previous plain films from April 2019 demonstrated irregular sclerosis of the left inferior pubic ramus so we will go ahead and CT right hip, femur and knee to better elucidate etiology to patient's pain especially in context of frequent falls since stroke -Toradol 15 mg IV x1 then every 6 hours prn-avoiding narcotics initially in context of reports of altered mentation    Aphasia following ICH Jan 2019 -Appears to be chronic and variable-documented as partial global aphasia by neurology    Dysphagia following nontraumatic intracerebral hemorrhage -In January SLP documented patient required dysphagia 1/pured diet, okay to use straws, required constant supervision with eating -Unclear to follow-up swallow evaluation completed so will obtain this admission -NPO IV fluids for now -Hold all preadmission medications -Statin on hold as above    Diabetes mellitus type 2, insulin dependent  -Hold low-dose Lantus and Trulicity while NPO in favor of SSI    Anxiety/depression -Prior to stroke was on Seroquel and after stroke is now on Depakote-uncertain if Depakote being utilized for psychogenic properties versus seizure prophylaxis and patient with history of stroke -Ativan on hold as above    **Additional lab, imaging and/or diagnostic evaluation at discretion of supervising physician  DVT prophylaxis: Lovenox Code Status: Full Family Communication: No family at bedside Disposition Plan: SNF Consults called: None    Aluna Whiston L. ANP-BC [facility] Pager [phone]   If 7PM-7AM, please contact night-coverage [email or web address] Password [facility]  05/29/2018, 5:46 PM

## 2018-05-29 NOTE — ED Notes (Signed)
Transported from CT scan via stretcher per rad tech  

## 2018-05-29 NOTE — ED Notes (Signed)
Care assumed at this time; pt resting quietly on stretcher - eyes open, pt awake, alert, non-verbal; h/o CVA with reported expressive aphasia; skin darkj, warm, dry, RUE swollen from rgt wrist to rgt mid humeral area - present upon arrival to ED; ccm showing SR rate 83 without ectopy - VSS

## 2018-05-29 NOTE — Progress Notes (Signed)
Patient transferred from ED to [phone]. Patient A&Ox2 (name and place). No orders for telemetry Skin assessment completed by this RN and Production assistant, radio. Patient instructed how to use the callbell before getting out of bed. Callbell within reach. Admission handbook given to patient. Will continue to monitor and treat per MD orders.

## 2018-05-29 NOTE — ED Provider Notes (Signed)
[place] EMERGENCY DEPARTMENT Provider Note   CSN: [id number] Arrival date & time: 05/29/18  9528     History   Chief Complaint Chief Complaint  Patient presents with  . Chest Pain  . Altered Mental Status    HPI Sheila [name] is a 67 y.o. female.  HPI Patient presents from nursing facility with staff concern of altered mental status, and possibly chest pain. Patient has a notable history of a aphasia, he is total care dependent, in a nursing facility Reportedly the patient communicated chest pain, and was less interactive today. Notably, patient was recently discharged from this facility. No report of new fever, fall, vomiting at the facility. Level 5 caveat secondary to the patient's a phasic state  Past Medical History:  Diagnosis Date  . Anxiety   . Aphasia   . Arthritis   . Diabetes mellitus    complicated by peripheral neuropathy  . Dysphagia following nontraumatic intracerebral hemorrhage   . Hyperlipidemia   . Muscle weakness (generalized)   . Stroke [facility]) 2015   residual right foot drop    Patient Active Problem List   Diagnosis Date Noted  . Encephalopathy acute 05/26/2018  . Intraventricular hemorrhage ([place]) 01/05/2018  . Hypertensive crisis   . Lethargic 05/05/2017  . Low blood sugar 05/05/2017  . Nail abnormalities 12/31/2016  . Memory changes 09/16/2016  . Multiple falls 09/16/2016  . UTI (urinary tract infection) 02/28/2016  . Depression 02/28/2016  . Acute ischemic VBA thalamic stroke ([place])   . Stroke (cerebrum) ([place])   . Anomia 02/17/2016  . Stroke ([place]) 02/17/2016  . Peripheral arterial disease ([place]) 12/03/2015  . Neuropathy 09/05/2015  . Medicare annual wellness visit, subsequent 03/12/2015  . Routine general medical examination at a health care facility 10/08/2014  . Cerumen impaction 10/08/2014  . Low back pain potentially associated with radiculopathy 03/01/2013  . Hyperlipidemia 02/15/2012  . Type 2  diabetes mellitus with neurologic complication ([place]) 41/32/4401    Past Surgical History:  Procedure Laterality Date  . CHOLECYSTECTOMY    . TONSILLECTOMY       OB History   None      Home Medications    Prior to Admission medications   Medication Sig Start Date End Date Taking? Authorizing Provider  acetaminophen (TYLENOL) 325 MG tablet Take 650 mg by mouth every 4 (four) hours as needed for mild pain, moderate pain or headache.   Yes [provider]  Amantadine HCl 100 MG tablet Take 50 mg by mouth 2 (two) times daily.   Yes [provider]  atorvastatin (LIPITOR) 20 MG tablet Take 1 tablet (20 mg total) by mouth daily at 6 PM. Patient taking differently: Take 20 mg by mouth daily.  01/10/18  Yes Costello, Kayren Eaves, NP  divalproex (DEPAKOTE) 125 MG DR tablet Take 125 mg by mouth 3 (three) times daily with meals.    Yes [provider]  Dulaglutide (TRULICITY) 0.27 OZ/3.6UY SOPN Inject 0.75 mg into the skin every Friday. 06/02/18  Yes Emokpae, Courage, MD  fish oil-omega-3 fatty acids 1000 MG capsule Take 1 g by mouth daily.   Yes [provider]  gabapentin (NEURONTIN) 300 MG capsule Take 1 capsule by mouth at  bedtime 12/16/15  Yes Golden Circle, FNP  Insulin Glargine (LANTUS SOLOSTAR) 100 UNIT/ML Solostar Pen Inject 5 Units into the skin at bedtime. 05/27/18  Yes Emokpae, Courage, MD  LORazepam (ATIVAN) 0.5 MG tablet Take 1 tablet (0.5 mg total)  by mouth 2 (two) times daily as needed for anxiety or sleep. 05/27/18  Yes Emokpae, Courage, MD  magnesium oxide (MAG-OX) 400 (241.3 Mg) MG tablet Take 1 tablet (400 mg total) by mouth daily. 01/10/18  Yes Costello, Kayren Eaves, NP  Multiple Vitamin (MULTIVITAMIN WITH MINERALS) TABS tablet Take 1 tablet by mouth daily.   Yes [provider]  potassium chloride (K-DUR) 10 MEQ tablet Take 1 tablet (10 mEq total) by mouth daily. 01/10/18  Yes Costello, Mary A, NP  Insulin Pen Needle 31G X 6 MM MISC Use as  needed to inject insulin in the skin. 06/24/17   Golden Circle, FNP    Family History Family History  Problem Relation Age of Onset  . Diabetes Mother        Deceased  . Diabetes Brother   . Healthy Son     Social History Social History   Tobacco Use  . Smoking status: Former Smoker    Types: Cigarettes    Last attempt to quit: 02/15/1992    Years since quitting: 26.3  . Smokeless tobacco: Never Used  Substance Use Topics  . Alcohol use: No    Alcohol/week: 0.0 oz    Comment: Socially  . Drug use: No    Types: Marijuana    Comment: SMOKES IT ANY TIME SHE CAN GET IT     Allergies   Sulfa antibiotics; Paxil [paroxetine hcl]; and Iopamidol   Review of Systems Review of Systems  Unable to perform ROS: Patient nonverbal  Constitutional:       Per HPI, otherwise negative  HENT:       Per HPI, otherwise negative  Respiratory:       Per HPI, otherwise negative  Cardiovascular:       Per HPI, otherwise negative  Gastrointestinal: Negative for vomiting.  Endocrine:       Negative aside from HPI  Genitourinary:       Neg aside from HPI   Musculoskeletal:       Per HPI, otherwise negative  Skin: Negative.   Neurological: Negative for syncope.     Physical Exam Updated Vital Signs BP 130/68   Pulse 90   Temp 98.6 F (37 C) (Oral)   Resp (!) 31   Ht 5\' 3"  (1.6 m)   Wt 73 kg (161 lb)   SpO2 98%   BMI 28.52 kg/m   Physical Exam  Constitutional: She appears well-developed and well-nourished. She does not appear ill. No distress.  Patient is somnolent but will open her eyes and give minimal nods or mouthing yes responses to all questions, inconsistently  HENT:  Head: Normocephalic and atraumatic.  Mouth/Throat: Oropharynx is clear and moist.  Mucous membranes are dry and food still present in the mouth  Eyes: Pupils are equal, round, and reactive to light. Conjunctivae and EOM are normal.  Neck: Neck supple.  Cardiovascular: Normal rate, regular rhythm  and intact distal pulses.  No murmur heard. Pulmonary/Chest: Effort normal and breath sounds normal. No respiratory distress. She has no wheezes. She has no rales.  Abdominal: Soft. She exhibits no distension. There is no tenderness. There is no rebound and no guarding.  Musculoskeletal: She exhibits no edema or tenderness.  No obvious new deformities  Neurological:  Patient is not following commands.  She is not moving extremities at request.  She does respond to questions appropriately.  Skin: Skin is warm and dry. No rash noted. No erythema.  Psychiatric:  Severe impairment  Nursing note and vitals reviewed.    ED Treatments / Results  Labs (all labs ordered are listed, but only abnormal results are displayed) Labs Reviewed  COMPREHENSIVE METABOLIC PANEL - Abnormal; Notable for the following components:      Result Value   Sodium 134 (*)    CO2 21 (*)    Glucose, Bld 151 (*)    Total Protein 6.1 (*)    Albumin 3.1 (*)    All other components within normal limits  CBC WITH DIFFERENTIAL/PLATELET - Abnormal; Notable for the following components:   HCT 35.0 (*)    All other components within normal limits  URINALYSIS, COMPLETE (UACMP) WITH MICROSCOPIC - Abnormal; Notable for the following components:   APPearance HAZY (*)    Ketones, ur 5 (*)    Bacteria, UA RARE (*)    All other components within normal limits  CBG MONITORING, ED - Abnormal; Notable for the following components:   Glucose-Capillary 148 (*)    All other components within normal limits  URINE CULTURE  PROTIME-INR  I-STAT CG4 LACTIC ACID, ED    EKG EKG Interpretation  Date/Time:  Monday May 29 2018 07:02:32 EDT Ventricular Rate:  90 PR Interval:    QRS Duration: 97 QT Interval:  351 QTC Calculation: 430 R Axis:   0 Text Interpretation:  Sinus rhythm Nonspecific T abnormalities, lateral leads Artifact Abnormal ekg Confirmed by Carmin Muskrat [phone]) on 05/29/2018 8:21:35 AM   Radiology Ct Head Wo  Contrast  Result Date: 05/29/2018 CLINICAL DATA:  Worsening of mental status over the last 3 days. History of previous stroke. EXAM: CT HEAD WITHOUT CONTRAST TECHNIQUE: Contiguous axial images were obtained from the base of the skull through the vertex without intravenous contrast. COMPARISON:  MRI 05/26/2018.  CT 05/26/2018. FINDINGS: Brain: Chronic generalized atrophy with advanced chronic small-vessel ischemic changes throughout the cerebral hemispheric white matter. No sign of acute infarction, mass lesion, hemorrhage, hydrocephalus or extra-axial collection. Vascular: There is atherosclerotic calcification of the major vessels at the base of the brain. Skull: Normal Sinuses/Orbits: Clear/normal Other: None IMPRESSION: No acute finding by CT. Generalized atrophy with extensive chronic small-vessel ischemic changes throughout the brain. Electronically Signed   By: Nelson Chimes M.D.   On: 05/29/2018 08:22   Dg Chest Port 1 View  Result Date: 05/29/2018 CLINICAL DATA:  Altered mental status, history stroke, diabetes mellitus EXAM: PORTABLE CHEST 1 VIEW COMPARISON:  Portable exam 0712 hours compared to 05/26/2018 FINDINGS: Rotated to the LEFT. Normal heart size, mediastinal contours, and pulmonary vascularity. Decreased lung volumes with bibasilar atelectasis. Remaining lungs clear. No pleural effusion or pneumothorax. Bones demineralized. IMPRESSION: Bibasilar atelectasis. Electronically Signed   By: Lavonia Dana M.D.   On: 05/29/2018 07:23    Procedures Procedures (including critical care time)  Medications Ordered in ED Medications  sodium chloride 0.9 % bolus 1,000 mL (1,000 mLs Intravenous New Bag/Given 05/29/18 0749)    And  0.9 %  sodium chloride infusion (has no administration in time range)     Initial Impression / Assessment and Plan / ED Course  I have reviewed the triage vital signs and the nursing notes.  Pertinent labs & imaging results that were available during my care of the  patient were reviewed by me and considered in my medical decision making (see chart for details).   Initial evaluation I reviewed the patient's chart including substantial evaluation including MRI within the past 5 days.  MRI results as below:  IMPRESSION: 1. No acute  intracranial abnormality. 2. Advanced cerebral atrophy with chronic small vessel ischemic disease with remote lacunar infarcts involving the left basal ganglia, left thalamus, and left paramedian pons.     Electronically Signed   By: Jeannine Boga M.D.   On: 05/26/2018 21:50   Findings then generally reassuring.  On repeat exam patient is awake, alert, in no distress.  Today's findings reassuring, with unremarkable labs, urinalysis, lactic acid, and she is remained hemodynamically stable, in no distress throughout. With recent MRI, and today's CT scan, as well as x-ray reassuring, low suspicion for acute new pathology, no evidence for ongoing coronary ischemia, or obvious new stroke.  Though the patient has multiple medical issues, and his baseline a phasic, absent evidence for distress, noted change from baseline interactivity, low suspicion for occult acute new conditions. Patient discharged back to nursing facility in stable condition.   Final Clinical Impressions(s) / ED Diagnoses  Altered mental status   Carmin Muskrat, MD 05/29/18 [phone]

## 2018-05-29 NOTE — ED Notes (Signed)
IV team at bedside 

## 2018-05-29 NOTE — ED Notes (Signed)
Report called to Delcie Roch at [facility] at [place] - informed that pt was going to be d/c'd from ED and transported back to them - [facility] notified of need for transport per ED Secretary

## 2018-05-29 NOTE — ED Notes (Signed)
Lights dimmed and warm blankets applied for comfort

## 2018-05-29 NOTE — ED Provider Notes (Signed)
[place] EMERGENCY DEPARTMENT Provider Note   CSN: [id number] Arrival date & time:        History   Chief Complaint Chief Complaint  Patient presents with  . Altered Mental Status    HPI Sheila [name] is a 67 y.o. female.  HPI Level 5 caveat due to altered mental status.  Patient discharged from the ED around 2 hours ago sent in for encephalopathy.  Had gone back to nursing home and reportedly sent in because she is still not back at baseline.  Reported baseline is alert and oriented x2 and verbal although she does have reported history of a aphasia.  Now patient is minimally verbal and will not speak to me.  Reportedly did tell the nurse that she is at the hospital.  Recent admission for encephalopathy thought to be due to too much benzodiazepine.  Appears she is still on this although she has had a decreased dose. Past Medical History:  Diagnosis Date  . Anxiety   . Aphasia   . Arthritis   . Diabetes mellitus    complicated by peripheral neuropathy  . Dysphagia following nontraumatic intracerebral hemorrhage   . Hyperlipidemia   . Muscle weakness (generalized)   . Stroke [facility]) 2015   residual right foot drop    Patient Active Problem List   Diagnosis Date Noted  . Encephalopathy acute 05/26/2018  . Intraventricular hemorrhage ([place]) 01/05/2018  . Hypertensive crisis   . Lethargic 05/05/2017  . Low blood sugar 05/05/2017  . Nail abnormalities 12/31/2016  . Memory changes 09/16/2016  . Multiple falls 09/16/2016  . UTI (urinary tract infection) 02/28/2016  . Depression 02/28/2016  . Acute ischemic VBA thalamic stroke ([place])   . Stroke (cerebrum) ([place])   . Anomia 02/17/2016  . Stroke ([place]) 02/17/2016  . Peripheral arterial disease ([place]) 12/03/2015  . Neuropathy 09/05/2015  . Medicare annual wellness visit, subsequent 03/12/2015  . Routine general medical examination at a health care facility 10/08/2014  . Cerumen impaction 10/08/2014  .  Low back pain potentially associated with radiculopathy 03/01/2013  . Hyperlipidemia 02/15/2012  . Type 2 diabetes mellitus with neurologic complication ([place]) 81/85/6314    Past Surgical History:  Procedure Laterality Date  . CHOLECYSTECTOMY    . TONSILLECTOMY       OB History   None      Home Medications    Prior to Admission medications   Medication Sig Start Date End Date Taking? Authorizing Provider  acetaminophen (TYLENOL) 325 MG tablet Take 650 mg by mouth every 4 (four) hours as needed for mild pain, moderate pain or headache.   Yes [provider]  Amantadine HCl 100 MG tablet Take 50 mg by mouth 2 (two) times daily.   Yes [provider]  atorvastatin (LIPITOR) 20 MG tablet Take 1 tablet (20 mg total) by mouth daily at 6 PM. Patient taking differently: Take 20 mg by mouth daily.  01/10/18  Yes Costello, Kayren Eaves, NP  divalproex (DEPAKOTE) 125 MG DR tablet Take 125 mg by mouth 3 (three) times daily with meals.    Yes [provider]  Dulaglutide (TRULICITY) 9.68 YO/3.7CH SOPN Inject 0.75 mg into the skin every Friday. 06/02/18  Yes Emokpae, Courage, MD  fish oil-omega-3 fatty acids 1000 MG capsule Take 1 g by mouth daily.   Yes [provider]  gabapentin (NEURONTIN) 300 MG capsule Take 1 capsule by mouth at  bedtime 12/16/15  Yes Golden Circle, FNP  Insulin Glargine (LANTUS SOLOSTAR) 100 UNIT/ML Solostar Pen Inject 5 Units into the skin at bedtime. 05/27/18  Yes Emokpae, Courage, MD  Insulin Pen Needle 31G X 6 MM MISC Use as needed to inject insulin in the skin. 06/24/17  Yes Golden Circle, FNP  LORazepam (ATIVAN) 0.5 MG tablet Take 1 tablet (0.5 mg total) by mouth 2 (two) times daily as needed for anxiety or sleep. 05/27/18  Yes Emokpae, Courage, MD  magnesium oxide (MAG-OX) 400 (241.3 Mg) MG tablet Take 1 tablet (400 mg total) by mouth daily. 01/10/18  Yes Costello, Kayren Eaves, NP  Multiple Vitamin (MULTIVITAMIN WITH MINERALS) TABS tablet Take  1 tablet by mouth daily.   Yes [provider]  potassium chloride (K-DUR) 10 MEQ tablet Take 1 tablet (10 mEq total) by mouth daily. 01/10/18  Yes Costello, Kayren Eaves, NP    Family History Family History  Problem Relation Age of Onset  . Diabetes Mother        Deceased  . Diabetes Brother   . Healthy Son     Social History Social History   Tobacco Use  . Smoking status: Former Smoker    Types: Cigarettes    Last attempt to quit: 02/15/1992    Years since quitting: 26.3  . Smokeless tobacco: Never Used  Substance Use Topics  . Alcohol use: No    Alcohol/week: 0.0 oz    Comment: Socially  . Drug use: No    Types: Marijuana    Comment: SMOKES IT ANY TIME SHE CAN GET IT     Allergies   Sulfa antibiotics; Paxil [paroxetine hcl]; and Iopamidol   Review of Systems Review of Systems  Unable to perform ROS: Mental status change     Physical Exam Updated Vital Signs BP 131/73   Pulse 89   Temp 98.5 F (36.9 C) (Oral)   Resp 20   Ht 5\' 6"  (1.676 m)   Wt 73 kg (161 lb)   SpO2 98%   BMI 25.99 kg/m   Physical Exam  Constitutional: She appears well-developed.  HENT:  Head: Atraumatic.  Eyes: Pupils are equal, round, and reactive to light.  Neck: Neck supple.  Cardiovascular: Normal rate.  Pulmonary/Chest: Effort normal.  Abdominal: There is no tenderness.  Musculoskeletal: She exhibits no tenderness.  Neurological:  Patient is sitting in bed looking forward.  Will look somewhat to voice.  Nonverbal for me.  Will however squeeze either hand to command.  Will also release to command.  Will wiggle feet.  Skin: Skin is warm.  Psychiatric: She has a normal mood and affect.     ED Treatments / Results  Labs (all labs ordered are listed, but only abnormal results are displayed) Labs Reviewed  VALPROIC ACID LEVEL    EKG None  Radiology Ct Head Wo Contrast  Result Date: 05/29/2018 CLINICAL DATA:  Worsening of mental status over the last 3 days. History  of previous stroke. EXAM: CT HEAD WITHOUT CONTRAST TECHNIQUE: Contiguous axial images were obtained from the base of the skull through the vertex without intravenous contrast. COMPARISON:  MRI 05/26/2018.  CT 05/26/2018. FINDINGS: Brain: Chronic generalized atrophy with advanced chronic small-vessel ischemic changes throughout the cerebral hemispheric white matter. No sign of acute infarction, mass lesion, hemorrhage, hydrocephalus or extra-axial collection. Vascular: There is atherosclerotic calcification of the major vessels at the base of the brain. Skull: Normal Sinuses/Orbits: Clear/normal Other: None IMPRESSION: No acute finding by CT. Generalized atrophy with extensive chronic small-vessel ischemic changes throughout  the brain. Electronically Signed   By: Nelson Chimes M.D.   On: 05/29/2018 08:22   Dg Chest Port 1 View  Result Date: 05/29/2018 CLINICAL DATA:  Altered mental status, history stroke, diabetes mellitus EXAM: PORTABLE CHEST 1 VIEW COMPARISON:  Portable exam 0712 hours compared to 05/26/2018 FINDINGS: Rotated to the LEFT. Normal heart size, mediastinal contours, and pulmonary vascularity. Decreased lung volumes with bibasilar atelectasis. Remaining lungs clear. No pleural effusion or pneumothorax. Bones demineralized. IMPRESSION: Bibasilar atelectasis. Electronically Signed   By: Lavonia Dana M.D.   On: 05/29/2018 07:23    Procedures Procedures (including critical care time)  Medications Ordered in ED Medications - No data to display   Initial Impression / Assessment and Plan / ED Course  I have reviewed the triage vital signs and the nursing notes.  Pertinent labs & imaging results that were available during my care of the patient were reviewed by me and considered in my medical decision making (see chart for details).     Patient with encephalopathy.  Recent admission for same that was thought to be due to benzodiazepine.  Seen earlier and discharged but reviewing records she  may not have been at her baseline and was quickly return to the ER.  Will discuss with hospitalist about further observation to see if she can return to her baseline.  Final Clinical Impressions(s) / ED Diagnoses   Final diagnoses:  Encephalopathy    ED Discharge Orders    None       Davonna Belling, MD 05/29/18 1701

## 2018-05-29 NOTE — ED Triage Notes (Signed)
Per [facility], patient from [facility] with reports of chest pain and a new onset AMS for three days. No hx of dementia. [place] gave 324mg  of aspirin before arrival.

## 2018-05-30 ENCOUNTER — Inpatient Hospital Stay (HOSPITAL_COMMUNITY): Payer: Medicare Other

## 2018-05-30 DIAGNOSIS — R4701 Aphasia: Secondary | ICD-10-CM

## 2018-05-30 DIAGNOSIS — F419 Anxiety disorder, unspecified: Secondary | ICD-10-CM

## 2018-05-30 DIAGNOSIS — R4182 Altered mental status, unspecified: Secondary | ICD-10-CM

## 2018-05-30 LAB — URINE CULTURE: Special Requests: NORMAL

## 2018-05-30 LAB — GLUCOSE, CAPILLARY
GLUCOSE-CAPILLARY: 140 mg/dL — AB (ref 65–99)
GLUCOSE-CAPILLARY: 57 mg/dL — AB (ref 65–99)
Glucose-Capillary: 93 mg/dL (ref 65–99)
Glucose-Capillary: 102 mg/dL — ABNORMAL HIGH (ref 65–99)
Glucose-Capillary: 108 mg/dL — ABNORMAL HIGH (ref 65–99)
Glucose-Capillary: 54 mg/dL — ABNORMAL LOW (ref 65–99)
Glucose-Capillary: 148 mg/dL — ABNORMAL HIGH (ref 65–99)
Glucose-Capillary: 162 mg/dL — ABNORMAL HIGH (ref 65–99)

## 2018-05-30 LAB — COMPREHENSIVE METABOLIC PANEL
ALBUMIN: 2.9 g/dL — AB (ref 3.5–5.0)
ALK PHOS: 98 U/L (ref 38–126)
ALT: 18 U/L (ref 14–54)
AST: 25 U/L (ref 15–41)
Anion gap: 11 (ref 5–15)
BUN: 10 mg/dL (ref 6–20)
CALCIUM: 8.9 mg/dL (ref 8.9–10.3)
CHLORIDE: 106 mmol/L (ref 101–111)
CO2: 20 mmol/L — AB (ref 22–32)
CREATININE: 0.94 mg/dL (ref 0.44–1.00)
GFR calc Af Amer: >60 mL/min (ref >60)
GFR calc non Af Amer: >60 mL/min (ref >60)
GLUCOSE: 129 mg/dL — AB (ref 65–99)
Potassium: 4.3 mmol/L (ref 3.5–5.1)
Sodium: 137 mmol/L (ref 135–145)
Total Bilirubin: 0.7 mg/dL (ref 0.3–1.2)
Total Protein: 5.6 g/dL — ABNORMAL LOW (ref 6.5–8.1)

## 2018-05-30 LAB — CBC
HCT: 34.4 % — ABNORMAL LOW (ref 36.0–46.0)
HEMOGLOBIN: 12.1 g/dL (ref 12.0–15.0)
MCH: 30.9 pg (ref 26.0–34.0)
MCHC: 35.2 g/dL (ref 30.0–36.0)
MCV: 87.8 fL (ref 78.0–100.0)
PLATELETS: 322 10*3/uL (ref 150–400)
RBC: 3.92 MIL/uL (ref 3.87–5.11)
RDW: 12.5 % (ref 11.5–15.5)
WBC: 6.7 10*3/uL (ref 4.0–10.5)

## 2018-05-30 LAB — GLUCOSE, RANDOM: GLUCOSE: 198 mg/dL — AB (ref 65–99)

## 2018-05-30 LAB — VITAMIN B12: VITAMIN B 12: 1155 pg/mL — AB (ref 180–914)

## 2018-05-30 LAB — TSH: TSH: 1.5 u[IU]/mL (ref 0.350–4.500)

## 2018-05-30 MED ORDER — ACETAMINOPHEN 325 MG PO TABS
650.0000 mg | ORAL_TABLET | Freq: Four times a day (QID) | ORAL | Status: DC | PRN
Start: 2018-05-30 — End: 2018-06-01

## 2018-05-30 MED ORDER — DEXTROSE-NACL 5-0.45 % IV SOLN
INTRAVENOUS | Status: DC
  Administered 2018-05-30 – 2018-06-01 (×4): via INTRAVENOUS

## 2018-05-30 MED ORDER — LORAZEPAM 2 MG/ML IJ SOLN
0.5000 mg | Freq: Once | INTRAMUSCULAR | Status: AC
  Administered 2018-05-30: 0.5 mg via INTRAVENOUS
  Filled 2018-05-30: qty 1

## 2018-05-30 MED ORDER — DEXTROSE 50 % IV SOLN
INTRAVENOUS | Status: AC
  Administered 2018-05-30: 12:00:00
  Filled 2018-05-30: qty 50

## 2018-05-30 MED ORDER — GABAPENTIN 100 MG PO CAPS
100.0000 mg | ORAL_CAPSULE | Freq: Every day | ORAL | Status: DC
  Administered 2018-05-31: 100 mg via ORAL
  Filled 2018-05-30: qty 1

## 2018-05-30 MED ORDER — LORAZEPAM 0.5 MG PO TABS
0.5000 mg | ORAL_TABLET | Freq: Every day | ORAL | Status: DC
  Administered 2018-05-31: 0.5 mg via ORAL
  Filled 2018-05-30: qty 1

## 2018-05-30 NOTE — Procedures (Signed)
ELECTROENCEPHALOGRAM REPORT  Date of Study: 05/30/2018  Patient's Name: Sheila [name] MRN: [id number] Date of Birth: [birth year]  Referring Provider: Dr. Domenic Polite  Clinical History: This is a 67 year old woman with altered mental status  Medications: Gabapentin Ativan  Technical Summary: A multichannel digital EEG recording measured by the international 10-20 system with electrodes applied with paste and impedances below 5000 ohms performed as portable with EKG monitoring in an awake and asleep patient.  Hyperventilation and photic stimulation were not performed.  The digital EEG was referentially recorded, reformatted, and digitally filtered in a variety of bipolar and referential montages for optimal display.   Description: The patient is awake and asleep during the recording.  The record is significantly obscured by muscle artifact during wakefulness. During brief periods where jaw is relaxed, there is a 5-6 Hz posterior dominant rhythm seen, admixed with diffuse 4-5 Hz theta and occasional 2-3 Hz delta slowing seen. During drowsiness and sleep, there is an increase in theta slowing of the background with poorly formed vertex waves seen.  Hyperventilation and photic stimulation were not performed. In between artifact, there were no epileptiform discharges or electrographic seizures seen.    EKG lead was unremarkable.  Impression: This awake and asleep EEG is abnormal due to mild to moderate diffuse background slowing.  Clinical Correlation of the above findings indicates diffuse cerebral dysfunction that is non-specific in etiology and can be seen with hypoxic/ischemic injury, toxic/metabolic encephalopathies, neurodegenerative disorders, or medication effect. This study is slightly limited due to significant muscle artifact obscuring EEG during wakefulness. In between artifact, there were no epileptiform discharges seen. The absence of epileptiform discharges does not rule out  a clinical diagnosis of epilepsy.  Clinical correlation is advised.   Ellouise Newer, M.D.

## 2018-05-30 NOTE — Progress Notes (Signed)
EEG completed bedside full report to follow. °

## 2018-05-30 NOTE — Progress Notes (Signed)
Upon assessment patient was tearful and respirations were in the 40's. Patient unable to verbalize pain and is showing no signs of distress. Patient 100% on room air. VS stable. Kirby,NP notified and she placed a one time order for 0.5 IV ativan due to the patient being unable to take her PO dose of ativan. After receiving the ativan patient's respirations normalized and returned to 20. Will continue to monitor and treat per MD orders.

## 2018-05-30 NOTE — Progress Notes (Signed)
Initial Nutrition Assessment  DOCUMENTATION CODES:   Not applicable  INTERVENTION:  Monitor for needs and diet advancement  NUTRITION DIAGNOSIS:   Inadequate oral intake related to inability to eat as evidenced by NPO status.  GOAL:   Patient will meet greater than or equal to 90% of their needs  MONITOR:   Diet advancement  REASON FOR ASSESSMENT:   Low Braden    ASSESSMENT:   Patient with PMH of prior ICH, prior CVAs with resultant partial global aphasia, diabetes mellitus, diabetic neuropathy, PAD, hyperlipidemia, who is being brought to the hospital from her SNF due to acute encephalopathy  RD drawn to patient for low braden. Attempted to speak with patient at bedside but she is unable to provide any history, only mumbles. Per chart review, patient is wheelchair bound at baseline, likely explains moderate muscle wasting in lower extremities. Previously in January, was on NDD1-Pureed diet. Currently NPO, will monitor diet advancement. She was evaluated by Speech today, they are awaiting her to have more alertness to consume PO.  Labs reviewed Medications reviewed and include:  D5 1/2 NS at 75Ml/hr --> 306 calories Insulin   NUTRITION - FOCUSED PHYSICAL EXAM:    Most Recent Value  Orbital Region  No depletion  Upper Arm Region  No depletion  Thoracic and Lumbar Region  No depletion  Buccal Region  No depletion  Temple Region  No depletion  Clavicle Bone Region  No depletion  Clavicle and Acromion Bone Region  No depletion  Scapular Bone Region  No depletion  Dorsal Hand  No depletion  Patellar Region  Moderate depletion  Anterior Thigh Region  Moderate depletion  Posterior Calf Region  Moderate depletion       Diet Order:   Diet Order           Diet NPO time specified  Diet effective now          EDUCATION NEEDS:   Not appropriate for education at this time  Skin:  Skin Assessment: Reviewed RN Assessment(Ecchymosis to L anus, R hand)  Last BM:   5/31  Height:   Ht Readings from Last 1 Encounters:  05/29/18 5\' 6"  (1.676 m)    Weight:   Wt Readings from Last 1 Encounters:  05/29/18 161 lb (73 kg)    Ideal Body Weight:  59.09 kg  BMI:  Body mass index is 25.99 kg/m.  Estimated Nutritional Needs:   Kcal:  1700-2000 calories  Protein:  73-110 grams  Fluid:  >1.7L  Satira Anis. Amori Cooperman, MS, RD LDN Inpatient Clinical Dietitian Pager [phone]

## 2018-05-30 NOTE — Progress Notes (Signed)
PROGRESS NOTE    Sheila [name]  XQJ:[id number] DOB: 1951/06/19 DOA: 05/29/2018 PCP: [facility], FNP  Brief Narrative: 67 year old female with history of 3 prior strokes, after the second stroke in 01/2016 she had a residual right foot drop and cognitive decline had been staying in an assisted living facility since then. -she was admitted in January/2019 with acute left-sided weakness and confusion, administered IV TPA on 1/9, this was complicated by intraventricular hemorrhage, treated conservatively, has had residual global aphasia, intermittent confusion and some cognitive decline, she was discharged to skilled nursing facility on 1/15 on low-dose Ativan for agitation, at the time of discharge she was cooperative with staff however nonverbal and required assistance in all ADLs. -Since then she has been started on low-dose Depakote and amantadine, was subsequently discharged from SNF to [facility] assisted living facility a few months ago. -At baseline can mumble yes and no, is usually alert and has evening agitation/sundowning, wheelchair-bound. -She was sent to the emergency room on 5/31,  5 days ago, with lethargy compared to baseline, hold workup was negative including MRI which did not show any acute findings, her Ativan dose was cut down from 1 mg twice a day 2.5 mg twice a day and discharged back, she also had an EEG done which was negative for epileptiform discharges  Assessment & Plan:     Encephalopathy -admitted 4 days ago, with same complaints ie more lethargy compared to baseline -At baseline can mumble yes and no, is usually alert and has evening agitation/sundowning, wheelchair-bound. -no fever, no leukocytosis, text x-ray, urinalysis, CT head without acute findings -Had a long conversation with her son reports she is able to mumble yes and no usually, I do not think she is very far from her baseline -I have cut down Ativan dose from 0.5 mg twice a day to 0.5 mg daily at  bedtime and cutdown gabapentin dose to daily at bedtime as well -Repeat EEG -MRI brain from 6/1 reviewed, no acute changes noted, advanced cerebral atrophy with multiple prior strokes and chronic small vessel disease noted, I suspect she has vascular dementia with ongoing functional decline, cognitive decline, short-term memory loss, Complicated by global aphasia from recent stroke -We'll also check TSH and B-12 levels -SLP following, they will reassess and hopefully start diet tomorrow -PT OT evaluation -May need higher level of care at discharge -Patient has had multiple ER visits with falls since 12/2017   Right knee swelling -Likely sequelae of multiple recent falls, clinically no signs or symptoms of infection, CT notes a small effusion, supportive care   History of multiple CVAs -Most recently intraventricular hemorrhage following TPA -Has global aphasia, some dysphagia, cognitive decline, suspected to have underlying dementia as well as -Follow-up with neurology    Dysphagia following nontraumatic intracerebral hemorrhage -In January SLP documented patient required dysphagia 1/pured diet, okay to use straws, required constant supervision with eating -SLP following, NPO, IVF for now, hopefully re-assess in next 1-2days and start diet    Diabetes mellitus type 2, insulin dependent  -Hold low-dose Lantus and Trulicity  -SSI    Anxiety/depression -suspect this is also contributing to current presentation however would not want to introduce psychotropic meds acutely in the setting of lethargy and ongoing cognitive decline   DVT prophylaxis: Lovenox Code Status: Full Family Communication: son at bedside Disposition Plan: suspect she might need higher level of care than [name] I.e.SNF    Consultants:      Procedures:   Antimicrobials:  Subjective: -remains nonverbal, opens eyes, looks at me, would not follow commands -No events overnight  Objective: Vitals:    05/29/18 2122 05/29/18 2230 05/30/18 0438 05/30/18 0753  BP:   138/78 120/70  Pulse:   87 84  Resp:   20 18  Temp:   98.1 F (36.7 C) 98.7 F (37.1 C)  TempSrc:   Oral Oral  SpO2: (!) 83% 100% 100% 100%  Weight:      Height:        Intake/Output Summary (Last 24 hours) at 05/30/2018 1349 Last data filed at 05/30/2018 2330 Gross per 24 hour  Intake 990 ml  Output -  Net 990 ml   Filed Weights   05/29/18 1646  Weight: 73 kg (161 lb)    Examination:  General exam: awake, eyes open, unable to assess alertness, would not follow commands Respiratory system: clear bilaterally Cardiovascular system: S1 & S2 heard, RRR.  Gastrointestinal system: Abdomen is nondistended, soft and nontender.Normal bowel sounds heard. Central nervous system: eyes open, smiles, nonverbal, moves all extremities voluntarily, would not follow commands Extremities: no edema Skin: No rashes, lesions or ulcers Psychiatry: flat affect, unable to assess    Data Reviewed:   CBC: Recent Labs  Lab 05/26/18 1219 05/27/18 0813 05/29/18 0733 05/30/18 0410  WBC 6.1 5.2 5.7 6.7  NEUTROABS 4.2  --  3.3  --   HGB 14.5 11.8* 12.3 12.1  HCT 40.5 33.6* 35.0* 34.4*  MCV 84.6 85.3 85.8 87.8  PLT 393 350 359 076   Basic Metabolic Panel: Recent Labs  Lab 05/26/18 1219 05/26/18 2230 05/27/18 0813 05/29/18 0733 05/29/18 1932 05/30/18 0410  NA 128* 132* 134* 134*  --  137  K 5.7* 5.0 4.5 4.8  --  4.3  CL 93* 100* 104 104  --  106  CO2 23 23 20* 21*  --  20*  GLUCOSE 141* 104* 83 151*  --  129*  BUN 23* 20 15 14   --  10  CREATININE 1.41* 1.22* 0.91 0.90  --  0.94  CALCIUM 9.4 8.9 9.5 9.3  --  8.9  MG  --   --   --   --  1.6*  --   PHOS  --   --   --   --  3.4  --    GFR: Estimated Creatinine Clearance: 58.6 mL/min (by C-G formula based on SCr of 0.94 mg/dL). Liver Function Tests: Recent Labs  Lab 05/26/18 1219 05/26/18 2230 05/29/18 0733 05/30/18 0410  AST 49* 39 29 25  ALT 23 21 19 18   ALKPHOS  100 97 98 98  BILITOT 1.1 1.1 0.6 0.7  PROT 6.9 6.4* 6.1* 5.6*  ALBUMIN 3.4* 3.2* 3.1* 2.9*   No results for input(s): LIPASE, AMYLASE in the last 168 hours. Recent Labs  Lab 05/26/18 1448  AMMONIA 36*   Coagulation Profile: Recent Labs  Lab 05/29/18 0733  INR 1.08   Cardiac Enzymes: No results for input(s): CKTOTAL, CKMB, CKMBINDEX, TROPONINI in the last 168 hours. BNP (last 3 results) No results for input(s): PROBNP in the last 8760 hours. HbA1C: No results for input(s): HGBA1C in the last 72 hours. CBG: Recent Labs  Lab 05/30/18 0433 05/30/18 0801 05/30/18 1216 05/30/18 1240 05/30/18 1241  GLUCAP 93 102* 57* 54* 108*   Lipid Profile: No results for input(s): CHOL, HDL, LDLCALC, TRIG, CHOLHDL, LDLDIRECT in the last 72 hours. Thyroid Function Tests: No results for input(s): TSH, T4TOTAL, FREET4, T3FREE, THYROIDAB in the last  72 hours. Anemia Panel: No results for input(s): VITAMINB12, FOLATE, FERRITIN, TIBC, IRON, RETICCTPCT in the last 72 hours. Urine analysis:    Component Value Date/Time   COLORURINE YELLOW 05/29/2018 0706   APPEARANCEUR HAZY (A) 05/29/2018 0706   LABSPEC 1.011 05/29/2018 0706   PHURINE 5.0 05/29/2018 0706   GLUCOSEU NEGATIVE 05/29/2018 0706   HGBUR NEGATIVE 05/29/2018 0706   BILIRUBINUR NEGATIVE 05/29/2018 0706   BILIRUBINUR Neg 01/22/2014 1656   KETONESUR 5 (A) 05/29/2018 0706   PROTEINUR NEGATIVE 05/29/2018 0706   UROBILINOGEN 0.2 01/22/2014 1656   NITRITE NEGATIVE 05/29/2018 0706   LEUKOCYTESUR NEGATIVE 05/29/2018 0706   Sepsis Labs: @LABRCNTIP (procalcitonin:4,lacticidven:4)  ) Recent Results (from the past 240 hour(s))  Urine culture     Status: Abnormal   Collection Time: 05/29/18  7:52 AM  Result Value Ref Range Status   Specimen Description URINE, RANDOM  Final   Special Requests   Final    unknown Normal Performed at [facility], [place] [address] [place], [place] [address]    Culture MULTIPLE SPECIES PRESENT,  SUGGEST RECOLLECTION (A)  Final   Report Status 05/30/2018 FINAL  Final  MRSA PCR Screening     Status: None   Collection Time: 05/29/18  8:47 PM  Result Value Ref Range Status   MRSA by PCR NEGATIVE NEGATIVE Final    Comment:        The GeneXpert MRSA Assay (FDA approved for NASAL specimens only), is one component of a comprehensive MRSA colonization surveillance program. It is not intended to diagnose MRSA infection nor to guide or monitor treatment for MRSA infections. Performed at [facility], [place] [address] [place], [place] [address]          Radiology Studies: Ct Head Wo Contrast  Result Date: 05/29/2018 CLINICAL DATA:  Worsening of mental status over the last 3 days. History of previous stroke. EXAM: CT HEAD WITHOUT CONTRAST TECHNIQUE: Contiguous axial images were obtained from the base of the skull through the vertex without intravenous contrast. COMPARISON:  MRI 05/26/2018.  CT 05/26/2018. FINDINGS: Brain: Chronic generalized atrophy with advanced chronic small-vessel ischemic changes throughout the cerebral hemispheric white matter. No sign of acute infarction, mass lesion, hemorrhage, hydrocephalus or extra-axial collection. Vascular: There is atherosclerotic calcification of the major vessels at the base of the brain. Skull: Normal Sinuses/Orbits: Clear/normal Other: None IMPRESSION: No acute finding by CT. Generalized atrophy with extensive chronic small-vessel ischemic changes throughout the brain. Electronically Signed   By: Nelson Chimes M.D.   On: 05/29/2018 08:22   Ct Knee Right Wo Contrast  Result Date: 05/29/2018 CLINICAL DATA:  Right hip, thigh, and knee pain. No history of trauma. EXAM: CT OF THE RIGHT HIP WITHOUT CONTRAST CT OF THE RIGHT FEMUR WITHOUT CONTRAST CT OF THE RIGHT KNEE WITHOUT CONTRAST TECHNIQUE: Multidetector CT imaging of the right hip, femur, and knee was performed according to the standard protocol. Multiplanar CT image reconstructions were  also generated. COMPARISON:  Right hip x-rays dated April 12, 2018. FINDINGS: Bones/Joint/Cartilage No acute fracture or dislocation. Well corticated vertical, slightly concave linear lucency through the lateral aspect of the patella is favored to represent a type 2 bipartite patella. Lateral patellar subluxation. Mild tricompartmental knee joint space narrowing with small subchondral cystic change and marginal osteophytes. Chondrocalcinosis of the medial and lateral menisci. Small knee joint effusion. Mild right hip joint space narrowing with subchondral sclerosis, cystic change, and small femoral head osteophytes. Osteopenia. Ligaments Suboptimally assessed by CT. Muscles and  Tendons Moderate atrophy of the gluteus minimus muscle. The visualized muscles and tendons are otherwise grossly unremarkable. The extensor mechanism is intact. Soft tissues Mild soft tissue stranding in the lateral right hip. No fluid collection or hematoma. No soft tissue mass. Vascular calcifications. IMPRESSION: 1. No acute fracture. Well corticated vertical lucency through the lateral aspect of the patella is favored to represent a type 2 bipartite patella. 2. Lateral patellar subluxation with small knee joint effusion. 3. Mild hip and knee osteoarthritis. 4. Chondrocalcinosis of the menisci, which can be seen with CPPD arthropathy. Electronically Signed   By: Titus Dubin M.D.   On: 05/29/2018 19:42   Ct Hip Right Wo Contrast  Result Date: 05/29/2018 CLINICAL DATA:  Right hip, thigh, and knee pain. No history of trauma. EXAM: CT OF THE RIGHT HIP WITHOUT CONTRAST CT OF THE RIGHT FEMUR WITHOUT CONTRAST CT OF THE RIGHT KNEE WITHOUT CONTRAST TECHNIQUE: Multidetector CT imaging of the right hip, femur, and knee was performed according to the standard protocol. Multiplanar CT image reconstructions were also generated. COMPARISON:  Right hip x-rays dated April 12, 2018. FINDINGS: Bones/Joint/Cartilage No acute fracture or dislocation.  Well corticated vertical, slightly concave linear lucency through the lateral aspect of the patella is favored to represent a type 2 bipartite patella. Lateral patellar subluxation. Mild tricompartmental knee joint space narrowing with small subchondral cystic change and marginal osteophytes. Chondrocalcinosis of the medial and lateral menisci. Small knee joint effusion. Mild right hip joint space narrowing with subchondral sclerosis, cystic change, and small femoral head osteophytes. Osteopenia. Ligaments Suboptimally assessed by CT. Muscles and Tendons Moderate atrophy of the gluteus minimus muscle. The visualized muscles and tendons are otherwise grossly unremarkable. The extensor mechanism is intact. Soft tissues Mild soft tissue stranding in the lateral right hip. No fluid collection or hematoma. No soft tissue mass. Vascular calcifications. IMPRESSION: 1. No acute fracture. Well corticated vertical lucency through the lateral aspect of the patella is favored to represent a type 2 bipartite patella. 2. Lateral patellar subluxation with small knee joint effusion. 3. Mild hip and knee osteoarthritis. 4. Chondrocalcinosis of the menisci, which can be seen with CPPD arthropathy. Electronically Signed   By: Titus Dubin M.D.   On: 05/29/2018 19:42   Ct Femur Right Wo Contrast  Result Date: 05/29/2018 CLINICAL DATA:  Right hip, thigh, and knee pain. No history of trauma. EXAM: CT OF THE RIGHT HIP WITHOUT CONTRAST CT OF THE RIGHT FEMUR WITHOUT CONTRAST CT OF THE RIGHT KNEE WITHOUT CONTRAST TECHNIQUE: Multidetector CT imaging of the right hip, femur, and knee was performed according to the standard protocol. Multiplanar CT image reconstructions were also generated. COMPARISON:  Right hip x-rays dated April 12, 2018. FINDINGS: Bones/Joint/Cartilage No acute fracture or dislocation. Well corticated vertical, slightly concave linear lucency through the lateral aspect of the patella is favored to represent a type 2  bipartite patella. Lateral patellar subluxation. Mild tricompartmental knee joint space narrowing with small subchondral cystic change and marginal osteophytes. Chondrocalcinosis of the medial and lateral menisci. Small knee joint effusion. Mild right hip joint space narrowing with subchondral sclerosis, cystic change, and small femoral head osteophytes. Osteopenia. Ligaments Suboptimally assessed by CT. Muscles and Tendons Moderate atrophy of the gluteus minimus muscle. The visualized muscles and tendons are otherwise grossly unremarkable. The extensor mechanism is intact. Soft tissues Mild soft tissue stranding in the lateral right hip. No fluid collection or hematoma. No soft tissue mass. Vascular calcifications. IMPRESSION: 1. No acute fracture. Well corticated vertical lucency through  the lateral aspect of the patella is favored to represent a type 2 bipartite patella. 2. Lateral patellar subluxation with small knee joint effusion. 3. Mild hip and knee osteoarthritis. 4. Chondrocalcinosis of the menisci, which can be seen with CPPD arthropathy. Electronically Signed   By: Titus Dubin M.D.   On: 05/29/2018 19:42   Dg Chest Port 1 View  Result Date: 05/29/2018 CLINICAL DATA:  Altered mental status, history stroke, diabetes mellitus EXAM: PORTABLE CHEST 1 VIEW COMPARISON:  Portable exam 0712 hours compared to 05/26/2018 FINDINGS: Rotated to the LEFT. Normal heart size, mediastinal contours, and pulmonary vascularity. Decreased lung volumes with bibasilar atelectasis. Remaining lungs clear. No pleural effusion or pneumothorax. Bones demineralized. IMPRESSION: Bibasilar atelectasis. Electronically Signed   By: Lavonia Dana M.D.   On: 05/29/2018 07:23        Scheduled Meds: . enoxaparin (LOVENOX) injection  40 mg Subcutaneous Q24H  . gabapentin  100 mg Oral QHS  . insulin aspart  0-15 Units Subcutaneous Q4H  . LORazepam  0.5 mg Oral QHS  . sodium chloride flush  3 mL Intravenous Q12H   Continuous  Infusions: . dextrose 5 % and 0.45% NaCl 75 mL/hr at 05/30/18 1327     LOS: 1 day    Time spent: 35min    Domenic Polite, MD [facility] Page via [email or web address], password [email or web address] After 7PM please contact night-coverage  05/30/2018, 1:49 PM

## 2018-05-30 NOTE — Progress Notes (Signed)
Pts bedside glucose level 58.  D50 amp given per protocol. BS retaken after and shows 58 in right hand and 108 in left. Venous glucose ordered.

## 2018-05-30 NOTE — Evaluation (Signed)
Clinical/Bedside Swallow Evaluation Patient Details  Name: Sheila [name] MRN: [id number] Date of Birth: May 09, 1951  Today's Date: 05/30/2018 Time: SLP Start Time (ACUTE ONLY): 0846 SLP Stop Time (ACUTE ONLY): 0900 SLP Time Calculation (min) (ACUTE ONLY): 14 min  Past Medical History:  Past Medical History:  Diagnosis Date  . Anxiety   . Aphasia   . Arthritis   . Diabetes mellitus    complicated by peripheral neuropathy  . Dysphagia following nontraumatic intracerebral hemorrhage   . Hyperlipidemia   . Muscle weakness (generalized)   . Stroke [facility]) 2015   residual right foot drop   Past Surgical History:  Past Surgical History:  Procedure Laterality Date  . CHOLECYSTECTOMY    . TONSILLECTOMY     HPI:  Pt is a 67 year old female with history of prior ICH, prior CVAs with resultant partial global aphasia, diabetes mellitus, diabetic neuropathy, PAD, hyperlipidemia, who is being brought to the hospital from her SNF due to acute encephalopathy. CT Head negative for acute findings. Recent MRI completed 05/26/18 also negative. BSE January 2019 recommended NPO due to cpgnitive-linguistic impairments but with advancement to Dys 1 diet and thin liquids prior to d/c.   Assessment / Plan / Recommendation Clinical Impression  Pt's cognitive-linguistic status impacts her ability to consume POs, with decreased alertness and poor bolus awareness for acceptance. Given stimulation and Max cues she will arouse to try small amounts of water and puree. An immediate cough followed her first sip of water but was not observed on any other consistencies. She makes no attempts to orally manipulate ice chips (just swallows whole) and cannot obtain water via straw. While it would be very effortful with likely minimal intake to try meal trays at this point, recommend trying meds crushed in puree or small cup sips of water only if fully alert and accepting. SLP will f/u for readiness to start PO diet. SLP Visit  Diagnosis: Dysphagia, unspecified (R13.10)    Aspiration Risk  Moderate aspiration risk    Diet Recommendation NPO except meds;Free water protocol after oral care   Medication Administration: Crushed with puree    Other  Recommendations Oral Care Recommendations: Oral care QID;Oral care prior to ice chip/H20 Other Recommendations: Have oral suction available   Follow up Recommendations Skilled Nursing facility      Frequency and Duration min 2x/week  2 weeks       Prognosis Prognosis for Safe Diet Advancement: Good Barriers to Reach Goals: Cognitive deficits;Language deficits      Swallow Study   General HPI: Pt is a 67 year old female with history of prior ICH, prior CVAs with resultant partial global aphasia, diabetes mellitus, diabetic neuropathy, PAD, hyperlipidemia, who is being brought to the hospital from her SNF due to acute encephalopathy. CT Head negative for acute findings. Recent MRI completed 05/26/18 also negative. BSE January 2019 recommended NPO due to cpgnitive-linguistic impairments but with advancement to Dys 1 diet and thin liquids prior to d/c. Type of Study: Bedside Swallow Evaluation Previous Swallow Assessment: see HPI Diet Prior to this Study: NPO Temperature Spikes Noted: No Respiratory Status: Room air History of Recent Intubation: No Behavior/Cognition: Lethargic/Drowsy;Doesn't follow directions Oral Cavity Assessment: (doesn't open mouth for assessment) Oral Care Completed by SLP: No Oral Cavity - Dentition: Adequate natural dentition(as can be visualized) Self-Feeding Abilities: Total assist Patient Positioning: Upright in bed Baseline Vocal Quality: Not observed Volitional Cough: Cognitively unable to elicit Volitional Swallow: Unable to elicit    Oral/Motor/Sensory Function Overall Oral Motor/Sensory Function: (  doesn't follow commands to assess)   Ice Chips Ice chips: Impaired Presentation: Spoon Oral Phase Impairments: Poor awareness of  bolus Oral Phase Functional Implications: Other (comment)(no oral manipulation - swallow whole)   Thin Liquid Thin Liquid: Impaired Presentation: Cup;Spoon;Straw Oral Phase Impairments: Poor awareness of bolus Oral Phase Functional Implications: Right anterior spillage;Left anterior spillage Pharyngeal  Phase Impairments: Cough - Immediate    Nectar Thick Nectar Thick Liquid: Not tested   Honey Thick Honey Thick Liquid: Not tested   Puree Puree: Impaired Presentation: Spoon Oral Phase Impairments: Poor awareness of bolus Oral Phase Functional Implications: Prolonged oral transit   Solid   GO   Solid: Not tested        Germain Osgood 05/30/2018,9:25 AM  Germain Osgood, M.A. CCC-SLP [phone]

## 2018-05-31 LAB — CBC
HCT: 31.6 % — ABNORMAL LOW (ref 36.0–46.0)
Hemoglobin: 11.2 g/dL — ABNORMAL LOW (ref 12.0–15.0)
MCH: 30.4 pg (ref 26.0–34.0)
MCHC: 35.4 g/dL (ref 30.0–36.0)
MCV: 85.9 fL (ref 78.0–100.0)
PLATELETS: 329 10*3/uL (ref 150–400)
RBC: 3.68 MIL/uL — AB (ref 3.87–5.11)
RDW: 12.5 % (ref 11.5–15.5)
WBC: 5 10*3/uL (ref 4.0–10.5)

## 2018-05-31 LAB — BASIC METABOLIC PANEL
ANION GAP: 7 (ref 5–15)
BUN: 6 mg/dL (ref 6–20)
CO2: 22 mmol/L (ref 22–32)
Calcium: 8.8 mg/dL — ABNORMAL LOW (ref 8.9–10.3)
Chloride: 110 mmol/L (ref 101–111)
Creatinine, Ser: 0.78 mg/dL (ref 0.44–1.00)
Glucose, Bld: 169 mg/dL — ABNORMAL HIGH (ref 65–99)
POTASSIUM: 3.8 mmol/L (ref 3.5–5.1)
SODIUM: 139 mmol/L (ref 135–145)

## 2018-05-31 LAB — GLUCOSE, CAPILLARY
GLUCOSE-CAPILLARY: 116 mg/dL — AB (ref 65–99)
GLUCOSE-CAPILLARY: 138 mg/dL — AB (ref 65–99)
GLUCOSE-CAPILLARY: 146 mg/dL — AB (ref 65–99)
Glucose-Capillary: 151 mg/dL — ABNORMAL HIGH (ref 65–99)
Glucose-Capillary: 148 mg/dL — ABNORMAL HIGH (ref 65–99)
Glucose-Capillary: 199 mg/dL — ABNORMAL HIGH (ref 65–99)
Glucose-Capillary: 121 mg/dL — ABNORMAL HIGH (ref 65–99)

## 2018-05-31 MED ORDER — DIVALPROEX SODIUM 125 MG PO CSDR
125.0000 mg | DELAYED_RELEASE_CAPSULE | Freq: Three times a day (TID) | ORAL | Status: DC
  Administered 2018-05-31 – 2018-06-01 (×4): 125 mg via ORAL
  Filled 2018-05-31 (×4): qty 1

## 2018-05-31 MED ORDER — DIVALPROEX SODIUM 125 MG PO DR TAB
125.0000 mg | DELAYED_RELEASE_TABLET | Freq: Three times a day (TID) | ORAL | Status: DC
  Filled 2018-05-31: qty 1

## 2018-05-31 NOTE — Progress Notes (Signed)
[name] is unable to accept patient back with her current level of care. CSW sent referral to SNFs for discharge tomorrow. Patient's son expressed understanding that patient may not be authorized to stay very long at SNF through [facility]   Percell Locus Caly Pellum LCSW [phone]

## 2018-05-31 NOTE — Evaluation (Signed)
Physical Therapy Evaluation Patient Details Name: Sheila [name] MRN: [id number] DOB: April 04, 1951 Today's Date: 05/31/2018   History of Present Illness  Pt is a 67 year old female with history of prior ICH, prior CVAs with resultant partial global aphasia, diabetes mellitus, diabetic neuropathy, PAD, hyperlipidemia, who is being brought to the hospital from her SNF due to acute encephalopathy. CT Head negative for acute findings. Recent MRI completed 05/26/18 also negative.  Clinical Impression  Pt admitted with above. Pt was w/c bound at SNF PTA. Pt functioning near baseline, dependent for all ADLs and mobility. Pt did sit EOB x1 min with supervision. Acute PT to follow to prevent further debilitation.    Follow Up Recommendations SNF;Supervision/Assistance - 24 hour    Equipment Recommendations  None recommended by PT    Recommendations for Other Services       Precautions / Restrictions Precautions Precautions: Fall Precaution Comments: global aphasia Restrictions Weight Bearing Restrictions: No      Mobility  Bed Mobility Overal bed mobility: Needs Assistance Bed Mobility: Supine to Sit;Sit to Supine     Supine to sit: Total assist Sit to supine: Total assist   General bed mobility comments: pt with no initiation but did use L UE once sitting to help hold herself up  Transfers                 General transfer comment: didn't attempt today, suspect pt would be a total assist and lift equip would be beneficial  Ambulation/Gait             General Gait Details: pt non ambulatory  Stairs            Wheelchair Mobility    Modified Rankin (Stroke Patients Only) Modified Rankin (Stroke Patients Only) Pre-Morbid Rankin Score: Severe disability Modified Rankin: Severe disability     Balance Overall balance assessment: Needs assistance Sitting-balance support: Feet supported;Single extremity supported Sitting balance-Leahy Scale: Fair Sitting  balance - Comments: pt able to maintain balance with supervision x 1 min      Standing balance-Leahy Scale: Zero                               Pertinent Vitals/Pain Pain Assessment: Faces Faces Pain Scale: Hurts little more Pain Location: grimacing with transfer to EOB Pain Descriptors / Indicators: Grimacing Pain Intervention(s): Monitored during session    Home Living Family/patient expects to be discharged to:: Skilled nursing facility                 Additional Comments: per previous admissions patient lives at [place], [place]. P    Prior Function Level of Independence: Needs assistance   Gait / Transfers Assistance Needed: per RN pt was bed/wheelchair bound  ADL's / Homemaking Assistance Needed: dependent        Hand Dominance        Extremity/Trunk Assessment   Upper Extremity Assessment Upper Extremity Assessment: RUE deficits/detail;LUE deficits/detail RUE Deficits / Details: no active initiation, hand in grip position at rest, able to achieve full elbow PROM, passive shoulder flexion to 90 LUE Deficits / Details: pt with active ROM and functional use, L LE with increased tone and rigidity, no active initiation    Lower Extremity Assessment Lower Extremity Assessment: RLE deficits/detail;LLE deficits/detail RLE Deficits / Details: rigid with minimal passive ROM, ankle in PF LLE Deficits / Details: rigid with minimal tolerance of passive ROM, no initial movement  Cervical / Trunk Assessment Cervical / Trunk Assessment: Normal  Communication   Communication: Receptive difficulties;Expressive difficulties(global aphasia)  Cognition Arousal/Alertness: Awake/alert Behavior During Therapy: Flat affect Overall Cognitive Status: History of cognitive impairments - at baseline                                 General Comments: pt turned head to name, was non-verbal, did shake head yes when asked if she was comfortable but did not  respond to any other question.       General Comments General comments (skin integrity, edema, etc.): pt with skin break down on bilat heels/feet covered by dressings    Exercises     Assessment/Plan    PT Assessment Patient needs continued PT services  PT Problem List Decreased strength;Decreased range of motion;Decreased activity tolerance;Decreased balance;Decreased mobility;Decreased coordination;Decreased cognition;Decreased knowledge of use of DME;Decreased safety awareness;Decreased knowledge of precautions       PT Treatment Interventions DME instruction;Gait training;Stair training;Functional mobility training;Therapeutic activities;Therapeutic exercise;Balance training;Neuromuscular re-education;Cognitive remediation;Patient/family education    PT Goals (Current goals can be found in the Care Plan section)  Acute Rehab PT Goals Patient Stated Goal: didn't state PT Goal Formulation: Patient unable to participate in goal setting Time For Goal Achievement: 06/14/18 Potential to Achieve Goals: Good    Frequency Min 2X/week   Barriers to discharge        Co-evaluation               AM-PAC PT "6 Clicks" Daily Activity  Outcome Measure Difficulty turning over in bed (including adjusting bedclothes, sheets and blankets)?: Unable Difficulty moving from lying on back to sitting on the side of the bed? : Unable Difficulty sitting down on and standing up from a chair with arms (e.g., wheelchair, bedside commode, etc,.)?: Unable Help needed moving to and from a bed to chair (including a wheelchair)?: Total Help needed walking in hospital room?: Total Help needed climbing 3-5 steps with a railing? : Total 6 Click Score: 6    End of Session   Activity Tolerance: Patient tolerated treatment well Patient left: in bed;with call bell/phone within reach;with bed alarm set Nurse Communication: Mobility status PT Visit Diagnosis: Hemiplegia and hemiparesis;Difficulty in  walking, not elsewhere classified (R26.2) Hemiplegia - Right/Left: Right Hemiplegia - dominant/non-dominant: Non-dominant Hemiplegia - caused by: Cerebral infarction    Time: 6378-5885 PT Time Calculation (min) (ACUTE ONLY): 15 min   Charges:   PT Evaluation $PT Eval Moderate Complexity: 1 Mod     PT G CodesKittie Plater, PT, DPT Pager #: [phone] Office #: [phone]   Donnae Michels M Zamiyah Resendes 05/31/2018, 1:09 PM

## 2018-05-31 NOTE — NC FL2 (Signed)
[place] LEVEL OF CARE SCREENING TOOL     IDENTIFICATION  Patient Name: Sheila [name] Birthdate: April 24, 1951 Sex: female Admission Date (Current Location): 05/29/2018  [facility] and [place] Number:  Herbalist and Address:  [facility], [place] [address] [place], [place] [address]      Provider Number: [id number]  Attending Physician Name and Address:  Jonetta Osgood, MD  Relative Name and Phone Number:  Elta Guadeloupe, son, [phone]    Current Level of Care: Hospital Recommended Level of Care: [place] Prior Approval Number:    Date Approved/Denied:   PASRR Number: [id number]  Discharge Plan: SNF    Current Diagnoses: Patient Active Problem List   Diagnosis Date Noted  . Altered mental status 05/29/2018  . Lower extremity pain, right 05/29/2018  . Aphasia following ICH Jan 2019 05/29/2018  . Dysphagia following nontraumatic intracerebral hemorrhage 05/29/2018  . Diabetes mellitus type 2, insulin dependent ([place]) 05/29/2018  . Anxiety 05/29/2018  . Encephalopathy acute 05/26/2018  . Intraventricular hemorrhage ([place]) 01/05/2018  . Hypertensive crisis   . Lethargic 05/05/2017  . Low blood sugar 05/05/2017  . Nail abnormalities 12/31/2016  . Memory changes 09/16/2016  . Multiple falls 09/16/2016  . UTI (urinary tract infection) 02/28/2016  . Depression 02/28/2016  . Acute ischemic VBA thalamic stroke ([place])   . Stroke (cerebrum) ([place])   . Anomia 02/17/2016  . Stroke ([place]) 02/17/2016  . Peripheral arterial disease ([place]) 12/03/2015  . Neuropathy 09/05/2015  . Medicare annual wellness visit, subsequent 03/12/2015  . Routine general medical examination at a health care facility 10/08/2014  . Cerumen impaction 10/08/2014  . Low back pain potentially associated with radiculopathy 03/01/2013  . Hyperlipidemia 02/15/2012  . Type 2 diabetes mellitus with neurologic complication ([place]) 14/48/1856     Orientation RESPIRATION BLADDER Height & Weight     (Disoriented )  Normal Incontinent Weight: 73 kg (161 lb) Height:  5\' 6"  (167.6 cm)  BEHAVIORAL SYMPTOMS/MOOD NEUROLOGICAL BOWEL NUTRITION STATUS      Continent (Please see DC Summary)  AMBULATORY STATUS COMMUNICATION OF NEEDS Skin   Extensive Assist Verbally                         Personal Care Assistance Level of Assistance  Bathing, Feeding, Dressing Bathing Assistance: Maximum assistance Feeding assistance: Limited assistance Dressing Assistance: Limited assistance     Functional Limitations Info  Sight, Hearing, Speech Sight Info: Adequate Hearing Info: Adequate Speech Info: Adequate    SPECIAL CARE FACTORS FREQUENCY  PT (By licensed PT), OT (By licensed OT), Speech therapy     PT Frequency: 5x/week OT Frequency: 3x/week     Speech Therapy Frequency: 3x/week      Contractures Contractures Info: Not present    Additional Factors Info  Code Status, Allergies Code Status Info: Full Allergies Info: Sulfa Antibiotics, Paxil Paroxetine Hcl, Iopamidol           Current Medications (05/31/2018):  This is the current hospital active medication list Current Facility-Administered Medications  Medication Dose Route Frequency Provider Last Rate Last Dose  . acetaminophen (TYLENOL) tablet 650 mg  650 mg Oral Q6H PRN Domenic Polite, MD      . dextrose 5 %-0.45 % sodium chloride infusion   Intravenous Continuous Domenic Polite, MD 75 mL/hr at 05/31/18 1300    . enoxaparin (LOVENOX) injection 40 mg  40 mg Subcutaneous Q24H Samella Parr, NP  40 mg at 05/30/18 1610  . gabapentin (NEURONTIN) capsule 100 mg  100 mg Oral QHS Domenic Polite, MD   Stopped at 05/30/18 2057  . insulin aspart (novoLOG) injection 0-15 Units  0-15 Units Subcutaneous Q4H Samella Parr, NP   2 Units at 05/31/18 1259  . LORazepam (ATIVAN) tablet 0.5 mg  0.5 mg Oral QHS Domenic Polite, MD      . ondansetron [facility]) tablet 4 mg  4 mg  Oral Q6H PRN Samella Parr, NP       Or  . ondansetron [facility]) injection 4 mg  4 mg Intravenous Q6H PRN Samella Parr, NP      . sodium chloride flush (NS) 0.9 % injection 3 mL  3 mL Intravenous Q12H Samella Parr, NP   3 mL at 05/30/18 2057     Discharge Medications: Please see discharge summary for a list of discharge medications.  Relevant Imaging Results:  Relevant Lab Results:   Additional Information SS#: [id number]  Benard Halsted, LCSWA

## 2018-05-31 NOTE — Progress Notes (Signed)
  Speech Language Pathology Treatment: Dysphagia  Patient Details Name: Sheila [name] MRN: [id number] DOB: 06/15/1951 Today's Date: 05/31/2018 Time: 8381-8403 SLP Time Calculation (min) (ACUTE ONLY): 14 min  Assessment / Plan / Recommendation Clinical Impression  Pt still does not verbalize or vocalize today, but she is much more alert. SLP provided hand-over-hand faded to Min A to initiate self-feeding with cup. Although vocal quality cannot be assessed, she has no other overt signs of aspiration. Her oral phase appears mildly prolonged with purees, but her swallow otherwise appears to occur swiftly. Given her clinical presentation and overall mentation, would start Dys 1 diet and thin liquids. Will continue to follow acutely with recommendation for additional f/u upon d/c to assess for tolerance and potential to advance.   HPI HPI: Pt is a 66 year old female with history of prior ICH, prior CVAs with resultant partial global aphasia, diabetes mellitus, diabetic neuropathy, PAD, hyperlipidemia, who is being brought to the hospital from her SNF due to acute encephalopathy. CT Head negative for acute findings. Recent MRI completed 05/26/18 also negative. BSE January 2019 recommended NPO due to cpgnitive-linguistic impairments but with advancement to Dys 1 diet and thin liquids prior to d/c.      SLP Plan  Continue with current plan of care       Recommendations  Diet recommendations: Dysphagia 1 (puree);Thin liquid Liquids provided via: Cup;Straw Medication Administration: Crushed with puree Supervision: Staff to assist with self feeding;Full supervision/cueing for compensatory strategies Compensations: Minimize environmental distractions;Slow rate;Small sips/bites Postural Changes and/or Swallow Maneuvers: Seated upright 90 degrees                Oral Care Recommendations: Oral care BID Follow up Recommendations: Skilled Nursing facility SLP Visit Diagnosis: Dysphagia, unspecified  (R13.10) Plan: Continue with current plan of care       GO                Germain Osgood 05/31/2018, 12:15 PM  Germain Osgood, M.A. CCC-SLP [phone]

## 2018-05-31 NOTE — Progress Notes (Addendum)
CSW left message for RN at [facility] regarding patient's discharge today. CSW also updated son that patient does not meet SNF requirements.   Percell Locus Sergio Hobart LCSW [phone]

## 2018-05-31 NOTE — Progress Notes (Signed)
PROGRESS NOTE        PATIENT DETAILS Name: Sheila [name] Age: 67 y.o. Sex: female Date of Birth: 03-10-1951 Admit Date: 05/29/2018 Admitting Physician Gloris Ham Karlyne Greenspan, MD NUU:[id number], Ples Specter, FNP  Brief Narrative: Patient is a 67 y.o. female with multiple prior CVAs, intraventricular hemorrhage following TPA administration January 2019, suspected multi-infarct dementia-just discharged from this facility on 6/1 after being managed for encephalopathy thought to be secondary to benzodiazepine use, brought back to the hospital on 6/3 for worsening altered mental status.  See below for further details  Subjective: Lying comfortably in bed-smiled at me when I walked in this morning.  Squeeze my fingers with both her hands when asked.  Says yes and no.  Assessment/Plan: Acute toxic metabolic encephalopathy: Likely related to benzodiazepine use-as no other etiology evident-UA/chest x-ray negative for infection.  Vitamin B12, TSH were within normal limits.  A recent MRI on 6/1 did not show any acute changes.  EEG without any seizure-like activity.  This morning-she was able to answer yes or no, and was able to squeeze my fingers on command.  She does not appear to be very far from the usual baseline.  I agree with Dr. Carlton Adam probably has multi-infarct dementia-and at times will have delirium.  Continue to minimize benzodiazepines-Ativan has been cut down to 0.5 mg daily at bedtime.  Resume Depakote.  I suspect she will probably benefit from being transferred to a skilled nursing facility.  Social work following.  Probable multi-infarct dementia: Suspect she may have developed multi-infarct dementia-and delirium-see above.  Follow-up with neurology in the outpatient setting.  Dysphagia: Likely related to numerous prior strokes/intracerebral hemorrhage-speech therapy following-commendations are to continue with dysphagia 1 diet.  Aphasia/history of multiple CVA's: Has  global aphasia/dysphagia and seems to have now developed significant cognitive decline and probable multi-infarct dementia complicated by delirium.  DM-2: CBG stable with SSI and recent A1c in January only at 5.5 may be a good idea to stop long-acting insulin given her overall clinical situation and frailty.  She is at risk of hypoglycemia.  Unless/CBGs become significantly elevated while on SSI-we will not plan on resuming Lantus on discharge.  Palliative care: Remains a full code-no family at bedside-given her home very poor overall state of health-bedbound status with dysphagia/aphasia-she remains at risk for deterioration in the future.  Will engage with family at bedside over the next few days.  May benefit from a palliative care evaluation at some point-but that could be done at [facility] as well.  DVT Prophylaxis: Prophylactic Lovenox  Code Status: Full code   Family Communication: None at bedside  Disposition Plan: Remain inpatient- SNF on discharge in the next day or so  Antimicrobial agents: Anti-infectives (From admission, onward)   None      Procedures: None  CONSULTS:  None  Time spent: 25- minutes-Greater than 50% of this time was spent in counseling, explanation of diagnosis, planning of further management, and coordination of care.  MEDICATIONS: Scheduled Meds: . enoxaparin (LOVENOX) injection  40 mg Subcutaneous Q24H  . gabapentin  100 mg Oral QHS  . insulin aspart  0-15 Units Subcutaneous Q4H  . LORazepam  0.5 mg Oral QHS  . sodium chloride flush  3 mL Intravenous Q12H   Continuous Infusions: . dextrose 5 % and 0.45% NaCl 75 mL/hr at 05/31/18 1300   PRN Meds:.acetaminophen,  ondansetron **OR** ondansetron (ZOFRAN) IV   PHYSICAL EXAM: Vital signs: Vitals:   05/30/18 2200 05/30/18 2342 05/31/18 0100 05/31/18 0500  BP:   128/79 110/73  Pulse:   100 91  Resp: 20  20 19   Temp:  98.9 F (37.2 C)  98.5 F (36.9 C)  TempSrc:  Oral  Oral  SpO2:    98%    Weight:      Height:       Filed Weights   05/29/18 1646  Weight: 73 kg (161 lb)   Body mass index is 25.99 kg/m.   General appearance: Chronically ill-appearing-answers yes or no to my questions this morning.  Not in any distress. Eyes:Pink conjunctiva HEENT: Atraumatic and Normocephalic Neck: supple Resp:Good air entry bilaterally, no added sounds  CVS: S1 S2 regular, no murmurs.  GI: Bowel sounds present, Non tender and not distended with no gaurding, rigidity or rebound.No organomegaly Extremities: B/L Lower Ext shows no edema Neurology: Opens eyes-smiles-just barely moves all 4 extremities-has generalized weakness all over. Musculoskeletal:No digital cyanosis Skin:No Rash, warm and dry Wounds:N/A  I have personally reviewed following labs and imaging studies  LABORATORY DATA: CBC: Recent Labs  Lab 05/26/18 1219 05/27/18 0813 05/29/18 0733 05/30/18 0410 05/31/18 0635  WBC 6.1 5.2 5.7 6.7 5.0  NEUTROABS 4.2  --  3.3  --   --   HGB 14.5 11.8* 12.3 12.1 11.2*  HCT 40.5 33.6* 35.0* 34.4* 31.6*  MCV 84.6 85.3 85.8 87.8 85.9  PLT 393 350 359 322 102    Basic Metabolic Panel: Recent Labs  Lab 05/26/18 2230 05/27/18 0813 05/29/18 0733 05/29/18 1932 05/30/18 0410 05/30/18 1313 05/31/18 0635  NA 132* 134* 134*  --  137  --  139  K 5.0 4.5 4.8  --  4.3  --  3.8  CL 100* 104 104  --  106  --  110  CO2 23 20* 21*  --  20*  --  22  GLUCOSE 104* 83 151*  --  129* 198* 169*  BUN 20 15 14   --  10  --  6  CREATININE 1.22* 0.91 0.90  --  0.94  --  0.78  CALCIUM 8.9 9.5 9.3  --  8.9  --  8.8*  MG  --   --   --  1.6*  --   --   --   PHOS  --   --   --  3.4  --   --   --     GFR: Estimated Creatinine Clearance: 68.9 mL/min (by C-G formula based on SCr of 0.78 mg/dL).  Liver Function Tests: Recent Labs  Lab 05/26/18 1219 05/26/18 2230 05/29/18 0733 05/30/18 0410  AST 49* 39 29 25  ALT 23 21 19 18   ALKPHOS 100 97 98 98  BILITOT 1.1 1.1 0.6 0.7  PROT 6.9  6.4* 6.1* 5.6*  ALBUMIN 3.4* 3.2* 3.1* 2.9*   No results for input(s): LIPASE, AMYLASE in the last 168 hours. Recent Labs  Lab 05/26/18 1448  AMMONIA 36*    Coagulation Profile: Recent Labs  Lab 05/29/18 0733  INR 1.08    Cardiac Enzymes: No results for input(s): CKTOTAL, CKMB, CKMBINDEX, TROPONINI in the last 168 hours.  BNP (last 3 results) No results for input(s): PROBNP in the last 8760 hours.  HbA1C: No results for input(s): HGBA1C in the last 72 hours.  CBG: Recent Labs  Lab 05/31/18 0028 05/31/18 0343 05/31/18 0459 05/31/18 0746 05/31/18 1153  GLUCAP  116* 138* 151* 146* 148*    Lipid Profile: No results for input(s): CHOL, HDL, LDLCALC, TRIG, CHOLHDL, LDLDIRECT in the last 72 hours.  Thyroid Function Tests: Recent Labs    05/30/18 1428  TSH 1.500    Anemia Panel: Recent Labs    05/30/18 1428  VITAMINB12 1,155*    Urine analysis:    Component Value Date/Time   COLORURINE YELLOW 05/29/2018 0706   APPEARANCEUR HAZY (A) 05/29/2018 0706   LABSPEC 1.011 05/29/2018 0706   PHURINE 5.0 05/29/2018 0706   GLUCOSEU NEGATIVE 05/29/2018 0706   HGBUR NEGATIVE 05/29/2018 0706   BILIRUBINUR NEGATIVE 05/29/2018 0706   BILIRUBINUR Neg 01/22/2014 1656   KETONESUR 5 (A) 05/29/2018 0706   PROTEINUR NEGATIVE 05/29/2018 0706   UROBILINOGEN 0.2 01/22/2014 1656   NITRITE NEGATIVE 05/29/2018 0706   LEUKOCYTESUR NEGATIVE 05/29/2018 0706    Sepsis Labs: Lactic Acid, Venous    Component Value Date/Time   LATICACIDVEN 1.01 05/29/2018 0740    MICROBIOLOGY: Recent Results (from the past 240 hour(s))  Urine culture     Status: Abnormal   Collection Time: 05/29/18  7:52 AM  Result Value Ref Range Status   Specimen Description URINE, RANDOM  Final   Special Requests   Final    unknown Normal Performed at [facility], [place] [address], [place], [place] [address]    Culture MULTIPLE SPECIES PRESENT, SUGGEST RECOLLECTION (A)  Final   Report Status  05/30/2018 FINAL  Final  MRSA PCR Screening     Status: None   Collection Time: 05/29/18  8:47 PM  Result Value Ref Range Status   MRSA by PCR NEGATIVE NEGATIVE Final    Comment:        The GeneXpert MRSA Assay (FDA approved for NASAL specimens only), is one component of a comprehensive MRSA colonization surveillance program. It is not intended to diagnose MRSA infection nor to guide or monitor treatment for MRSA infections. Performed at [facility], [place] [address] [place], [place] [address]     RADIOLOGY STUDIES/RESULTS: Ct Head Wo Contrast  Result Date: 05/29/2018 CLINICAL DATA:  Worsening of mental status over the last 3 days. History of previous stroke. EXAM: CT HEAD WITHOUT CONTRAST TECHNIQUE: Contiguous axial images were obtained from the base of the skull through the vertex without intravenous contrast. COMPARISON:  MRI 05/26/2018.  CT 05/26/2018. FINDINGS: Brain: Chronic generalized atrophy with advanced chronic small-vessel ischemic changes throughout the cerebral hemispheric white matter. No sign of acute infarction, mass lesion, hemorrhage, hydrocephalus or extra-axial collection. Vascular: There is atherosclerotic calcification of the major vessels at the base of the brain. Skull: Normal Sinuses/Orbits: Clear/normal Other: None IMPRESSION: No acute finding by CT. Generalized atrophy with extensive chronic small-vessel ischemic changes throughout the brain. Electronically Signed   By: Nelson Chimes M.D.   On: 05/29/2018 08:22   Ct Head Wo Contrast  Result Date: 05/26/2018 CLINICAL DATA:  Altered mental status today.  Weakness. EXAM: CT HEAD WITHOUT CONTRAST TECHNIQUE: Contiguous axial images were obtained from the base of the skull through the vertex without intravenous contrast. COMPARISON:  Head CT scan 05/07/2018.  Brain MRI 01/05/2018. FINDINGS: Brain: No evidence of acute infarction, hemorrhage, hydrocephalus, extra-axial collection or mass lesion/mass effect. Atrophy  and extensive chronic microvascular ischemic change are seen as on the prior studies. Vascular: Atherosclerosis noted. Skull: Intact.  No focal lesion. Sinuses/Orbits: Negative. Other: None. IMPRESSION: No acute abnormality. Atrophy and extensive chronic microvascular ischemic change. Atherosclerosis. Electronically Signed   By: Marcello Moores  Dalessio M.D.   On: 05/26/2018 13:18   Ct Head Wo Contrast  Result Date: 05/07/2018 CLINICAL DATA:  Unwitnessed fall from wheelchair. Diffuse pain. History of stroke, hyperlipidemia, diabetes. EXAM: CT HEAD WITHOUT CONTRAST CT CERVICAL SPINE WITHOUT CONTRAST TECHNIQUE: Multidetector CT imaging of the head and cervical spine was performed following the standard protocol without intravenous contrast. Multiplanar CT image reconstructions of the cervical spine were also generated. COMPARISON:  CT HEAD January 09, 2018 and CT cervical spine September 08, 2016. FINDINGS: CT HEAD FINDINGS BRAIN: No intraparenchymal hemorrhage, mass effect nor midline shift. Moderate to severe parenchymal brain volume loss. No hydrocephalus. Resolution of intraventricular blood products. Old LEFT basal ganglia and LEFT thalamus lacunar infarcts. Confluent supratentorial white matter hypodensities. No acute large vascular territory infarcts. No abnormal extra-axial fluid collections. Basal cisterns are patent. VASCULAR: Moderate calcific atherosclerosis of the carotid siphons. SKULL: No skull fracture. No significant scalp soft tissue swelling. SINUSES/ORBITS: The mastoid air-cells and included paranasal sinuses are well-aerated.The included ocular globes and orbital contents are non-suspicious. OTHER: Patient is edentulous. CT CERVICAL SPINE FINDINGS ALIGNMENT: Broad reversed lordosis.  Vertebral bodies in alignment. SKULL BASE AND VERTEBRAE: Cervical vertebral bodies and posterior elements are intact. Moderate to severe C5-6 disc height loss and endplate spurring compatible with degenerative disc,  stable from prior imaging. No destructive bony lesions. C1-2 articulation maintained mild arthropathy. Moderate RIGHT upper cervical facet arthropathy. SOFT TISSUES AND SPINAL CANAL: Nonacute. Mild calcific atherosclerosis carotid bifurcations. DISC LEVELS: No significant osseous canal stenosis or neural foraminal infarcts. UPPER CHEST: Lung apices are clear. OTHER: None. IMPRESSION: CT HEAD: 1. No acute intracranial process. 2. Stable moderate to severe parenchymal brain volume loss. 3. Severe chronic small vessel ischemic changes. Old LEFT basal ganglia and thalamus lacunar infarcts. CT CERVICAL SPINE: 1. No fracture or malalignment. Electronically Signed   By: Elon Alas M.D.   On: 05/07/2018 23:01   Ct Cervical Spine Wo Contrast  Result Date: 05/07/2018 CLINICAL DATA:  Unwitnessed fall from wheelchair. Diffuse pain. History of stroke, hyperlipidemia, diabetes. EXAM: CT HEAD WITHOUT CONTRAST CT CERVICAL SPINE WITHOUT CONTRAST TECHNIQUE: Multidetector CT imaging of the head and cervical spine was performed following the standard protocol without intravenous contrast. Multiplanar CT image reconstructions of the cervical spine were also generated. COMPARISON:  CT HEAD January 09, 2018 and CT cervical spine September 08, 2016. FINDINGS: CT HEAD FINDINGS BRAIN: No intraparenchymal hemorrhage, mass effect nor midline shift. Moderate to severe parenchymal brain volume loss. No hydrocephalus. Resolution of intraventricular blood products. Old LEFT basal ganglia and LEFT thalamus lacunar infarcts. Confluent supratentorial white matter hypodensities. No acute large vascular territory infarcts. No abnormal extra-axial fluid collections. Basal cisterns are patent. VASCULAR: Moderate calcific atherosclerosis of the carotid siphons. SKULL: No skull fracture. No significant scalp soft tissue swelling. SINUSES/ORBITS: The mastoid air-cells and included paranasal sinuses are well-aerated.The included ocular globes and  orbital contents are non-suspicious. OTHER: Patient is edentulous. CT CERVICAL SPINE FINDINGS ALIGNMENT: Broad reversed lordosis.  Vertebral bodies in alignment. SKULL BASE AND VERTEBRAE: Cervical vertebral bodies and posterior elements are intact. Moderate to severe C5-6 disc height loss and endplate spurring compatible with degenerative disc, stable from prior imaging. No destructive bony lesions. C1-2 articulation maintained mild arthropathy. Moderate RIGHT upper cervical facet arthropathy. SOFT TISSUES AND SPINAL CANAL: Nonacute. Mild calcific atherosclerosis carotid bifurcations. DISC LEVELS: No significant osseous canal stenosis or neural foraminal infarcts. UPPER CHEST: Lung apices are clear. OTHER: None. IMPRESSION: CT HEAD: 1. No acute intracranial process. 2. Stable moderate to  severe parenchymal brain volume loss. 3. Severe chronic small vessel ischemic changes. Old LEFT basal ganglia and thalamus lacunar infarcts. CT CERVICAL SPINE: 1. No fracture or malalignment. Electronically Signed   By: Elon Alas M.D.   On: 05/07/2018 23:01   Ct Knee Right Wo Contrast  Result Date: 05/29/2018 CLINICAL DATA:  Right hip, thigh, and knee pain. No history of trauma. EXAM: CT OF THE RIGHT HIP WITHOUT CONTRAST CT OF THE RIGHT FEMUR WITHOUT CONTRAST CT OF THE RIGHT KNEE WITHOUT CONTRAST TECHNIQUE: Multidetector CT imaging of the right hip, femur, and knee was performed according to the standard protocol. Multiplanar CT image reconstructions were also generated. COMPARISON:  Right hip x-rays dated April 12, 2018. FINDINGS: Bones/Joint/Cartilage No acute fracture or dislocation. Well corticated vertical, slightly concave linear lucency through the lateral aspect of the patella is favored to represent a type 2 bipartite patella. Lateral patellar subluxation. Mild tricompartmental knee joint space narrowing with small subchondral cystic change and marginal osteophytes. Chondrocalcinosis of the medial and lateral  menisci. Small knee joint effusion. Mild right hip joint space narrowing with subchondral sclerosis, cystic change, and small femoral head osteophytes. Osteopenia. Ligaments Suboptimally assessed by CT. Muscles and Tendons Moderate atrophy of the gluteus minimus muscle. The visualized muscles and tendons are otherwise grossly unremarkable. The extensor mechanism is intact. Soft tissues Mild soft tissue stranding in the lateral right hip. No fluid collection or hematoma. No soft tissue mass. Vascular calcifications. IMPRESSION: 1. No acute fracture. Well corticated vertical lucency through the lateral aspect of the patella is favored to represent a type 2 bipartite patella. 2. Lateral patellar subluxation with small knee joint effusion. 3. Mild hip and knee osteoarthritis. 4. Chondrocalcinosis of the menisci, which can be seen with CPPD arthropathy. Electronically Signed   By: Titus Dubin M.D.   On: 05/29/2018 19:42   Mr Brain Wo Contrast  Result Date: 05/26/2018 CLINICAL DATA:  Initial evaluation for acute weakness, history of prior CVA with left-sided deficits. EXAM: MRI HEAD WITHOUT CONTRAST TECHNIQUE: Multiplanar, multiecho pulse sequences of the brain and surrounding structures were obtained without intravenous contrast. COMPARISON:  Prior CT from earlier the same day as well as previous MRI from 01/05/2018. FINDINGS: Brain: Advanced cerebral atrophy with chronic small vessel ischemic disease, stable from previous. Remote lacunar infarcts present within the left basal ganglia, left thalamus, and left pons. Few probable tiny remote infarcts noted within the bilateral cerebellar hemispheres. No abnormal foci of restricted diffusion to suggest acute ischemic infarct. Gray-white matter differentiation maintained. No evidence for acute intracranial hemorrhage. Chronic hemosiderin staining seen along the ventricular appended my of the lateral ventricles, consistent with prior intraventricular hemorrhage.  Scattered susceptibility artifacts seen involving the bilateral temporal occipital regions also consistent with prior hemorrhage. No mass lesion or midline shift. Diffuse ventricular prominence, like related global parenchymal volume loss of hydrocephalus, stable. No extra-axial fluid collection. Major dural sinuses patent. Normal pituitary gland.  Corpus callosum is thinned and atrophic. Vascular: Major intracranial vascular flow voids maintained. Skull and upper cervical spine: Craniocervical junction normal. Upper cervical spine within normal limits. Bone marrow signal intensity within normal limits. No scalp soft tissue abnormality. Sinuses/Orbits: Globes and orbital soft tissues demonstrate no acute finding. Paranasal sinuses clear. Small left mastoid effusion noted, of doubtful significance. Inner ear structures normal. Other: None. IMPRESSION: 1. No acute intracranial abnormality. 2. Advanced cerebral atrophy with chronic small vessel ischemic disease with remote lacunar infarcts involving the left basal ganglia, left thalamus, and left paramedian pons. Electronically Signed  By: Jeannine Boga M.D.   On: 05/26/2018 21:50   Ct Hip Right Wo Contrast  Result Date: 05/29/2018 CLINICAL DATA:  Right hip, thigh, and knee pain. No history of trauma. EXAM: CT OF THE RIGHT HIP WITHOUT CONTRAST CT OF THE RIGHT FEMUR WITHOUT CONTRAST CT OF THE RIGHT KNEE WITHOUT CONTRAST TECHNIQUE: Multidetector CT imaging of the right hip, femur, and knee was performed according to the standard protocol. Multiplanar CT image reconstructions were also generated. COMPARISON:  Right hip x-rays dated April 12, 2018. FINDINGS: Bones/Joint/Cartilage No acute fracture or dislocation. Well corticated vertical, slightly concave linear lucency through the lateral aspect of the patella is favored to represent a type 2 bipartite patella. Lateral patellar subluxation. Mild tricompartmental knee joint space narrowing with small subchondral  cystic change and marginal osteophytes. Chondrocalcinosis of the medial and lateral menisci. Small knee joint effusion. Mild right hip joint space narrowing with subchondral sclerosis, cystic change, and small femoral head osteophytes. Osteopenia. Ligaments Suboptimally assessed by CT. Muscles and Tendons Moderate atrophy of the gluteus minimus muscle. The visualized muscles and tendons are otherwise grossly unremarkable. The extensor mechanism is intact. Soft tissues Mild soft tissue stranding in the lateral right hip. No fluid collection or hematoma. No soft tissue mass. Vascular calcifications. IMPRESSION: 1. No acute fracture. Well corticated vertical lucency through the lateral aspect of the patella is favored to represent a type 2 bipartite patella. 2. Lateral patellar subluxation with small knee joint effusion. 3. Mild hip and knee osteoarthritis. 4. Chondrocalcinosis of the menisci, which can be seen with CPPD arthropathy. Electronically Signed   By: Titus Dubin M.D.   On: 05/29/2018 19:42   Ct Femur Right Wo Contrast  Result Date: 05/29/2018 CLINICAL DATA:  Right hip, thigh, and knee pain. No history of trauma. EXAM: CT OF THE RIGHT HIP WITHOUT CONTRAST CT OF THE RIGHT FEMUR WITHOUT CONTRAST CT OF THE RIGHT KNEE WITHOUT CONTRAST TECHNIQUE: Multidetector CT imaging of the right hip, femur, and knee was performed according to the standard protocol. Multiplanar CT image reconstructions were also generated. COMPARISON:  Right hip x-rays dated April 12, 2018. FINDINGS: Bones/Joint/Cartilage No acute fracture or dislocation. Well corticated vertical, slightly concave linear lucency through the lateral aspect of the patella is favored to represent a type 2 bipartite patella. Lateral patellar subluxation. Mild tricompartmental knee joint space narrowing with small subchondral cystic change and marginal osteophytes. Chondrocalcinosis of the medial and lateral menisci. Small knee joint effusion. Mild right hip  joint space narrowing with subchondral sclerosis, cystic change, and small femoral head osteophytes. Osteopenia. Ligaments Suboptimally assessed by CT. Muscles and Tendons Moderate atrophy of the gluteus minimus muscle. The visualized muscles and tendons are otherwise grossly unremarkable. The extensor mechanism is intact. Soft tissues Mild soft tissue stranding in the lateral right hip. No fluid collection or hematoma. No soft tissue mass. Vascular calcifications. IMPRESSION: 1. No acute fracture. Well corticated vertical lucency through the lateral aspect of the patella is favored to represent a type 2 bipartite patella. 2. Lateral patellar subluxation with small knee joint effusion. 3. Mild hip and knee osteoarthritis. 4. Chondrocalcinosis of the menisci, which can be seen with CPPD arthropathy. Electronically Signed   By: Titus Dubin M.D.   On: 05/29/2018 19:42   Dg Chest Port 1 View  Result Date: 05/29/2018 CLINICAL DATA:  Altered mental status, history stroke, diabetes mellitus EXAM: PORTABLE CHEST 1 VIEW COMPARISON:  Portable exam 0712 hours compared to 05/26/2018 FINDINGS: Rotated to the LEFT. Normal heart size, mediastinal  contours, and pulmonary vascularity. Decreased lung volumes with bibasilar atelectasis. Remaining lungs clear. No pleural effusion or pneumothorax. Bones demineralized. IMPRESSION: Bibasilar atelectasis. Electronically Signed   By: Lavonia Dana M.D.   On: 05/29/2018 07:23   Dg Chest Port 1 View  Result Date: 05/26/2018 CLINICAL DATA:  Altered mental status EXAM: PORTABLE CHEST 1 VIEW COMPARISON:  January 09, 2018 FINDINGS: There is slight elevation of the left hemidiaphragm, stable. There is no edema or consolidation. Heart size and pulmonary vascularity are normal. No adenopathy. No bone lesions. IMPRESSION: No edema or consolidation.  Stable cardiac silhouette. Electronically Signed   By: Lowella Grip III M.D.   On: 05/26/2018 13:32     LOS: 2 days   Oren Binet,  MD  [facility]  If 7PM-7AM, please contact night-coverage  Please page via [email or web address] [email or web address]-click on MD name and type text message  05/31/2018, 2:04 PM

## 2018-05-31 NOTE — Clinical Social Work Note (Signed)
Clinical Social Work Assessment  Patient Details  Name: Sheila [name] MRN: [id number] Date of Birth: 10/12/1951  Date of referral:  05/31/18               Reason for consult:  Facility Placement                Permission sought to share information with:  Facility Sport and exercise psychologist, Family Supports Permission granted to share information::  No  Name::     Engineer, structural::  Durenda Age  Relationship::  Son  Contact Information:  [phone]  Housing/Transportation Living arrangements for the past 2 months:  [place] of Information:  Adult Children Patient Interpreter Needed:  None Criminal Activity/Legal Involvement Pertinent to Current Situation/Hospitalization:  No - Comment as needed Significant Relationships:  Adult Children Lives with:  Facility Resident Do you feel safe going back to the place where you live?  Yes Need for family participation in patient care:  Yes (Comment)  Care giving concerns:  CSW received consult regarding discharge planning. CSW spoke with patient's son. He reported that patient resides at [facility] and wants her to return there at discharge. CSW to continue to follow and assist with discharge planning needs.   Social Worker assessment / plan:  CSW spoke with patient's son concerning return to [name].    Employment status:  Retired Forensic scientist:  Medicare PT Recommendations:  Not assessed at this time Information / Referral to community resources:  [place]  Patient/Family's Response to care:  Patient's son reports agreement with discharge plan and understands that CSW will be communicating with facility for return. CSW explained that patient does not meet SNF requirements through Medicare since she does not have a 3 night inpatient stay and is physically at baseline. He states that he likes the [name]. Patient will require PTAR for transport.  Patient/Family's Understanding of and  Emotional Response to Diagnosis, Current Treatment, and Prognosis:  Patient/family is realistic regarding therapy needs and expressed being hopeful for return to [name] placement. Patient's son expressed understanding of CSW role and discharge process as well as medical condition. No questions/concerns about plan or treatment.    Emotional Assessment Appearance:  Appears stated age Attitude/Demeanor/Rapport:  Unable to Assess Affect (typically observed):  Unable to Assess Orientation:  (Disoriented x4) Alcohol / Substance use:  Not Applicable Psych involvement (Current and /or in the community):  No (Comment)  Discharge Needs  Concerns to be addressed:  Care Coordination Readmission within the last 30 days:  Yes Current discharge risk:  None Barriers to Discharge:  No Barriers Identified   Benard Halsted, [place] 05/31/2018, 11:35 AM

## 2018-05-31 NOTE — Evaluation (Signed)
Occupational Therapy Evaluation Patient Details Name: Sheila [name] MRN: [id number] DOB: 1951/04/08 Today's Date: 05/31/2018    History of Present Illness Pt is a 67 year old female with history of prior ICH, prior CVAs with resultant partial global aphasia, diabetes mellitus, diabetic neuropathy, PAD, hyperlipidemia, who is being brought to the hospital from her SNF due to acute encephalopathy. CT Head negative for acute findings. Recent MRI completed 05/26/18 also negative.   Clinical Impression   This 67 y/o female presents with the above. Per chart review pt in from [facility] at [facility]. Contacted RN at [employer] who reports PTA pt was wheelchair bound for mobility, transferring to/from wheelchair with assist; reports pt was receiving assist for all ADLs, though was able to participate in assisting with bathing and was completing self-feeding. In speaking with RN who spoke with son, son reporting pt has been at her current level of function for some time. Pt minimally responsive to simple one step commands this session, intermittently making eye contact with therapist. Pt able to complete simple grooming ADLs at bed level with max hand over hand assist to initiate task; currently requiring totalA for all other ADLs. Pt also demonstrating deficits in both R/L UEs, with pt only able to use LUE during functional tasks. Recommend pt receive SNF level therapies at time of discharge. Will continue to follow acutely to progress pt towards established OT goals.     Follow Up Recommendations  SNF;Supervision/Assistance - 24 hour    Equipment Recommendations  Other (comment)(TBD in next venue )           Precautions / Restrictions Precautions Precautions: Fall Precaution Comments: global aphasia Restrictions Weight Bearing Restrictions: No                                                    ADL either performed or assessed with clinical judgement   ADL Overall ADL's  : Needs assistance/impaired     Grooming: Maximal assistance;Bed level;Wash/dry face Grooming Details (indicate cue type and reason): max hand over hand assist initially to wash face, when given simple commands and assisting with initiating task pt does continue task completion on her own                                General ADL Comments: pt minimally responsive to commands; requires MaxA for simple grooming ADLs, currently totalA for all other ADLs      Vision   Additional Comments: pt does track and intermittently make eye contact with therapist                 Pertinent Vitals/Pain Pain Assessment: Faces Faces Pain Scale: Hurts a little bit Pain Location: generalized  Pain Descriptors / Indicators: Grimacing Pain Intervention(s): Monitored during session          Extremity/Trunk Assessment Upper Extremity Assessment Upper Extremity Assessment: LUE deficits/detail;RUE deficits/detail RUE Deficits / Details: no active initiation, hand in grip position at rest, able to achieve full elbow PROM, limited passive shoulder flexion, difficult to determine if pt resisting movements or due to increased tone; RUE significantly more edemous than LUE  LUE Deficits / Details: pt with active ROM and functional use, L LE with increased tone and rigidity, no active initiation, noted tendency to keep L elbow  in flexed position    Lower Extremity Assessment Lower Extremity Assessment: Defer to PT evaluation       Communication Communication Communication: Receptive difficulties;Expressive difficulties(global aphasia )   Cognition Arousal/Alertness: Awake/alert Behavior During Therapy: Flat affect Overall Cognitive Status: History of cognitive impairments - at baseline                                 General Comments: pt making eye contact with therapist intermittently during session; intermittently follows one step commands, but does so inconsistently    General Comments                  Home Living Family/patient expects to be discharged to:: Skilled nursing facility                                 Additional Comments: per chart review pt from [facility] at [facility]      Prior Functioning/Environment Level of Independence: Needs assistance  Gait / Transfers Assistance Needed: per RN pt was bed/wheelchair bound; when speaking with RN at [facility] she reports pt was able to transfer to/from w/c via stand pivot - in speaking with RN here at [facility] (who spoke with son), son reporting pt has been at this current level for some time  ADL's / Homemaking Assistance Needed: spoke with RN at [facility], reports pt was able to assist some with bathing when provided washcloth, was self-feeding            OT Problem List: Decreased strength;Decreased range of motion;Impaired balance (sitting and/or standing);Decreased cognition;Decreased coordination;Impaired UE functional use      OT Treatment/Interventions: Self-care/ADL training;DME and/or AE instruction;Therapeutic activities;Balance training;Therapeutic exercise;Patient/family education;Manual therapy;Neuromuscular education    OT Goals(Current goals can be found in the care plan section) Acute Rehab OT Goals Patient Stated Goal: didn't state OT Goal Formulation: Patient unable to participate in goal setting Time For Goal Achievement: 06/14/18 Potential to Achieve Goals: Fair  OT Frequency: Min 2X/week   Barriers to D/C:                          AM-PAC PT "6 Clicks" Daily Activity     Outcome Measure Help from another person eating meals?: A Lot Help from another person taking care of personal grooming?: A Lot Help from another person toileting, which includes using toliet, bedpan, or urinal?: Total Help from another person bathing (including washing, rinsing, drying)?: Total Help from another person to put on and taking off regular upper body clothing?:  Total Help from another person to put on and taking off regular lower body clothing?: Total 6 Click Score: 8   End of Session Nurse Communication: Mobility status  Activity Tolerance: Patient tolerated treatment well Patient left: in bed;with call bell/phone within reach;with bed alarm set  OT Visit Diagnosis: Muscle weakness (generalized) (M62.81);Other symptoms and signs involving the nervous system (R29.898);Cognitive communication deficit (R41.841)                Time: 6659-9357 OT Time Calculation (min): 16 min Charges:  OT General Charges $OT Visit: 1 Visit OT Evaluation $OT Eval Moderate Complexity: 1 Mod G-Codes:     Lou Cal, OT Pager [phone] 05/31/2018   Raymondo Band 05/31/2018, 5:04 PM

## 2018-06-01 DIAGNOSIS — Z7401 Bed confinement status: Secondary | ICD-10-CM | POA: Diagnosis not present

## 2018-06-01 DIAGNOSIS — A419 Sepsis, unspecified organism: Secondary | ICD-10-CM | POA: Diagnosis not present

## 2018-06-01 DIAGNOSIS — I69320 Aphasia following cerebral infarction: Secondary | ICD-10-CM | POA: Diagnosis not present

## 2018-06-01 DIAGNOSIS — F0391 Unspecified dementia with behavioral disturbance: Secondary | ICD-10-CM | POA: Diagnosis not present

## 2018-06-01 DIAGNOSIS — I639 Cerebral infarction, unspecified: Secondary | ICD-10-CM | POA: Diagnosis not present

## 2018-06-01 DIAGNOSIS — N183 Chronic kidney disease, stage 3 (moderate): Secondary | ICD-10-CM | POA: Diagnosis not present

## 2018-06-01 DIAGNOSIS — C189 Malignant neoplasm of colon, unspecified: Secondary | ICD-10-CM | POA: Diagnosis not present

## 2018-06-01 DIAGNOSIS — R51 Headache: Secondary | ICD-10-CM | POA: Diagnosis not present

## 2018-06-01 DIAGNOSIS — F0390 Unspecified dementia without behavioral disturbance: Secondary | ICD-10-CM | POA: Diagnosis not present

## 2018-06-01 DIAGNOSIS — I69191 Dysphagia following nontraumatic intracerebral hemorrhage: Secondary | ICD-10-CM | POA: Diagnosis not present

## 2018-06-01 DIAGNOSIS — R41841 Cognitive communication deficit: Secondary | ICD-10-CM | POA: Diagnosis not present

## 2018-06-01 DIAGNOSIS — E119 Type 2 diabetes mellitus without complications: Secondary | ICD-10-CM | POA: Diagnosis not present

## 2018-06-01 DIAGNOSIS — M6281 Muscle weakness (generalized): Secondary | ICD-10-CM | POA: Diagnosis not present

## 2018-06-01 DIAGNOSIS — R1310 Dysphagia, unspecified: Secondary | ICD-10-CM | POA: Diagnosis not present

## 2018-06-01 DIAGNOSIS — D649 Anemia, unspecified: Secondary | ICD-10-CM | POA: Diagnosis not present

## 2018-06-01 DIAGNOSIS — N390 Urinary tract infection, site not specified: Secondary | ICD-10-CM | POA: Diagnosis not present

## 2018-06-01 DIAGNOSIS — I509 Heart failure, unspecified: Secondary | ICD-10-CM | POA: Diagnosis not present

## 2018-06-01 DIAGNOSIS — F0150 Vascular dementia without behavioral disturbance: Secondary | ICD-10-CM | POA: Diagnosis not present

## 2018-06-01 DIAGNOSIS — R278 Other lack of coordination: Secondary | ICD-10-CM | POA: Diagnosis not present

## 2018-06-01 DIAGNOSIS — R4701 Aphasia: Secondary | ICD-10-CM | POA: Diagnosis not present

## 2018-06-01 DIAGNOSIS — R633 Feeding difficulties: Secondary | ICD-10-CM | POA: Diagnosis not present

## 2018-06-01 DIAGNOSIS — F419 Anxiety disorder, unspecified: Secondary | ICD-10-CM | POA: Diagnosis not present

## 2018-06-01 DIAGNOSIS — R4182 Altered mental status, unspecified: Secondary | ICD-10-CM | POA: Diagnosis not present

## 2018-06-01 DIAGNOSIS — G9340 Encephalopathy, unspecified: Secondary | ICD-10-CM | POA: Diagnosis not present

## 2018-06-01 DIAGNOSIS — G9341 Metabolic encephalopathy: Secondary | ICD-10-CM | POA: Diagnosis not present

## 2018-06-01 DIAGNOSIS — I10 Essential (primary) hypertension: Secondary | ICD-10-CM | POA: Diagnosis not present

## 2018-06-01 DIAGNOSIS — Z794 Long term (current) use of insulin: Secondary | ICD-10-CM | POA: Diagnosis not present

## 2018-06-01 DIAGNOSIS — R569 Unspecified convulsions: Secondary | ICD-10-CM | POA: Diagnosis not present

## 2018-06-01 DIAGNOSIS — I4891 Unspecified atrial fibrillation: Secondary | ICD-10-CM | POA: Diagnosis not present

## 2018-06-01 DIAGNOSIS — E1149 Type 2 diabetes mellitus with other diabetic neurological complication: Secondary | ICD-10-CM | POA: Diagnosis not present

## 2018-06-01 DIAGNOSIS — N186 End stage renal disease: Secondary | ICD-10-CM | POA: Diagnosis not present

## 2018-06-01 DIAGNOSIS — I69151 Hemiplegia and hemiparesis following nontraumatic intracerebral hemorrhage affecting right dominant side: Secondary | ICD-10-CM | POA: Diagnosis not present

## 2018-06-01 DIAGNOSIS — M2550 Pain in unspecified joint: Secondary | ICD-10-CM | POA: Diagnosis not present

## 2018-06-01 LAB — GLUCOSE, CAPILLARY
Glucose-Capillary: 105 mg/dL — ABNORMAL HIGH (ref 65–99)
Glucose-Capillary: 136 mg/dL — ABNORMAL HIGH (ref 65–99)
Glucose-Capillary: 139 mg/dL — ABNORMAL HIGH (ref 65–99)
Glucose-Capillary: 155 mg/dL — ABNORMAL HIGH (ref 65–99)
Glucose-Capillary: 159 mg/dL — ABNORMAL HIGH (ref 65–99)

## 2018-06-01 MED ORDER — INSULIN ASPART 100 UNIT/ML ~~LOC~~ SOLN
0.0000 [IU] | Freq: Three times a day (TID) | SUBCUTANEOUS | Status: DC
  Administered 2018-06-01: 2 [IU] via SUBCUTANEOUS

## 2018-06-01 MED ORDER — LORAZEPAM 0.5 MG PO TABS: 0.5000 mg | ORAL_TABLET | Freq: Every day | ORAL | 0 refills | Status: AC

## 2018-06-01 MED ORDER — DIVALPROEX SODIUM 125 MG PO CSDR: 125.0000 mg | DELAYED_RELEASE_CAPSULE | Freq: Three times a day (TID) | ORAL | Status: AC

## 2018-06-01 MED ORDER — INSULIN ASPART 100 UNIT/ML ~~LOC~~ SOLN: SUBCUTANEOUS | 11 refills | Status: AC

## 2018-06-01 MED ORDER — GABAPENTIN 100 MG PO CAPS: 100.0000 mg | ORAL_CAPSULE | Freq: Every day | ORAL | Status: AC

## 2018-06-01 NOTE — Progress Notes (Signed)
Clay Illene Regulus to be D/C'd Home per MD order.  Discussed with the patient and all questions fully answered.  VSS, Skin clean, dry and intact without evidence of skin break down, no evidence of skin tears noted. IV catheter discontinued intact. Site without signs and symptoms of complications. Dressing and pressure applied.  An After Visit Summary was printed and given to the patient. Patient received prescription.  D/c education completed with patient/family including follow up instructions, medication list, d/c activities limitations if indicated, with other d/c instructions as indicated by MD - patient able to verbalize understanding, all questions fully answered.   Patient instructed to return to ED, call 911, or call MD for any changes in condition.   Patient escorted via [place], and D/C home via private auto.  Luci Bank 06/01/2018 6:17 PM

## 2018-06-01 NOTE — Clinical Social Work Placement (Signed)
   CLINICAL SOCIAL WORK PLACEMENT  NOTE  Date:  06/01/2018  Patient Details  Name: Sheila [name] MRN: [id number] Date of Birth: Dec 24, 1951  Clinical Social Work is seeking post-discharge placement for this patient at the [facility] level of care (*CSW will initial, date and re-position this form in  chart as items are completed):  Yes   Patient/family provided with [facility] Work Department's list of facilities offering this level of care within the geographic area requested by the patient (or if unable, by the patient's family).  Yes   Patient/family informed of their freedom to choose among providers that offer the needed level of care, that participate in Medicare, Medicaid or managed care program needed by the patient, have an available bed and are willing to accept the patient.  Yes   Patient/family informed of [facility]'s ownership interest in [facility] and [facility], as well as of the fact that they are under no obligation to receive care at these facilities.  PASRR submitted to EDS on 05/31/18     PASRR number received on 05/31/18     Existing PASRR number confirmed on       FL2 transmitted to all facilities in geographic area requested by pt/family on 05/31/18     FL2 transmitted to all facilities within larger geographic area on       Patient informed that his/her managed care company has contracts with or will negotiate with certain facilities, including the following:        Yes   Patient/family informed of bed offers received.  Patient [facility] bed at [facility]     Physician recommends and patient chooses bed at      Patient to be transferred to [facility] on 06/01/18.  Patient to be transferred to facility by ptar     Patient family notified on 06/01/18 of transfer.  Name of family member notified:  Mark     PHYSICIAN Please sign FL2     Additional Comment:     _______________________________________________ Jorge Ny, LCSW 06/01/2018, 12:43 PM

## 2018-06-01 NOTE — Progress Notes (Signed)
Patient's son has selected [facility].   Percell Locus Latronda Spink LCSW [phone]

## 2018-06-01 NOTE — Progress Notes (Signed)
Patient will DC to: [facility] Anticipated DC date: 06/01/18 Family notified: Son, Elta Gu[place]oupe Transport by: Corey Harold 4:15pm   Per MD patient ready for DC to [facility], patient, patient's family, and facility notified of DC. Discharge Summary sent to facility. RN given number for report [phone]). DC packet on chart. Ambulance transport requested for patient.   CSW signing off.  Cedric Fishman, LCSW Clinical Social Worker [phone]

## 2018-06-01 NOTE — Care Management Important Message (Signed)
Important Message  Patient Details  Name: Sheila [name] MRN: [id number] Date of Birth: 23-Jun-1951   Medicare Important Message Given:  Yes    Orbie Pyo 06/01/2018, 2:28 PM

## 2018-06-01 NOTE — Discharge Summary (Signed)
PATIENT DETAILS Name: Sheila [name] Age: 67 y.o. Sex: female Date of Birth: 12/09/1951 MRN: [id number]. Admitting Physician: Caren Griffins, MD WUJ:[id number], Ples Specter, FNP  Admit Date: 05/29/2018 Discharge date: 06/01/2018  Recommendations for Outpatient Follow-up:  1. Follow up with PCP in 1-2 weeks 2. Please obtain BMP/CBC in one week 3. Please ensure palliative care follow-up at SNF 4. Ensure speech therapy follow-up at SNF  Admitted From:  [name]   Disposition: SNF   Home Health: No  Equipment/Devices: None  Discharge Condition: Stable  CODE STATUS: FULL CODE  Diet recommendation:  Heart Healthy / Carb Modified  Diet recommendations: Dysphagia 1 (puree);Thin liquid Liquids provided via: Cup;Straw Medication Administration: Crushed with puree Supervision: Staff to assist with self feeding;Full supervision/cueing for compensatory strategies Compensations: Minimize environmental distractions;Slow rate;Small sips/bites Postural Changes and/or Swallow Maneuvers: Seated upright 90 degrees  Brief Summary: See H&P, Labs, Consult and Test reports for all details in brief, Patient is a 67 y.o. female with multiple prior CVAs, intraventricular hemorrhage following TPA administration January 2019, suspected multi-infarct dementia-just discharged from this facility on 6/1 after being managed for encephalopathy thought to be secondary to benzodiazepine use, brought back to the hospital on 6/3 for worsening altered mental status.  See below for further details   Brief Hospital Course: Acute toxic metabolic encephalopathy: Likely related to benzodiazepine use-as no other etiology evident-UA/chest x-ray negative for infection.  Vitamin B12, TSH were within normal limits.  A recent MRI Brain on 6/1 did not show any acute changes.  EEG without any seizure-like activity.  This morning-she was able to answer yes or no, and was able to squeeze my fingers on command.  She does not  appear to be very far from the usual baseline.  I agree with prior hospitalist MD Dr. Carlton Adam probably has multi-infarct dementia-and at times will have delirium.  Continue to minimize benzodiazepines-Ativan has been cut down to 0.5 mg daily at bedtime.  Continue Depakote on discharge.    Evaluated by rehab services, she will now be discharged to SNF instead of [name].   Probable multi-infarct dementia: Suspect she may have developed multi-infarct dementia-and delirium-see above.  Follow-up with neurology in the outpatient setting.  Dysphagia: Likely related to numerous prior strokes/intracerebral hemorrhage-speech therapy following-commendations are to continue with dysphagia 1 diet.  Aphasia/history of multiple CVA's: Has global aphasia/dysphagia and seems to have now developed significant cognitive decline and probable multi-infarct dementia complicated by delirium.  DM-2: CBG stable with SSI and recent A1c in January only at 5.5 may be a good idea to stop long-acting insulin given her overall clinical situation and frailty.  She is at risk of hypoglycemia.  Unless/CBGs become significantly elevated while on SSI-we will not plan on resuming Lantus on discharge.  Palliative care: Remains a full code-no family at bedside-given her home very poor overall state of health-bedbound status with dysphagia/aphasia-she remains at risk for deterioration in the future.  Will engage with family at bedside over the next few days.  May benefit from a palliative care evaluation at some point-but that could be done at [facility] as well.  Procedures/Studies: None  Discharge Diagnoses:  Principal Problem:   Altered mental status Active Problems:   Lower extremity pain, right   Aphasia following ICH Jan 2019   Dysphagia following nontraumatic intracerebral hemorrhage   Diabetes mellitus type 2, insulin dependent ([place])   Anxiety   Discharge Instructions:  Activity:  As tolerated with Full fall precautions  use walker/cane & assistance  as needed  Discharge Instructions    Diet - low sodium heart healthy   Complete by:  As directed    Diet recommendations: Dysphagia 1 (puree);Thin liquid Liquids provided via: Cup;Straw Medication Administration: Crushed with puree Supervision: Staff to assist with self feeding;Full supervision/cueing for compensatory strategies Compensations: Minimize environmental distractions;Slow rate;Small sips/bites Postural Changes and/or Swallow Maneuvers: Seated upright 90 degrees   Diet general   Complete by:  As directed    Discharge instructions   Complete by:  As directed    Follow with Primary MD  Golden Circle, FNP in 1 week  Please get a complete blood count and chemistry panel checked by your Primary MD at your next visit, and again as instructed by your Primary MD.  Get Medicines reviewed and adjusted: Please take all your medications with you for your next visit with your Primary MD  Laboratory/radiological data: Please request your Primary MD to go over all hospital tests and procedure/radiological results at the follow up, please ask your Primary MD to get all Hospital records sent to his/her office.  In some cases, they will be blood work, cultures and biopsy results pending at the time of your discharge. Please request that your primary care M.D. follows up on these results.  Also Note the following: If you experience worsening of your admission symptoms, develop shortness of breath, life threatening emergency, suicidal or homicidal thoughts you must seek medical attention immediately by calling 911 or calling your MD immediately  if symptoms less severe.  You must read complete instructions/literature along with all the possible adverse reactions/side effects for all the Medicines you take and that have been prescribed to you. Take any new Medicines after you have completely understood and accpet all the possible adverse reactions/side effects.    Do not drive when taking Pain medications or sleeping medications (Benzodaizepines)  Do not take more than prescribed Pain, Sleep and Anxiety Medications. It is not advisable to combine anxiety,sleep and pain medications without talking with your primary care practitioner  Special Instructions: If you have smoked or chewed Tobacco  in the last 2 yrs please stop smoking, stop any regular Alcohol  and or any Recreational drug use.  Wear Seat belts while driving.  Please note: You were cared for by a hospitalist during your hospital stay. Once you are discharged, your primary care physician will handle any further medical issues. Please note that NO REFILLS for any discharge medications will be authorized once you are discharged, as it is imperative that you return to your primary care physician (or establish a relationship with a primary care physician if you do not have one) for your post hospital discharge needs so that they can reassess your need for medications and monitor your lab values.   Increase activity slowly   Complete by:  As directed      Allergies as of 06/01/2018      Reactions   Sulfa Antibiotics Other (See Comments)   As noted on MAR   Paxil [paroxetine Hcl] Other (See Comments)   Dreams/ nightmares   Iopamidol Rash      Medication List    STOP taking these medications   divalproex 125 MG DR tablet Commonly known as:  DEPAKOTE Replaced by:  divalproex 125 MG capsule   Dulaglutide 0.75 MG/0.5ML Sopn Commonly known as:  TRULICITY   Insulin Glargine 100 UNIT/ML Solostar Pen Commonly known as:  LANTUS SOLOSTAR   Insulin Pen Needle 31G X 6 MM Misc  TAKE these medications   acetaminophen 325 MG tablet Commonly known as:  TYLENOL Take 650 mg by mouth every 4 (four) hours as needed for mild pain, moderate pain or headache.   Amantadine HCl 100 MG tablet Take 50 mg by mouth 2 (two) times daily.   atorvastatin 20 MG tablet Commonly known as:  LIPITOR Take 1  tablet (20 mg total) by mouth daily at 6 PM. What changed:  when to take this   divalproex 125 MG capsule Commonly known as:  DEPAKOTE SPRINKLE Take 1 capsule (125 mg total) by mouth every 8 (eight) hours. Replaces:  divalproex 125 MG DR tablet   fish oil-omega-3 fatty acids 1000 MG capsule Take 1 g by mouth daily.   gabapentin 100 MG capsule Commonly known as:  NEURONTIN Take 1 capsule (100 mg total) by mouth at bedtime. What changed:    medication strength  how much to take   insulin aspart 100 UNIT/ML injection Commonly known as:  novoLOG 0-9 Units, Subcutaneous, 3 times daily with meals,  CBG < 70: implement hypoglycemia protocol CBG 70 - 120: 0 units CBG 121 - 150: 1 unit CBG 151 - 200: 2 units CBG 201 - 250: 3 units CBG 251 - 300: 5 units CBG 301 - 350: 7 units CBG 351 - 400: 9 units CBG > 400: call MD and obtain STAT lab verification   LORazepam 0.5 MG tablet Commonly known as:  ATIVAN Take 1 tablet (0.5 mg total) by mouth at bedtime. What changed:    when to take this  reasons to take this   magnesium oxide 400 (241.3 Mg) MG tablet Commonly known as:  MAG-OX Take 1 tablet (400 mg total) by mouth daily.   multivitamin with minerals Tabs tablet Take 1 tablet by mouth daily.   potassium chloride 10 MEQ tablet Commonly known as:  K-DUR Take 1 tablet (10 mEq total) by mouth daily.      Follow-up Information    Golden Circle, FNP. Schedule an appointment as soon as possible for a visit in 1 week(s).   Specialties:  Family Medicine, Infectious Diseases Contact information: [address] [place] [address] [phone]          Allergies  Allergen Reactions  . Sulfa Antibiotics Other (See Comments)    As noted on MAR  . Paxil [Paroxetine Hcl] Other (See Comments)    Dreams/ nightmares  . Iopamidol Rash    Consultations:   None  Other Procedures/Studies: Ct Head Wo Contrast  Result Date: 05/29/2018 CLINICAL DATA:  Worsening of  mental status over the last 3 days. History of previous stroke. EXAM: CT HEAD WITHOUT CONTRAST TECHNIQUE: Contiguous axial images were obtained from the base of the skull through the vertex without intravenous contrast. COMPARISON:  MRI 05/26/2018.  CT 05/26/2018. FINDINGS: Brain: Chronic generalized atrophy with advanced chronic small-vessel ischemic changes throughout the cerebral hemispheric white matter. No sign of acute infarction, mass lesion, hemorrhage, hydrocephalus or extra-axial collection. Vascular: There is atherosclerotic calcification of the major vessels at the base of the brain. Skull: Normal Sinuses/Orbits: Clear/normal Other: None IMPRESSION: No acute finding by CT. Generalized atrophy with extensive chronic small-vessel ischemic changes throughout the brain. Electronically Signed   By: Nelson Chimes M.D.   On: 05/29/2018 08:22   Ct Head Wo Contrast  Result Date: 05/26/2018 CLINICAL DATA:  Altered mental status today.  Weakness. EXAM: CT HEAD WITHOUT CONTRAST TECHNIQUE: Contiguous axial images were obtained from the base of the skull  through the vertex without intravenous contrast. COMPARISON:  Head CT scan 05/07/2018.  Brain MRI 01/05/2018. FINDINGS: Brain: No evidence of acute infarction, hemorrhage, hydrocephalus, extra-axial collection or mass lesion/mass effect. Atrophy and extensive chronic microvascular ischemic change are seen as on the prior studies. Vascular: Atherosclerosis noted. Skull: Intact.  No focal lesion. Sinuses/Orbits: Negative. Other: None. IMPRESSION: No acute abnormality. Atrophy and extensive chronic microvascular ischemic change. Atherosclerosis. Electronically Signed   By: Inge Rise M.D.   On: 05/26/2018 13:18   Ct Head Wo Contrast  Result Date: 05/07/2018 CLINICAL DATA:  Unwitnessed fall from wheelchair. Diffuse pain. History of stroke, hyperlipidemia, diabetes. EXAM: CT HEAD WITHOUT CONTRAST CT CERVICAL SPINE WITHOUT CONTRAST TECHNIQUE: Multidetector CT  imaging of the head and cervical spine was performed following the standard protocol without intravenous contrast. Multiplanar CT image reconstructions of the cervical spine were also generated. COMPARISON:  CT HEAD January 09, 2018 and CT cervical spine September 08, 2016. FINDINGS: CT HEAD FINDINGS BRAIN: No intraparenchymal hemorrhage, mass effect nor midline shift. Moderate to severe parenchymal brain volume loss. No hydrocephalus. Resolution of intraventricular blood products. Old LEFT basal ganglia and LEFT thalamus lacunar infarcts. Confluent supratentorial white matter hypodensities. No acute large vascular territory infarcts. No abnormal extra-axial fluid collections. Basal cisterns are patent. VASCULAR: Moderate calcific atherosclerosis of the carotid siphons. SKULL: No skull fracture. No significant scalp soft tissue swelling. SINUSES/ORBITS: The mastoid air-cells and included paranasal sinuses are well-aerated.The included ocular globes and orbital contents are non-suspicious. OTHER: Patient is edentulous. CT CERVICAL SPINE FINDINGS ALIGNMENT: Broad reversed lordosis.  Vertebral bodies in alignment. SKULL BASE AND VERTEBRAE: Cervical vertebral bodies and posterior elements are intact. Moderate to severe C5-6 disc height loss and endplate spurring compatible with degenerative disc, stable from prior imaging. No destructive bony lesions. C1-2 articulation maintained mild arthropathy. Moderate RIGHT upper cervical facet arthropathy. SOFT TISSUES AND SPINAL CANAL: Nonacute. Mild calcific atherosclerosis carotid bifurcations. DISC LEVELS: No significant osseous canal stenosis or neural foraminal infarcts. UPPER CHEST: Lung apices are clear. OTHER: None. IMPRESSION: CT HEAD: 1. No acute intracranial process. 2. Stable moderate to severe parenchymal brain volume loss. 3. Severe chronic small vessel ischemic changes. Old LEFT basal ganglia and thalamus lacunar infarcts. CT CERVICAL SPINE: 1. No fracture or  malalignment. Electronically Signed   By: Elon Alas M.D.   On: 05/07/2018 23:01   Ct Cervical Spine Wo Contrast  Result Date: 05/07/2018 CLINICAL DATA:  Unwitnessed fall from wheelchair. Diffuse pain. History of stroke, hyperlipidemia, diabetes. EXAM: CT HEAD WITHOUT CONTRAST CT CERVICAL SPINE WITHOUT CONTRAST TECHNIQUE: Multidetector CT imaging of the head and cervical spine was performed following the standard protocol without intravenous contrast. Multiplanar CT image reconstructions of the cervical spine were also generated. COMPARISON:  CT HEAD January 09, 2018 and CT cervical spine September 08, 2016. FINDINGS: CT HEAD FINDINGS BRAIN: No intraparenchymal hemorrhage, mass effect nor midline shift. Moderate to severe parenchymal brain volume loss. No hydrocephalus. Resolution of intraventricular blood products. Old LEFT basal ganglia and LEFT thalamus lacunar infarcts. Confluent supratentorial white matter hypodensities. No acute large vascular territory infarcts. No abnormal extra-axial fluid collections. Basal cisterns are patent. VASCULAR: Moderate calcific atherosclerosis of the carotid siphons. SKULL: No skull fracture. No significant scalp soft tissue swelling. SINUSES/ORBITS: The mastoid air-cells and included paranasal sinuses are well-aerated.The included ocular globes and orbital contents are non-suspicious. OTHER: Patient is edentulous. CT CERVICAL SPINE FINDINGS ALIGNMENT: Broad reversed lordosis.  Vertebral bodies in alignment. SKULL BASE AND VERTEBRAE: Cervical vertebral bodies and posterior elements  are intact. Moderate to severe C5-6 disc height loss and endplate spurring compatible with degenerative disc, stable from prior imaging. No destructive bony lesions. C1-2 articulation maintained mild arthropathy. Moderate RIGHT upper cervical facet arthropathy. SOFT TISSUES AND SPINAL CANAL: Nonacute. Mild calcific atherosclerosis carotid bifurcations. DISC LEVELS: No significant osseous  canal stenosis or neural foraminal infarcts. UPPER CHEST: Lung apices are clear. OTHER: None. IMPRESSION: CT HEAD: 1. No acute intracranial process. 2. Stable moderate to severe parenchymal brain volume loss. 3. Severe chronic small vessel ischemic changes. Old LEFT basal ganglia and thalamus lacunar infarcts. CT CERVICAL SPINE: 1. No fracture or malalignment. Electronically Signed   By: Elon Alas M.D.   On: 05/07/2018 23:01   Ct Knee Right Wo Contrast  Result Date: 05/29/2018 CLINICAL DATA:  Right hip, thigh, and knee pain. No history of trauma. EXAM: CT OF THE RIGHT HIP WITHOUT CONTRAST CT OF THE RIGHT FEMUR WITHOUT CONTRAST CT OF THE RIGHT KNEE WITHOUT CONTRAST TECHNIQUE: Multidetector CT imaging of the right hip, femur, and knee was performed according to the standard protocol. Multiplanar CT image reconstructions were also generated. COMPARISON:  Right hip x-rays dated April 12, 2018. FINDINGS: Bones/Joint/Cartilage No acute fracture or dislocation. Well corticated vertical, slightly concave linear lucency through the lateral aspect of the patella is favored to represent a type 2 bipartite patella. Lateral patellar subluxation. Mild tricompartmental knee joint space narrowing with small subchondral cystic change and marginal osteophytes. Chondrocalcinosis of the medial and lateral menisci. Small knee joint effusion. Mild right hip joint space narrowing with subchondral sclerosis, cystic change, and small femoral head osteophytes. Osteopenia. Ligaments Suboptimally assessed by CT. Muscles and Tendons Moderate atrophy of the gluteus minimus muscle. The visualized muscles and tendons are otherwise grossly unremarkable. The extensor mechanism is intact. Soft tissues Mild soft tissue stranding in the lateral right hip. No fluid collection or hematoma. No soft tissue mass. Vascular calcifications. IMPRESSION: 1. No acute fracture. Well corticated vertical lucency through the lateral aspect of the patella  is favored to represent a type 2 bipartite patella. 2. Lateral patellar subluxation with small knee joint effusion. 3. Mild hip and knee osteoarthritis. 4. Chondrocalcinosis of the menisci, which can be seen with CPPD arthropathy. Electronically Signed   By: Titus Dubin M.D.   On: 05/29/2018 19:42   Mr Brain Wo Contrast  Result Date: 05/26/2018 CLINICAL DATA:  Initial evaluation for acute weakness, history of prior CVA with left-sided deficits. EXAM: MRI HEAD WITHOUT CONTRAST TECHNIQUE: Multiplanar, multiecho pulse sequences of the brain and surrounding structures were obtained without intravenous contrast. COMPARISON:  Prior CT from earlier the same day as well as previous MRI from 01/05/2018. FINDINGS: Brain: Advanced cerebral atrophy with chronic small vessel ischemic disease, stable from previous. Remote lacunar infarcts present within the left basal ganglia, left thalamus, and left pons. Few probable tiny remote infarcts noted within the bilateral cerebellar hemispheres. No abnormal foci of restricted diffusion to suggest acute ischemic infarct. Gray-white matter differentiation maintained. No evidence for acute intracranial hemorrhage. Chronic hemosiderin staining seen along the ventricular appended my of the lateral ventricles, consistent with prior intraventricular hemorrhage. Scattered susceptibility artifacts seen involving the bilateral temporal occipital regions also consistent with prior hemorrhage. No mass lesion or midline shift. Diffuse ventricular prominence, like related global parenchymal volume loss of hydrocephalus, stable. No extra-axial fluid collection. Major dural sinuses patent. Normal pituitary gland.  Corpus callosum is thinned and atrophic. Vascular: Major intracranial vascular flow voids maintained. Skull and upper cervical spine: Craniocervical junction normal. Upper  cervical spine within normal limits. Bone marrow signal intensity within normal limits. No scalp soft tissue  abnormality. Sinuses/Orbits: Globes and orbital soft tissues demonstrate no acute finding. Paranasal sinuses clear. Small left mastoid effusion noted, of doubtful significance. Inner ear structures normal. Other: None. IMPRESSION: 1. No acute intracranial abnormality. 2. Advanced cerebral atrophy with chronic small vessel ischemic disease with remote lacunar infarcts involving the left basal ganglia, left thalamus, and left paramedian pons. Electronically Signed   By: Jeannine Boga M.D.   On: 05/26/2018 21:50   Ct Hip Right Wo Contrast  Result Date: 05/29/2018 CLINICAL DATA:  Right hip, thigh, and knee pain. No history of trauma. EXAM: CT OF THE RIGHT HIP WITHOUT CONTRAST CT OF THE RIGHT FEMUR WITHOUT CONTRAST CT OF THE RIGHT KNEE WITHOUT CONTRAST TECHNIQUE: Multidetector CT imaging of the right hip, femur, and knee was performed according to the standard protocol. Multiplanar CT image reconstructions were also generated. COMPARISON:  Right hip x-rays dated April 12, 2018. FINDINGS: Bones/Joint/Cartilage No acute fracture or dislocation. Well corticated vertical, slightly concave linear lucency through the lateral aspect of the patella is favored to represent a type 2 bipartite patella. Lateral patellar subluxation. Mild tricompartmental knee joint space narrowing with small subchondral cystic change and marginal osteophytes. Chondrocalcinosis of the medial and lateral menisci. Small knee joint effusion. Mild right hip joint space narrowing with subchondral sclerosis, cystic change, and small femoral head osteophytes. Osteopenia. Ligaments Suboptimally assessed by CT. Muscles and Tendons Moderate atrophy of the gluteus minimus muscle. The visualized muscles and tendons are otherwise grossly unremarkable. The extensor mechanism is intact. Soft tissues Mild soft tissue stranding in the lateral right hip. No fluid collection or hematoma. No soft tissue mass. Vascular calcifications. IMPRESSION: 1. No acute  fracture. Well corticated vertical lucency through the lateral aspect of the patella is favored to represent a type 2 bipartite patella. 2. Lateral patellar subluxation with small knee joint effusion. 3. Mild hip and knee osteoarthritis. 4. Chondrocalcinosis of the menisci, which can be seen with CPPD arthropathy. Electronically Signed   By: Titus Dubin M.D.   On: 05/29/2018 19:42   Ct Femur Right Wo Contrast  Result Date: 05/29/2018 CLINICAL DATA:  Right hip, thigh, and knee pain. No history of trauma. EXAM: CT OF THE RIGHT HIP WITHOUT CONTRAST CT OF THE RIGHT FEMUR WITHOUT CONTRAST CT OF THE RIGHT KNEE WITHOUT CONTRAST TECHNIQUE: Multidetector CT imaging of the right hip, femur, and knee was performed according to the standard protocol. Multiplanar CT image reconstructions were also generated. COMPARISON:  Right hip x-rays dated April 12, 2018. FINDINGS: Bones/Joint/Cartilage No acute fracture or dislocation. Well corticated vertical, slightly concave linear lucency through the lateral aspect of the patella is favored to represent a type 2 bipartite patella. Lateral patellar subluxation. Mild tricompartmental knee joint space narrowing with small subchondral cystic change and marginal osteophytes. Chondrocalcinosis of the medial and lateral menisci. Small knee joint effusion. Mild right hip joint space narrowing with subchondral sclerosis, cystic change, and small femoral head osteophytes. Osteopenia. Ligaments Suboptimally assessed by CT. Muscles and Tendons Moderate atrophy of the gluteus minimus muscle. The visualized muscles and tendons are otherwise grossly unremarkable. The extensor mechanism is intact. Soft tissues Mild soft tissue stranding in the lateral right hip. No fluid collection or hematoma. No soft tissue mass. Vascular calcifications. IMPRESSION: 1. No acute fracture. Well corticated vertical lucency through the lateral aspect of the patella is favored to represent a type 2 bipartite  patella. 2. Lateral patellar subluxation with  small knee joint effusion. 3. Mild hip and knee osteoarthritis. 4. Chondrocalcinosis of the menisci, which can be seen with CPPD arthropathy. Electronically Signed   By: Titus Dubin M.D.   On: 05/29/2018 19:42   Dg Chest Port 1 View  Result Date: 05/29/2018 CLINICAL DATA:  Altered mental status, history stroke, diabetes mellitus EXAM: PORTABLE CHEST 1 VIEW COMPARISON:  Portable exam 0712 hours compared to 05/26/2018 FINDINGS: Rotated to the LEFT. Normal heart size, mediastinal contours, and pulmonary vascularity. Decreased lung volumes with bibasilar atelectasis. Remaining lungs clear. No pleural effusion or pneumothorax. Bones demineralized. IMPRESSION: Bibasilar atelectasis. Electronically Signed   By: Lavonia Dana M.D.   On: 05/29/2018 07:23   Dg Chest Port 1 View  Result Date: 05/26/2018 CLINICAL DATA:  Altered mental status EXAM: PORTABLE CHEST 1 VIEW COMPARISON:  January 09, 2018 FINDINGS: There is slight elevation of the left hemidiaphragm, stable. There is no edema or consolidation. Heart size and pulmonary vascularity are normal. No adenopathy. No bone lesions. IMPRESSION: No edema or consolidation.  Stable cardiac silhouette. Electronically Signed   By: Lowella Grip III M.D.   On: 05/26/2018 13:32      TODAY-DAY OF DISCHARGE:  Subjective:   Sheila [name] today seems to be pleasant-smiling when I walked in-squeezing my goes when she asked.  Able to say yes or no.  She does not seem to be very far from the usual baseline.  Objective:   Blood pressure 119/71, pulse 85, temperature 99.4 F (37.4 C), temperature source Axillary, resp. rate 17, height 5\' 6"  (1.676 m), weight 73 kg (161 lb), SpO2 98 %.  Intake/Output Summary (Last 24 hours) at 06/01/2018 0857 Last data filed at 05/31/2018 1841 Gross per 24 hour  Intake 776.75 ml  Output 600 ml  Net 176.75 ml   Filed Weights   05/29/18 1646  Weight: 73 kg (161 lb)    Exam: General  appearance: Chronically ill-appearing-answers yes or no to my questions this morning.  Not in any distress. Eyes:Pink conjunctiva HEENT: Atraumatic and Normocephalic Neck: supple Resp:Good air entry bilaterally, no added sounds  CVS: S1 S2 regular, no murmurs.  GI: Bowel sounds present, Non tender and not distended with no gaurding, rigidity or rebound.No organomegaly Extremities: B/L Lower Ext shows no edema Neurology: Opens eyes-smiles-just barely moves all 4 extremities-has generalized weakness all over. Musculoskeletal:No digital cyanosis Skin:No Rash, warm and dry Wounds:N/A   PERTINENT RADIOLOGIC STUDIES: Ct Head Wo Contrast  Result Date: 05/29/2018 CLINICAL DATA:  Worsening of mental status over the last 3 days. History of previous stroke. EXAM: CT HEAD WITHOUT CONTRAST TECHNIQUE: Contiguous axial images were obtained from the base of the skull through the vertex without intravenous contrast. COMPARISON:  MRI 05/26/2018.  CT 05/26/2018. FINDINGS: Brain: Chronic generalized atrophy with advanced chronic small-vessel ischemic changes throughout the cerebral hemispheric white matter. No sign of acute infarction, mass lesion, hemorrhage, hydrocephalus or extra-axial collection. Vascular: There is atherosclerotic calcification of the major vessels at the base of the brain. Skull: Normal Sinuses/Orbits: Clear/normal Other: None IMPRESSION: No acute finding by CT. Generalized atrophy with extensive chronic small-vessel ischemic changes throughout the brain. Electronically Signed   By: Nelson Chimes M.D.   On: 05/29/2018 08:22   Ct Head Wo Contrast  Result Date: 05/26/2018 CLINICAL DATA:  Altered mental status today.  Weakness. EXAM: CT HEAD WITHOUT CONTRAST TECHNIQUE: Contiguous axial images were obtained from the base of the skull through the vertex without intravenous contrast. COMPARISON:  Head CT scan 05/07/2018.  Brain MRI 01/05/2018. FINDINGS: Brain: No evidence of acute infarction,  hemorrhage, hydrocephalus, extra-axial collection or mass lesion/mass effect. Atrophy and extensive chronic microvascular ischemic change are seen as on the prior studies. Vascular: Atherosclerosis noted. Skull: Intact.  No focal lesion. Sinuses/Orbits: Negative. Other: None. IMPRESSION: No acute abnormality. Atrophy and extensive chronic microvascular ischemic change. Atherosclerosis. Electronically Signed   By: Inge Rise M.D.   On: 05/26/2018 13:18   Ct Head Wo Contrast  Result Date: 05/07/2018 CLINICAL DATA:  Unwitnessed fall from wheelchair. Diffuse pain. History of stroke, hyperlipidemia, diabetes. EXAM: CT HEAD WITHOUT CONTRAST CT CERVICAL SPINE WITHOUT CONTRAST TECHNIQUE: Multidetector CT imaging of the head and cervical spine was performed following the standard protocol without intravenous contrast. Multiplanar CT image reconstructions of the cervical spine were also generated. COMPARISON:  CT HEAD January 09, 2018 and CT cervical spine September 08, 2016. FINDINGS: CT HEAD FINDINGS BRAIN: No intraparenchymal hemorrhage, mass effect nor midline shift. Moderate to severe parenchymal brain volume loss. No hydrocephalus. Resolution of intraventricular blood products. Old LEFT basal ganglia and LEFT thalamus lacunar infarcts. Confluent supratentorial white matter hypodensities. No acute large vascular territory infarcts. No abnormal extra-axial fluid collections. Basal cisterns are patent. VASCULAR: Moderate calcific atherosclerosis of the carotid siphons. SKULL: No skull fracture. No significant scalp soft tissue swelling. SINUSES/ORBITS: The mastoid air-cells and included paranasal sinuses are well-aerated.The included ocular globes and orbital contents are non-suspicious. OTHER: Patient is edentulous. CT CERVICAL SPINE FINDINGS ALIGNMENT: Broad reversed lordosis.  Vertebral bodies in alignment. SKULL BASE AND VERTEBRAE: Cervical vertebral bodies and posterior elements are intact. Moderate to  severe C5-6 disc height loss and endplate spurring compatible with degenerative disc, stable from prior imaging. No destructive bony lesions. C1-2 articulation maintained mild arthropathy. Moderate RIGHT upper cervical facet arthropathy. SOFT TISSUES AND SPINAL CANAL: Nonacute. Mild calcific atherosclerosis carotid bifurcations. DISC LEVELS: No significant osseous canal stenosis or neural foraminal infarcts. UPPER CHEST: Lung apices are clear. OTHER: None. IMPRESSION: CT HEAD: 1. No acute intracranial process. 2. Stable moderate to severe parenchymal brain volume loss. 3. Severe chronic small vessel ischemic changes. Old LEFT basal ganglia and thalamus lacunar infarcts. CT CERVICAL SPINE: 1. No fracture or malalignment. Electronically Signed   By: Elon Alas M.D.   On: 05/07/2018 23:01   Ct Cervical Spine Wo Contrast  Result Date: 05/07/2018 CLINICAL DATA:  Unwitnessed fall from wheelchair. Diffuse pain. History of stroke, hyperlipidemia, diabetes. EXAM: CT HEAD WITHOUT CONTRAST CT CERVICAL SPINE WITHOUT CONTRAST TECHNIQUE: Multidetector CT imaging of the head and cervical spine was performed following the standard protocol without intravenous contrast. Multiplanar CT image reconstructions of the cervical spine were also generated. COMPARISON:  CT HEAD January 09, 2018 and CT cervical spine September 08, 2016. FINDINGS: CT HEAD FINDINGS BRAIN: No intraparenchymal hemorrhage, mass effect nor midline shift. Moderate to severe parenchymal brain volume loss. No hydrocephalus. Resolution of intraventricular blood products. Old LEFT basal ganglia and LEFT thalamus lacunar infarcts. Confluent supratentorial white matter hypodensities. No acute large vascular territory infarcts. No abnormal extra-axial fluid collections. Basal cisterns are patent. VASCULAR: Moderate calcific atherosclerosis of the carotid siphons. SKULL: No skull fracture. No significant scalp soft tissue swelling. SINUSES/ORBITS: The mastoid  air-cells and included paranasal sinuses are well-aerated.The included ocular globes and orbital contents are non-suspicious. OTHER: Patient is edentulous. CT CERVICAL SPINE FINDINGS ALIGNMENT: Broad reversed lordosis.  Vertebral bodies in alignment. SKULL BASE AND VERTEBRAE: Cervical vertebral bodies and posterior elements are intact. Moderate to severe C5-6 disc height loss and endplate spurring  compatible with degenerative disc, stable from prior imaging. No destructive bony lesions. C1-2 articulation maintained mild arthropathy. Moderate RIGHT upper cervical facet arthropathy. SOFT TISSUES AND SPINAL CANAL: Nonacute. Mild calcific atherosclerosis carotid bifurcations. DISC LEVELS: No significant osseous canal stenosis or neural foraminal infarcts. UPPER CHEST: Lung apices are clear. OTHER: None. IMPRESSION: CT HEAD: 1. No acute intracranial process. 2. Stable moderate to severe parenchymal brain volume loss. 3. Severe chronic small vessel ischemic changes. Old LEFT basal ganglia and thalamus lacunar infarcts. CT CERVICAL SPINE: 1. No fracture or malalignment. Electronically Signed   By: Elon Alas M.D.   On: 05/07/2018 23:01   Ct Knee Right Wo Contrast  Result Date: 05/29/2018 CLINICAL DATA:  Right hip, thigh, and knee pain. No history of trauma. EXAM: CT OF THE RIGHT HIP WITHOUT CONTRAST CT OF THE RIGHT FEMUR WITHOUT CONTRAST CT OF THE RIGHT KNEE WITHOUT CONTRAST TECHNIQUE: Multidetector CT imaging of the right hip, femur, and knee was performed according to the standard protocol. Multiplanar CT image reconstructions were also generated. COMPARISON:  Right hip x-rays dated April 12, 2018. FINDINGS: Bones/Joint/Cartilage No acute fracture or dislocation. Well corticated vertical, slightly concave linear lucency through the lateral aspect of the patella is favored to represent a type 2 bipartite patella. Lateral patellar subluxation. Mild tricompartmental knee joint space narrowing with small  subchondral cystic change and marginal osteophytes. Chondrocalcinosis of the medial and lateral menisci. Small knee joint effusion. Mild right hip joint space narrowing with subchondral sclerosis, cystic change, and small femoral head osteophytes. Osteopenia. Ligaments Suboptimally assessed by CT. Muscles and Tendons Moderate atrophy of the gluteus minimus muscle. The visualized muscles and tendons are otherwise grossly unremarkable. The extensor mechanism is intact. Soft tissues Mild soft tissue stranding in the lateral right hip. No fluid collection or hematoma. No soft tissue mass. Vascular calcifications. IMPRESSION: 1. No acute fracture. Well corticated vertical lucency through the lateral aspect of the patella is favored to represent a type 2 bipartite patella. 2. Lateral patellar subluxation with small knee joint effusion. 3. Mild hip and knee osteoarthritis. 4. Chondrocalcinosis of the menisci, which can be seen with CPPD arthropathy. Electronically Signed   By: Titus Dubin M.D.   On: 05/29/2018 19:42   Mr Brain Wo Contrast  Result Date: 05/26/2018 CLINICAL DATA:  Initial evaluation for acute weakness, history of prior CVA with left-sided deficits. EXAM: MRI HEAD WITHOUT CONTRAST TECHNIQUE: Multiplanar, multiecho pulse sequences of the brain and surrounding structures were obtained without intravenous contrast. COMPARISON:  Prior CT from earlier the same day as well as previous MRI from 01/05/2018. FINDINGS: Brain: Advanced cerebral atrophy with chronic small vessel ischemic disease, stable from previous. Remote lacunar infarcts present within the left basal ganglia, left thalamus, and left pons. Few probable tiny remote infarcts noted within the bilateral cerebellar hemispheres. No abnormal foci of restricted diffusion to suggest acute ischemic infarct. Gray-white matter differentiation maintained. No evidence for acute intracranial hemorrhage. Chronic hemosiderin staining seen along the ventricular  appended my of the lateral ventricles, consistent with prior intraventricular hemorrhage. Scattered susceptibility artifacts seen involving the bilateral temporal occipital regions also consistent with prior hemorrhage. No mass lesion or midline shift. Diffuse ventricular prominence, like related global parenchymal volume loss of hydrocephalus, stable. No extra-axial fluid collection. Major dural sinuses patent. Normal pituitary gland.  Corpus callosum is thinned and atrophic. Vascular: Major intracranial vascular flow voids maintained. Skull and upper cervical spine: Craniocervical junction normal. Upper cervical spine within normal limits. Bone marrow signal intensity within normal limits.  No scalp soft tissue abnormality. Sinuses/Orbits: Globes and orbital soft tissues demonstrate no acute finding. Paranasal sinuses clear. Small left mastoid effusion noted, of doubtful significance. Inner ear structures normal. Other: None. IMPRESSION: 1. No acute intracranial abnormality. 2. Advanced cerebral atrophy with chronic small vessel ischemic disease with remote lacunar infarcts involving the left basal ganglia, left thalamus, and left paramedian pons. Electronically Signed   By: Jeannine Boga M.D.   On: 05/26/2018 21:50   Ct Hip Right Wo Contrast  Result Date: 05/29/2018 CLINICAL DATA:  Right hip, thigh, and knee pain. No history of trauma. EXAM: CT OF THE RIGHT HIP WITHOUT CONTRAST CT OF THE RIGHT FEMUR WITHOUT CONTRAST CT OF THE RIGHT KNEE WITHOUT CONTRAST TECHNIQUE: Multidetector CT imaging of the right hip, femur, and knee was performed according to the standard protocol. Multiplanar CT image reconstructions were also generated. COMPARISON:  Right hip x-rays dated April 12, 2018. FINDINGS: Bones/Joint/Cartilage No acute fracture or dislocation. Well corticated vertical, slightly concave linear lucency through the lateral aspect of the patella is favored to represent a type 2 bipartite patella. Lateral  patellar subluxation. Mild tricompartmental knee joint space narrowing with small subchondral cystic change and marginal osteophytes. Chondrocalcinosis of the medial and lateral menisci. Small knee joint effusion. Mild right hip joint space narrowing with subchondral sclerosis, cystic change, and small femoral head osteophytes. Osteopenia. Ligaments Suboptimally assessed by CT. Muscles and Tendons Moderate atrophy of the gluteus minimus muscle. The visualized muscles and tendons are otherwise grossly unremarkable. The extensor mechanism is intact. Soft tissues Mild soft tissue stranding in the lateral right hip. No fluid collection or hematoma. No soft tissue mass. Vascular calcifications. IMPRESSION: 1. No acute fracture. Well corticated vertical lucency through the lateral aspect of the patella is favored to represent a type 2 bipartite patella. 2. Lateral patellar subluxation with small knee joint effusion. 3. Mild hip and knee osteoarthritis. 4. Chondrocalcinosis of the menisci, which can be seen with CPPD arthropathy. Electronically Signed   By: Titus Dubin M.D.   On: 05/29/2018 19:42   Ct Femur Right Wo Contrast  Result Date: 05/29/2018 CLINICAL DATA:  Right hip, thigh, and knee pain. No history of trauma. EXAM: CT OF THE RIGHT HIP WITHOUT CONTRAST CT OF THE RIGHT FEMUR WITHOUT CONTRAST CT OF THE RIGHT KNEE WITHOUT CONTRAST TECHNIQUE: Multidetector CT imaging of the right hip, femur, and knee was performed according to the standard protocol. Multiplanar CT image reconstructions were also generated. COMPARISON:  Right hip x-rays dated April 12, 2018. FINDINGS: Bones/Joint/Cartilage No acute fracture or dislocation. Well corticated vertical, slightly concave linear lucency through the lateral aspect of the patella is favored to represent a type 2 bipartite patella. Lateral patellar subluxation. Mild tricompartmental knee joint space narrowing with small subchondral cystic change and marginal osteophytes.  Chondrocalcinosis of the medial and lateral menisci. Small knee joint effusion. Mild right hip joint space narrowing with subchondral sclerosis, cystic change, and small femoral head osteophytes. Osteopenia. Ligaments Suboptimally assessed by CT. Muscles and Tendons Moderate atrophy of the gluteus minimus muscle. The visualized muscles and tendons are otherwise grossly unremarkable. The extensor mechanism is intact. Soft tissues Mild soft tissue stranding in the lateral right hip. No fluid collection or hematoma. No soft tissue mass. Vascular calcifications. IMPRESSION: 1. No acute fracture. Well corticated vertical lucency through the lateral aspect of the patella is favored to represent a type 2 bipartite patella. 2. Lateral patellar subluxation with small knee joint effusion. 3. Mild hip and knee osteoarthritis. 4. Chondrocalcinosis of  the menisci, which can be seen with CPPD arthropathy. Electronically Signed   By: Titus Dubin M.D.   On: 05/29/2018 19:42   Dg Chest Port 1 View  Result Date: 05/29/2018 CLINICAL DATA:  Altered mental status, history stroke, diabetes mellitus EXAM: PORTABLE CHEST 1 VIEW COMPARISON:  Portable exam 0712 hours compared to 05/26/2018 FINDINGS: Rotated to the LEFT. Normal heart size, mediastinal contours, and pulmonary vascularity. Decreased lung volumes with bibasilar atelectasis. Remaining lungs clear. No pleural effusion or pneumothorax. Bones demineralized. IMPRESSION: Bibasilar atelectasis. Electronically Signed   By: Lavonia Dana M.D.   On: 05/29/2018 07:23   Dg Chest Port 1 View  Result Date: 05/26/2018 CLINICAL DATA:  Altered mental status EXAM: PORTABLE CHEST 1 VIEW COMPARISON:  January 09, 2018 FINDINGS: There is slight elevation of the left hemidiaphragm, stable. There is no edema or consolidation. Heart size and pulmonary vascularity are normal. No adenopathy. No bone lesions. IMPRESSION: No edema or consolidation.  Stable cardiac silhouette. Electronically Signed    By: Lowella Grip III M.D.   On: 05/26/2018 13:32     PERTINENT LAB RESULTS: CBC: Recent Labs    05/30/18 0410 05/31/18 0635  WBC 6.7 5.0  HGB 12.1 11.2*  HCT 34.4* 31.6*  PLT 322 329   CMET CMP     Component Value Date/Time   NA 139 05/31/2018 0635   NA 135 (A) 02/25/2016   K 3.8 05/31/2018 0635   CL 110 05/31/2018 0635   CO2 22 05/31/2018 0635   GLUCOSE 169 (H) 05/31/2018 0635   BUN 6 05/31/2018 0635   BUN 18 02/25/2016   CREATININE 0.78 05/31/2018 0635   CALCIUM 8.8 (L) 05/31/2018 0635   PROT 5.6 (L) 05/30/2018 0410   ALBUMIN 2.9 (L) 05/30/2018 0410   AST 25 05/30/2018 0410   ALT 18 05/30/2018 0410   ALKPHOS 98 05/30/2018 0410   BILITOT 0.7 05/30/2018 0410   GFRNONAA >60 05/31/2018 0635   GFRAA >60 05/31/2018 0635    GFR Estimated Creatinine Clearance: 68.9 mL/min (by C-G formula based on SCr of 0.78 mg/dL). No results for input(s): LIPASE, AMYLASE in the last 72 hours. No results for input(s): CKTOTAL, CKMB, CKMBINDEX, TROPONINI in the last 72 hours. Invalid input(s): POCBNP No results for input(s): DDIMER in the last 72 hours. No results for input(s): HGBA1C in the last 72 hours. No results for input(s): CHOL, HDL, LDLCALC, TRIG, CHOLHDL, LDLDIRECT in the last 72 hours. Recent Labs    05/30/18 1428  TSH 1.500   Recent Labs    05/30/18 1428  VITAMINB12 1,155*   Coags: No results for input(s): INR in the last 72 hours.  Invalid input(s): PT Microbiology: Recent Results (from the past 240 hour(s))  Urine culture     Status: Abnormal   Collection Time: 05/29/18  7:52 AM  Result Value Ref Range Status   Specimen Description URINE, RANDOM  Final   Special Requests   Final    unknown Normal Performed at [facility], [address], [place], [place] [address]    Culture MULTIPLE SPECIES PRESENT, SUGGEST RECOLLECTION (A)  Final   Report Status 05/30/2018 FINAL  Final  MRSA PCR Screening     Status: None   Collection Time: 05/29/18   8:47 PM  Result Value Ref Range Status   MRSA by PCR NEGATIVE NEGATIVE Final    Comment:        The GeneXpert MRSA Assay (FDA approved for NASAL specimens only), is one component of  a comprehensive MRSA colonization surveillance program. It is not intended to diagnose MRSA infection nor to guide or monitor treatment for MRSA infections. Performed at [facility], [place] [address] [place], [place] [address]     FURTHER DISCHARGE INSTRUCTIONS:  Get Medicines reviewed and adjusted: Please take all your medications with you for your next visit with your Primary MD  Laboratory/radiological data: Please request your Primary MD to go over all hospital tests and procedure/radiological results at the follow up, please ask your Primary MD to get all Hospital records sent to his/her office.  In some cases, they will be blood work, cultures and biopsy results pending at the time of your discharge. Please request that your primary care M.D. goes through all the records of your hospital data and follows up on these results.  Also Note the following: If you experience worsening of your admission symptoms, develop shortness of breath, life threatening emergency, suicidal or homicidal thoughts you must seek medical attention immediately by calling 911 or calling your MD immediately  if symptoms less severe.  You must read complete instructions/literature along with all the possible adverse reactions/side effects for all the Medicines you take and that have been prescribed to you. Take any new Medicines after you have completely understood and accpet all the possible adverse reactions/side effects.   Do not drive when taking Pain medications or sleeping medications (Benzodaizepines)  Do not take more than prescribed Pain, Sleep and Anxiety Medications. It is not advisable to combine anxiety,sleep and pain medications without talking with your primary care practitioner  Special Instructions: If  you have smoked or chewed Tobacco  in the last 2 yrs please stop smoking, stop any regular Alcohol  and or any Recreational drug use.  Wear Seat belts while driving.  Please note: You were cared for by a hospitalist during your hospital stay. Once you are discharged, your primary care physician will handle any further medical issues. Please note that NO REFILLS for any discharge medications will be authorized once you are discharged, as it is imperative that you return to your primary care physician (or establish a relationship with a primary care physician if you do not have one) for your post hospital discharge needs so that they can reassess your need for medications and monitor your lab values.  Total Time spent coordinating discharge including counseling, education and face to face time equals  35 minutes.  SignedOren Binet 06/01/2018 8:57 AM

## 2018-06-01 NOTE — Consult Note (Signed)
   [facility] CM Inpatient Consult   06/01/2018  Sheila [name] 19-Jan-1951 [id number]    Patient screened for potential [facility] Care Management needs due to hospital readmission and increased unplanned readmission risk score of 28% (high).   Chart reviewed. Noted discharge plan is to discharge to [facility].   No identifiable [facility] Care Management needs at this time.   Marthenia Rolling, MSN-Ed, RN,BSN [facility] Liaison [phone]

## 2018-06-01 NOTE — Progress Notes (Signed)
  Speech Language Pathology Treatment: Dysphagia  Patient Details Name: Sheila [name] MRN: [id number] DOB: [birth year] Today's Date: 06/01/2018 Time: 2202-5427 SLP Time Calculation (min) (ACUTE ONLY): 9 min  Assessment / Plan / Recommendation Clinical Impression  Pt is alert and more verbal today, although primarily responding "yea" regardless of stimulus. NT reports no overt difficulties with breakfast but with limited intake. Pt consumed purees and liquids still with prolonged oral phase, noted with solids > liquids. No overt signs of aspiration are observed. Recommend continuing current diet.    HPI HPI: Pt is a 67 year old female with history of prior ICH, prior CVAs with resultant partial global aphasia, diabetes mellitus, diabetic neuropathy, PAD, hyperlipidemia, who is being brought to the hospital from her SNF due to acute encephalopathy. CT Head negative for acute findings. Recent MRI completed 05/26/18 also negative. BSE January 2019 recommended NPO due to cpgnitive-linguistic impairments but with advancement to Dys 1 diet and thin liquids prior to d/c.      SLP Plan  Continue with current plan of care       Recommendations  Diet recommendations: Dysphagia 1 (puree);Thin liquid Liquids provided via: Cup;Straw Medication Administration: Crushed with puree Supervision: Staff to assist with self feeding;Full supervision/cueing for compensatory strategies Compensations: Minimize environmental distractions;Slow rate;Small sips/bites Postural Changes and/or Swallow Maneuvers: Seated upright 90 degrees                Oral Care Recommendations: Oral care BID Follow up Recommendations: Skilled Nursing facility SLP Visit Diagnosis: Dysphagia, unspecified (R13.10) Plan: Continue with current plan of care       GO                Germain Osgood 06/01/2018, 11:18 AM  Germain Osgood, M.A. CCC-SLP [phone]

## 2018-06-08 DIAGNOSIS — I4891 Unspecified atrial fibrillation: Secondary | ICD-10-CM | POA: Diagnosis not present

## 2018-06-08 DIAGNOSIS — I10 Essential (primary) hypertension: Secondary | ICD-10-CM | POA: Diagnosis not present

## 2018-06-08 DIAGNOSIS — N390 Urinary tract infection, site not specified: Secondary | ICD-10-CM | POA: Diagnosis not present

## 2018-06-08 DIAGNOSIS — N186 End stage renal disease: Secondary | ICD-10-CM | POA: Diagnosis not present

## 2018-06-08 DIAGNOSIS — F0390 Unspecified dementia without behavioral disturbance: Secondary | ICD-10-CM | POA: Diagnosis not present

## 2018-06-08 DIAGNOSIS — C189 Malignant neoplasm of colon, unspecified: Secondary | ICD-10-CM | POA: Diagnosis not present

## 2018-06-08 DIAGNOSIS — D649 Anemia, unspecified: Secondary | ICD-10-CM | POA: Diagnosis not present

## 2018-06-08 DIAGNOSIS — A419 Sepsis, unspecified organism: Secondary | ICD-10-CM | POA: Diagnosis not present

## 2018-06-08 DIAGNOSIS — R4182 Altered mental status, unspecified: Secondary | ICD-10-CM | POA: Diagnosis not present

## 2018-06-12 ENCOUNTER — Encounter: Payer: Medicare Other | Admitting: Family Medicine

## 2018-06-15 ENCOUNTER — Encounter: Payer: Self-pay | Admitting: Family Medicine

## 2018-06-15 ENCOUNTER — Ambulatory Visit (INDEPENDENT_AMBULATORY_CARE_PROVIDER_SITE_OTHER): Payer: Medicare Other | Admitting: Family Medicine

## 2018-06-15 VITALS — BP 96/58 | HR 76 | Temp 97.6°F | Ht 66.0 in | Wt 151.0 lb

## 2018-06-15 DIAGNOSIS — Z794 Long term (current) use of insulin: Secondary | ICD-10-CM

## 2018-06-15 DIAGNOSIS — R4701 Aphasia: Secondary | ICD-10-CM | POA: Diagnosis not present

## 2018-06-15 DIAGNOSIS — F419 Anxiety disorder, unspecified: Secondary | ICD-10-CM | POA: Diagnosis not present

## 2018-06-15 DIAGNOSIS — E1149 Type 2 diabetes mellitus with other diabetic neurological complication: Secondary | ICD-10-CM

## 2018-06-15 DIAGNOSIS — F0150 Vascular dementia without behavioral disturbance: Secondary | ICD-10-CM | POA: Insufficient documentation

## 2018-06-15 NOTE — Assessment & Plan Note (Addendum)
Previous a1c of 5.5% Unsure of current glycemic control.  Will have records from SNF sent over Continue only SSI.

## 2018-06-15 NOTE — Assessment & Plan Note (Signed)
Currently at SNF which is appropriate at this time Has had severe cognitive decline over the past several months Palliative care consult ordered for GOC/Code status clarification.

## 2018-06-15 NOTE — Progress Notes (Signed)
Sheila [name] - 67 y.o. female MRN [id number]  Date of birth: 01-Sep-1951  Subjective Chief Complaint  Patient presents with  . Establish Care    transfer care/saw PCP 2018    HPI Sheila [name] Sheila [name] is a 67 y.o. female with a history of T2DM, HLD, Anxiety, Vascular dementia 2/2 to numerous CVA's with dysphagia and aphasia here today for hospital follow up.  Recently admitted for acute encephalopathy thought to be from benzodiazepine overuse.  She is non-ambulatory and is unable to complete the majority of her ADL's.  Caregiver from facility accompanying her does not know any of her medical history of how she has been since discharge to SNF and patient says very little.  They are unsure who family is and if they are involved with care but state that they will have someone from SNF reach out to me and let me know.  During hospitalization if was recommended that she have palliative medicine consult due to frail condition and cognitive decline to continue GOC/code status clarification with family.  It is unknown if this was completed.  She does need updated labs completed.  In the hospital her A1c was 5.5% and her long acting insulin was discontinued.  She continues on SSI with novolog.   Her ativan dose was reduced to 0.5mg  only at bedtime.   ROS: Unable to complete ROS due to aphasia.    Allergies  Allergen Reactions  . Sulfa Antibiotics Other (See Comments)    As noted on MAR  . Paxil [Paroxetine Hcl] Other (See Comments)    Dreams/ nightmares  . Iopamidol Rash    Past Medical History:  Diagnosis Date  . Anxiety   . Aphasia   . Arthritis   . Diabetes mellitus    complicated by peripheral neuropathy  . Dysphagia following nontraumatic intracerebral hemorrhage   . Hyperlipidemia   . Muscle weakness (generalized)   . Stroke [facility]) 2015   residual right foot drop    Past Surgical History:  Procedure Laterality Date  . CHOLECYSTECTOMY    . TONSILLECTOMY      Social History    Socioeconomic History  . Marital status: Widowed    Spouse name: Not on file  . Number of children: 3  . Years of education: 34  . Highest education level: Not on file  Occupational History  . Occupation: retired---  [employer]  Social Needs  . Financial resource strain: Not on file  . Food insecurity:    Worry: Not on file    Inability: Not on file  . Transportation needs:    Medical: Not on file    Non-medical: Not on file  Tobacco Use  . Smoking status: Former Smoker    Types: Cigarettes    Last attempt to quit: 02/15/1992    Years since quitting: 26.3  . Smokeless tobacco: Never Used  Substance and Sexual Activity  . Alcohol use: No    Alcohol/week: 0.0 oz    Comment: Socially  . Drug use: No    Types: Marijuana    Comment: SMOKES IT ANY TIME SHE CAN GET IT  . Sexual activity: Not Currently    Partners: Male  Lifestyle  . Physical activity:    Days per week: Not on file    Minutes per session: Not on file  . Stress: Not on file  Relationships  . Social connections:    Talks on phone: Not on file    Gets together: Not on file  Attends religious service: Not on file    Active member of club or organization: Not on file    Attends meetings of clubs or organizations: Not on file    Relationship status: Not on file  Other Topics Concern  . Not on file  Social History Narrative   Fun: Talk, church activities    Denies any religious beliefs effecting health care.    Lives alone in a one story home.  Has 5 children.  Retired from [employer].    Family History  Problem Relation Age of Onset  . Diabetes Mother        Deceased  . Diabetes Brother   . Healthy Son     Health Maintenance  Topic Date Due  . Hepatitis C Screening  [birth year]  . COLONOSCOPY  04/19/2000  . OPHTHALMOLOGY EXAM  03/09/2013  . URINE MICROALBUMIN  01/22/2015  . FOOT EXAM  12/31/2017  . PNA vac Low Risk Adult (2 of 2 - PPSV23) 12/31/2017  . MAMMOGRAM  02/10/2018  .  HEMOGLOBIN A1C  07/05/2018  . INFLUENZA VACCINE  07/27/2018  . TETANUS/TDAP  10/08/2024  . DEXA SCAN  Completed    ----------------------------------------------------------------------------------------------------------------------------------------------------------------------------------------------------------------- Physical Exam BP (!) 96/58   Pulse 76   Temp 97.6 F (36.4 C)   Ht 5\' 6"  (1.676 m)   Wt 151 lb (68.5 kg) Comment: 193lb-42lb for wheelchare  SpO2 96%   BMI 24.37 kg/m   Physical Exam  Constitutional: She appears well-nourished. No distress.  HENT:  Head: Normocephalic and atraumatic.  Mouth/Throat: Oropharynx is clear and moist.  Neck: Neck supple. No thyromegaly present.  Cardiovascular: Normal rate, regular rhythm and normal heart sounds.  Pulmonary/Chest: Effort normal and breath sounds normal.  Abdominal: Soft. Bowel sounds are normal. She exhibits no distension. There is no tenderness.  Musculoskeletal: She exhibits no edema.  Lymphadenopathy:    She has no cervical adenopathy.  Neurological: She is alert.  Seated in wheelchair. Non ambulatory.  Will speak single word responses sometimes.    Psychiatric: She has a normal mood and affect.    ------------------------------------------------------------------------------------------------------------------------------------------------------------------------------------------------------------------- Assessment and Plan Difficult visit today due to caregiver with no knowledge of patient history, no records from SNF provided and patient is aphasic.  Will attempt to get records from SNF.   Multi-infarct dementia without behavioral disturbance Currently at SNF which is appropriate at this time Has had severe cognitive decline over the past several months Palliative care consult ordered for GOC/Code status clarification.  Type 2 diabetes mellitus with neurologic complication (HCC) Previous a1c of  5.5% Unsure of current glycemic control.  Will have records from SNF sent over Continue only SSI.    Anxiety Encephalopathy recently 2/2 to benzo overuse, reduced to ativan 0.5mg  at bedtime.    Labs attempted to be drawn today, will have this attempted again at SNF (BMP, CBC, A1c)

## 2018-06-15 NOTE — Assessment & Plan Note (Signed)
Encephalopathy recently 2/2 to benzo overuse, reduced to ativan 0.5mg  at bedtime.

## 2018-06-15 NOTE — Patient Instructions (Signed)
Continue current medications We'll call with any recommendations based on lab results

## 2018-07-06 DIAGNOSIS — I639 Cerebral infarction, unspecified: Secondary | ICD-10-CM | POA: Diagnosis not present

## 2018-07-06 DIAGNOSIS — F0391 Unspecified dementia with behavioral disturbance: Secondary | ICD-10-CM | POA: Diagnosis not present

## 2018-07-06 DIAGNOSIS — N183 Chronic kidney disease, stage 3 (moderate): Secondary | ICD-10-CM | POA: Diagnosis not present

## 2018-07-06 DIAGNOSIS — E119 Type 2 diabetes mellitus without complications: Secondary | ICD-10-CM | POA: Diagnosis not present

## 2018-07-06 DIAGNOSIS — R569 Unspecified convulsions: Secondary | ICD-10-CM | POA: Diagnosis not present

## 2018-07-06 DIAGNOSIS — R51 Headache: Secondary | ICD-10-CM | POA: Diagnosis not present

## 2018-07-06 DIAGNOSIS — I509 Heart failure, unspecified: Secondary | ICD-10-CM | POA: Diagnosis not present

## 2018-08-25 DIAGNOSIS — M6281 Muscle weakness (generalized): Secondary | ICD-10-CM | POA: Diagnosis not present

## 2018-08-27 DIAGNOSIS — M6281 Muscle weakness (generalized): Secondary | ICD-10-CM | POA: Diagnosis not present

## 2018-08-27 DIAGNOSIS — R4182 Altered mental status, unspecified: Secondary | ICD-10-CM | POA: Diagnosis not present

## 2018-09-07 DIAGNOSIS — R1310 Dysphagia, unspecified: Secondary | ICD-10-CM | POA: Diagnosis not present

## 2018-09-07 DIAGNOSIS — R4701 Aphasia: Secondary | ICD-10-CM | POA: Diagnosis not present

## 2018-09-07 DIAGNOSIS — F411 Generalized anxiety disorder: Secondary | ICD-10-CM | POA: Diagnosis not present

## 2018-09-07 DIAGNOSIS — F0390 Unspecified dementia without behavioral disturbance: Secondary | ICD-10-CM | POA: Diagnosis not present

## 2018-09-07 DIAGNOSIS — E46 Unspecified protein-calorie malnutrition: Secondary | ICD-10-CM | POA: Diagnosis not present

## 2018-09-07 DIAGNOSIS — E119 Type 2 diabetes mellitus without complications: Secondary | ICD-10-CM | POA: Diagnosis not present

## 2018-09-08 DIAGNOSIS — M25552 Pain in left hip: Secondary | ICD-10-CM | POA: Diagnosis not present

## 2018-09-08 DIAGNOSIS — M25551 Pain in right hip: Secondary | ICD-10-CM | POA: Diagnosis not present

## 2018-09-08 DIAGNOSIS — W19XXXA Unspecified fall, initial encounter: Secondary | ICD-10-CM | POA: Diagnosis not present

## 2018-10-03 DIAGNOSIS — M545 Low back pain: Secondary | ICD-10-CM | POA: Diagnosis not present

## 2018-10-04 DIAGNOSIS — M810 Age-related osteoporosis without current pathological fracture: Secondary | ICD-10-CM | POA: Diagnosis not present

## 2018-10-04 DIAGNOSIS — M6281 Muscle weakness (generalized): Secondary | ICD-10-CM | POA: Diagnosis not present

## 2018-10-04 DIAGNOSIS — R4182 Altered mental status, unspecified: Secondary | ICD-10-CM | POA: Diagnosis not present

## 2018-10-04 DIAGNOSIS — I69320 Aphasia following cerebral infarction: Secondary | ICD-10-CM | POA: Diagnosis not present

## 2018-10-04 DIAGNOSIS — E119 Type 2 diabetes mellitus without complications: Secondary | ICD-10-CM | POA: Diagnosis not present

## 2018-10-04 DIAGNOSIS — R2689 Other abnormalities of gait and mobility: Secondary | ICD-10-CM | POA: Diagnosis not present

## 2018-10-04 DIAGNOSIS — R278 Other lack of coordination: Secondary | ICD-10-CM | POA: Diagnosis not present

## 2018-10-04 DIAGNOSIS — M4316 Spondylolisthesis, lumbar region: Secondary | ICD-10-CM | POA: Diagnosis not present

## 2018-10-04 DIAGNOSIS — G9341 Metabolic encephalopathy: Secondary | ICD-10-CM | POA: Diagnosis not present

## 2018-10-05 DIAGNOSIS — M6281 Muscle weakness (generalized): Secondary | ICD-10-CM | POA: Diagnosis not present

## 2018-10-05 DIAGNOSIS — R2689 Other abnormalities of gait and mobility: Secondary | ICD-10-CM | POA: Diagnosis not present

## 2018-10-05 DIAGNOSIS — M4316 Spondylolisthesis, lumbar region: Secondary | ICD-10-CM | POA: Diagnosis not present

## 2018-10-05 DIAGNOSIS — M810 Age-related osteoporosis without current pathological fracture: Secondary | ICD-10-CM | POA: Diagnosis not present

## 2018-10-05 DIAGNOSIS — G9341 Metabolic encephalopathy: Secondary | ICD-10-CM | POA: Diagnosis not present

## 2018-10-05 DIAGNOSIS — R278 Other lack of coordination: Secondary | ICD-10-CM | POA: Diagnosis not present

## 2018-10-06 DIAGNOSIS — M4316 Spondylolisthesis, lumbar region: Secondary | ICD-10-CM | POA: Diagnosis not present

## 2018-10-06 DIAGNOSIS — R2689 Other abnormalities of gait and mobility: Secondary | ICD-10-CM | POA: Diagnosis not present

## 2018-10-06 DIAGNOSIS — R278 Other lack of coordination: Secondary | ICD-10-CM | POA: Diagnosis not present

## 2018-10-06 DIAGNOSIS — M6281 Muscle weakness (generalized): Secondary | ICD-10-CM | POA: Diagnosis not present

## 2018-10-06 DIAGNOSIS — M810 Age-related osteoporosis without current pathological fracture: Secondary | ICD-10-CM | POA: Diagnosis not present

## 2018-10-06 DIAGNOSIS — G9341 Metabolic encephalopathy: Secondary | ICD-10-CM | POA: Diagnosis not present

## 2018-10-07 DIAGNOSIS — M810 Age-related osteoporosis without current pathological fracture: Secondary | ICD-10-CM | POA: Diagnosis not present

## 2018-10-07 DIAGNOSIS — M6281 Muscle weakness (generalized): Secondary | ICD-10-CM | POA: Diagnosis not present

## 2018-10-07 DIAGNOSIS — R278 Other lack of coordination: Secondary | ICD-10-CM | POA: Diagnosis not present

## 2018-10-07 DIAGNOSIS — M4316 Spondylolisthesis, lumbar region: Secondary | ICD-10-CM | POA: Diagnosis not present

## 2018-10-07 DIAGNOSIS — G9341 Metabolic encephalopathy: Secondary | ICD-10-CM | POA: Diagnosis not present

## 2018-10-07 DIAGNOSIS — R2689 Other abnormalities of gait and mobility: Secondary | ICD-10-CM | POA: Diagnosis not present

## 2018-10-09 DIAGNOSIS — R278 Other lack of coordination: Secondary | ICD-10-CM | POA: Diagnosis not present

## 2018-10-09 DIAGNOSIS — M810 Age-related osteoporosis without current pathological fracture: Secondary | ICD-10-CM | POA: Diagnosis not present

## 2018-10-09 DIAGNOSIS — Z23 Encounter for immunization: Secondary | ICD-10-CM | POA: Diagnosis not present

## 2018-10-09 DIAGNOSIS — M6281 Muscle weakness (generalized): Secondary | ICD-10-CM | POA: Diagnosis not present

## 2018-10-09 DIAGNOSIS — M4316 Spondylolisthesis, lumbar region: Secondary | ICD-10-CM | POA: Diagnosis not present

## 2018-10-09 DIAGNOSIS — R2689 Other abnormalities of gait and mobility: Secondary | ICD-10-CM | POA: Diagnosis not present

## 2018-10-09 DIAGNOSIS — G9341 Metabolic encephalopathy: Secondary | ICD-10-CM | POA: Diagnosis not present

## 2018-10-10 DIAGNOSIS — R2689 Other abnormalities of gait and mobility: Secondary | ICD-10-CM | POA: Diagnosis not present

## 2018-10-10 DIAGNOSIS — M6281 Muscle weakness (generalized): Secondary | ICD-10-CM | POA: Diagnosis not present

## 2018-10-10 DIAGNOSIS — G9341 Metabolic encephalopathy: Secondary | ICD-10-CM | POA: Diagnosis not present

## 2018-10-10 DIAGNOSIS — M4316 Spondylolisthesis, lumbar region: Secondary | ICD-10-CM | POA: Diagnosis not present

## 2018-10-10 DIAGNOSIS — M810 Age-related osteoporosis without current pathological fracture: Secondary | ICD-10-CM | POA: Diagnosis not present

## 2018-10-10 DIAGNOSIS — R278 Other lack of coordination: Secondary | ICD-10-CM | POA: Diagnosis not present

## 2018-10-11 DIAGNOSIS — R278 Other lack of coordination: Secondary | ICD-10-CM | POA: Diagnosis not present

## 2018-10-11 DIAGNOSIS — M4316 Spondylolisthesis, lumbar region: Secondary | ICD-10-CM | POA: Diagnosis not present

## 2018-10-11 DIAGNOSIS — G9341 Metabolic encephalopathy: Secondary | ICD-10-CM | POA: Diagnosis not present

## 2018-10-11 DIAGNOSIS — R2689 Other abnormalities of gait and mobility: Secondary | ICD-10-CM | POA: Diagnosis not present

## 2018-10-11 DIAGNOSIS — M6281 Muscle weakness (generalized): Secondary | ICD-10-CM | POA: Diagnosis not present

## 2018-10-11 DIAGNOSIS — M810 Age-related osteoporosis without current pathological fracture: Secondary | ICD-10-CM | POA: Diagnosis not present

## 2018-10-12 DIAGNOSIS — R2689 Other abnormalities of gait and mobility: Secondary | ICD-10-CM | POA: Diagnosis not present

## 2018-10-12 DIAGNOSIS — M4316 Spondylolisthesis, lumbar region: Secondary | ICD-10-CM | POA: Diagnosis not present

## 2018-10-12 DIAGNOSIS — M810 Age-related osteoporosis without current pathological fracture: Secondary | ICD-10-CM | POA: Diagnosis not present

## 2018-10-12 DIAGNOSIS — G9341 Metabolic encephalopathy: Secondary | ICD-10-CM | POA: Diagnosis not present

## 2018-10-12 DIAGNOSIS — F419 Anxiety disorder, unspecified: Secondary | ICD-10-CM | POA: Diagnosis not present

## 2018-10-12 DIAGNOSIS — R278 Other lack of coordination: Secondary | ICD-10-CM | POA: Diagnosis not present

## 2018-10-12 DIAGNOSIS — M6281 Muscle weakness (generalized): Secondary | ICD-10-CM | POA: Diagnosis not present

## 2018-10-12 DIAGNOSIS — F0151 Vascular dementia with behavioral disturbance: Secondary | ICD-10-CM | POA: Diagnosis not present

## 2018-10-13 DIAGNOSIS — G9341 Metabolic encephalopathy: Secondary | ICD-10-CM | POA: Diagnosis not present

## 2018-10-13 DIAGNOSIS — R2689 Other abnormalities of gait and mobility: Secondary | ICD-10-CM | POA: Diagnosis not present

## 2018-10-13 DIAGNOSIS — M810 Age-related osteoporosis without current pathological fracture: Secondary | ICD-10-CM | POA: Diagnosis not present

## 2018-10-13 DIAGNOSIS — R278 Other lack of coordination: Secondary | ICD-10-CM | POA: Diagnosis not present

## 2018-10-13 DIAGNOSIS — M6281 Muscle weakness (generalized): Secondary | ICD-10-CM | POA: Diagnosis not present

## 2018-10-13 DIAGNOSIS — M4316 Spondylolisthesis, lumbar region: Secondary | ICD-10-CM | POA: Diagnosis not present

## 2018-10-16 DIAGNOSIS — M4316 Spondylolisthesis, lumbar region: Secondary | ICD-10-CM | POA: Diagnosis not present

## 2018-10-16 DIAGNOSIS — R2689 Other abnormalities of gait and mobility: Secondary | ICD-10-CM | POA: Diagnosis not present

## 2018-10-16 DIAGNOSIS — G9341 Metabolic encephalopathy: Secondary | ICD-10-CM | POA: Diagnosis not present

## 2018-10-16 DIAGNOSIS — M810 Age-related osteoporosis without current pathological fracture: Secondary | ICD-10-CM | POA: Diagnosis not present

## 2018-10-16 DIAGNOSIS — M6281 Muscle weakness (generalized): Secondary | ICD-10-CM | POA: Diagnosis not present

## 2018-10-16 DIAGNOSIS — R278 Other lack of coordination: Secondary | ICD-10-CM | POA: Diagnosis not present

## 2018-10-17 DIAGNOSIS — M810 Age-related osteoporosis without current pathological fracture: Secondary | ICD-10-CM | POA: Diagnosis not present

## 2018-10-17 DIAGNOSIS — M6281 Muscle weakness (generalized): Secondary | ICD-10-CM | POA: Diagnosis not present

## 2018-10-17 DIAGNOSIS — G9341 Metabolic encephalopathy: Secondary | ICD-10-CM | POA: Diagnosis not present

## 2018-10-17 DIAGNOSIS — M4316 Spondylolisthesis, lumbar region: Secondary | ICD-10-CM | POA: Diagnosis not present

## 2018-10-17 DIAGNOSIS — R2689 Other abnormalities of gait and mobility: Secondary | ICD-10-CM | POA: Diagnosis not present

## 2018-10-17 DIAGNOSIS — R278 Other lack of coordination: Secondary | ICD-10-CM | POA: Diagnosis not present

## 2018-10-18 DIAGNOSIS — M810 Age-related osteoporosis without current pathological fracture: Secondary | ICD-10-CM | POA: Diagnosis not present

## 2018-10-18 DIAGNOSIS — G9341 Metabolic encephalopathy: Secondary | ICD-10-CM | POA: Diagnosis not present

## 2018-10-18 DIAGNOSIS — M6281 Muscle weakness (generalized): Secondary | ICD-10-CM | POA: Diagnosis not present

## 2018-10-18 DIAGNOSIS — R278 Other lack of coordination: Secondary | ICD-10-CM | POA: Diagnosis not present

## 2018-10-18 DIAGNOSIS — R2689 Other abnormalities of gait and mobility: Secondary | ICD-10-CM | POA: Diagnosis not present

## 2018-10-18 DIAGNOSIS — M4316 Spondylolisthesis, lumbar region: Secondary | ICD-10-CM | POA: Diagnosis not present

## 2018-10-19 DIAGNOSIS — M4316 Spondylolisthesis, lumbar region: Secondary | ICD-10-CM | POA: Diagnosis not present

## 2018-10-19 DIAGNOSIS — G9341 Metabolic encephalopathy: Secondary | ICD-10-CM | POA: Diagnosis not present

## 2018-10-19 DIAGNOSIS — R2689 Other abnormalities of gait and mobility: Secondary | ICD-10-CM | POA: Diagnosis not present

## 2018-10-19 DIAGNOSIS — M6281 Muscle weakness (generalized): Secondary | ICD-10-CM | POA: Diagnosis not present

## 2018-10-19 DIAGNOSIS — R278 Other lack of coordination: Secondary | ICD-10-CM | POA: Diagnosis not present

## 2018-10-19 DIAGNOSIS — M810 Age-related osteoporosis without current pathological fracture: Secondary | ICD-10-CM | POA: Diagnosis not present

## 2018-10-20 DIAGNOSIS — R278 Other lack of coordination: Secondary | ICD-10-CM | POA: Diagnosis not present

## 2018-10-20 DIAGNOSIS — M4316 Spondylolisthesis, lumbar region: Secondary | ICD-10-CM | POA: Diagnosis not present

## 2018-10-20 DIAGNOSIS — M6281 Muscle weakness (generalized): Secondary | ICD-10-CM | POA: Diagnosis not present

## 2018-10-20 DIAGNOSIS — M810 Age-related osteoporosis without current pathological fracture: Secondary | ICD-10-CM | POA: Diagnosis not present

## 2018-10-20 DIAGNOSIS — G9341 Metabolic encephalopathy: Secondary | ICD-10-CM | POA: Diagnosis not present

## 2018-10-20 DIAGNOSIS — R2689 Other abnormalities of gait and mobility: Secondary | ICD-10-CM | POA: Diagnosis not present

## 2018-10-23 DIAGNOSIS — R2689 Other abnormalities of gait and mobility: Secondary | ICD-10-CM | POA: Diagnosis not present

## 2018-10-23 DIAGNOSIS — M6281 Muscle weakness (generalized): Secondary | ICD-10-CM | POA: Diagnosis not present

## 2018-10-23 DIAGNOSIS — M810 Age-related osteoporosis without current pathological fracture: Secondary | ICD-10-CM | POA: Diagnosis not present

## 2018-10-23 DIAGNOSIS — E0800 Diabetes mellitus due to underlying condition with hyperosmolarity without nonketotic hyperglycemic-hyperosmolar coma (NKHHC): Secondary | ICD-10-CM | POA: Diagnosis not present

## 2018-10-23 DIAGNOSIS — R278 Other lack of coordination: Secondary | ICD-10-CM | POA: Diagnosis not present

## 2018-10-23 DIAGNOSIS — M4316 Spondylolisthesis, lumbar region: Secondary | ICD-10-CM | POA: Diagnosis not present

## 2018-10-23 DIAGNOSIS — G9341 Metabolic encephalopathy: Secondary | ICD-10-CM | POA: Diagnosis not present

## 2018-10-24 DIAGNOSIS — M4316 Spondylolisthesis, lumbar region: Secondary | ICD-10-CM | POA: Diagnosis not present

## 2018-10-24 DIAGNOSIS — M6281 Muscle weakness (generalized): Secondary | ICD-10-CM | POA: Diagnosis not present

## 2018-10-24 DIAGNOSIS — R2689 Other abnormalities of gait and mobility: Secondary | ICD-10-CM | POA: Diagnosis not present

## 2018-10-24 DIAGNOSIS — G9341 Metabolic encephalopathy: Secondary | ICD-10-CM | POA: Diagnosis not present

## 2018-10-24 DIAGNOSIS — M810 Age-related osteoporosis without current pathological fracture: Secondary | ICD-10-CM | POA: Diagnosis not present

## 2018-10-24 DIAGNOSIS — R278 Other lack of coordination: Secondary | ICD-10-CM | POA: Diagnosis not present

## 2018-10-25 DIAGNOSIS — M4316 Spondylolisthesis, lumbar region: Secondary | ICD-10-CM | POA: Diagnosis not present

## 2018-10-25 DIAGNOSIS — R278 Other lack of coordination: Secondary | ICD-10-CM | POA: Diagnosis not present

## 2018-10-25 DIAGNOSIS — G9341 Metabolic encephalopathy: Secondary | ICD-10-CM | POA: Diagnosis not present

## 2018-10-25 DIAGNOSIS — R2689 Other abnormalities of gait and mobility: Secondary | ICD-10-CM | POA: Diagnosis not present

## 2018-10-25 DIAGNOSIS — M6281 Muscle weakness (generalized): Secondary | ICD-10-CM | POA: Diagnosis not present

## 2018-10-25 DIAGNOSIS — M810 Age-related osteoporosis without current pathological fracture: Secondary | ICD-10-CM | POA: Diagnosis not present

## 2018-10-26 DIAGNOSIS — F411 Generalized anxiety disorder: Secondary | ICD-10-CM | POA: Diagnosis not present

## 2018-10-26 DIAGNOSIS — M810 Age-related osteoporosis without current pathological fracture: Secondary | ICD-10-CM | POA: Diagnosis not present

## 2018-10-26 DIAGNOSIS — F0390 Unspecified dementia without behavioral disturbance: Secondary | ICD-10-CM | POA: Diagnosis not present

## 2018-10-26 DIAGNOSIS — R2689 Other abnormalities of gait and mobility: Secondary | ICD-10-CM | POA: Diagnosis not present

## 2018-10-26 DIAGNOSIS — M1990 Unspecified osteoarthritis, unspecified site: Secondary | ICD-10-CM | POA: Diagnosis not present

## 2018-10-26 DIAGNOSIS — E119 Type 2 diabetes mellitus without complications: Secondary | ICD-10-CM | POA: Diagnosis not present

## 2018-10-26 DIAGNOSIS — I639 Cerebral infarction, unspecified: Secondary | ICD-10-CM | POA: Diagnosis not present

## 2018-10-26 DIAGNOSIS — M6281 Muscle weakness (generalized): Secondary | ICD-10-CM | POA: Diagnosis not present

## 2018-10-26 DIAGNOSIS — R278 Other lack of coordination: Secondary | ICD-10-CM | POA: Diagnosis not present

## 2018-10-26 DIAGNOSIS — G9341 Metabolic encephalopathy: Secondary | ICD-10-CM | POA: Diagnosis not present

## 2018-10-26 DIAGNOSIS — M4316 Spondylolisthesis, lumbar region: Secondary | ICD-10-CM | POA: Diagnosis not present

## 2018-10-27 DIAGNOSIS — M6281 Muscle weakness (generalized): Secondary | ICD-10-CM | POA: Diagnosis not present

## 2018-10-27 DIAGNOSIS — M810 Age-related osteoporosis without current pathological fracture: Secondary | ICD-10-CM | POA: Diagnosis not present

## 2018-10-27 DIAGNOSIS — R278 Other lack of coordination: Secondary | ICD-10-CM | POA: Diagnosis not present

## 2018-10-27 DIAGNOSIS — E119 Type 2 diabetes mellitus without complications: Secondary | ICD-10-CM | POA: Diagnosis not present

## 2018-10-27 DIAGNOSIS — R4182 Altered mental status, unspecified: Secondary | ICD-10-CM | POA: Diagnosis not present

## 2018-10-27 DIAGNOSIS — I69320 Aphasia following cerebral infarction: Secondary | ICD-10-CM | POA: Diagnosis not present

## 2018-10-27 DIAGNOSIS — M4316 Spondylolisthesis, lumbar region: Secondary | ICD-10-CM | POA: Diagnosis not present

## 2018-10-27 DIAGNOSIS — G9341 Metabolic encephalopathy: Secondary | ICD-10-CM | POA: Diagnosis not present

## 2018-10-27 DIAGNOSIS — R2689 Other abnormalities of gait and mobility: Secondary | ICD-10-CM | POA: Diagnosis not present

## 2018-10-30 DIAGNOSIS — M6281 Muscle weakness (generalized): Secondary | ICD-10-CM | POA: Diagnosis not present

## 2018-10-30 DIAGNOSIS — G9341 Metabolic encephalopathy: Secondary | ICD-10-CM | POA: Diagnosis not present

## 2018-10-30 DIAGNOSIS — M810 Age-related osteoporosis without current pathological fracture: Secondary | ICD-10-CM | POA: Diagnosis not present

## 2018-10-30 DIAGNOSIS — M4316 Spondylolisthesis, lumbar region: Secondary | ICD-10-CM | POA: Diagnosis not present

## 2018-10-30 DIAGNOSIS — R278 Other lack of coordination: Secondary | ICD-10-CM | POA: Diagnosis not present

## 2018-10-30 DIAGNOSIS — R2689 Other abnormalities of gait and mobility: Secondary | ICD-10-CM | POA: Diagnosis not present

## 2018-10-31 DIAGNOSIS — M4316 Spondylolisthesis, lumbar region: Secondary | ICD-10-CM | POA: Diagnosis not present

## 2018-10-31 DIAGNOSIS — R2689 Other abnormalities of gait and mobility: Secondary | ICD-10-CM | POA: Diagnosis not present

## 2018-10-31 DIAGNOSIS — G9341 Metabolic encephalopathy: Secondary | ICD-10-CM | POA: Diagnosis not present

## 2018-10-31 DIAGNOSIS — M810 Age-related osteoporosis without current pathological fracture: Secondary | ICD-10-CM | POA: Diagnosis not present

## 2018-10-31 DIAGNOSIS — M6281 Muscle weakness (generalized): Secondary | ICD-10-CM | POA: Diagnosis not present

## 2018-10-31 DIAGNOSIS — R278 Other lack of coordination: Secondary | ICD-10-CM | POA: Diagnosis not present

## 2018-11-01 DIAGNOSIS — M810 Age-related osteoporosis without current pathological fracture: Secondary | ICD-10-CM | POA: Diagnosis not present

## 2018-11-01 DIAGNOSIS — F0151 Vascular dementia with behavioral disturbance: Secondary | ICD-10-CM | POA: Diagnosis not present

## 2018-11-01 DIAGNOSIS — F419 Anxiety disorder, unspecified: Secondary | ICD-10-CM | POA: Diagnosis not present

## 2018-11-01 DIAGNOSIS — M6281 Muscle weakness (generalized): Secondary | ICD-10-CM | POA: Diagnosis not present

## 2018-11-01 DIAGNOSIS — M4316 Spondylolisthesis, lumbar region: Secondary | ICD-10-CM | POA: Diagnosis not present

## 2018-11-01 DIAGNOSIS — R2689 Other abnormalities of gait and mobility: Secondary | ICD-10-CM | POA: Diagnosis not present

## 2018-11-01 DIAGNOSIS — R278 Other lack of coordination: Secondary | ICD-10-CM | POA: Diagnosis not present

## 2018-11-01 DIAGNOSIS — G9341 Metabolic encephalopathy: Secondary | ICD-10-CM | POA: Diagnosis not present

## 2018-11-02 DIAGNOSIS — R278 Other lack of coordination: Secondary | ICD-10-CM | POA: Diagnosis not present

## 2018-11-02 DIAGNOSIS — R2689 Other abnormalities of gait and mobility: Secondary | ICD-10-CM | POA: Diagnosis not present

## 2018-11-02 DIAGNOSIS — M4316 Spondylolisthesis, lumbar region: Secondary | ICD-10-CM | POA: Diagnosis not present

## 2018-11-02 DIAGNOSIS — M810 Age-related osteoporosis without current pathological fracture: Secondary | ICD-10-CM | POA: Diagnosis not present

## 2018-11-02 DIAGNOSIS — M6281 Muscle weakness (generalized): Secondary | ICD-10-CM | POA: Diagnosis not present

## 2018-11-02 DIAGNOSIS — G9341 Metabolic encephalopathy: Secondary | ICD-10-CM | POA: Diagnosis not present

## 2018-11-03 DIAGNOSIS — M4316 Spondylolisthesis, lumbar region: Secondary | ICD-10-CM | POA: Diagnosis not present

## 2018-11-03 DIAGNOSIS — R278 Other lack of coordination: Secondary | ICD-10-CM | POA: Diagnosis not present

## 2018-11-03 DIAGNOSIS — M6281 Muscle weakness (generalized): Secondary | ICD-10-CM | POA: Diagnosis not present

## 2018-11-03 DIAGNOSIS — M810 Age-related osteoporosis without current pathological fracture: Secondary | ICD-10-CM | POA: Diagnosis not present

## 2018-11-03 DIAGNOSIS — G9341 Metabolic encephalopathy: Secondary | ICD-10-CM | POA: Diagnosis not present

## 2018-11-03 DIAGNOSIS — R2689 Other abnormalities of gait and mobility: Secondary | ICD-10-CM | POA: Diagnosis not present

## 2018-11-06 DIAGNOSIS — M6281 Muscle weakness (generalized): Secondary | ICD-10-CM | POA: Diagnosis not present

## 2018-11-06 DIAGNOSIS — R2689 Other abnormalities of gait and mobility: Secondary | ICD-10-CM | POA: Diagnosis not present

## 2018-11-06 DIAGNOSIS — R278 Other lack of coordination: Secondary | ICD-10-CM | POA: Diagnosis not present

## 2018-11-06 DIAGNOSIS — G9341 Metabolic encephalopathy: Secondary | ICD-10-CM | POA: Diagnosis not present

## 2018-11-06 DIAGNOSIS — M4316 Spondylolisthesis, lumbar region: Secondary | ICD-10-CM | POA: Diagnosis not present

## 2018-11-06 DIAGNOSIS — M810 Age-related osteoporosis without current pathological fracture: Secondary | ICD-10-CM | POA: Diagnosis not present

## 2018-11-07 DIAGNOSIS — R278 Other lack of coordination: Secondary | ICD-10-CM | POA: Diagnosis not present

## 2018-11-07 DIAGNOSIS — M4316 Spondylolisthesis, lumbar region: Secondary | ICD-10-CM | POA: Diagnosis not present

## 2018-11-07 DIAGNOSIS — R2689 Other abnormalities of gait and mobility: Secondary | ICD-10-CM | POA: Diagnosis not present

## 2018-11-07 DIAGNOSIS — M6281 Muscle weakness (generalized): Secondary | ICD-10-CM | POA: Diagnosis not present

## 2018-11-07 DIAGNOSIS — G9341 Metabolic encephalopathy: Secondary | ICD-10-CM | POA: Diagnosis not present

## 2018-11-07 DIAGNOSIS — M810 Age-related osteoporosis without current pathological fracture: Secondary | ICD-10-CM | POA: Diagnosis not present

## 2018-11-08 DIAGNOSIS — M810 Age-related osteoporosis without current pathological fracture: Secondary | ICD-10-CM | POA: Diagnosis not present

## 2018-11-08 DIAGNOSIS — M4316 Spondylolisthesis, lumbar region: Secondary | ICD-10-CM | POA: Diagnosis not present

## 2018-11-08 DIAGNOSIS — G9341 Metabolic encephalopathy: Secondary | ICD-10-CM | POA: Diagnosis not present

## 2018-11-08 DIAGNOSIS — R278 Other lack of coordination: Secondary | ICD-10-CM | POA: Diagnosis not present

## 2018-11-08 DIAGNOSIS — R2689 Other abnormalities of gait and mobility: Secondary | ICD-10-CM | POA: Diagnosis not present

## 2018-11-08 DIAGNOSIS — M6281 Muscle weakness (generalized): Secondary | ICD-10-CM | POA: Diagnosis not present

## 2018-11-09 DIAGNOSIS — M4316 Spondylolisthesis, lumbar region: Secondary | ICD-10-CM | POA: Diagnosis not present

## 2018-11-09 DIAGNOSIS — I639 Cerebral infarction, unspecified: Secondary | ICD-10-CM | POA: Diagnosis not present

## 2018-11-09 DIAGNOSIS — R2689 Other abnormalities of gait and mobility: Secondary | ICD-10-CM | POA: Diagnosis not present

## 2018-11-09 DIAGNOSIS — E1165 Type 2 diabetes mellitus with hyperglycemia: Secondary | ICD-10-CM | POA: Diagnosis not present

## 2018-11-09 DIAGNOSIS — G9341 Metabolic encephalopathy: Secondary | ICD-10-CM | POA: Diagnosis not present

## 2018-11-09 DIAGNOSIS — R1310 Dysphagia, unspecified: Secondary | ICD-10-CM | POA: Diagnosis not present

## 2018-11-09 DIAGNOSIS — I10 Essential (primary) hypertension: Secondary | ICD-10-CM | POA: Diagnosis not present

## 2018-11-09 DIAGNOSIS — R278 Other lack of coordination: Secondary | ICD-10-CM | POA: Diagnosis not present

## 2018-11-09 DIAGNOSIS — F411 Generalized anxiety disorder: Secondary | ICD-10-CM | POA: Diagnosis not present

## 2018-11-09 DIAGNOSIS — M810 Age-related osteoporosis without current pathological fracture: Secondary | ICD-10-CM | POA: Diagnosis not present

## 2018-11-09 DIAGNOSIS — M6281 Muscle weakness (generalized): Secondary | ICD-10-CM | POA: Diagnosis not present

## 2018-11-10 DIAGNOSIS — M6281 Muscle weakness (generalized): Secondary | ICD-10-CM | POA: Diagnosis not present

## 2018-11-10 DIAGNOSIS — M4316 Spondylolisthesis, lumbar region: Secondary | ICD-10-CM | POA: Diagnosis not present

## 2018-11-10 DIAGNOSIS — R278 Other lack of coordination: Secondary | ICD-10-CM | POA: Diagnosis not present

## 2018-11-10 DIAGNOSIS — M810 Age-related osteoporosis without current pathological fracture: Secondary | ICD-10-CM | POA: Diagnosis not present

## 2018-11-10 DIAGNOSIS — R2689 Other abnormalities of gait and mobility: Secondary | ICD-10-CM | POA: Diagnosis not present

## 2018-11-10 DIAGNOSIS — G9341 Metabolic encephalopathy: Secondary | ICD-10-CM | POA: Diagnosis not present

## 2018-11-13 DIAGNOSIS — R278 Other lack of coordination: Secondary | ICD-10-CM | POA: Diagnosis not present

## 2018-11-13 DIAGNOSIS — M810 Age-related osteoporosis without current pathological fracture: Secondary | ICD-10-CM | POA: Diagnosis not present

## 2018-11-13 DIAGNOSIS — M4316 Spondylolisthesis, lumbar region: Secondary | ICD-10-CM | POA: Diagnosis not present

## 2018-11-13 DIAGNOSIS — M6281 Muscle weakness (generalized): Secondary | ICD-10-CM | POA: Diagnosis not present

## 2018-11-13 DIAGNOSIS — G9341 Metabolic encephalopathy: Secondary | ICD-10-CM | POA: Diagnosis not present

## 2018-11-13 DIAGNOSIS — R2689 Other abnormalities of gait and mobility: Secondary | ICD-10-CM | POA: Diagnosis not present

## 2018-11-14 DIAGNOSIS — R278 Other lack of coordination: Secondary | ICD-10-CM | POA: Diagnosis not present

## 2018-11-14 DIAGNOSIS — M810 Age-related osteoporosis without current pathological fracture: Secondary | ICD-10-CM | POA: Diagnosis not present

## 2018-11-14 DIAGNOSIS — R2689 Other abnormalities of gait and mobility: Secondary | ICD-10-CM | POA: Diagnosis not present

## 2018-11-14 DIAGNOSIS — M4316 Spondylolisthesis, lumbar region: Secondary | ICD-10-CM | POA: Diagnosis not present

## 2018-11-14 DIAGNOSIS — G9341 Metabolic encephalopathy: Secondary | ICD-10-CM | POA: Diagnosis not present

## 2018-11-14 DIAGNOSIS — M6281 Muscle weakness (generalized): Secondary | ICD-10-CM | POA: Diagnosis not present

## 2018-11-29 DIAGNOSIS — F39 Unspecified mood [affective] disorder: Secondary | ICD-10-CM | POA: Diagnosis not present

## 2018-11-29 DIAGNOSIS — F419 Anxiety disorder, unspecified: Secondary | ICD-10-CM | POA: Diagnosis not present

## 2018-11-29 DIAGNOSIS — F0151 Vascular dementia with behavioral disturbance: Secondary | ICD-10-CM | POA: Diagnosis not present

## 2018-12-07 DIAGNOSIS — I639 Cerebral infarction, unspecified: Secondary | ICD-10-CM | POA: Diagnosis not present

## 2018-12-07 DIAGNOSIS — F411 Generalized anxiety disorder: Secondary | ICD-10-CM | POA: Diagnosis not present

## 2018-12-07 DIAGNOSIS — R4701 Aphasia: Secondary | ICD-10-CM | POA: Diagnosis not present

## 2018-12-07 DIAGNOSIS — R1310 Dysphagia, unspecified: Secondary | ICD-10-CM | POA: Diagnosis not present

## 2018-12-07 DIAGNOSIS — F0391 Unspecified dementia with behavioral disturbance: Secondary | ICD-10-CM | POA: Diagnosis not present

## 2018-12-21 DIAGNOSIS — E0800 Diabetes mellitus due to underlying condition with hyperosmolarity without nonketotic hyperglycemic-hyperosmolar coma (NKHHC): Secondary | ICD-10-CM | POA: Diagnosis not present

## 2019-02-24 ENCOUNTER — Emergency Department (HOSPITAL_COMMUNITY)
Admission: EM | Admit: 2019-02-24 | Discharge: 2019-02-24 | Disposition: A | Discharge status: Home / Self Care | Payer: Medicare Other | Attending: Emergency Medicine | Admitting: Emergency Medicine

## 2019-02-24 ENCOUNTER — Encounter (HOSPITAL_COMMUNITY): Payer: Self-pay | Admitting: Emergency Medicine

## 2019-02-24 ENCOUNTER — Emergency Department (HOSPITAL_COMMUNITY): Payer: Medicare Other

## 2019-02-24 ENCOUNTER — Other Ambulatory Visit: Payer: Self-pay

## 2019-02-24 DIAGNOSIS — S0990XA Unspecified injury of head, initial encounter: Secondary | ICD-10-CM | POA: Diagnosis present

## 2019-02-24 DIAGNOSIS — W19XXXA Unspecified fall, initial encounter: Secondary | ICD-10-CM | POA: Diagnosis not present

## 2019-02-24 DIAGNOSIS — E1140 Type 2 diabetes mellitus with diabetic neuropathy, unspecified: Secondary | ICD-10-CM | POA: Insufficient documentation

## 2019-02-24 DIAGNOSIS — F0390 Unspecified dementia without behavioral disturbance: Secondary | ICD-10-CM | POA: Insufficient documentation

## 2019-02-24 DIAGNOSIS — Z79899 Other long term (current) drug therapy: Secondary | ICD-10-CM | POA: Insufficient documentation

## 2019-02-24 DIAGNOSIS — Y998 Other external cause status: Secondary | ICD-10-CM | POA: Diagnosis not present

## 2019-02-24 DIAGNOSIS — Z794 Long term (current) use of insulin: Secondary | ICD-10-CM | POA: Diagnosis not present

## 2019-02-24 DIAGNOSIS — E785 Hyperlipidemia, unspecified: Secondary | ICD-10-CM | POA: Diagnosis not present

## 2019-02-24 DIAGNOSIS — Y939 Activity, unspecified: Secondary | ICD-10-CM | POA: Diagnosis not present

## 2019-02-24 DIAGNOSIS — Z8673 Personal history of transient ischemic attack (TIA), and cerebral infarction without residual deficits: Secondary | ICD-10-CM | POA: Insufficient documentation

## 2019-02-24 DIAGNOSIS — S0003XA Contusion of scalp, initial encounter: Secondary | ICD-10-CM | POA: Diagnosis not present

## 2019-02-24 DIAGNOSIS — Z87891 Personal history of nicotine dependence: Secondary | ICD-10-CM | POA: Diagnosis not present

## 2019-02-24 DIAGNOSIS — Y92129 Unspecified place in nursing home as the place of occurrence of the external cause: Secondary | ICD-10-CM | POA: Insufficient documentation

## 2019-02-24 LAB — COMPREHENSIVE METABOLIC PANEL
ALT: 11 U/L (ref 0–44)
ANION GAP: 10 (ref 5–15)
AST: 23 U/L (ref 15–41)
Albumin: 3.3 g/dL — ABNORMAL LOW (ref 3.5–5.0)
Alkaline Phosphatase: 93 U/L (ref 38–126)
BUN: 14 mg/dL (ref 8–23)
CHLORIDE: 103 mmol/L (ref 98–111)
CO2: 27 mmol/L (ref 22–32)
Calcium: 9.3 mg/dL (ref 8.9–10.3)
Creatinine, Ser: 0.92 mg/dL (ref 0.44–1.00)
Glucose, Bld: 231 mg/dL — ABNORMAL HIGH (ref 70–99)
Potassium: 4.6 mmol/L (ref 3.5–5.1)
Sodium: 140 mmol/L (ref 135–145)
TOTAL PROTEIN: 6.2 g/dL — AB (ref 6.5–8.1)
Total Bilirubin: 0.6 mg/dL (ref 0.3–1.2)

## 2019-02-24 LAB — CBC
HCT: 40.2 % (ref 36.0–46.0)
HEMOGLOBIN: 13.8 g/dL (ref 12.0–15.0)
MCH: 30.9 pg (ref 26.0–34.0)
MCHC: 34.3 g/dL (ref 30.0–36.0)
MCV: 90.1 fL (ref 80.0–100.0)
NRBC: 0 % (ref 0.0–0.2)
Platelets: 235 10*3/uL (ref 150–400)
RBC: 4.46 MIL/uL (ref 3.87–5.11)
RDW: 12.1 % (ref 11.5–15.5)
WBC: 5.3 10*3/uL (ref 4.0–10.5)

## 2019-02-24 LAB — CBG MONITORING, ED: Glucose-Capillary: 221 mg/dL — ABNORMAL HIGH (ref 70–99)

## 2019-02-24 NOTE — Discharge Instructions (Addendum)
Sheila [name] had a CT scan of her head and neck today.  Please have her follow up with her family doctor for recheck.

## 2019-02-24 NOTE — ED Triage Notes (Signed)
Per EMS  Pt from [facility] with complaints of fall.  Pt does have dementia at baseline and is currently at baseline and is independent but was found around 1100 on the floor.  Pt has a medium hemotoma above right eyes and small abrasion to right shoulder.  Pt is in C collar.  Pt is following commands but not answering questions.

## 2019-02-24 NOTE — ED Notes (Signed)
Pt placed on on purewick

## 2019-02-24 NOTE — ED Notes (Signed)
Called ptar @2 :18

## 2019-02-24 NOTE — ED Provider Notes (Signed)
[facility] EMERGENCY DEPARTMENT Provider Note   CSN: [id number] Arrival date & time: 02/24/19  1211    History   Chief Complaint Chief Complaint  Patient presents with  . Fall    HPI Sheila [name] is a 68 y.o. female.     The history is provided by the EMS personnel and medical records. No language interpreter was used.  Fall    Sheila [name] is a 68 y.o. female who presents to the Emergency Department complaining of fall.  Level V caveat due to dementia. Hx is provided by EMS.  Pt is a resident of [facility] for evaluation following an unwitnessed fall . She was found on the floor at 1100.  Patient denies any complaints or recent illness, unsure of events earlier today.   Past Medical History:  Diagnosis Date  . Anxiety   . Aphasia   . Arthritis   . Diabetes mellitus    complicated by peripheral neuropathy  . Dysphagia following nontraumatic intracerebral hemorrhage   . Hyperlipidemia   . Muscle weakness (generalized)   . Stroke [facility]) 2015   residual right foot drop    Patient Active Problem List   Diagnosis Date Noted  . Multi-infarct dementia without behavioral disturbance ([place]) 06/15/2018  . Altered mental status 05/29/2018  . Lower extremity pain, right 05/29/2018  . Aphasia following ICH Jan 2019 05/29/2018  . Dysphagia following nontraumatic intracerebral hemorrhage 05/29/2018  . Anxiety 05/29/2018  . Encephalopathy acute 05/26/2018  . Intraventricular hemorrhage ([place]) 01/05/2018  . Lethargic 05/05/2017  . Nail abnormalities 12/31/2016  . Memory changes 09/16/2016  . Multiple falls 09/16/2016  . UTI (urinary tract infection) 02/28/2016  . Depression 02/28/2016  . Acute ischemic VBA thalamic stroke ([place])   . Stroke (cerebrum) ([place])   . Anomia 02/17/2016  . Stroke ([place]) 02/17/2016  . Peripheral arterial disease ([place]) 12/03/2015  . Medicare annual wellness visit, subsequent 03/12/2015  . Routine general medical examination  at a health care facility 10/08/2014  . Low back pain potentially associated with radiculopathy 03/01/2013  . Hyperlipidemia 02/15/2012  . Type 2 diabetes mellitus with neurologic complication ([place]) 51/70/0174    Past Surgical History:  Procedure Laterality Date  . CHOLECYSTECTOMY    . TONSILLECTOMY       OB History   No obstetric history on file.      Home Medications    Prior to Admission medications   Medication Sig Start Date End Date Taking? Authorizing Provider  acetaminophen (TYLENOL) 325 MG tablet Take 650 mg by mouth every 4 (four) hours as needed for mild pain, moderate pain or headache.    [provider]  Amantadine HCl 100 MG tablet Take 50 mg by mouth 2 (two) times daily.    [provider]  atorvastatin (LIPITOR) 20 MG tablet Take 1 tablet (20 mg total) by mouth daily at 6 PM. Patient taking differently: Take 20 mg by mouth daily.  01/10/18   Mary Sella, NP  divalproex (DEPAKOTE SPRINKLE) 125 MG capsule Take 1 capsule (125 mg total) by mouth every 8 (eight) hours. 06/01/18   Ghimire, Henreitta Leber, MD  fish oil-omega-3 fatty acids 1000 MG capsule Take 1 g by mouth daily.    [provider]  gabapentin (NEURONTIN) 100 MG capsule Take 1 capsule (100 mg total) by mouth at bedtime. 06/01/18   Ghimire, Henreitta Leber, MD  insulin aspart (NOVOLOG) 100 UNIT/ML injection 0-9 Units, Subcutaneous, 3 times daily with meals,  CBG < 70: implement hypoglycemia protocol CBG 70 - 120: 0 units CBG 121 - 150: 1 unit CBG 151 - 200: 2 units CBG 201 - 250: 3 units CBG 251 - 300: 5 units CBG 301 - 350: 7 units CBG 351 - 400: 9 units CBG > 400: call MD and obtain STAT lab verification 06/01/18   Jonetta Osgood, MD  LORazepam (ATIVAN) 0.5 MG tablet Take 1 tablet (0.5 mg total) by mouth at bedtime. 06/01/18   Ghimire, Henreitta Leber, MD  magnesium oxide (MAG-OX) 400 (241.3 Mg) MG tablet Take 1 tablet (400 mg total) by mouth daily. 01/10/18   Mary Sella, NP  Multiple  Vitamin (MULTIVITAMIN WITH MINERALS) TABS tablet Take 1 tablet by mouth daily.    [provider]  potassium chloride (K-DUR) 10 MEQ tablet Take 1 tablet (10 mEq total) by mouth daily. 01/10/18   Mary Sella, NP    Family History Family History  Problem Relation Age of Onset  . Diabetes Mother        Deceased  . Diabetes Brother   . Healthy Son     Social History Social History   Tobacco Use  . Smoking status: Former Smoker    Types: Cigarettes    Last attempt to quit: 02/15/1992    Years since quitting: 27.0  . Smokeless tobacco: Never Used  Substance Use Topics  . Alcohol use: No    Alcohol/week: 0.0 standard drinks    Comment: Socially  . Drug use: No    Types: Marijuana    Comment: SMOKES IT ANY TIME SHE CAN GET IT     Allergies   Sulfa antibiotics; Paxil [paroxetine hcl]; and Iopamidol   Review of Systems Review of Systems  All other systems reviewed and are negative.    Physical Exam Updated Vital Signs BP 127/67 (BP Location: Right Arm)   Pulse 80   Temp 98 F (36.7 C) (Oral)   Resp (!) 25   Ht 5\' 6"  (1.676 m)   Wt 77.1 kg   SpO2 100%   BMI 27.44 kg/m   Physical Exam Vitals signs and nursing note reviewed.  Constitutional:      Appearance: She is well-developed.  HENT:     Head: Normocephalic.     Comments: Hematoma to right forehead.   Cardiovascular:     Rate and Rhythm: Normal rate and regular rhythm.     Heart sounds: No murmur.  Pulmonary:     Effort: Pulmonary effort is normal. No respiratory distress.     Breath sounds: Normal breath sounds.  Abdominal:     Palpations: Abdomen is soft.     Tenderness: There is no abdominal tenderness. There is no guarding or rebound.  Musculoskeletal:        General: No tenderness.     Comments: No tenderness to shoulders, hips bilaterally  Skin:    General: Skin is warm and dry.  Neurological:     Mental Status: She is alert.     Comments: Alert, oriented to person and place.   Disoriented to time and recent events.  4/5 strength in all four extremities.    Psychiatric:        Behavior: Behavior normal.      ED Treatments / Results  Labs (all labs ordered are listed, but only abnormal results are displayed) Labs Reviewed  CBG MONITORING, ED - Abnormal; Notable for the following components:      Result Value   Glucose-Capillary  221 (*)    All other components within normal limits  COMPREHENSIVE METABOLIC PANEL  CBC  URINALYSIS, ROUTINE W REFLEX MICROSCOPIC    EKG EKG Interpretation  Date/Time:  Saturday February 24 2019 12:29:01 EST Ventricular Rate:  76 PR Interval:    QRS Duration: 90 QT Interval:  372 QTC Calculation: 419 R Axis:   19 Text Interpretation:  Sinus rhythm Low voltage, precordial leads Confirmed by Quintella Reichert [phone]) on 02/24/2019 1:01:08 PM   Radiology No results found.  Procedures Procedures (including critical care time)  Medications Ordered in ED Medications - No data to display   Initial Impression / Assessment and Plan / ED Course  I have reviewed the triage vital signs and the nursing notes.  Pertinent labs & imaging results that were available during my care of the patient were reviewed by me and considered in my medical decision making (see chart for details).        Pt here for evaluation of injuries following an unwitnessed fall that occurred earlier today. She does have a head contusion on examination but no acute complaints. Attempted to contact [facility] with no response. Discussed with patient's son current exam, CT and lab findings. Son reports no recent illnesses or complaints. Plan to discharge back to facility with outpatient follow-up and return precautions.  CT scan with spinal stenosis.  Pt with 4/5 strength in all four extremities - on record review appears at baseline.  Pt currently asymptomatic.     Final Clinical Impressions(s) / ED Diagnoses   Final diagnoses:  Fall, initial encounter   Contusion of scalp, initial encounter    ED Discharge Orders    None       Quintella Reichert, MD 02/24/19 1526

## 2019-06-25 IMAGING — MR MR HEAD W/O CM
10 of 11 series · 43 of 48 positions shown · non-contrast
Comparison: Prior CT from earlier the same day as well as previous
MRI from 01/05/2018.

CLINICAL DATA: Initial evaluation for acute weakness, history of
prior CVA with left-sided deficits.

EXAM:
MRI HEAD WITHOUT CONTRAST
TECHNIQUE: Multiplanar, multiecho pulse sequences of the brain and surrounding
structures were obtained without intravenous contrast.

[Series 5: DWI · axial · 4.0mm · 0.92mm/px · z∈[-89,+44]mm · 8 of 72 slices shown (1 of 4)]
[im 1/72]
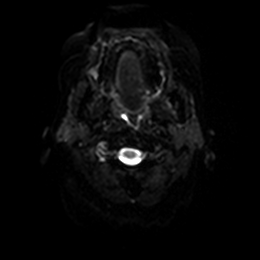
[im 11/72]
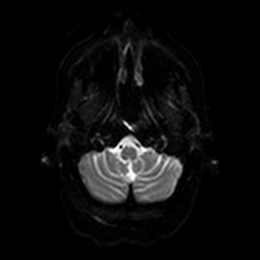
[im 21/72]
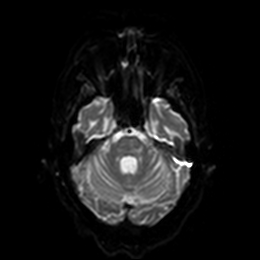
[im 31/72]
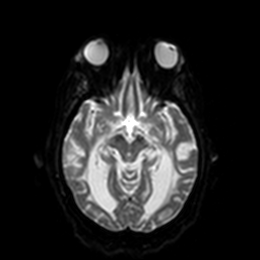
[im 41/72]
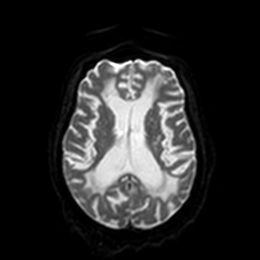
[im 51/72]
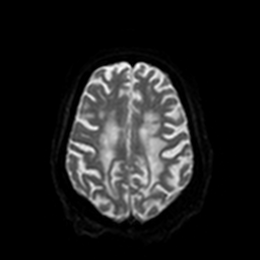
[im 61/72]
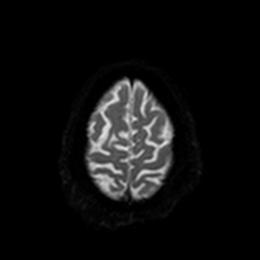
[im 72/72]
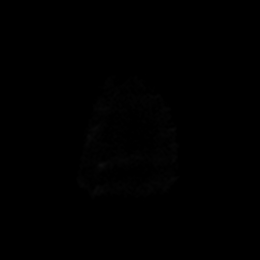

[Series 6: DWI · axial · 4.0mm · 0.92mm/px · z∈[-89,+44]mm · 4 of 36 slices shown (2 of 4)]
[im 1/36]
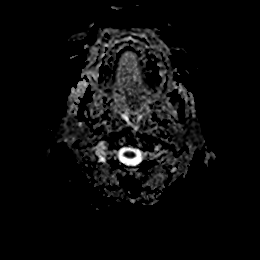
[im 12/36]
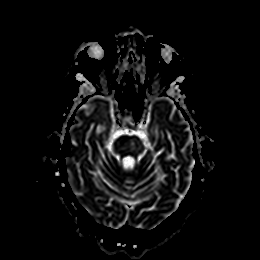
[im 24/36]
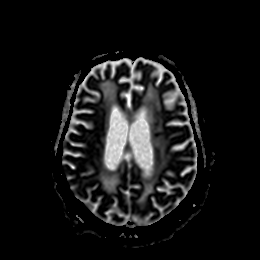
[im 36/36]
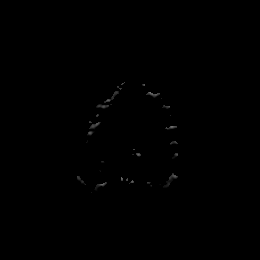

[Series 7: T2 · axial · 5.0mm · 0.75mm/px · z∈[-95,+41]mm · 3 of 25 slices shown (1 of 2)]
[im 1/25]
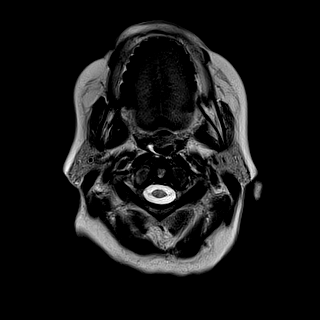
[im 13/25]
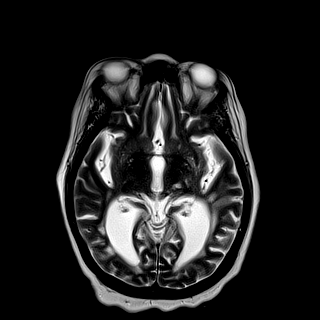
[im 25/25]
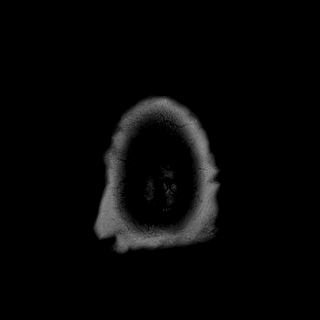

[Series 8: FLAIR · axial · 5.0mm · 0.47mm/px · z∈[-95,+41]mm · 3 of 25 slices shown]
[im 1/25]
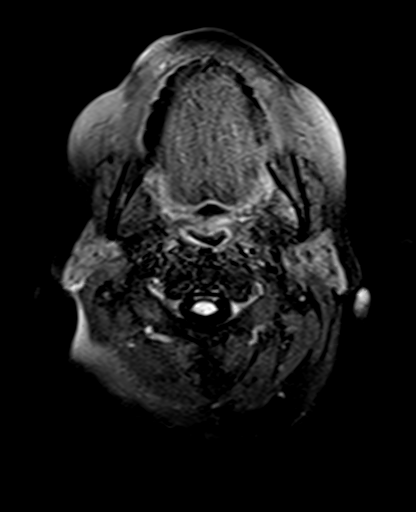
[im 13/25]
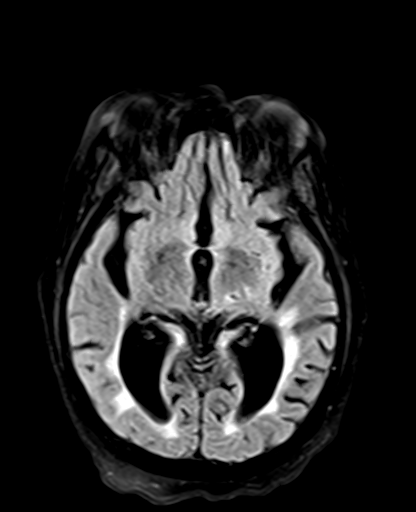
[im 25/25]
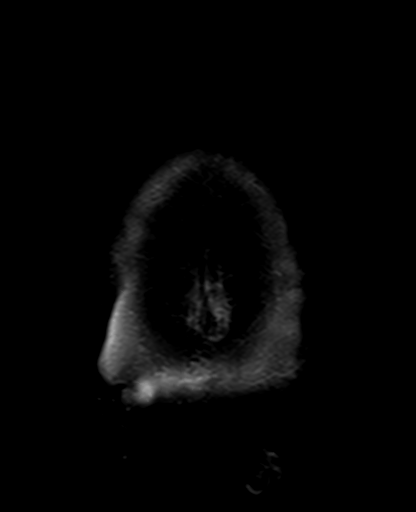

[Series 9: swi_images · axial · 3.0mm · 0.94mm/px · z∈[-92,+41]mm · 5 of 48 slices shown]
[im 1/48]
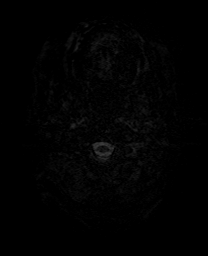
[im 12/48]
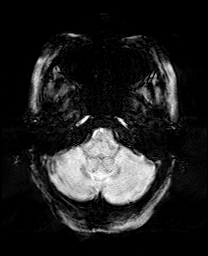
[im 24/48]
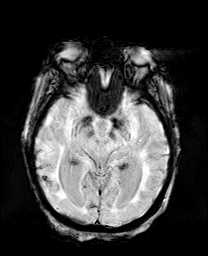
[im 36/48]
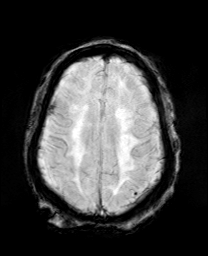
[im 48/48]
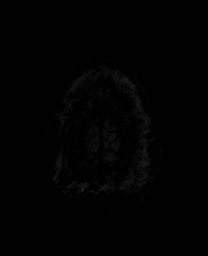

[Series 10: mip_images(sw) · axial · 24.0mm · 0.94mm/px · z∈[-82,+32]mm · 4 of 41 slices shown]
[im 1/41]
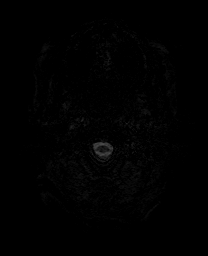
[im 14/41]
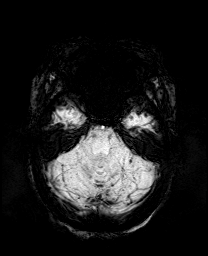
[im 27/41]
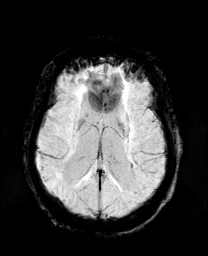
[im 41/41]
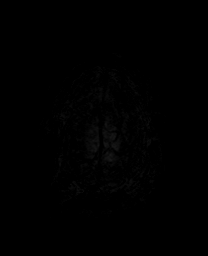

[Series 11: DWI · coronal · 4.0mm · 0.88mm/px · 7 of 64 slices shown (3 of 4)]
[im 1/64]
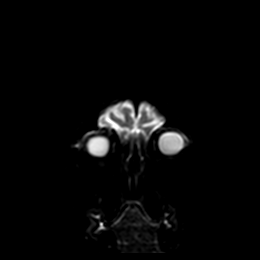
[im 11/64]
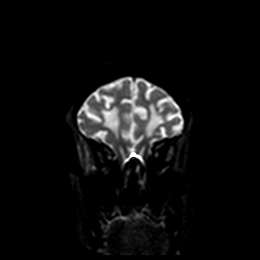
[im 22/64]
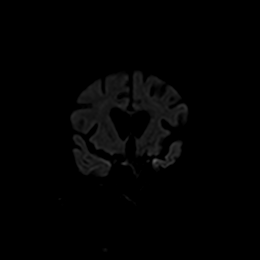
[im 32/64]
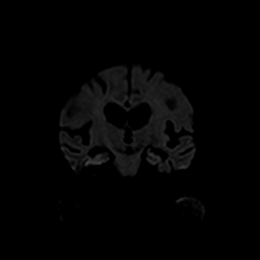
[im 43/64]
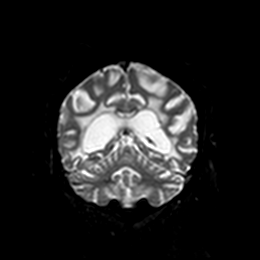
[im 53/64]
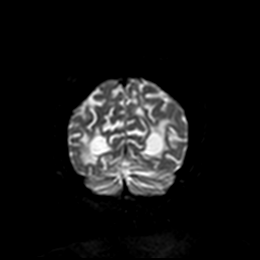
[im 64/64]
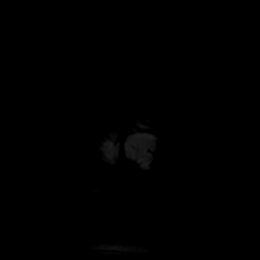

[Series 12: DWI · coronal · 4.0mm · 0.88mm/px · 3 of 32 slices shown (4 of 4)]
[im 1/32]
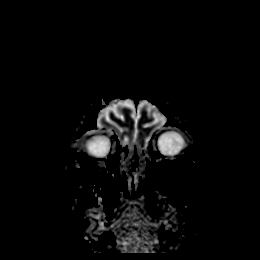
[im 16/32]
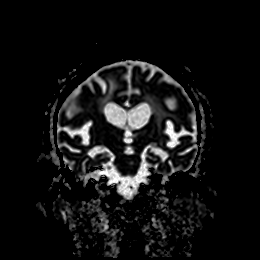
[im 32/32]
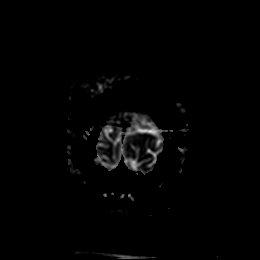

[Series 13: T1 · sagittal · 5.0mm · 0.75mm/px · 3 of 23 slices shown]
[im 1/23]
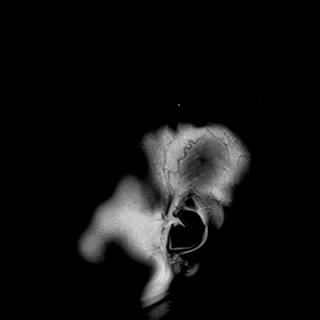
[im 12/23]
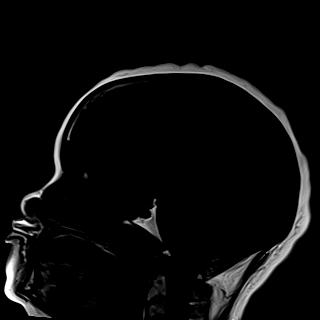
[im 23/23]
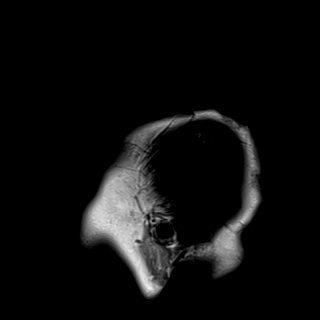

[Series 15: T2 · coronal · 5.0mm · 0.34mm/px · 3 of 27 slices shown (2 of 2)]
[im 1/27]
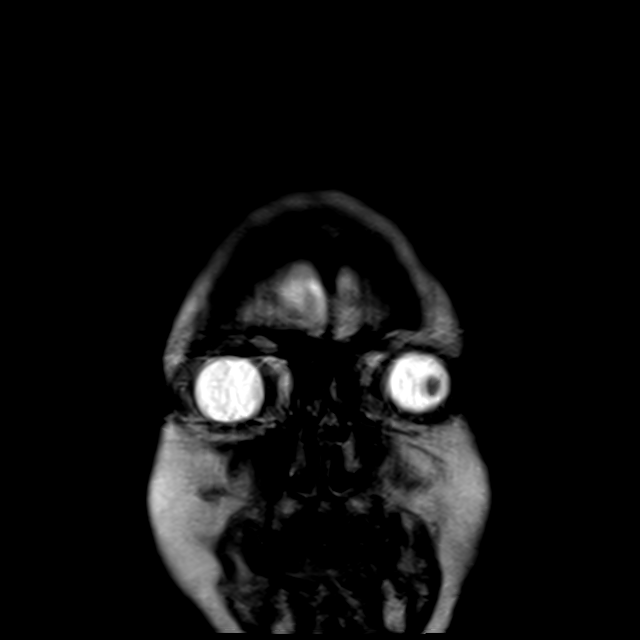
[im 14/27]
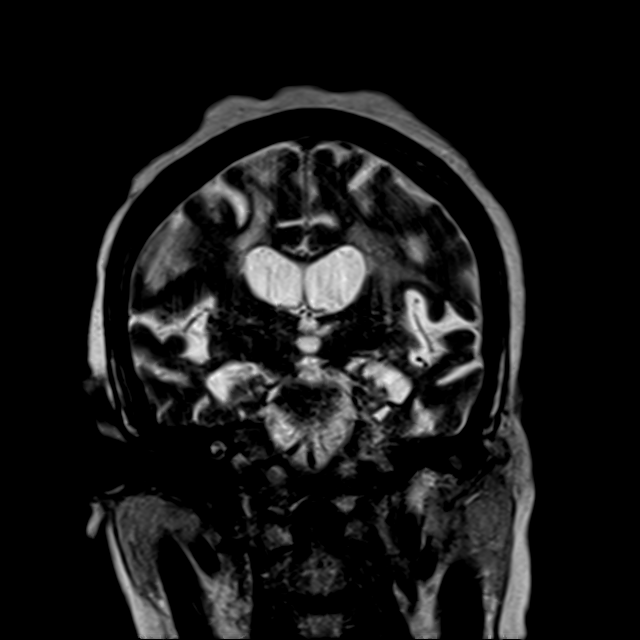
[im 27/27]
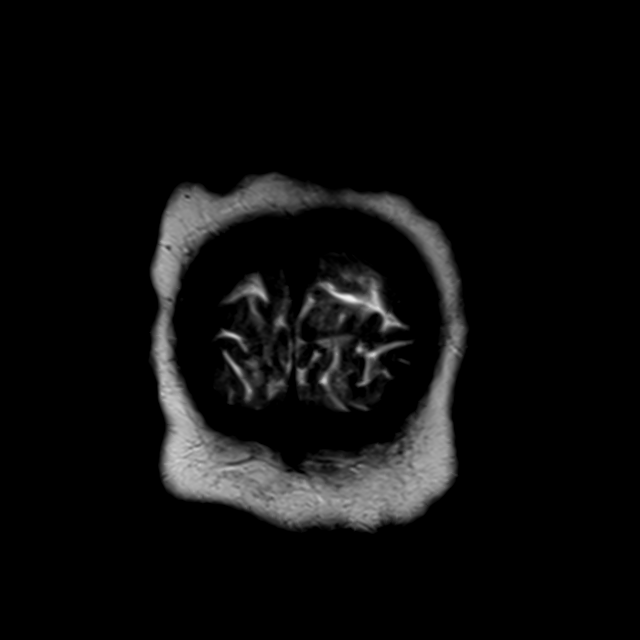

[43 of 48 positions shown; findings below may reference images not displayed]

FINDINGS: Brain: Advanced cerebral atrophy with chronic small vessel ischemic
disease, stable from previous. Remote lacunar infarcts present
within the left basal ganglia, left thalamus, and left pons. Few
probable tiny remote infarcts noted within the bilateral cerebellar
hemispheres.

No abnormal foci of restricted diffusion to suggest acute ischemic
infarct. Gray-white matter differentiation maintained. No evidence
for acute intracranial hemorrhage. Chronic hemosiderin staining seen
along the ventricular appended my of the lateral ventricles,
consistent with prior intraventricular hemorrhage. Scattered
susceptibility artifacts seen involving the bilateral temporal
occipital regions also consistent with prior hemorrhage.

No mass lesion or midline shift. Diffuse ventricular prominence,
like related global parenchymal volume loss of hydrocephalus,
stable. No extra-axial fluid collection. Major dural sinuses patent.

Normal pituitary gland.  Corpus callosum is thinned and atrophic.

Vascular: Major intracranial vascular flow voids maintained.

Skull and upper cervical spine: Craniocervical junction normal.
Upper cervical spine within normal limits. Bone marrow signal
intensity within normal limits. No scalp soft tissue abnormality.

Sinuses/Orbits: Globes and orbital soft tissues demonstrate no acute
finding. Paranasal sinuses clear. Small left mastoid effusion noted,
of doubtful significance. Inner ear structures normal.

Other: None.
IMPRESSION: 1. No acute intracranial abnormality.
2. Advanced cerebral atrophy with chronic small vessel ischemic
disease with remote lacunar infarcts involving the left basal
ganglia, left thalamus, and left paramedian pons.

## 2019-12-01 ENCOUNTER — Other Ambulatory Visit: Payer: Self-pay

## 2019-12-01 ENCOUNTER — Inpatient Hospital Stay (HOSPITAL_COMMUNITY)
Admission: EM | Admit: 2019-12-01 | Discharge: 2019-12-28 | DRG: 871 | Disposition: E | Payer: Medicare Other | Source: Skilled Nursing Facility | Attending: Internal Medicine | Admitting: Internal Medicine

## 2019-12-01 ENCOUNTER — Emergency Department (HOSPITAL_COMMUNITY): Payer: Medicare Other

## 2019-12-01 DIAGNOSIS — J9601 Acute respiratory failure with hypoxia: Secondary | ICD-10-CM | POA: Diagnosis present

## 2019-12-01 DIAGNOSIS — I69351 Hemiplegia and hemiparesis following cerebral infarction affecting right dominant side: Secondary | ICD-10-CM | POA: Diagnosis not present

## 2019-12-01 DIAGNOSIS — N179 Acute kidney failure, unspecified: Secondary | ICD-10-CM | POA: Diagnosis present

## 2019-12-01 DIAGNOSIS — R627 Adult failure to thrive: Secondary | ICD-10-CM

## 2019-12-01 DIAGNOSIS — R531 Weakness: Secondary | ICD-10-CM

## 2019-12-01 DIAGNOSIS — R0603 Acute respiratory distress: Secondary | ICD-10-CM | POA: Diagnosis not present

## 2019-12-01 DIAGNOSIS — G9340 Encephalopathy, unspecified: Secondary | ICD-10-CM | POA: Diagnosis not present

## 2019-12-01 DIAGNOSIS — D638 Anemia in other chronic diseases classified elsewhere: Secondary | ICD-10-CM | POA: Diagnosis present

## 2019-12-01 DIAGNOSIS — E43 Unspecified severe protein-calorie malnutrition: Secondary | ICD-10-CM | POA: Diagnosis present

## 2019-12-01 DIAGNOSIS — E1149 Type 2 diabetes mellitus with other diabetic neurological complication: Secondary | ICD-10-CM | POA: Diagnosis present

## 2019-12-01 DIAGNOSIS — Y95 Nosocomial condition: Secondary | ICD-10-CM | POA: Diagnosis present

## 2019-12-01 DIAGNOSIS — Z66 Do not resuscitate: Secondary | ICD-10-CM | POA: Diagnosis not present

## 2019-12-01 DIAGNOSIS — Z681 Body mass index (BMI) 19 or less, adult: Secondary | ICD-10-CM | POA: Diagnosis not present

## 2019-12-01 DIAGNOSIS — E870 Hyperosmolality and hypernatremia: Secondary | ICD-10-CM | POA: Diagnosis present

## 2019-12-01 DIAGNOSIS — A419 Sepsis, unspecified organism: Secondary | ICD-10-CM | POA: Diagnosis present

## 2019-12-01 DIAGNOSIS — Z882 Allergy status to sulfonamides status: Secondary | ICD-10-CM

## 2019-12-01 DIAGNOSIS — R0902 Hypoxemia: Secondary | ICD-10-CM | POA: Diagnosis present

## 2019-12-01 DIAGNOSIS — Z20828 Contact with and (suspected) exposure to other viral communicable diseases: Secondary | ICD-10-CM | POA: Diagnosis present

## 2019-12-01 DIAGNOSIS — Z515 Encounter for palliative care: Secondary | ICD-10-CM | POA: Diagnosis not present

## 2019-12-01 DIAGNOSIS — Z794 Long term (current) use of insulin: Secondary | ICD-10-CM

## 2019-12-01 DIAGNOSIS — E860 Dehydration: Secondary | ICD-10-CM

## 2019-12-01 DIAGNOSIS — M1990 Unspecified osteoarthritis, unspecified site: Secondary | ICD-10-CM | POA: Diagnosis present

## 2019-12-01 DIAGNOSIS — F0150 Vascular dementia without behavioral disturbance: Secondary | ICD-10-CM | POA: Diagnosis present

## 2019-12-01 DIAGNOSIS — E1142 Type 2 diabetes mellitus with diabetic polyneuropathy: Secondary | ICD-10-CM | POA: Diagnosis present

## 2019-12-01 DIAGNOSIS — R0602 Shortness of breath: Secondary | ICD-10-CM

## 2019-12-01 DIAGNOSIS — Z7401 Bed confinement status: Secondary | ICD-10-CM

## 2019-12-01 DIAGNOSIS — E872 Acidosis: Secondary | ICD-10-CM | POA: Diagnosis present

## 2019-12-01 DIAGNOSIS — G9341 Metabolic encephalopathy: Secondary | ICD-10-CM | POA: Diagnosis present

## 2019-12-01 DIAGNOSIS — L8990 Pressure ulcer of unspecified site, unspecified stage: Secondary | ICD-10-CM | POA: Insufficient documentation

## 2019-12-01 DIAGNOSIS — D72829 Elevated white blood cell count, unspecified: Secondary | ICD-10-CM | POA: Diagnosis present

## 2019-12-01 DIAGNOSIS — R64 Cachexia: Secondary | ICD-10-CM | POA: Diagnosis present

## 2019-12-01 DIAGNOSIS — J690 Pneumonitis due to inhalation of food and vomit: Secondary | ICD-10-CM

## 2019-12-01 DIAGNOSIS — I69320 Aphasia following cerebral infarction: Secondary | ICD-10-CM | POA: Diagnosis not present

## 2019-12-01 DIAGNOSIS — Z888 Allergy status to other drugs, medicaments and biological substances status: Secondary | ICD-10-CM

## 2019-12-01 DIAGNOSIS — Z87891 Personal history of nicotine dependence: Secondary | ICD-10-CM

## 2019-12-01 DIAGNOSIS — L89154 Pressure ulcer of sacral region, stage 4: Secondary | ICD-10-CM | POA: Diagnosis present

## 2019-12-01 DIAGNOSIS — E785 Hyperlipidemia, unspecified: Secondary | ICD-10-CM | POA: Diagnosis present

## 2019-12-01 DIAGNOSIS — I69311 Memory deficit following cerebral infarction: Secondary | ICD-10-CM

## 2019-12-01 DIAGNOSIS — R1310 Dysphagia, unspecified: Secondary | ICD-10-CM | POA: Diagnosis not present

## 2019-12-01 DIAGNOSIS — I69191 Dysphagia following nontraumatic intracerebral hemorrhage: Secondary | ICD-10-CM

## 2019-12-01 DIAGNOSIS — I69398 Other sequelae of cerebral infarction: Secondary | ICD-10-CM

## 2019-12-01 DIAGNOSIS — Z833 Family history of diabetes mellitus: Secondary | ICD-10-CM

## 2019-12-01 DIAGNOSIS — R6521 Severe sepsis with septic shock: Secondary | ICD-10-CM | POA: Diagnosis present

## 2019-12-01 DIAGNOSIS — M21371 Foot drop, right foot: Secondary | ICD-10-CM | POA: Diagnosis present

## 2019-12-01 LAB — COMPREHENSIVE METABOLIC PANEL
ALT: 9 U/L (ref 0–44)
AST: 22 U/L (ref 15–41)
Albumin: 1.8 g/dL — ABNORMAL LOW (ref 3.5–5.0)
Alkaline Phosphatase: 80 U/L (ref 38–126)
Anion gap: 11 (ref 5–15)
BUN: 31 mg/dL — ABNORMAL HIGH (ref 8–23)
CO2: 23 mmol/L (ref 22–32)
Calcium: 8.7 mg/dL — ABNORMAL LOW (ref 8.9–10.3)
Chloride: 116 mmol/L — ABNORMAL HIGH (ref 98–111)
Creatinine, Ser: 0.91 mg/dL (ref 0.44–1.00)
GFR calc Af Amer: >60 mL/min (ref >60)
GFR calc non Af Amer: >60 mL/min (ref >60)
Glucose, Bld: 171 mg/dL — ABNORMAL HIGH (ref 70–99)
Potassium: 4.5 mmol/L (ref 3.5–5.1)
Sodium: 150 mmol/L — ABNORMAL HIGH (ref 135–145)
Total Bilirubin: 1.2 mg/dL (ref 0.3–1.2)
Total Protein: 6.3 g/dL — ABNORMAL LOW (ref 6.5–8.1)

## 2019-12-01 LAB — CBC
HCT: 24.2 % — ABNORMAL LOW (ref 36.0–46.0)
HCT: 24.9 % — ABNORMAL LOW (ref 36.0–46.0)
Hemoglobin: 7.7 g/dL — ABNORMAL LOW (ref 12.0–15.0)
Hemoglobin: 7.7 g/dL — ABNORMAL LOW (ref 12.0–15.0)
MCH: 29.2 pg (ref 26.0–34.0)
MCH: 29.5 pg (ref 26.0–34.0)
MCHC: 30.9 g/dL (ref 30.0–36.0)
MCHC: 31.8 g/dL (ref 30.0–36.0)
MCV: 92.7 fL (ref 80.0–100.0)
MCV: 94.3 fL (ref 80.0–100.0)
Platelets: 471 10*3/uL — ABNORMAL HIGH (ref 150–400)
Platelets: 528 10*3/uL — ABNORMAL HIGH (ref 150–400)
RBC: 2.61 MIL/uL — ABNORMAL LOW (ref 3.87–5.11)
RBC: 2.64 MIL/uL — ABNORMAL LOW (ref 3.87–5.11)
RDW: 18.2 % — ABNORMAL HIGH (ref 11.5–15.5)
RDW: 18.3 % — ABNORMAL HIGH (ref 11.5–15.5)
WBC: 13.2 10*3/uL — ABNORMAL HIGH (ref 4.0–10.5)
WBC: 26 10*3/uL — ABNORMAL HIGH (ref 4.0–10.5)
nRBC: 0 % (ref 0.0–0.2)
nRBC: 0 % (ref 0.0–0.2)

## 2019-12-01 LAB — GLUCOSE, CAPILLARY: Glucose-Capillary: 158 mg/dL — ABNORMAL HIGH (ref 70–99)

## 2019-12-01 LAB — LACTIC ACID, PLASMA: Lactic Acid, Venous: 1.5 mmol/L (ref 0.5–1.9)

## 2019-12-01 LAB — HEMOGLOBIN A1C
Hgb A1c MFr Bld: 4.6 % — ABNORMAL LOW (ref 4.8–5.6)
Mean Plasma Glucose: 85.32 mg/dL

## 2019-12-01 LAB — PREPARE RBC (CROSSMATCH)

## 2019-12-01 LAB — HIV ANTIBODY (ROUTINE TESTING W REFLEX): HIV Screen 4th Generation wRfx: NONREACTIVE

## 2019-12-01 LAB — SARS CORONAVIRUS 2 (TAT 6-24 HRS): SARS Coronavirus 2: NEGATIVE

## 2019-12-01 MED ORDER — ENOXAPARIN SODIUM 40 MG/0.4ML ~~LOC~~ SOLN
40.0000 mg | SUBCUTANEOUS | Status: DC
  Administered 2019-12-01: 40 mg via SUBCUTANEOUS
  Filled 2019-12-01: qty 0.4

## 2019-12-01 MED ORDER — SODIUM CHLORIDE 0.9 % IV SOLN
INTRAVENOUS | Status: DC
  Administered 2019-12-01 – 2019-12-02 (×3): via INTRAVENOUS

## 2019-12-01 MED ORDER — ONDANSETRON 4 MG PO TBDP: 4.0000 mg | ORAL_TABLET | Freq: Three times a day (TID) | ORAL | Status: DC | PRN

## 2019-12-01 MED ORDER — ONDANSETRON HCL 4 MG/2ML IJ SOLN: 4.0000 mg | Freq: Four times a day (QID) | INTRAMUSCULAR | Status: DC | PRN

## 2019-12-01 MED ORDER — BACLOFEN 10 MG PO TABS
5.0000 mg | ORAL_TABLET | Freq: Three times a day (TID) | ORAL | Status: DC
  Filled 2019-12-01 (×2): qty 1

## 2019-12-01 MED ORDER — ONDANSETRON HCL 4 MG PO TABS: 4.0000 mg | ORAL_TABLET | Freq: Four times a day (QID) | ORAL | Status: DC | PRN

## 2019-12-01 MED ORDER — SENNA 8.6 MG PO TABS: 1.0000 | ORAL_TABLET | Freq: Two times a day (BID) | ORAL | Status: DC

## 2019-12-01 MED ORDER — SODIUM CHLORIDE 0.9% IV SOLUTION: Freq: Once | INTRAVENOUS | Status: DC

## 2019-12-01 MED ORDER — HYOSCYAMINE SULFATE 0.125 MG PO TABS
0.2500 mg | ORAL_TABLET | ORAL | Status: DC | PRN
  Filled 2019-12-01: qty 2

## 2019-12-01 MED ORDER — ESCITALOPRAM OXALATE 10 MG PO TABS: 10.0000 mg | ORAL_TABLET | Freq: Every day | ORAL | Status: DC

## 2019-12-01 MED ORDER — SODIUM CHLORIDE 0.9 % IV BOLUS
1000.0000 mL | Freq: Once | INTRAVENOUS | Status: AC
  Administered 2019-12-01: 1000 mL via INTRAVENOUS

## 2019-12-01 MED ORDER — INSULIN ASPART 100 UNIT/ML ~~LOC~~ SOLN
0.0000 [IU] | Freq: Three times a day (TID) | SUBCUTANEOUS | Status: DC
  Administered 2019-12-02: 2 [IU] via SUBCUTANEOUS

## 2019-12-01 MED ORDER — LORAZEPAM 2 MG/ML IJ SOLN: 0.5000 mg | Freq: Once | INTRAMUSCULAR | Status: DC

## 2019-12-01 MED ORDER — SODIUM CHLORIDE 0.9 % IV SOLN
2.0000 g | Freq: Two times a day (BID) | INTRAVENOUS | Status: DC
  Administered 2019-12-01 – 2019-12-02 (×2): 2 g via INTRAVENOUS
  Filled 2019-12-01 (×2): qty 2

## 2019-12-01 MED ORDER — LEVALBUTEROL HCL 0.63 MG/3ML IN NEBU
0.6300 mg | INHALATION_SOLUTION | Freq: Four times a day (QID) | RESPIRATORY_TRACT | Status: DC | PRN
  Administered 2019-12-02: 0.63 mg via RESPIRATORY_TRACT
  Filled 2019-12-01: qty 3

## 2019-12-01 MED ORDER — DIVALPROEX SODIUM 125 MG PO CSDR
250.0000 mg | DELAYED_RELEASE_CAPSULE | Freq: Two times a day (BID) | ORAL | Status: DC
  Filled 2019-12-01 (×2): qty 2

## 2019-12-01 MED ORDER — MIRTAZAPINE 15 MG PO TABS
7.5000 mg | ORAL_TABLET | Freq: Every day | ORAL | Status: DC
  Filled 2019-12-01: qty 1

## 2019-12-01 MED ORDER — ACETAMINOPHEN 325 MG PO TABS: 650.0000 mg | ORAL_TABLET | ORAL | Status: DC | PRN

## 2019-12-01 MED ORDER — LORAZEPAM 0.5 MG PO TABS: 0.2500 mg | ORAL_TABLET | Freq: Every day | ORAL | Status: DC

## 2019-12-01 NOTE — ED Provider Notes (Signed)
[facility] Emergency Department Provider Note MRN:  [id number]  Arrival date & time: 12/08/2019     Chief Complaint   Weakness   History of Present Illness   Sheila [name] is a 68 y.o. year-old female with a history of dementia, stroke, diabetes presenting to the ED with chief complaint of weakness.  Progressive failure to thrive and altered mental status for the past 2 or 3 days, coming from skilled nursing facility, patient is hospice.  Family has recently visited for the first time in several months and has noted her severe worsened condition.  I was unable to obtain an accurate HPI, PMH, or ROS due to the patient's altered mental status.  Level 5 caveat.  Review of Systems  A complete 10 system review of systems was obtained and all systems are negative except as noted in the HPI and PMH.   Patient's Health History    Past Medical History:  Diagnosis Date  . Anxiety   . Aphasia   . Arthritis   . Diabetes mellitus    complicated by peripheral neuropathy  . Dysphagia following nontraumatic intracerebral hemorrhage   . Hyperlipidemia   . Muscle weakness (generalized)   . Stroke [facility]) 2015   residual right foot drop    Past Surgical History:  Procedure Laterality Date  . CHOLECYSTECTOMY    . TONSILLECTOMY      Family History  Problem Relation Age of Onset  . Diabetes Mother        Deceased  . Diabetes Brother   . Healthy Son     Social History   Socioeconomic History  . Marital status: Widowed    Spouse name: Not on file  . Number of children: 3  . Years of education: 14  . Highest education level: Not on file  Occupational History  . Occupation: retired---  H[employer]  Social Needs  . Financial resource strain: Not on file  . Food insecurity    Worry: Not on file    Inability: Not on file  . Transportation needs    Medical: Not on file    Non-medical: Not on file  Tobacco Use  . Smoking status: Former Smoker    Types: Cigarettes     Quit date: 02/15/1992    Years since quitting: 27.8  . Smokeless tobacco: Never Used  Substance and Sexual Activity  . Alcohol use: No    Alcohol/week: 0.0 standard drinks    Comment: Socially  . Drug use: No    Types: Marijuana    Comment: SMOKES IT ANY TIME SHE CAN GET IT  . Sexual activity: Not Currently    Partners: Male  Lifestyle  . Physical activity    Days per week: Not on file    Minutes per session: Not on file  . Stress: Not on file  Relationships  . Social Herbalist on phone: Not on file    Gets together: Not on file    Attends religious service: Not on file    Active member of club or organization: Not on file    Attends meetings of clubs or organizations: Not on file    Relationship status: Not on file  . Intimate partner violence    Fear of current or ex partner: Not on file    Emotionally abused: Not on file    Physically abused: Not on file    Forced sexual activity: Not on file  Other Topics Concern  .  Not on file  Social History Narrative   Fun: Talk, church activities    Denies any religious beliefs effecting health care.    Lives alone in a one story home.  Has 5 children.  Retired from [employer].     Physical Exam  Vital Signs and Nursing Notes reviewed Vitals:   12/04/2019 1700 12/08/2019 1715  BP: 96/61 97/75  Pulse: (!) 122 (!) 115  Resp: (!) 24 (!) 29  Temp:    SpO2: 96% 99%    CONSTITUTIONAL: Ill-appearing NEURO: Somnolent, largely unresponsive, protecting airway EYES:  eyes equal and reactive ENT/NECK:  no LAD, no JVD CARDIO: Tachycardic rate, well-perfused, normal S1 and S2 PULM:  CTAB no wheezing or rhonchi GI/GU:  normal bowel sounds, non-distended, non-tender MSK/SPINE:  No gross deformities, no edema SKIN:  no rash, atraumatic PSYCH:  Appropriate speech and behavior  Diagnostic and Interventional Summary    EKG Interpretation  Date/Time:  Saturday December 01 2019 17:00:03 EST Ventricular Rate:   122 PR Interval:    QRS Duration: 67 QT Interval:  362 QTC Calculation: 516 R Axis:   37 Text Interpretation: Sinus tachycardia Abnormal R-wave progression, early transition Borderline T abnormalities, diffuse leads Prolonged QT interval Confirmed by Gerlene Fee [phone]) on 12/14/2019 5:40:32 PM      Labs Reviewed  CBC - Abnormal; Notable for the following components:      Result Value   WBC 13.2 (*)    RBC 2.64 (*)    Hemoglobin 7.7 (*)    HCT 24.9 (*)    RDW 18.3 (*)    Platelets 471 (*)    All other components within normal limits  COMPREHENSIVE METABOLIC PANEL - Abnormal; Notable for the following components:   Sodium 150 (*)    Chloride 116 (*)    Glucose, Bld 171 (*)    BUN 31 (*)    Calcium 8.7 (*)    Total Protein 6.3 (*)    Albumin 1.8 (*)    All other components within normal limits  SARS CORONAVIRUS 2 (TAT 6-24 HRS)  CULTURE, BLOOD (SINGLE)  LACTIC ACID, PLASMA  URINALYSIS, ROUTINE W REFLEX MICROSCOPIC    XR Chest Single View  Final Result      Medications  sodium chloride 0.9 % bolus 1,000 mL (1,000 mLs Intravenous New Bag/Given 12/11/2019 1645)     Procedures  /  Critical Care .Critical Care Performed by: Maudie Flakes, MD Authorized by: Maudie Flakes, MD   Critical care provider statement:    Critical care time (minutes):  45   Critical care was necessary to treat or prevent imminent or life-threatening deterioration of the following conditions:  Sepsis   Critical care was time spent personally by me on the following activities:  Discussions with consultants, evaluation of patient's response to treatment, examination of patient, ordering and performing treatments and interventions, ordering and review of laboratory studies, ordering and review of radiographic studies, pulse oximetry, re-evaluation of patient's condition, obtaining history from patient or surrogate and review of old charts (End-of-life care discussions)    ED Course and Medical  Decision Making  I have reviewed the triage vital signs and the nursing notes.  Pertinent labs & imaging results that were available during my care of the patient were reviewed by me and considered in my medical decision making (see below for details).     Concern for sepsis and/or early stages of dying process in this 68 year old female with advanced dementia, history of  stroke, here with failure to thrive, soft blood pressure, tachycardia, tachypneic, hypoxic requiring 2 to 4 L nasal cannula.  Son at bedside, able to have extended conversations regarding goals of care.  In summary, son is POA and is not quite ready to proceed with full comfort measures.  Would appreciate limited intervention with blood work, fluids, basic testing until he can further discuss with his family.  Will admit to hospitalist service, will consult palliative.  Palliative will see patient in the morning, admitted to hospital service for further care.  Barth Kirks. Sedonia Small, MD [place] [email or web address]  Final Clinical Impressions(s) / ED Diagnoses     ICD-10-CM   1. Hypernatremia  E87.0   2. SOB (shortness of breath)  R06.02 XR Chest Single View    XR Chest Single View  3. Dehydration  E86.0   4. Adult failure to thrive  R62.7     ED Discharge Orders    None       Discharge Instructions Discussed with and Provided to Patient:   Discharge Instructions   None       Maudie Flakes, MD 12/07/2019 [phone]

## 2019-12-01 NOTE — Progress Notes (Signed)
Pharmacy Antibiotic Note  Sheila [name] Sheila [name] is a 68 y.o. female admitted on 12/12/2019 with AMS/sepsis.  Pharmacy has been consulted for Vancomycin and Cefepime dosing.  Plan: Vancomycin 1 g IV now, then 750 mg IV q24h Est AUC 507 Cefepime 2 g IV q12h     Temp (24hrs), Avg:99 F (37.2 C), Min:98.3 F (36.8 C), Max:99.7 F (37.6 C)  Recent Labs  Lab 12/09/2019 1620 12/11/2019 2147  WBC 13.2*  --   CREATININE 0.91 1.03*  LATICACIDVEN 1.5  --     CrCl cannot be calculated (Unknown ideal weight.).    Allergies  Allergen Reactions  . Sulfa Antibiotics Other (See Comments)    As noted on MAR  . Paxil [Paroxetine Hcl] Other (See Comments)    Dreams/ nightmares  . Iopamidol Rash      Caryl Pina 11/30/2019 10:53 PM

## 2019-12-01 NOTE — H&P (Signed)
History and Physical   Sheila [name] [id number] DOB: [birth year] DOA: 12/23/2019  Referring MD/NP/PA: Dr. Sedonia Small  PCP: Luetta Nutting, DO   Outpatient Specialists: None  Patient coming from: Skilled nursing facility  Chief Complaint: Altered mental status and generalized weakness  HPI: Sheila [name] is a 68 y.o. female with medical history significant of diabetes, dementia, previous CVA aphasia, peripheral neuropathy, dysphagia, who is a resident of nursing facility and on hospice care but was brought in after family visited and found patient in bad shape.  Doubt been there in a while until now.  They want patient to be in very bad shape and debilitated.  She is confused not communicating.  Patient also has stopped eating and drinking.  She is weak.  Family decided bring patient to the hospital where should not be evaluated.  Patient not communicating.  Patient is currently obtunded and not responding much.  Not able to take any oral medications appears to be cachectic.Marland Kitchen  ED Course: Temperature 99.7.  Pulse 106, blood pressure is 93/74 respirate of 34 oxygen 86 on room air.  Sodium is 150 potassium 4.5 chloride of 116 CO2 23 glucose 171 BUN 31 creatinine 0.91.  White count of 13 point with lactic acid 3.5.  Hemoglobin 7.7.  Urinalysis is currently pending.  COVID-19 screen is pending.  X-ray showed possible debris and fluid at the left lung base.  Patient will be admitted for further treatment.  Review of Systems: As per HPI otherwise 10 point review of systems negative.    Past Medical History:  Diagnosis Date  . Anxiety   . Aphasia   . Arthritis   . Diabetes mellitus    complicated by peripheral neuropathy  . Dysphagia following nontraumatic intracerebral hemorrhage   . Hyperlipidemia   . Muscle weakness (generalized)   . Stroke [facility]) 2015   residual right foot drop    Past Surgical History:  Procedure Laterality Date  . CHOLECYSTECTOMY    . TONSILLECTOMY       reports that she quit smoking about 27 years ago. Her smoking use included cigarettes. She has never used smokeless tobacco. She reports that she does not drink alcohol or use drugs.  Allergies  Allergen Reactions  . Sulfa Antibiotics Other (See Comments)    As noted on MAR  . Paxil [Paroxetine Hcl] Other (See Comments)    Dreams/ nightmares  . Iopamidol Rash    Family History  Problem Relation Age of Onset  . Diabetes Mother        Deceased  . Diabetes Brother   . Healthy Son      Prior to Admission medications   Medication Sig Start Date End Date Taking? Authorizing Provider  acetaminophen (TYLENOL) 325 MG tablet Take 650 mg by mouth every 4 (four) hours as needed for mild pain, moderate pain or headache.   Yes [provider]  baclofen (LIORESAL) 10 MG tablet Take 5 mg by mouth 3 (three) times daily.   Yes [provider]  divalproex (DEPAKOTE SPRINKLE) 125 MG capsule Take 1 capsule (125 mg total) by mouth every 8 (eight) hours. Patient taking differently: Take 250 mg by mouth 2 (two) times daily.  06/01/18  Yes Ghimire, Henreitta Leber, MD  escitalopram (LEXAPRO) 10 MG tablet Take 10 mg by mouth daily.   Yes [provider]  hyoscyamine (LEVSIN) 0.125 MG tablet Take 1 tablet by mouth every 2 (two) hours as needed for bladder spasms or cramping.  11/20/19  Yes [provider]  LORazepam (ATIVAN) 0.5 MG tablet Take 1 tablet (0.5 mg total) by mouth at bedtime. Patient taking differently: Take 0.25 mg by mouth at bedtime.  06/01/18  Yes Ghimire, Henreitta Leber, MD  metFORMIN (GLUCOPHAGE) 500 MG tablet Take 500 mg by mouth 2 (two) times daily with a meal.   Yes [provider]  mirtazapine (REMERON) 7.5 MG tablet Take 7.5 mg by mouth at bedtime.   Yes [provider]  ondansetron (ZOFRAN-ODT) 4 MG disintegrating tablet Take 4 mg by mouth every 8 (eight) hours as needed for nausea or vomiting.   Yes [provider]  senna (SENOKOT) 8.6 MG  TABS tablet Take 1 tablet by mouth 2 (two) times daily.   Yes [provider]  atorvastatin (LIPITOR) 20 MG tablet Take 1 tablet (20 mg total) by mouth daily at 6 PM. Patient not taking: Reported on 12/11/2019 01/10/18   Mary Sella, NP  gabapentin (NEURONTIN) 100 MG capsule Take 1 capsule (100 mg total) by mouth at bedtime. Patient not taking: Reported on 12/15/2019 06/01/18   Jonetta Osgood, MD  insulin aspart (NOVOLOG) 100 UNIT/ML injection 0-9 Units, Subcutaneous, 3 times daily with meals,  CBG < 70: implement hypoglycemia protocol CBG 70 - 120: 0 units CBG 121 - 150: 1 unit CBG 151 - 200: 2 units CBG 201 - 250: 3 units CBG 251 - 300: 5 units CBG 301 - 350: 7 units CBG 351 - 400: 9 units CBG > 400: call MD and obtain STAT lab verification Patient not taking: Reported on 11/30/2019 06/01/18   Jonetta Osgood, MD  magnesium oxide (MAG-OX) 400 (241.3 Mg) MG tablet Take 1 tablet (400 mg total) by mouth daily. Patient not taking: Reported on 12/26/2019 01/10/18   Mary Sella, NP  potassium chloride (K-DUR) 10 MEQ tablet Take 1 tablet (10 mEq total) by mouth daily. Patient not taking: Reported on 12/23/2019 01/10/18   Mary Sella, NP    Physical Exam: Vitals:   12/04/2019 2000 12/16/2019 2015 12/11/2019 2030 12/02/2019 2145  BP: 113/79 106/73  98/61  Pulse: (!) 33   (!) 122  Resp: (!) 36 (!) 29 (!) 33 20  Temp:    98.3 F (36.8 C)  TempSrc:    Oral  SpO2: 98%   100%      Constitutional: Completely obtunded,Responded to pain Vitals:   12/23/2019 2000 12/04/2019 2015 12/19/2019 2030 12/04/2019 2145  BP: 113/79 106/73  98/61  Pulse: (!) 33   (!) 122  Resp: (!) 36 (!) 29 (!) 33 20  Temp:    98.3 F (36.8 C)  TempSrc:    Oral  SpO2: 98%   100%   Eyes: PERRL, lids and conjunctivae normal ENMT: Mucous membranes are dry. Posterior pharynx clear of any exudate or lesions.Normal dentition.  Neck: normal, supple, no masses, no thyromegaly Respiratory: clear to auscultation  bilaterally, no wheezing, no crackles. Normal respiratory effort. No accessory muscle use.  Cardiovascular: Tachycardic, no murmurs / rubs / gallops. No extremity edema. 2+ pedal pulses. No carotid bruits.  Abdomen: no tenderness, no masses palpated. No hepatosplenomegaly. Bowel sounds positive.  Musculoskeletal: no clubbing / cyanosis. No joint deformity upper and lower extremities. Good ROM, no contractures. Normal muscle tone.  Skin: no rashes, lesions, ulcers. No induration Neurologic: Catatonic but moves all limbs Psychiatric: Completely obtunded, laying down in bed moaning, responding to pain    Labs on Admission: I have personally reviewed following labs and imaging  studies  CBC: Recent Labs  Lab 12/13/2019 1620  WBC 13.2*  HGB 7.7*  HCT 24.9*  MCV 94.3  PLT 99991111*   Basic Metabolic Panel: Recent Labs  Lab 12/15/2019 1620  NA 150*  K 4.5  CL 116*  CO2 23  GLUCOSE 171*  BUN 31*  CREATININE 0.91  CALCIUM 8.7*   GFR: CrCl cannot be calculated (Unknown ideal weight.). Liver Function Tests: Recent Labs  Lab 12/17/2019 1620  AST 22  ALT 9  ALKPHOS 80  BILITOT 1.2  PROT 6.3*  ALBUMIN 1.8*   No results for input(s): LIPASE, AMYLASE in the last 168 hours. No results for input(s): AMMONIA in the last 168 hours. Coagulation Profile: No results for input(s): INR, PROTIME in the last 168 hours. Cardiac Enzymes: No results for input(s): CKTOTAL, CKMB, CKMBINDEX, TROPONINI in the last 168 hours. BNP (last 3 results) No results for input(s): PROBNP in the last 8760 hours. HbA1C: No results for input(s): HGBA1C in the last 72 hours. CBG: Recent Labs  Lab 12/21/2019 2229  GLUCAP 158*   Lipid Profile: No results for input(s): CHOL, HDL, LDLCALC, TRIG, CHOLHDL, LDLDIRECT in the last 72 hours. Thyroid Function Tests: No results for input(s): TSH, T4TOTAL, FREET4, T3FREE, THYROIDAB in the last 72 hours. Anemia Panel: No results for input(s): VITAMINB12, FOLATE, FERRITIN,  TIBC, IRON, RETICCTPCT in the last 72 hours. Urine analysis:    Component Value Date/Time   COLORURINE YELLOW 05/29/2018 0706   APPEARANCEUR HAZY (A) 05/29/2018 0706   LABSPEC 1.011 05/29/2018 0706   PHURINE 5.0 05/29/2018 0706   GLUCOSEU NEGATIVE 05/29/2018 0706   HGBUR NEGATIVE 05/29/2018 0706   BILIRUBINUR NEGATIVE 05/29/2018 0706   BILIRUBINUR Neg 01/22/2014 1656   KETONESUR 5 (A) 05/29/2018 0706   PROTEINUR NEGATIVE 05/29/2018 0706   UROBILINOGEN 0.2 01/22/2014 1656   NITRITE NEGATIVE 05/29/2018 0706   LEUKOCYTESUR NEGATIVE 05/29/2018 0706   Sepsis Labs: @LABRCNTIP (procalcitonin:4,lacticidven:4) )No results found for this or any previous visit (from the past 240 hour(s)).   Radiological Exams on Admission: Xr Chest Single View  Result Date: 11/29/2019 CLINICAL DATA:  68 year old female with tachycardia. Patient refusing fluid. EXAM: PORTABLE CHEST 1 VIEW COMPARISON:  Chest radiograph dated 02/24/2019. FINDINGS: Streaky densities primarily involving the left lower lung field and left lung base may represent atelectasis but developing infiltrate is not excluded. Clinical correlation is recommended. No lobar consolidation, pleural effusion, pneumothorax. The cardiac silhouette is within normal limits. No acute osseous pathology. IMPRESSION: Possible developing infiltrate at the left lung base. Clinical correlation is recommended. Electronically Signed   By: Anner Crete M.D.   On: 12/09/2019 16:43    Assessment/Plan Principal Problem:   Encephalopathy acute Active Problems:   Hyperlipidemia   Type 2 diabetes mellitus with neurologic complication (HCC)   Dysphagia following nontraumatic intracerebral hemorrhage   Multi-infarct dementia without behavioral disturbance (HCC)   Weakness generalized   Hypoxia   Hypernatremia   Leucocytosis   Pressure injury of skin     #1 acute encephalopathy: Patient has baseline dementia but now worsened.  Not communicating.  She is  moaning and appears to be worsening.  Cause not clear.  She had multiple reasons to have altered mental status.  We will hydrate patient for now try and get clarity in the morning.  We may have to proceed with comfort care if she is not doing better.  #2 generalized weakness: Secondary to multiple problems as indicated above.  Continue treatment.  #3 Healthcare associated pneumonia with hypoxia: Continue  oxygen.  Initiate antibiotics.  Will try bank and cefepime.  If no response again will discuss goals of care once more with family in the morning.  #4 type 2 diabetes: Sliding scale insulin.  Hold Metformin.  #5 severe dehydration: Hydrate patient aggressively.  #6 hypernatremia: Most likely secondary to dehydration.  Expand with saline and then change to half-normal saline.  #7 leukocytosis: Probably as a result of pneumonia and dehydration.  Continue monitor  #8 anemia: Patient has about 3 g drop in hemoglobin recently.  Will consider transfusion if family want to continue with aggressive care.   DVT prophylaxis: Heparin Code Status: Full code Family Communication: Husband Disposition Plan: To be determined Consults called: None Admission status: Inpatient  Severity of Illness: The appropriate patient status for this patient is INPATIENT. Inpatient status is judged to be reasonable and necessary in order to provide the required intensity of service to ensure the patient's safety. The patient's presenting symptoms, physical exam findings, and initial radiographic and laboratory data in the context of their chronic comorbidities is felt to place them at high risk for further clinical deterioration. Furthermore, it is not anticipated that the patient will be medically stable for discharge from the hospital within 2 midnights of admission. The following factors support the patient status of inpatient.   " The patient's presenting symptoms include altered mental status and failure to thrive.  " The worrisome physical exam findings include obtundation and failure to thrive. " The initial radiographic and laboratory data are worrisome because of hyponatremia. " The chronic co-morbidities include dementia and CVA.   * I certify that at the point of admission it is my clinical judgment that the patient will require inpatient hospital care spanning beyond 2 midnights from the point of admission due to high intensity of service, high risk for further deterioration and high frequency of surveillance required.Barbette Merino MD [facility] Pager [phone]  If 7PM-7AM, please contact night-coverage [email or web address] Password [facility]  12/26/2019, 10:34 PM

## 2019-12-01 NOTE — Progress Notes (Signed)
Pt BP 89/56 with MAP of 68. Tachycardic at 133 and respiratory rate fluctuating in 30s-40s. Oxygen saturation in low 80s. Axillary temp 100.9.   Paged rapid response and respiratory. On-call provider ordered non-rebreather mask and Xopenex.   Respiratory and rapid response at bedside. Will implement orders and continue to monitor.

## 2019-12-01 NOTE — ED Triage Notes (Signed)
Pt presents from [facility] for failure to thrive, "drooled" food while eating yesterday, refused food today. Pt is on hospice (stroke, dementia) but does not have a DNR. Pt family request transport EMS. With EMS, pt is tachycardic with rhonchi an ddiminished BS bilat. Pt is nonverbal at baseline.

## 2019-12-02 ENCOUNTER — Encounter (HOSPITAL_COMMUNITY): Payer: Self-pay

## 2019-12-02 ENCOUNTER — Inpatient Hospital Stay (HOSPITAL_COMMUNITY): Payer: Medicare Other

## 2019-12-02 DIAGNOSIS — J9601 Acute respiratory failure with hypoxia: Secondary | ICD-10-CM

## 2019-12-02 DIAGNOSIS — R1310 Dysphagia, unspecified: Secondary | ICD-10-CM

## 2019-12-02 DIAGNOSIS — A419 Sepsis, unspecified organism: Principal | ICD-10-CM

## 2019-12-02 DIAGNOSIS — R627 Adult failure to thrive: Secondary | ICD-10-CM

## 2019-12-02 DIAGNOSIS — G9341 Metabolic encephalopathy: Secondary | ICD-10-CM

## 2019-12-02 DIAGNOSIS — N179 Acute kidney failure, unspecified: Secondary | ICD-10-CM

## 2019-12-02 DIAGNOSIS — J690 Pneumonitis due to inhalation of food and vomit: Secondary | ICD-10-CM

## 2019-12-02 DIAGNOSIS — R0603 Acute respiratory distress: Secondary | ICD-10-CM

## 2019-12-02 DIAGNOSIS — R6521 Severe sepsis with septic shock: Secondary | ICD-10-CM

## 2019-12-02 DIAGNOSIS — F0150 Vascular dementia without behavioral disturbance: Secondary | ICD-10-CM

## 2019-12-02 DIAGNOSIS — Z515 Encounter for palliative care: Secondary | ICD-10-CM

## 2019-12-02 LAB — MRSA PCR SCREENING: MRSA by PCR: NEGATIVE

## 2019-12-02 LAB — COMPREHENSIVE METABOLIC PANEL
ALT: 8 U/L (ref 0–44)
AST: 19 U/L (ref 15–41)
Albumin: 1.6 g/dL — ABNORMAL LOW (ref 3.5–5.0)
Alkaline Phosphatase: 71 U/L (ref 38–126)
Anion gap: 17 — ABNORMAL HIGH (ref 5–15)
BUN: 33 mg/dL — ABNORMAL HIGH (ref 8–23)
CO2: 16 mmol/L — ABNORMAL LOW (ref 22–32)
Calcium: 8.2 mg/dL — ABNORMAL LOW (ref 8.9–10.3)
Chloride: 118 mmol/L — ABNORMAL HIGH (ref 98–111)
Creatinine, Ser: 1.2 mg/dL — ABNORMAL HIGH (ref 0.44–1.00)
GFR calc Af Amer: 53 mL/min — ABNORMAL LOW (ref >60)
GFR calc non Af Amer: 46 mL/min — ABNORMAL LOW (ref >60)
Glucose, Bld: 218 mg/dL — ABNORMAL HIGH (ref 70–99)
Potassium: 4.7 mmol/L (ref 3.5–5.1)
Sodium: 151 mmol/L — ABNORMAL HIGH (ref 135–145)
Total Bilirubin: 1.4 mg/dL — ABNORMAL HIGH (ref 0.3–1.2)
Total Protein: 5.8 g/dL — ABNORMAL LOW (ref 6.5–8.1)

## 2019-12-02 LAB — CBC
HCT: 22.9 % — ABNORMAL LOW (ref 36.0–46.0)
Hemoglobin: 7 g/dL — ABNORMAL LOW (ref 12.0–15.0)
MCH: 29.3 pg (ref 26.0–34.0)
MCHC: 30.6 g/dL (ref 30.0–36.0)
MCV: 95.8 fL (ref 80.0–100.0)
Platelets: 472 10*3/uL — ABNORMAL HIGH (ref 150–400)
RBC: 2.39 MIL/uL — ABNORMAL LOW (ref 3.87–5.11)
RDW: 18.3 % — ABNORMAL HIGH (ref 11.5–15.5)
WBC: 15 10*3/uL — ABNORMAL HIGH (ref 4.0–10.5)
nRBC: 0 % (ref 0.0–0.2)

## 2019-12-02 LAB — GLUCOSE, CAPILLARY: Glucose-Capillary: 212 mg/dL — ABNORMAL HIGH (ref 70–99)

## 2019-12-02 MED ORDER — ONDANSETRON 4 MG PO TBDP: 4.0000 mg | ORAL_TABLET | Freq: Four times a day (QID) | ORAL | Status: DC | PRN

## 2019-12-02 MED ORDER — BIOTENE DRY MOUTH MT LIQD: 15.0000 mL | OROMUCOSAL | Status: DC | PRN

## 2019-12-02 MED ORDER — KETOROLAC TROMETHAMINE 30 MG/ML IJ SOLN
15.0000 mg | Freq: Once | INTRAMUSCULAR | Status: AC
  Administered 2019-12-02: 15 mg via INTRAVENOUS
  Filled 2019-12-02: qty 1

## 2019-12-02 MED ORDER — ONDANSETRON HCL 4 MG/2ML IJ SOLN: 4.0000 mg | Freq: Four times a day (QID) | INTRAMUSCULAR | Status: DC | PRN

## 2019-12-02 MED ORDER — VANCOMYCIN HCL IN DEXTROSE 750-5 MG/150ML-% IV SOLN: 750.0000 mg | INTRAVENOUS | Status: DC

## 2019-12-02 MED ORDER — ACETAMINOPHEN 650 MG RE SUPP
650.0000 mg | Freq: Four times a day (QID) | RECTAL | Status: DC | PRN
  Administered 2019-12-02 – 2019-12-03 (×2): 650 mg via RECTAL
  Filled 2019-12-02 (×2): qty 1

## 2019-12-02 MED ORDER — GLYCOPYRROLATE 0.2 MG/ML IJ SOLN: 0.2000 mg | INTRAMUSCULAR | Status: DC | PRN

## 2019-12-02 MED ORDER — ACETAMINOPHEN 325 MG PO TABS: 650.0000 mg | ORAL_TABLET | Freq: Four times a day (QID) | ORAL | Status: DC | PRN

## 2019-12-02 MED ORDER — LORAZEPAM 2 MG/ML PO CONC: 1.0000 mg | ORAL | Status: DC | PRN

## 2019-12-02 MED ORDER — LORAZEPAM 1 MG PO TABS: 1.0000 mg | ORAL_TABLET | ORAL | Status: DC | PRN

## 2019-12-02 MED ORDER — POLYVINYL ALCOHOL 1.4 % OP SOLN
1.0000 [drp] | Freq: Four times a day (QID) | OPHTHALMIC | Status: DC | PRN
  Filled 2019-12-02: qty 15

## 2019-12-02 MED ORDER — VANCOMYCIN HCL IN DEXTROSE 1-5 GM/200ML-% IV SOLN
1000.0000 mg | Freq: Once | INTRAVENOUS | Status: AC
  Administered 2019-12-02: 1000 mg via INTRAVENOUS
  Filled 2019-12-02: qty 200

## 2019-12-02 MED ORDER — SODIUM CHLORIDE 0.9 % IV BOLUS
500.0000 mL | Freq: Once | INTRAVENOUS | Status: AC
  Administered 2019-12-02: 500 mL via INTRAVENOUS

## 2019-12-02 MED ORDER — HALOPERIDOL LACTATE 2 MG/ML PO CONC
0.5000 mg | ORAL | Status: DC | PRN
  Filled 2019-12-02: qty 0.3

## 2019-12-02 MED ORDER — HALOPERIDOL 0.5 MG PO TABS
0.5000 mg | ORAL_TABLET | ORAL | Status: DC | PRN
  Filled 2019-12-02: qty 1

## 2019-12-02 MED ORDER — GLYCOPYRROLATE 0.2 MG/ML IJ SOLN
0.2000 mg | INTRAMUSCULAR | Status: DC | PRN
  Administered 2019-12-03 (×3): 0.2 mg via INTRAVENOUS
  Filled 2019-12-02 (×3): qty 1

## 2019-12-02 MED ORDER — HALOPERIDOL LACTATE 5 MG/ML IJ SOLN: 0.5000 mg | INTRAMUSCULAR | Status: DC | PRN

## 2019-12-02 MED ORDER — GLYCOPYRROLATE 1 MG PO TABS
1.0000 mg | ORAL_TABLET | ORAL | Status: DC | PRN
  Filled 2019-12-02: qty 1

## 2019-12-02 MED ORDER — SODIUM CHLORIDE 0.45 % IV SOLN
INTRAVENOUS | Status: DC
  Administered 2019-12-02 (×2): via INTRAVENOUS

## 2019-12-02 MED ORDER — LORAZEPAM 2 MG/ML IJ SOLN: 1.0000 mg | INTRAMUSCULAR | Status: DC | PRN

## 2019-12-02 MED ORDER — MORPHINE SULFATE (PF) 2 MG/ML IV SOLN
2.0000 mg | INTRAVENOUS | Status: AC | PRN
  Administered 2019-12-02: 4 mg via INTRAVENOUS
  Administered 2019-12-02: 2 mg via INTRAVENOUS
  Administered 2019-12-02 – 2019-12-03 (×3): 4 mg via INTRAVENOUS
  Administered 2019-12-03 (×2): 2 mg via INTRAVENOUS
  Filled 2019-12-02 (×2): qty 2
  Filled 2019-12-02 (×2): qty 1
  Filled 2019-12-02: qty 2
  Filled 2019-12-02: qty 1
  Filled 2019-12-02: qty 2

## 2019-12-02 NOTE — Progress Notes (Addendum)
Notified on-call provider of Hgb 7.0 and sodium 151 Will continue to monitor.

## 2019-12-02 NOTE — Progress Notes (Signed)
Chaplain provided prayer and spiritual presence to family at bedside or Sheila [name].  Family shared stories of their mom with chaplain.Chaplain read scripture around mom with family.   Chaplain will follow-up as needed.

## 2019-12-02 NOTE — Progress Notes (Signed)
Temp has improved to 99.6 axillary and O2 sats have improved to 99% with 10L HFNC.   Pt remains tachycardic and tachypneic, but does not appear to be in acute distress. Pt appears to be more restful.   Will continue to monitor.

## 2019-12-02 NOTE — Consult Note (Signed)
Consultation Note Date: 12/02/2019   Patient Name: Sheila [name]  DOB: 1951-04-25  MRN: [id number]  Age / Sex: 68 y.o., female  PCP: Luetta Nutting, DO Referring Physician: Modena Jansky, MD  Reason for Consultation: Establishing goals of care and Psychosocial/spiritual support  HPI/Patient Profile: 68 y.o. female  with past medical history of dementia, CVA (2015) with aphasia and dysphagia, DM who was admitted on 12/08/2019 from SNF obtunded, with HCAP and septic shock.  She was found to have bilateral pneumonia likely due to aspiration.  She is COVID negative.  Overnight she had a rapid response with resp rate of 70 requiring 15L on NRB.  Hgb 7 (down from 13 in 01/2019) and sodium 151.  WBC 26.   Clinical Assessment and Goals of Care:  I have reviewed medical records including EPIC notes, labs and imaging, received report from Dr. Algis Liming, assessed the patient and then met at the bedside along with her HCPOA, Molli Barrows, son Elta Guadeloupe, daughter Thurmond Butts and Nevin Bloodgood  to discuss diagnosis prognosis, GOC, EOL wishes, disposition and options.  I introduced Palliative Medicine as specialized medical care for people living with serious illness. It focuses on providing relief from the symptoms and stress of a serious illness.   We discussed Damara's current illness and what it means in the larger context of her on-going co-morbidities. Specifically we talked about aspiration pneumonia in a patient with CVA and residual dysphagia as well as weakness from significant anemia. Natural disease trajectory and expectations at EOL were discussed.  I attempted to elicit values and goals of care important to the patient and her children.  Family indicated that her comfort was of prime importance.   Mark and I had spoken earlier this morning - he had a good understanding that his mother is nearing end of life.  He questioned if we are  certain that is the case and what may things look like if we did try a "full court press"?  I explained that there is a good chance we would not be successful in stabilizing her even with aggressive care - she would likely die.  However if we were successful in getting her thru this hospitalization she would likely be re-admitted in days to weeks in the same condition or worse.  Roderic Palau added that if we attempted aggressive care we would be trying to heal her in order to return to [facility].  He did not seem to like that prospect - and the family agreed.  Roderic Palau asked me to describe what comfort measures would look like in terms of the next few days.  I explained that she is in the process of dying which is not something that would be comfortable to prolong.  Consequently we stop "prolonging" measures such as IV fluids, antibiotics, high flow oxygen and instead used medications to treat pain, shortness of breath and anxiety.  Our goal is to make her comfortable and peaceful and to allow the family time to spend with her.  Roderic Palau asked if there  were any other issues to consider or questions they should be asking.  I explained that we will observe her under comfort measures - if by tomorrow she is rapidly declining then she will likely die here in the hospital.  However if tomorrow she seems to have stabilized then perhaps the family should consider [facility].  I described their expertise and services.  I shared my personal experience with [facility].    We talked about their need for her medical records. Multiple family members are in the medical field and need "context" to understand how she declined so quickly.   I directed them to the medical records departments at [facility] and [facility].     Primary Decision Maker:  Lenore Cordia.    SUMMARY OF RECOMMENDATIONS     Full comfort measures.    Orders adjusted to reflect comfort measures.  PRN medications added.  Please do not increase  oxygen or escalate care - utilize morphine PRN dyspnea  Chaplain consulted (Thank you!)  PMT will follow up for symptom management.  If she stabilizes and lingers we will discuss [place] with family further.  Concept introduced today.  Code Status/Advance Care Planning:  DNR   Additional Recommendations (Limitations, Scope, Preferences):  Full Comfort Care  Palliative Prophylaxis:   Delirium Protocol  Psycho-social/Spiritual:   Desire for further Chaplaincy support: yes  Prognosis:  Days.      Discharge Planning: To Be Determined Hospital death vs [facility].      Primary Diagnoses: Present on Admission:  Hyperlipidemia  Type 2 diabetes mellitus with neurologic complication (HCC)  Encephalopathy acute  Multi-infarct dementia without behavioral disturbance (HCC)  Hypoxia  Hypernatremia  Leucocytosis   I have reviewed the medical record, interviewed the patient and family, and examined the patient. The following aspects are pertinent.  Past Medical History:  Diagnosis Date   Anxiety    Aphasia    Arthritis    Diabetes mellitus    complicated by peripheral neuropathy   Dysphagia following nontraumatic intracerebral hemorrhage    Hyperlipidemia    Muscle weakness (generalized)    Stroke ([place]) 2015   residual right foot drop   Social History   Socioeconomic History   Marital status: Widowed    Spouse name: Not on file   Number of children: 3   Years of education: 22   Highest education level: Not on file  Occupational History   Occupation: retired---  [place] resource strain: Not on file   Food insecurity    Worry: Not on file    Inability: Not on file   Transportation needs    Medical: Not on file    Non-medical: Not on file  Tobacco Use   Smoking status: Former Smoker    Types: Cigarettes    Quit date: 02/15/1992    Years since quitting: 27.8   Smokeless tobacco: Never Used    Substance and Sexual Activity   Alcohol use: No    Alcohol/week: 0.0 standard drinks    Comment: Socially   Drug use: No    Types: Marijuana    Comment: SMOKES IT ANY TIME SHE CAN GET IT   Sexual activity: Not Currently    Partners: Male  Lifestyle   Physical activity    Days per week: Not on file    Minutes per session: Not on file   Stress: Not on file  Relationships   Social connections    Talks on  phone: Not on file    Gets together: Not on file    Attends religious service: Not on file    Active member of club or organization: Not on file    Attends meetings of clubs or organizations: Not on file    Relationship status: Not on file  Other Topics Concern   Not on file  Social History Narrative   Fun: Talk, church activities    Denies any religious beliefs effecting health care.    Lives alone in a one story home.  Has 5 children.  Retired from [employer].   Family History  Problem Relation Age of Onset   Diabetes Mother        Deceased   Diabetes Brother    Healthy Son    Scheduled Meds:  sodium chloride   Intravenous Once   baclofen  5 mg Oral TID   divalproex  250 mg Oral BID   enoxaparin (LOVENOX) injection  40 mg Subcutaneous Q24H   escitalopram  10 mg Oral Daily   insulin aspart  0-6 Units Subcutaneous TID WC   LORazepam  0.25 mg Oral QHS   mirtazapine  7.5 mg Oral QHS   senna  1 tablet Oral BID   Continuous Infusions:  sodium chloride 75 mL/hr at 12/02/19 2376   ceFEPime (MAXIPIME) IV 2 g (12/16/2019 2330)   [START ON 12/03/2019] vancomycin     PRN Meds:.acetaminophen, hyoscyamine, levalbuterol, ondansetron **OR** ondansetron (ZOFRAN) IV Allergies  Allergen Reactions   Sulfa Antibiotics Other (See Comments)    As noted on MAR   Paxil [Paroxetine Hcl] Other (See Comments)    Dreams/ nightmares   Iopamidol Rash   Review of Systems patient non-verbal.  Physical Exam  Thin frail female, lying comfortably in bed,  eyes closed, respirations elevated.  Non responsive to voice or touch. CV tachy resp shallow breaths Abdomen soft, nt, nd Extremities - legs are pulled up, trace edema  Vital Signs: BP (!) 82/61 (BP Location: Left Arm)    Pulse (!) 129    Temp (!) 97.4 F (36.3 C) (Axillary)    Resp (!) 52    Ht _0  (1.651 m)    Wt 51.7 kg    SpO2 97%    BMI 18.97 kg/m  Pain Scale: Faces       SpO2: SpO2: 97 % O2 Device:SpO2: 97 % O2 Flow Rate: .O2 Flow Rate (L/min): 3 L/min  IO: Intake/output summary:   Intake/Output Summary (Last 24 hours) at 12/02/2019 0735 Last data filed at 12/02/2019 0533 Gross per 24 hour  Intake 992.91 ml  Output --  Net 992.91 ml    LBM: Last BM Date: (PTA) Baseline Weight: Weight: 51.7 kg Most recent weight: Weight: 51.7 kg     Palliative Assessment/Data: 10%     Time In: 11:00 Time Out: 12:10 Time Total: 70 min. Visit consisted of counseling and education dealing with the complex and emotionally intense issues surrounding the need for palliative care and symptom management in the setting of serious and potentially life-threatening illness. Greater than 50%  of this time was spent counseling and coordinating care related to the above assessment and plan.  Signed by: [name], PA-C Palliative Medicine Pager: [phone]  Please contact Palliative Medicine Team phone at [phone] for questions and concerns.  For individual provider: See Shea Evans

## 2019-12-02 NOTE — Progress Notes (Signed)
MD made aware of pt status  ?

## 2019-12-02 NOTE — Progress Notes (Signed)
CSW was consulted to assist with getting medical records from a SNF. CSW called and spoke with patient's son and Chauncey Reading, Roderic Palau.  Patient has been at [facility] and family wants medical records. CSW provided him with instructions to contact the facility and speak with someone in the admissions office. CSW explained that was outside her scope of practice. CSW offered support through this difficult time.   PMT is following the patient and she is receiving comfort care. Haynes Dage will meet with family on Monday to follow up about disposition plan.   CSW will continue to follow and assist as needed.   Domenic Schwab, MSW, [place] Worker [facility]  [phone]

## 2019-12-02 NOTE — Progress Notes (Signed)
Called by bedside RN regarding pt decline in condition over the past 3-4 hours. VS continue to be unstable. Review of chart indicates pt family is considering palliative care measures. Discussed POC with bedside RN and attempted to call son regarding pt condition. No answer. Will continue treating HCAP as previously indicated by Dr. Jonelle Sidle and providing comfort measures.    Lovey Newcomer, NP [facility] 7p-7a [phone]

## 2019-12-02 NOTE — Progress Notes (Signed)
MD ordered blood transfusion and IV antibiotics. Upon talking to pt's son, he wants to hold off on the blood transfusion and continue with IV antibiotics. MD made aware of son's decision.

## 2019-12-02 NOTE — Progress Notes (Signed)
PROGRESS NOTE   Sheila [name]  XTG:[id number]    DOB: 1951/03/24    DOA: 12/11/2019  PCP: Luetta Nutting, DO   I have briefly reviewed patients previous medical records in [facility].  Chief Complaint:   Chief Complaint  Patient presents with  . Weakness    Brief Narrative:  68 year old female, resident of [facility] SNF for the last 1.5 years, initially used to be able to ambulate with a walker, has been bedbound for several months, PMH of multiple strokes with residual right hemiparesis, dysphagia, dementia, DM, HLD, large sacral decubitus ulcer (POA), recently changed to hospice at SNF over the last 2 to 3 weeks, sent from SNF to [facility] on 12/5 due to poor oral intake, difficulty swallowing, progressive mental status changes and less responsiveness and progressive weakness.  Admitted for dehydration with hypernatremia, acute kidney injury, severe sepsis/septic shock due to suspected aspiration pneumonia, acute respiratory failure with hypoxia and severe progressive failure to thrive.  Discussed extensively with family, advised regarding overall poor prognosis, discussed options of continued aggressive care which would not change her long-term trajectory versus transitioning to full comfort care.  Son/HCPOA strongly considering transitioning to full comfort care and will be by to the hospital with family to see her shortly.  PMT consulted.   Assessment & Plan:  Principal Problem:   Encephalopathy acute Active Problems:   Hyperlipidemia   Type 2 diabetes mellitus with neurologic complication (HCC)   Dysphagia following nontraumatic intracerebral hemorrhage   Multi-infarct dementia without behavioral disturbance (HCC)   Weakness generalized   Hypoxia   Hypernatremia   Leucocytosis   Pressure injury of skin   Severe sepsis/septic shock, aspiration pneumonia, infected sacral decubitus ulcer  Likely due to aspiration pneumonia from dysphagia due to stroke and  acute metabolic encephalopathy and infected sacral decubitus ulcer.  Met sepsis criteria on admission.  Had received a liter of IV fluids thus far since admission.  Bolused an additional 1 L of fluid.  Had been empirically started on IV cefepime and vancomycin.  Remains tachypneic, mildly tachycardic, hypotensive in the 60s/40s earlier this morning, improved after boluses to SBP of 80s.  COVID-19 testing, MRSA PCR: Negative.  Blood cultures negative to date.  Overall poor prognosis.  Long discussion had with healthcare power of attorney, both sons, they are strongly considering transitioning to full comfort care and will be by to the hospital shortly to see her and make final decision.  PMT aware and on board.  Aspiration pneumonia  Management as noted above.  Large sacral decubitus ulcer, suspect stage III-4, appears somewhat infected  Present on admission.  Management as noted above.  Dehydration with hypernatremia  Secondary to poor oral intake.  Currently on IV fluids until final decision by family.  Acute metabolic encephalopathy  Secondary to severe sepsis/septic shock, dehydration with hypernatremia and acute kidney injury complicating underlying vascular dementia.  Management as noted above.  Discontinue all sedative medications i.e. baclofen, divalproex, Lexapro, Ativan and Remeron.  In any event patient not safe to take anything by mouth at this time.  Could consider CT of the head but not likely to change management to the better.  Acute respiratory failure with hypoxia  Secondary to aspiration pneumonia.  Currently on high flow nasal cannula oxygen.  Chest x-ray shows worsening bilateral infiltrates.  COVID-19 testing and MRSA PCR negative.  Anion gap metabolic acidosis  Anion gap has gone up from 11-17 overnight.  Lactate on admission was 1.5.  Suspect due to sepsis and acute kidney injury.  Currently remains on IV fluids.  Acute kidney injury   Baseline creatinine in the 0.9 range, increased to 1.2.  Suspect due to dehydration and possible ATN from shock and sepsis.  IV fluids.  Hypomagnesemia  Consider replacement if family wishes to pursue aggressive care.  Type II DM with peripheral neuropathy  Currently on very sensitive SSI.  Body mass index is 18.97 kg/m./Severe protein calorie malnutrition  Family looking to transition to full comfort care at this time.  Anemia of chronic disease  Presented with hemoglobin of 7.7 which has dropped to 7 g per DL.  Likely dilutional from IV fluids.  No overt bleeding reported.  Hemoglobin was 13.8 in February.  Severe adult failure to thrive  Multifactorial due to CVAs with residual right hemiplegia, contractures of right upper extremity and possibly both lower extremities, bedbound status, vascular dementia, extremely poor intake, severe malnutrition, large sacral decubitus ulcer, mild left heel ulcer and dysphagia.  Await family to come in to likely transition to full comfort care   DVT prophylaxis: Subcutaneous heparin.  Will likely DC when transitions to full comfort care. Code Status: DNR.  This was confirmed with patient's son/healthcare power of attorney and his brother. Family Communication: Discussed in detail this morning with both sons, one of whom is healthcare power of attorney.  Updated care in detail including critical nature of patient's illness and extremely poor prognosis.  They verbalized understanding and plan to come into the hospital to see the patient soon. Disposition: To be determined.   Consultants:   Palliative care medicine  Procedures:   None  Antimicrobials:   IV vancomycin and cefepime   Subjective:  Patient is nonverbal.  She is unable to provide any history.  History as noted above obtained from family.  Patient examined along with her female RN in room.  Objective:   Vitals:   12/02/19 0908 12/02/19 0926 12/02/19 1002 12/02/19 1041   BP: (!) 77/51 (!) 81/47  (!) 73/57  Pulse: (!) 111 (!) 109 (!) 107 (!) 107  Resp:      Temp:      TempSrc:      SpO2:  100%    Weight:      Height:        General exam: Middle-aged female, looks older than stated age, small built, frail and cachectic, chronically ill looking, lying propped up in bed, tachypneic but no painful distress.  Oral mucosa dry.  Mouth breathing at times. Respiratory system: Harsh and diminished breath sounds bilaterally with scattered few bilateral crackles.  No wheezing or rhonchi.  Tachypneic. Cardiovascular system: S1 & S2 heard, regular tachycardia. No JVD, murmurs, rubs, gallops or clicks. No pedal edema. Gastrointestinal system: Abdomen is nondistended, soft and nontender. No organomegaly or masses felt. Normal bowel sounds heard. Central nervous system: Eyes open, no real blink response.  Does not track activity.  Nonverbal.  Does not follow any instructions.  No obvious cranial nerve deficits.  Moans when tried to lift her left upper extremity or turn her to examine ulcer on the bottom. Extremities: No spontaneous limb movements noted.  Appears to have contractures of her right upper extremity and bilateral lower extremity which are completely flexed at her knees. Skin: Approximately 1 cm size stage I superficial ulcer over left heel without acute findings.  Large approximately tennis ball sized deep, stage III-4 mid sacral decubitus ulcer with some dark esharge and some foul odor but no  obvious drainage. Psychiatry: Judgement and insight cannot be assessed. Mood & affect cannot be assessed.     Data Reviewed:   I have personally reviewed following labs and imaging studies   CBC: Recent Labs  Lab 12/11/2019 1620 12/23/2019 2331 12/02/19 0316  WBC 13.2* 26.0* 15.0*  HGB 7.7* 7.7* 7.0*  HCT 24.9* 24.2* 22.9*  MCV 94.3 92.7 95.8  PLT 471* 528* 472*    Basic Metabolic Panel: Recent Labs  Lab 12/22/2019 1620 12/09/2019 2147 12/02/19 0316  NA 150*  --   151*  K 4.5  --  4.7  CL 116*  --  118*  CO2 23  --  16*  GLUCOSE 171*  --  218*  BUN 31*  --  33*  CREATININE 0.91 1.03* 1.20*  CALCIUM 8.7*  --  8.2*    Liver Function Tests: Recent Labs  Lab 12/24/2019 1620 12/02/19 0316  AST 22 19  ALT 9 8  ALKPHOS 80 71  BILITOT 1.2 1.4*  PROT 6.3* 5.8*  ALBUMIN 1.8* 1.6*    CBG: Recent Labs  Lab 12/20/2019 2229 12/02/19 0627  GLUCAP 158* 212*    Microbiology Studies:   Recent Results (from the past 240 hour(s))  SARS CORONAVIRUS 2 (TAT 6-24 HRS) Nasopharyngeal Nasopharyngeal Swab     Status: None   Collection Time: 12/10/2019  4:21 PM   Specimen: Nasopharyngeal Swab  Result Value Ref Range Status   SARS Coronavirus 2 NEGATIVE NEGATIVE Final    Comment: (NOTE) SARS-CoV-2 target nucleic acids are NOT DETECTED. The SARS-CoV-2 RNA is generally detectable in upper and lower respiratory specimens during the acute phase of infection. Negative results do not preclude SARS-CoV-2 infection, do not rule out co-infections with other pathogens, and should not be used as the sole basis for treatment or other patient management decisions. Negative results must be combined with clinical observations, patient history, and epidemiological information. The expected result is Negative. Fact Sheet for Patients: SugarRoll.be Fact Sheet for Healthcare Providers: https://www.woods-mathews.com/ This test is not yet approved or cleared by the [place] FDA and  has been authorized for detection and/or diagnosis of SARS-CoV-2 by FDA under an Emergency Use Authorization (EUA). This EUA will remain  in effect (meaning this test can be used) for the duration of the COVID-19 declaration under Section 56 4(b)(1) of the Act, 21 U.S.C. section 360bbb-3(b)(1), unless the authorization is terminated or revoked sooner. Performed at [facility], [place] [address], [place], [place] [address]   Blood Culture x 1      Status: None (Preliminary result)   Collection Time: 12/12/2019  5:22 PM   Specimen: BLOOD RIGHT WRIST  Result Value Ref Range Status   Specimen Description BLOOD RIGHT WRIST  Final   Special Requests   Final    BOTTLES DRAWN AEROBIC AND ANAEROBIC Blood Culture results may not be optimal due to an inadequate volume of blood received in culture bottles   Culture   Final    NO GROWTH < 24 HOURS Performed at [facility], [place] [address] [place],  [address]    Report Status PENDING  Incomplete  MRSA PCR Screening     Status: None   Collection Time: 12/08/2019 10:00 PM   Specimen: Nasal Mucosa; Nasopharyngeal  Result Value Ref Range Status   MRSA by PCR NEGATIVE NEGATIVE Final    Comment:        The GeneXpert MRSA Assay (FDA approved for NASAL specimens only), is one component of  a comprehensive MRSA colonization surveillance program. It is not intended to diagnose MRSA infection nor to guide or monitor treatment for MRSA infections. Performed at [facility], [place] [address] [place], [place] [address]      Radiology Studies:  Dg Chest Port 1 View  Result Date: 12/02/2019 CLINICAL DATA:  Acute respiratory distress EXAM: PORTABLE CHEST 1 VIEW COMPARISON:  December 01, 2019 FINDINGS: Developing bilateral pulmonary infiltrates are identified, worsened in the interval. The cardiomediastinal silhouette is stable. No pneumothorax. IMPRESSION: Worsening bilateral pulmonary infiltrates, worrisome for an infectious process. Electronically Signed   By: Dorise Bullion III M.D   On: 12/02/2019 01:01   Xr Chest Single View  Result Date: 12/13/2019 CLINICAL DATA:  68 year old female with tachycardia. Patient refusing fluid. EXAM: PORTABLE CHEST 1 VIEW COMPARISON:  Chest radiograph dated 02/24/2019. FINDINGS: Streaky densities primarily involving the left lower lung field and left lung base may represent atelectasis but developing infiltrate is not excluded. Clinical  correlation is recommended. No lobar consolidation, pleural effusion, pneumothorax. The cardiac silhouette is within normal limits. No acute osseous pathology. IMPRESSION: Possible developing infiltrate at the left lung base. Clinical correlation is recommended. Electronically Signed   By: Anner Crete M.D.   On: 12/23/2019 16:43     Scheduled Meds:   . sodium chloride   Intravenous Once  . baclofen  5 mg Oral TID  . divalproex  250 mg Oral BID  . enoxaparin (LOVENOX) injection  40 mg Subcutaneous Q24H  . escitalopram  10 mg Oral Daily  . insulin aspart  0-6 Units Subcutaneous TID WC  . LORazepam  0.25 mg Oral QHS  . mirtazapine  7.5 mg Oral QHS  . senna  1 tablet Oral BID    Continuous Infusions:   . sodium chloride 75 mL/hr at 12/02/19 1043  . ceFEPime (MAXIPIME) IV 2 g (12/02/19 1043)  . [START ON 12/03/2019] vancomycin       LOS: 1 day     Vernell Leep, MD, [place], [facility]. [facility]    To contact the attending provider between 7A-7P or the covering provider during after hours 7P-7A, please log into the web site [email or web address] and access using universal  password for that web site. If you do not have the password, please call the hospital operator.  12/02/2019, 10:48 AM

## 2019-12-02 NOTE — Progress Notes (Signed)
Addendum  PMT follow-up appreciated.  I communicated with them who met with family and patient has been transitioned to full comfort care.  Subsequently I called and discussed with patient's son/healthcare power of attorney, answered all questions and he was appreciative of the call.  Vernell Leep, MD, [place], [facility]. [facility]  To contact the attending provider between 7A-7P or the covering provider during after hours 7P-7A, please log into the web site [email or web address] and access using universal [place] password for that web site. If you do not have the password, please call the hospital operator.

## 2019-12-02 NOTE — Progress Notes (Signed)
Palliative  Chart reviewed.  Noted events of last night.  Spoke with son Sheila [name] on the phone.  Explained that she was dying when she came into the hospital and was unstable overnight.  Talked with him about a direction - full court press vs comfort care.  Explained that if the goal is full court press and we are successful in treating her current condition she will be in a similar situation at SNF again very soon.  Sheila [name] is discussing the situation with family.  We will touch base again by 1:00 pm today.  He and the family will likely come to the hospital this afternoon for [place].     Patient is a new hospice patient at SNF.  [name], PA-C Palliative Medicine Office:  [phone]  No charge note.

## 2019-12-02 NOTE — Progress Notes (Signed)
NP at bedside. Advises to continue IV antibiotics and monitor vitals.

## 2019-12-02 NOTE — Significant Event (Signed)
Rapid Response Event Note  Overview: Acute hypoxia in HCAP/severe septic shock  Initial Focused Assessment: I was notified by nursing staff of acute desaturation and tachypnea. Pt newly admitted with HCAP and severe septic shock. DNR/DNI. Upon arrival, Ms. Adolf was arousable, nonverbal and in distress. RR 70 with accessory muscle use, sats 85% on 15L NRB. HR 135 ST, 80/67 and temp 101.6 F rectally. BS rhonchi and diminished on L and clear on R. Pt would not cough on command. PCXR repeated showing worsening Bilateral infiltrates. Glena Norfolk RRT was at bedside and administered nebulizer.  Primary svc APP X. Blount notified by staff.  She came to the bedside.  I was able to eventually make the patient cough and wean to [place] HFNC at 6L prior to leaving the bedside to maintain sats >92%. TRH attempted to reach [facility] for [place].   Interventions: -NRB weaned to [place] of Care (if not transferred): -Treat fever and symptom management. -Continue ABX per family request  -Consider comfort care if continues to decline  Event Summary: Call received [phone] Arrived at call 0005 Call ended [phone]  Madelynn Done

## 2019-12-03 DIAGNOSIS — R627 Adult failure to thrive: Secondary | ICD-10-CM

## 2019-12-03 DIAGNOSIS — Z515 Encounter for palliative care: Secondary | ICD-10-CM

## 2019-12-03 MED ORDER — GLYCOPYRROLATE 0.2 MG/ML IJ SOLN
0.4000 mg | INTRAMUSCULAR | Status: AC
  Administered 2019-12-03: 0.4 mg via INTRAVENOUS
  Filled 2019-12-03: qty 2

## 2019-12-03 MED ORDER — MORPHINE BOLUS VIA INFUSION
1.0000 mg | INTRAVENOUS | Status: DC | PRN
  Administered 2019-12-04 (×2): 1 mg via INTRAVENOUS
  Filled 2019-12-03: qty 1

## 2019-12-03 MED ORDER — MORPHINE 100MG IN NS 100ML (1MG/ML) PREMIX INFUSION
5.0000 mg/h | INTRAVENOUS | Status: DC
  Administered 2019-12-03: 0.75 mg/h via INTRAVENOUS
  Administered 2019-12-04: 5 mg/h via INTRAVENOUS
  Filled 2019-12-03 (×3): qty 100

## 2019-12-03 MED ORDER — ACETAMINOPHEN 650 MG RE SUPP
650.0000 mg | Freq: Four times a day (QID) | RECTAL | Status: DC
  Administered 2019-12-03 – 2019-12-04 (×3): 650 mg via RECTAL
  Filled 2019-12-03 (×2): qty 1

## 2019-12-03 MED ORDER — GLYCOPYRROLATE 0.2 MG/ML IJ SOLN
0.4000 mg | Freq: Four times a day (QID) | INTRAMUSCULAR | Status: DC
  Administered 2019-12-03 – 2019-12-04 (×3): 0.4 mg via INTRAVENOUS
  Filled 2019-12-03 (×2): qty 2

## 2019-12-03 MED ORDER — GLYCOPYRROLATE 0.2 MG/ML IJ SOLN: 0.2000 mg | INTRAMUSCULAR | Status: DC | PRN

## 2019-12-03 NOTE — Progress Notes (Addendum)
PROGRESS NOTE   Sheila [name]  [id number]    DOB: [birth year]    DOA: 12/23/2019  PCP: Luetta Nutting, DO   I have briefly reviewed patients previous medical records in [facility].  Chief Complaint:   Chief Complaint  Patient presents with  . Weakness    Brief Narrative:  68 year old female, resident of [facility] SNF for the last 1.5 years, initially used to be able to ambulate with a walker, has been bedbound for several months, PMH of multiple strokes with residual right hemiparesis, dysphagia, dementia, DM, HLD, large sacral decubitus ulcer (POA), recently changed to hospice at SNF over the last 2 to 3 weeks, sent from SNF to [facility] on 12/5 due to poor oral intake, difficulty swallowing, progressive mental status changes and less responsiveness and progressive weakness.  Admitted for dehydration with hypernatremia, acute kidney injury, severe sepsis/septic shock due to suspected aspiration pneumonia, acute respiratory failure with hypoxia and severe progressive failure to thrive.  Overall poor prognosis.  After extensive discussion with family/healthcare power of attorney, PMT consultation, transitioned to full comfort care on 12/6.  Assessment & Plan:  Principal Problem:   Encephalopathy acute Active Problems:   Hyperlipidemia   Type 2 diabetes mellitus with neurologic complication (HCC)   Dysphagia following nontraumatic intracerebral hemorrhage   Multi-infarct dementia without behavioral disturbance (HCC)   Weakness generalized   Hypoxia   Hypernatremia   Leucocytosis   Pressure injury of skin   Acute respiratory distress   Adult failure to thrive   Aspiration pneumonia of both lungs [facility])   Palliative care encounter   Comfort measures only status  Palliative care encounter  As noted above, after extensive discussion by this MD and PMT with son/healthcare power of attorney and other family members, patient was transitioned to full comfort care  on 12/6.  All measures nonessential to comfort i.e. labs, meds etc. were stopped.  Await PMT follow-up.  May consider residential hospice.  Severe sepsis/septic shock, aspiration pneumonia, infected sacral decubitus ulcer  Likely due to aspiration pneumonia from dysphagia due to stroke and acute metabolic encephalopathy and infected sacral decubitus ulcer.  Met sepsis criteria on admission.  COVID-19 testing, MRSA PCR: Negative.  Blood cultures negative to date.  Had been empirically started on aggressive IV fluid hydration and broad-spectrum IV antibiotics which were discontinued after patient was transitioned to full comfort care.  Aspiration pneumonia  Management as noted above.  Large sacral decubitus ulcer, suspect stage III-4, appears somewhat infected  Present on admission.  Management as noted above.  Dehydration with hypernatremia  Secondary to poor oral intake.    Acute metabolic encephalopathy  Secondary to severe sepsis/septic shock, dehydration with hypernatremia and acute kidney injury complicating underlying vascular dementia.  Discontinued all sedative medications i.e. baclofen, divalproex, Lexapro, Ativan and Remeron.    Patient unresponsive but quite comfortable appearing at this time.  Acute respiratory failure with hypoxia  Secondary to aspiration pneumonia.  Currently on high flow nasal cannula oxygen.  Chest x-ray shows worsening bilateral infiltrates.  COVID-19 testing and MRSA PCR negative.  Saturating in the high 90s on high flow nasal cannula oxygen at 3 L/min.  Anion gap metabolic acidosis  Anion gap has gone up from 11-17 overnight.  Lactate on admission was 1.5.  Suspect due to sepsis and acute kidney injury.  Acute kidney injury  Baseline creatinine in the 0.9 range, increased to 1.2.  Suspect due to dehydration and possible ATN from shock and sepsis.  Hypomagnesemia  Noted  Type II DM with peripheral neuropathy  No  treatments at this time  Body mass index is 18.97 kg/m./Severe protein calorie malnutrition  Family looking to transition to full comfort care at this time.  Anemia of chronic disease  Presented with hemoglobin of 7.7 which has dropped to 7 g per DL.  Likely dilutional from IV fluids.  No overt bleeding reported.  Hemoglobin was 13.8 in February.  Severe adult failure to thrive  Multifactorial due to CVAs with residual right hemiplegia, contractures of right upper extremity and possibly both lower extremities, bedbound status, vascular dementia, extremely poor intake, severe malnutrition, large sacral decubitus ulcer, mild left heel ulcer and dysphagia.  Full comfort care  DVT prophylaxis: None due to full comfort care/EOL care. Code Status: DNR.  This was confirmed with patient's son/healthcare power of attorney on 12/7. Family Communication: Discussed in detail with both sons on 12/6.  One of them is the healthcare power of attorney. Disposition: To be determined.  Await PMT follow-up to see if patient is appropriate for residential hospice.   Consultants:   Palliative care medicine  Procedures:   None  Antimicrobials:   IV vancomycin and cefepime-discontinued 12/6   Subjective:  Patient is unresponsive but appears comfortable.  She does not appear in respiratory or painful distress.  As per RN, no acute issues noted.  Objective:   Vitals:   12/02/19 1041 12/02/19 2008 12/02/19 2045 12/02/19 2201  BP: (!) 73/57 (!) 86/55    Pulse: (!) 107 (!) 121    Resp:  (!) 34    Temp:  100.1 F (37.8 C)  98.4 F (36.9 C)  TempSrc:  Axillary  Oral  SpO2:  (!) 86% 95%   Weight:      Height:        General exam: Middle-aged female, looks older than stated age, small built, frail and cachectic, chronically ill looking, lying propped up in bed, and appears quite comfortable at this time Respiratory system: Reduced breath sounds bilaterally.  Although mildly tachypneic, does not  appear to be uncomfortable or in distress. Cardiovascular system: S1 & S2 heard, regular tachycardia. No JVD, murmurs, rubs, gallops or clicks. No pedal edema.  Bedside monitor shows tachycardia in the 110s. Gastrointestinal system: Abdomen is nondistended, soft and nontender. No organomegaly or masses felt. Normal bowel sounds heard. Central nervous system: Unresponsive. Extremities: No spontaneous limb movements noted.  Appears to have contractures of her right upper extremity and bilateral lower extremity which are completely flexed at her knees. Skin: As examined on 12/6 with nursing, approximately 1 cm size stage I superficial ulcer over left heel without acute findings.  Large approximately tennis ball sized deep, stage III-4 mid sacral decubitus ulcer with some dark esharge and some foul odor but no obvious drainage. Psychiatry: Judgement and insight cannot be assessed. Mood & affect cannot be assessed.     Data Reviewed:   I have personally reviewed following labs and imaging studies   CBC: Recent Labs  Lab 11/28/2019 1620 12/26/2019 2331 12/02/19 0316  WBC 13.2* 26.0* 15.0*  HGB 7.7* 7.7* 7.0*  HCT 24.9* 24.2* 22.9*  MCV 94.3 92.7 95.8  PLT 471* 528* 472*    Basic Metabolic Panel: Recent Labs  Lab 12/13/2019 1620 12/02/19 0316  NA 150* 151*  K 4.5 4.7  CL 116* 118*  CO2 23 16*  GLUCOSE 171* 218*  BUN 31* 33*  CREATININE 0.91 1.20*  CALCIUM 8.7* 8.2*    Liver  Function Tests: Recent Labs  Lab 12/27/2019 1620 12/02/19 0316  AST 22 19  ALT 9 8  ALKPHOS 80 71  BILITOT 1.2 1.4*  PROT 6.3* 5.8*  ALBUMIN 1.8* 1.6*    CBG: Recent Labs  Lab 12/12/2019 2229 12/02/19 0627  GLUCAP 158* 212*    Microbiology Studies:   Recent Results (from the past 240 hour(s))  SARS CORONAVIRUS 2 (TAT 6-24 HRS) Nasopharyngeal Nasopharyngeal Swab     Status: None   Collection Time: 12/24/2019  4:21 PM   Specimen: Nasopharyngeal Swab  Result Value Ref Range Status   SARS Coronavirus  2 NEGATIVE NEGATIVE Final    Comment: (NOTE) SARS-CoV-2 target nucleic acids are NOT DETECTED. The SARS-CoV-2 RNA is generally detectable in upper and lower respiratory specimens during the acute phase of infection. Negative results do not preclude SARS-CoV-2 infection, do not rule out co-infections with other pathogens, and should not be used as the sole basis for treatment or other patient management decisions. Negative results must be combined with clinical observations, patient history, and epidemiological information. The expected result is Negative. Fact Sheet for Patients: SugarRoll.be Fact Sheet for Healthcare Providers: https://www.woods-mathews.com/ This test is not yet approved or cleared by the [place] FDA and  has been authorized for detection and/or diagnosis of SARS-CoV-2 by FDA under an Emergency Use Authorization (EUA). This EUA will remain  in effect (meaning this test can be used) for the duration of the COVID-19 declaration under Section 56 4(b)(1) of the Act, 21 U.S.C. section 360bbb-3(b)(1), unless the authorization is terminated or revoked sooner. Performed at [facility], [place] [address], [place], [place] [address]   Blood Culture x 1     Status: None (Preliminary result)   Collection Time: 11/30/2019  5:22 PM   Specimen: BLOOD RIGHT WRIST  Result Value Ref Range Status   Specimen Description BLOOD RIGHT WRIST  Final   Special Requests   Final    BOTTLES DRAWN AEROBIC AND ANAEROBIC Blood Culture results may not be optimal due to an inadequate volume of blood received in culture bottles   Culture   Final    NO GROWTH < 24 HOURS Performed at [facility], [place] [address] [place], [place] [address]    Report Status PENDING  Incomplete  MRSA PCR Screening     Status: None   Collection Time: 12/09/2019 10:00 PM   Specimen: Nasal Mucosa; Nasopharyngeal  Result Value Ref Range Status   MRSA by PCR  NEGATIVE NEGATIVE Final    Comment:        The GeneXpert MRSA Assay (FDA approved for NASAL specimens only), is one component of a comprehensive MRSA colonization surveillance program. It is not intended to diagnose MRSA infection nor to guide or monitor treatment for MRSA infections. Performed at [facility], [place] [address] [place],  [address]      Radiology Studies:  No results found.   Scheduled Meds:   . sodium chloride   Intravenous Once    Continuous Infusions:      LOS: 2 days     Vernell Leep, MD, [place], [facility]. [facility]    To contact the attending provider between 7A-7P or the covering provider during after hours 7P-7A, please log into the web site [email or web address] and access using universal [place] password for that web site. If you do not have the password, please call the hospital operator.  12/03/2019, 9:21 AM

## 2019-12-03 NOTE — Consult Note (Addendum)
[place] Nurse Consult Note: Reason for Consult: Consult requested for sacrum/buttocks wounds.  Reviewed photos in the EMR and progress notes.  Pt now with comfort care goals.   Wound type: Buttocks/sacrum with a chronic stage 4 pressure injury on one side, and an unstageable pressure injury which is shallow and yellow on the other side. Pressure Injury POA: Yes Dressing procedure/placement/frequency: Topical treatment orders provided for bedside nurses to perform Q day as follows: Pack sacrum wound with moist gauze Q day, using swab to fill, then cover with foam dressing.  (Change foam dressing Q 3 days or PRN soiling.) Apply xeroform gauze over shallow buttocks wound Q day, then cover with foam dressing.  (Change foam dressing Q 3 days or PRN soiling. ) Please re-consult if further assistance is needed.  Thank-you,  Julien Girt MSN, [place], [place], [place], [place]

## 2019-12-03 NOTE — Progress Notes (Signed)
Palliative Medicine RN Note: Saw patient, met with family, discussed with Dr Rhea Pink.  Upon my arrival, patient has audible secretions. PAINAD 2. RN had just given Robinul and morphine. Obtained stat order for an additional dose of Robinul, and Dr Hilma Favors adjusted many more orders. Called son to update; he was in the lobby on the way up.   I met with the family at the bedside Roderic Palau, Briscoe Burns). We discussed disease progression, use of morphine for dyspnea at EOL, terminal secretions, initiation of morphine drip, fever/use of PR acetaminophen.  Patient's SCr is 1.2. At this time, she is tolerating morphine well, but if she shows any signs of that changing, our team would consider rotating to a different opioid. However, this is not expected, as she has tolerated 25m in 24 hours.  Our team will follow up tomorrow morning.   MMarjie SkiffHenslee, RN, BSN, [facility] Hospital[facility]alliative [facility] Team 12/03/2019 4:09 PM Office [phone]

## 2019-12-04 MED ORDER — LORAZEPAM 2 MG/ML IJ SOLN
2.0000 mg | INTRAMUSCULAR | Status: DC
  Administered 2019-12-04 – 2019-12-05 (×6): 2 mg via INTRAVENOUS
  Filled 2019-12-04 (×7): qty 1

## 2019-12-04 MED ORDER — GLYCOPYRROLATE 0.2 MG/ML IJ SOLN: 0.4000 mg | INTRAMUSCULAR | Status: DC | PRN

## 2019-12-04 MED ORDER — MORPHINE BOLUS VIA INFUSION
4.0000 mg | INTRAVENOUS | Status: DC | PRN
  Filled 2019-12-04: qty 4

## 2019-12-04 MED ORDER — GLYCOPYRROLATE 0.2 MG/ML IJ SOLN
0.4000 mg | INTRAMUSCULAR | Status: DC
  Administered 2019-12-04 – 2019-12-05 (×7): 0.4 mg via INTRAVENOUS
  Filled 2019-12-04 (×8): qty 2

## 2019-12-04 MED ORDER — FUROSEMIDE 10 MG/ML IJ SOLN
40.0000 mg | Freq: Once | INTRAMUSCULAR | Status: AC
  Administered 2019-12-04: 40 mg via INTRAVENOUS
  Filled 2019-12-04: qty 4

## 2019-12-04 MED ORDER — ACETAMINOPHEN 10 MG/ML IV SOLN
1000.0000 mg | Freq: Four times a day (QID) | INTRAVENOUS | Status: AC
  Administered 2019-12-04 – 2019-12-05 (×4): 1000 mg via INTRAVENOUS
  Filled 2019-12-04 (×6): qty 100

## 2019-12-04 NOTE — Progress Notes (Signed)
Chaplain provided spiritual care to family through prayer.  Family expressed their gratitude for a chaplain.  Chaplain will follow-up as needed.

## 2019-12-04 NOTE — Care Management Important Message (Signed)
Important Message  Patient Details  Name: Sheila [name] MRN: [id number] Date of Birth: 03-Nov-1951   Medicare Important Message Given:  Yes     Orbie Pyo 12/04/2019, 2:02 PM

## 2019-12-04 NOTE — Progress Notes (Signed)
Received call from pt.'s son Roderic Palau regarding visitation limits on comfort care. Discussed with DD and AC. Pt. Can have 4 visitors today but only the same 4 visitors. May have different 4 visitors tomorrow.    @ [place] back and notified that visitors must be 18 or older to visit.

## 2019-12-04 NOTE — Care Management Important Message (Signed)
Important Message  Patient Details  Name: Sheila [name] MRN: [id number] Date of Birth: 17-Oct-1951   Medicare Important Message Given:  Yes     Orbie Pyo 12/04/2019, 2:01 PM

## 2019-12-04 NOTE — Progress Notes (Signed)
Palliative Medicine RN Note: Afternoon symptom check.  Patient looks completely different from this morning. She is relaxed, with even and unlabored respirations. Terminal secretions are dry, and her breath sounds are no longer audible from the door. Her body is much cooler, and her bed linens are not soaked with sweat.  I called her son Roderic Palau to provide an update. Plan for Dr Hilma Favors to see Sheila [name] later this afternoon/evening, and she will update him as well.  Marjie Skiff Davine Sweney, RN, BSN, [facility] Palliative Medicine Team 12/04/2019 3:38 PM Office [phone]

## 2019-12-04 NOTE — Progress Notes (Signed)
Pt HR in 190s, 1mg  morphine bolus admin. Dr. Baltazar Najjar informed.

## 2019-12-04 NOTE — Progress Notes (Signed)
PROGRESS NOTE   Sheila [name]  YYT:[id number]    DOB: [birth year]    DOA: 11/27/2019  PCP: Luetta Nutting, DO   I have briefly reviewed patients previous medical records in [facility].  Chief Complaint:   Chief Complaint  Patient presents with  . Weakness    Brief Narrative:  68 year old female, resident of [facility] for the last 1.5 years, initially used to be able to ambulate with a walker, has been bedbound for several months, PMH of multiple strokes with residual right hemiparesis, dysphagia, dementia, DM, HLD, large sacral decubitus ulcer (POA), recently changed to hospice at SNF over the last 2 to 3 weeks, sent from SNF to [facility] on 12/5 due to poor oral intake, difficulty swallowing, progressive mental status changes and less responsiveness and progressive weakness.  Admitted for dehydration with hypernatremia, acute kidney injury, severe sepsis/septic shock due to suspected aspiration pneumonia, acute respiratory failure with hypoxia and severe progressive failure to thrive.  Overall poor prognosis.  After extensive discussion with family/healthcare power of attorney, PMT consultation, transitioned to full comfort care on 12/6.  PMT continues to follow and assist with medication management to achieve full comfort.  Assessment & Plan:  Principal Problem:   Encephalopathy acute Active Problems:   Hyperlipidemia   Type 2 diabetes mellitus with neurologic complication (HCC)   Dysphagia following nontraumatic intracerebral hemorrhage   Multi-infarct dementia without behavioral disturbance (HCC)   Weakness generalized   Hypoxia   Hypernatremia   Leucocytosis   Pressure injury of skin   Acute respiratory distress   Adult failure to thrive   Aspiration pneumonia of both lungs [facility])   Palliative care encounter   Comfort measures only status  Palliative care encounter  As noted above, after extensive discussion by this MD and PMT with son/healthcare  power of attorney and other family members, patient was transitioned to full comfort care on 12/6.  All measures nonessential to comfort i.e. labs, meds etc. were stopped.  PMT follow-up appreciated.  I discussed with PMT.  Overnight events noted, spiked high fever and tachycardia.  Patient now started on IV morphine drip and IV Tylenol for fever and Ativan for anxiety.  On my visit midmorning, patient appeared to be comfortable in no obvious distress.  Severe sepsis/septic shock, aspiration pneumonia, infected sacral decubitus ulcer  Likely due to aspiration pneumonia from dysphagia due to stroke and acute metabolic encephalopathy and infected sacral decubitus ulcer.  Met sepsis criteria on admission.  COVID-19 testing, MRSA PCR: Negative.  Blood cultures negative to date.  Had been empirically started on aggressive IV fluid hydration and broad-spectrum IV antibiotics which were discontinued after patient was transitioned to full comfort care.  Had high fevers last night, could be related to untreated sepsis from aspiration pneumonia or infected decubitus ulcer.  IV Tylenol.  Aspiration pneumonia  Management as noted above.  Large sacral decubitus ulcer, suspect stage III-4, appears somewhat infected  Present on admission.  Management as noted above.  WOC RN input from 12/7 appreciated.  Dehydration with hypernatremia  Secondary to poor oral intake.    Acute metabolic encephalopathy  Secondary to severe sepsis/septic shock, dehydration with hypernatremia and acute kidney injury complicating underlying vascular dementia.  Discontinued all sedative medications i.e. baclofen, divalproex, Lexapro, Ativan and Remeron.    Patient was again unresponsive when seen midmorning today.  Acute respiratory failure with hypoxia  Secondary to aspiration pneumonia.  Currently on high flow nasal cannula oxygen.  Chest  x-ray shows worsening bilateral infiltrates.  COVID-19 testing and MRSA  PCR negative.  Saturating in the high 90s on high flow nasal cannula oxygen at 3 L/min.  Anion gap metabolic acidosis  Anion gap has gone up from 11-17 overnight.  Lactate on admission was 1.5.  Suspect due to sepsis and acute kidney injury.  Acute kidney injury  Baseline creatinine in the 0.9 range, increased to 1.2.  Suspect due to dehydration and possible ATN from shock and sepsis.  Hypomagnesemia  Noted  Type II DM with peripheral neuropathy  No treatments at this time  Body mass index is 18.97 kg/m./Severe protein calorie malnutrition  Family looking to transition to full comfort care at this time.  Anemia of chronic disease  Presented with hemoglobin of 7.7 which has dropped to 7 g per DL.  Likely dilutional from IV fluids.  No overt bleeding reported.  Hemoglobin was 13.8 in February.  Severe adult failure to thrive  Multifactorial due to CVAs with residual right hemiplegia, contractures of right upper extremity and possibly both lower extremities, bedbound status, vascular dementia, extremely poor intake, severe malnutrition, large sacral decubitus ulcer, mild left heel ulcer and dysphagia.  Full comfort care  DVT prophylaxis: None due to full comfort care/EOL care. Code Status: DNR.  This was confirmed with patient's son/healthcare power of attorney on 12/7. Family Communication: I discussed in detail with patient's son/healthcare power of attorney on 12/7, updated care and answered questions.  No family at bedside this morning. Disposition: To be determined.  Await PMT follow-up to see if patient is appropriate for residential hospice.   Consultants:   Palliative care medicine  Procedures:   None  Antimicrobials:   IV vancomycin and cefepime-discontinued 12/6   Subjective:  Overnight events noted.  Patient examined with RN in room.  Unresponsive.  Appears comfortable and in no obvious distress.  Objective:   Vitals:   12/04/19 0001 12/04/19 0541  12/04/19 0614 12/04/19 1124  BP:    (!) 79/58  Pulse: (!) 136 (!) 194 (!) 198 (!) 126  Resp:    20  Temp:    (!) 102.1 F (38.9 C)  TempSrc:    Axillary  SpO2: 93%   93%  Weight:      Height:        General exam: Middle-aged female, looks older than stated age, small built, frail and cachectic, chronically ill looking, lying propped up in bed, and appears quite comfortable at this time.  Patient did not appear in any distress. Respiratory system: Decreased breath sounds bilaterally.  No increased work of breathing. Cardiovascular system: Mild tachycardia noted on bedside monitor with heart rate in the 110s. Gastrointestinal system: Not examined. Central nervous system: Unresponsive. Extremities: No spontaneous limb movements noted.  Appears to have contractures of her right upper extremity and bilateral lower extremity which are completely flexed at her knees. Skin: As examined on 12/6 with nursing, approximately 1 cm size stage I superficial ulcer over left heel without acute findings.  Large approximately tennis ball sized deep, stage III-4 mid sacral decubitus ulcer with some dark esharge and some foul odor but no obvious drainage. Psychiatry: Judgement and insight cannot be assessed. Mood & affect cannot be assessed.     Data Reviewed:   I have personally reviewed following labs and imaging studies   CBC: Recent Labs  Lab 12/27/2019 1620 12/18/2019 2331 12/02/19 0316  WBC 13.2* 26.0* 15.0*  HGB 7.7* 7.7* 7.0*  HCT 24.9* 24.2* 22.9*  MCV  94.3 92.7 95.8  PLT 471* 528* 472*    Basic Metabolic Panel: Recent Labs  Lab 12/10/2019 1620 12/02/19 0316  NA 150* 151*  K 4.5 4.7  CL 116* 118*  CO2 23 16*  GLUCOSE 171* 218*  BUN 31* 33*  CREATININE 0.91 1.20*  CALCIUM 8.7* 8.2*    Liver Function Tests: Recent Labs  Lab 12/27/2019 1620 12/02/19 0316  AST 22 19  ALT 9 8  ALKPHOS 80 71  BILITOT 1.2 1.4*  PROT 6.3* 5.8*  ALBUMIN 1.8* 1.6*    CBG: Recent Labs  Lab  11/28/2019 2229 12/02/19 0627  GLUCAP 158* 212*    Microbiology Studies:   Recent Results (from the past 240 hour(s))  SARS CORONAVIRUS 2 (TAT 6-24 HRS) Nasopharyngeal Nasopharyngeal Swab     Status: None   Collection Time: 11/30/2019  4:21 PM   Specimen: Nasopharyngeal Swab  Result Value Ref Range Status   SARS Coronavirus 2 NEGATIVE NEGATIVE Final    Comment: (NOTE) SARS-CoV-2 target nucleic acids are NOT DETECTED. The SARS-CoV-2 RNA is generally detectable in upper and lower respiratory specimens during the acute phase of infection. Negative results do not preclude SARS-CoV-2 infection, do not rule out co-infections with other pathogens, and should not be used as the sole basis for treatment or other patient management decisions. Negative results must be combined with clinical observations, patient history, and epidemiological information. The expected result is Negative. Fact Sheet for Patients: SugarRoll.be Fact Sheet for Healthcare Providers: https://www.woods-mathews.com/ This test is not yet approved or cleared by the [place] FDA and  has been authorized for detection and/or diagnosis of SARS-CoV-2 by FDA under an Emergency Use Authorization (EUA). This EUA will remain  in effect (meaning this test can be used) for the duration of the COVID-19 declaration under Section 56 4(b)(1) of the Act, 21 U.S.C. section 360bbb-3(b)(1), unless the authorization is terminated or revoked sooner. Performed at [facility], [place] [address], [place], [place] [address]   Blood Culture x 1     Status: None (Preliminary result)   Collection Time: 12/10/2019  5:22 PM   Specimen: BLOOD RIGHT WRIST  Result Value Ref Range Status   Specimen Description BLOOD RIGHT WRIST  Final   Special Requests   Final    BOTTLES DRAWN AEROBIC AND ANAEROBIC Blood Culture results may not be optimal due to an inadequate volume of blood received in culture  bottles   Culture   Final    NO GROWTH 3 DAYS Performed at [facility], [place] [address] [place], [place] [address]    Report Status PENDING  Incomplete  MRSA PCR Screening     Status: None   Collection Time: 12/11/2019 10:00 PM   Specimen: Nasal Mucosa; Nasopharyngeal  Result Value Ref Range Status   MRSA by PCR NEGATIVE NEGATIVE Final    Comment:        The GeneXpert MRSA Assay (FDA approved for NASAL specimens only), is one component of a comprehensive MRSA colonization surveillance program. It is not intended to diagnose MRSA infection nor to guide or monitor treatment for MRSA infections. Performed at [facility], [place] [address], [place], [place] [address]      Radiology Studies:  No results found.   Scheduled Meds:   . sodium chloride   Intravenous Once  . glycopyrrolate  0.4 mg Intravenous Q4H  . LORazepam  2 mg Intravenous Q4H    Continuous Infusions:   . acetaminophen 1,000 mg (  12/04/19 0934)  . morphine 5 mg/hr (12/04/19 0855)     LOS: 3 days     Vernell Leep, MD, [place], [facility]. [facility]    To contact the attending provider between 7A-7P or the covering provider during after hours 7P-7A, please log into the web site [email or web address] and access using universal  password for that web site. If you do not have the password, please call the hospital operator.  12/04/2019, 3:12 PM

## 2019-12-04 NOTE — Plan of Care (Signed)

## 2019-12-04 NOTE — Progress Notes (Signed)
Pt's son (POA) Roderic Palau called with updates at [phone]. This update consisted of the pt's current HR of 204 at time of update, MD order to give Morphine bolus and to increase morphine to 2 mg if bolus x2 (see order), and the pt's O2 status. Pt's son informed of being able to come to see pt at earliest convenience if desired based on pt's prognosis. Pt's son verbalized a clear understanding.

## 2019-12-04 NOTE — Progress Notes (Signed)
Palliative Medicine RN Note: AM symptom check    Note that HR has been around 200. Pt is hot, BP elevated o/n. Febrile despite PR acetaminophen. Morphine gtt was increased this morning per parameters ordered by Dr Hilma Favors yesterday. Breath sounds audible from the door, and terminal secretions are markedly increased from yesterday afternoon.  Called Dr Hilma Favors. She ordered an increase in morphine infusion, one-time lasix IV, increase Robinul, change acetaminophen to IV, add lorazepam (patient had taken benzos in the past routinely, but it's unclear if facility had been giving it to her per son). Updated RN  Called son Roderic Palau and expressed concerns about discomfort AEB fever, HR, BP o/n, wet lungs, increased secretions, and explained the new orders, as well as the plans for follow up this afternoon. He expressed understanding.  Marjie Skiff Vedder Brittian, RN, BSN, [facility] Palliative Medicine Team 12/04/2019 3:45 PM Office [phone]

## 2019-12-04 NOTE — Progress Notes (Signed)
Palliative Care Progress Note  Sheila [name] is resting comfortably in the bed this evening after several medication adjustments and titrations today. This morning she was febrile to 103, dyspneic, tachycardic and had worsening chest congestion and Rhonchi. She has responded well to morphine infusion, scheduled ativan, increased dose of robinul an had a good response to the lasix. I also started her IV Tylenol to achieve reduction in her fever and for her comfort vs. PR administration.  I discussed her care plan with her son Sheila [name] who expressed gratitude for the entire care team and appreciated the communication. I explained that her prognosis is most likely hours-days to certainly less than 2 weeks and that as long as she demonstrated stability for transfer a hospice facility would be appropriate for her EOL care. He will discuss this with his siblings and let us know tomorrow about hospice options.  Maintain all current interventions.  Our team will follow closely and assist with the disposition plan.  Sheila Hacker, DO Palliative Medicine [phone]  Time; 35 min Greater than 50%  of this time was spent counseling and coordinating care related to the above assessment and plan.

## 2019-12-05 LAB — BPAM RBC
Blood Product Expiration Date: 202012302359
Blood Product Expiration Date: 202101022359
ISSUE DATE / TIME: 202012012244
Unit Type and Rh: 5100
Unit Type and Rh: 5100

## 2019-12-05 LAB — TYPE AND SCREEN
ABO/RH(D): O POS
Antibody Screen: NEGATIVE
Unit division: 0
Unit division: 0

## 2019-12-05 MED ORDER — ACETAMINOPHEN 10 MG/ML IV SOLN
1000.0000 mg | Freq: Four times a day (QID) | INTRAVENOUS | Status: DC
  Administered 2019-12-05: 1000 mg via INTRAVENOUS
  Filled 2019-12-05 (×4): qty 100

## 2019-12-06 LAB — CULTURE, BLOOD (SINGLE): Culture: NO GROWTH

## 2019-12-28 NOTE — Progress Notes (Signed)
RN called pt son Roderic Palau for follow up on pt disposition and he said family has still not picked a funeral home yet and working on and will like pt body to be sent down to the morgue until family decide but; family will come see pt before sending pt down to the morgue. Pt placement inquired from RN and staff updated. Delia Heady RN

## 2019-12-28 NOTE — Consult Note (Signed)
Responded to page from nurse that family had just arrived for pt, who passed this afternoon. Provided spiritual/ grief support and prayer, which they appreciated. Family is [place].   Rev. Eloise Levels Chaplain

## 2019-12-28 NOTE — Progress Notes (Signed)
Palliative Medicine RN Note: Note that pt's acetaminophen order has expired, as it is only for 24 hours. Dr Hilma Favors reordered, as pt's fever has been under control since starting it IV.  Marjie Skiff Luverta Korte, RN, BSN, [facility] Palliative Medicine Team 12-13-19 9:42 AM Office [phone]

## 2019-12-28 NOTE — Progress Notes (Signed)
PROGRESS NOTE    Sheila [name]   [id number]  DOB: May 31, 1951  DOA: 11/29/2019 PCP: Luetta Nutting, DO   Brief Narrative:  Sheila [name] 69 year old female, resident of [facility] SNF for the last 1.5 years, initially used to be able to ambulate with a walker, has been bedbound for several months, PMH of multiple strokes with residual right hemiparesis, dysphagia, dementia, DM, HLD, large sacral decubitus ulcer (POA), recently changed to hospice at SNF over the last 2 to 3 weeks, sent from SNF to [facility] on 12/5 due to poor oral intake, difficulty swallowing, progressive mental status changes and less responsiveness and progressive weakness.  Admitted for dehydration with hypernatremia, acute kidney injury, severe sepsis/septic shock due to suspected aspiration pneumonia, acute respiratory failure with hypoxia and severe progressive failure to thrive.  Overall poor prognosis.  After extensive discussion with family/healthcare power of attorney, PMT consultation, transitioned to full comfort care on 12/6.    Subjective: Appears comfortable- asleep.    Assessment & Plan:   Principal Problem:   Encephalopathy acute Active Problems:   Hyperlipidemia   Type 2 diabetes mellitus with neurologic complication (HCC)   Dysphagia following nontraumatic intracerebral hemorrhage   Multi-infarct dementia without behavioral disturbance (HCC)   Weakness generalized   Hypoxia   Hypernatremia   Leucocytosis   Pressure injury of skin   Acute respiratory distress   Adult failure to thrive   Aspiration pneumonia of both lungs (HCC)   Palliative care encounter   Comfort measures only status   I have reviewed the patient's chart and states that she has had a decline during this hospital stay attributed to septic shock and acute respiratory failure from aspiration pneumonia and an infected sacral decubitus ulcer.  She has been transitioned to comfort care. Appreciate palliative  team assistance who is currently working with family to determine the patient's disposition.  Time spent in minutes: 25 DVT prophylaxis: Discontinued Code Status: DNR Family Communication: Son is the patient's healthcare power of attorney Disposition Plan: To be determined Consultants:   Palliative care Procedures:   None Antimicrobials:  Anti-infectives (From admission, onward)   Start     Dose/Rate Route Frequency Ordered Stop   12/03/19 0600  vancomycin (VANCOCIN) IVPB 750 mg/150 ml premix  Status:  Discontinued     750 mg 150 mL/hr over 60 Minutes Intravenous Every 24 hours 12/02/19 0118 12/02/19 1200   12/02/19 0130  vancomycin (VANCOCIN) IVPB 1000 mg/200 mL premix     1,000 mg 200 mL/hr over 60 Minutes Intravenous  Once 12/02/19 0112 12/02/19 0326   12/13/2019 2330  ceFEPIme (MAXIPIME) 2 g in sodium chloride 0.9 % 100 mL IVPB  Status:  Discontinued     2 g 200 mL/hr over 30 Minutes Intravenous Every 12 hours 12/26/2019 2256 12/02/19 1200       Objective: Vitals:   12/04/19 1534 12/04/19 1957 2019-12-12 0700 12-12-19 0854  BP:  (!) 67/34  (!) 59/38  Pulse:  (!) 110 (!) (P) 118 (!) 116  Resp:  20  20  Temp: 99.9 F (37.7 C) 98.3 F (36.8 C)  100 F (37.8 C)  TempSrc: Axillary Axillary  Oral  SpO2:  97% (P) 100%   Weight:      Height:        Intake/Output Summary (Last 24 hours) at 12-Dec-2019 1058 Last data filed at 2019-12-12 0322 Gross per 24 hour  Intake 300.6 ml  Output -  Net 300.6 ml  Filed Weights   12/02/19 0105  Weight: 51.7 kg    Examination: General exam: Appears comfortable  Respiratory system: Clear to auscultation. Respiratory effort normal. Cardiovascular system: S1 & S2 heard, RRR.   Gastrointestinal system: Abdomen soft, non-tender, nondistended. Normal bowel sounds. Extremities: No cyanosis, clubbing or edema    Data Reviewed: I have personally reviewed following labs and imaging studies  CBC: Recent Labs  Lab 12/02/2019 1620 11/27/2019  2331 12/02/19 0316  WBC 13.2* 26.0* 15.0*  HGB 7.7* 7.7* 7.0*  HCT 24.9* 24.2* 22.9*  MCV 94.3 92.7 95.8  PLT 471* 528* [id number]*   Basic Metabolic Panel: Recent Labs  Lab 12/20/2019 1620 12/02/19 0316  NA 150* 151*  K 4.5 4.7  CL 116* 118*  CO2 23 16*  GLUCOSE 171* 218*  BUN 31* 33*  CREATININE 0.91 1.20*  CALCIUM 8.7* 8.2*   GFR: Estimated Creatinine Clearance: 36.1 mL/min (A) (by C-G formula based on SCr of 1.2 mg/dL (H)). Liver Function Tests: Recent Labs  Lab 12/14/2019 1620 12/02/19 0316  AST 22 19  ALT 9 8  ALKPHOS 80 71  BILITOT 1.2 1.4*  PROT 6.3* 5.8*  ALBUMIN 1.8* 1.6*   No results for input(s): LIPASE, AMYLASE in the last 168 hours. No results for input(s): AMMONIA in the last 168 hours. Coagulation Profile: No results for input(s): INR, PROTIME in the last 168 hours. Cardiac Enzymes: No results for input(s): CKTOTAL, CKMB, CKMBINDEX, TROPONINI in the last 168 hours. BNP (last 3 results) No results for input(s): PROBNP in the last 8760 hours. HbA1C: No results for input(s): HGBA1C in the last 72 hours. CBG: Recent Labs  Lab 12/13/2019 2229 12/02/19 0627  GLUCAP 158* 212*   Lipid Profile: No results for input(s): CHOL, HDL, LDLCALC, TRIG, CHOLHDL, LDLDIRECT in the last 72 hours. Thyroid Function Tests: No results for input(s): TSH, T4TOTAL, FREET4, T3FREE, THYROIDAB in the last 72 hours. Anemia Panel: No results for input(s): VITAMINB12, FOLATE, FERRITIN, TIBC, IRON, RETICCTPCT in the last 72 hours. Urine analysis:    Component Value Date/Time   COLORURINE YELLOW 05/29/2018 0706   APPEARANCEUR HAZY (A) 05/29/2018 0706   LABSPEC 1.011 05/29/2018 0706   PHURINE 5.0 05/29/2018 0706   GLUCOSEU NEGATIVE 05/29/2018 0706   HGBUR NEGATIVE 05/29/2018 0706   BILIRUBINUR NEGATIVE 05/29/2018 0706   BILIRUBINUR Neg 01/22/2014 1656   KETONESUR 5 (A) 05/29/2018 0706   PROTEINUR NEGATIVE 05/29/2018 0706   UROBILINOGEN 0.2 01/22/2014 1656   NITRITE NEGATIVE  05/29/2018 0706   LEUKOCYTESUR NEGATIVE 05/29/2018 0706   Sepsis Labs: @LABRCNTIP (procalcitonin:4,lacticidven:4) ) Recent Results (from the past 240 hour(s))  SARS CORONAVIRUS 2 (TAT 6-24 HRS) Nasopharyngeal Nasopharyngeal Swab     Status: None   Collection Time: 12/08/2019  4:21 PM   Specimen: Nasopharyngeal Swab  Result Value Ref Range Status   SARS Coronavirus 2 NEGATIVE NEGATIVE Final    Comment: (NOTE) SARS-CoV-2 target nucleic acids are NOT DETECTED. The SARS-CoV-2 RNA is generally detectable in upper and lower respiratory specimens during the acute phase of infection. Negative results do not preclude SARS-CoV-2 infection, do not rule out co-infections with other pathogens, and should not be used as the sole basis for treatment or other patient management decisions. Negative results must be combined with clinical observations, patient history, and epidemiological information. The expected result is Negative. Fact Sheet for Patients: SugarRoll.be Fact Sheet for Healthcare Providers: https://www.woods-mathews.com/ This test is not yet approved or cleared by the [place] FDA and  has been authorized for detection and/or  diagnosis of SARS-CoV-2 by FDA under an Emergency Use Authorization (EUA). This EUA will remain  in effect (meaning this test can be used) for the duration of the COVID-19 declaration under Section 56 4(b)(1) of the Act, 21 U.S.C. section 360bbb-3(b)(1), unless the authorization is terminated or revoked sooner. Performed at [facility], [place] [address], [place], [place] [address]   Blood Culture x 1     Status: None (Preliminary result)   Collection Time: 12/09/2019  5:22 PM   Specimen: BLOOD RIGHT WRIST  Result Value Ref Range Status   Specimen Description BLOOD RIGHT WRIST  Final   Special Requests   Final    BOTTLES DRAWN AEROBIC AND ANAEROBIC Blood Culture results may not be optimal due to an inadequate  volume of blood received in culture bottles   Culture   Final    NO GROWTH 3 DAYS Performed at [facility], [place] [address] [place], [place] [address]    Report Status PENDING  Incomplete  MRSA PCR Screening     Status: None   Collection Time: 12/13/2019 10:00 PM   Specimen: Nasal Mucosa; Nasopharyngeal  Result Value Ref Range Status   MRSA by PCR NEGATIVE NEGATIVE Final    Comment:        The GeneXpert MRSA Assay (FDA approved for NASAL specimens only), is one component of a comprehensive MRSA colonization surveillance program. It is not intended to diagnose MRSA infection nor to guide or monitor treatment for MRSA infections. Performed at [facility], [place] [address], [place], [place] [address]          Radiology Studies: No results found.    Scheduled Meds: . sodium chloride   Intravenous Once  . glycopyrrolate  0.4 mg Intravenous Q4H  . LORazepam  2 mg Intravenous Q4H   Continuous Infusions: . acetaminophen    . morphine 5 mg/hr (12/04/19 2326)     LOS: 4 days      Debbe Odea, MD [facility] Pager: [email or web address] Password [email or web address] January 02, 2020, 10:58 AM

## 2019-12-28 NOTE — Progress Notes (Cosign Needed Addendum)
15 ml Morphine wasted with Casandra Doffing, RN.

## 2019-12-28 NOTE — Death Summary Note (Signed)
DEATH SUMMARY   Patient Details  Name: Sheila [name] MRN: [id number] DOB: 1951-06-03  Admission/Discharge Information   Admit Date:  2019-12-31  Date of Death: Date of Death: 04-Jan-2020  Time of Death: Time of Death: 04-19-42  Length of Stay: 4  Referring Physician: Luetta Nutting, DO   Reason(s) for Hospitalization  Altered Mental status  Diagnoses  Preliminary cause of death:  Secondary Diagnoses (including complications and co-morbidities):  Principal Problem:   Encephalopathy acute Active Problems:   Dysphagia following nontraumatic intracerebral hemorrhage   Acute respiratory distress   Aspiration pneumonia of both lungs (HCC)   Hyperlipidemia   Type 2 diabetes mellitus with neurologic complication ([place])   Multi-infarct dementia without behavioral disturbance (HCC)   Weakness generalized   Hypernatremia   Leucocytosis   Pressure injury of skin   Adult failure to thrive   Palliative care encounter   Comfort measures only status   Brief Hospital Course (including significant findings, care, treatment, and services provided and events leading to death)  Sheila [name] is a 69 y.o. year old female who was a resident of [facility] for the last 1.5 years, bed bound, PMH of multiple strokes with residual right hemiparesis, dysphagia, dementia, DM, HLD, large sacral decubitus ulcer (POA), recently changed to hospice at SNF over the last 2 to 3 weeks, sent from SNF to [facility] on December 31, 2023 due to poor oral intake, difficulty swallowing, progressive mental status changes and less responsiveness and progressive weakness. Admitted for dehydration with hypernatremia, acute kidney injury, severe sepsis/septic shock due to suspected aspiration pneumonia, acute respiratory failure with hypoxia and severe progressive failure to thrive. Overall poor prognosis. After extensive discussion with family/healthcare power of attorney, PMT consultation, transitioned to full comfort  care on 12/6.  She expired on 01-04-2024 at approximately 1343.    Pertinent Labs and Studies  Significant Diagnostic Studies Dg Chest Port 1 View  Result Date: 12/02/2019 CLINICAL DATA:  Acute respiratory distress EXAM: PORTABLE CHEST 1 VIEW COMPARISON:  31-Dec-2019 FINDINGS: Developing bilateral pulmonary infiltrates are identified, worsened in the interval. The cardiomediastinal silhouette is stable. No pneumothorax. IMPRESSION: Worsening bilateral pulmonary infiltrates, worrisome for an infectious process. Electronically Signed   By: Dorise Bullion III M.D   On: 12/02/2019 01:01   Xr Chest Single View  Result Date: 12-31-2019 CLINICAL DATA:  69 year old female with tachycardia. Patient refusing fluid. EXAM: PORTABLE CHEST 1 VIEW COMPARISON:  Chest radiograph dated 02/24/2019. FINDINGS: Streaky densities primarily involving the left lower lung field and left lung base may represent atelectasis but developing infiltrate is not excluded. Clinical correlation is recommended. No lobar consolidation, pleural effusion, pneumothorax. The cardiac silhouette is within normal limits. No acute osseous pathology. IMPRESSION: Possible developing infiltrate at the left lung base. Clinical correlation is recommended. Electronically Signed   By: Anner Crete M.D.   On: 12-31-2019 16:43    Microbiology Recent Results (from the past 240 hour(s))  SARS CORONAVIRUS 2 (TAT 6-24 HRS) Nasopharyngeal Nasopharyngeal Swab     Status: None   Collection Time: 2019-12-31  4:21 PM   Specimen: Nasopharyngeal Swab  Result Value Ref Range Status   SARS Coronavirus 2 NEGATIVE NEGATIVE Final    Comment: (NOTE) SARS-CoV-2 target nucleic acids are NOT DETECTED. The SARS-CoV-2 RNA is generally detectable in upper and lower respiratory specimens during the acute phase of infection. Negative results do not preclude SARS-CoV-2 infection, do not rule out co-infections with other pathogens, and should not be used  as  the sole basis for treatment or other patient management decisions. Negative results must be combined with clinical observations, patient history, and epidemiological information. The expected result is Negative. Fact Sheet for Patients: SugarRoll.be Fact Sheet for Healthcare Providers: https://www.woods-mathews.com/ This test is not yet approved or cleared by the [place] FDA and  has been authorized for detection and/or diagnosis of SARS-CoV-2 by FDA under an Emergency Use Authorization (EUA). This EUA will remain  in effect (meaning this test can be used) for the duration of the COVID-19 declaration under Section 56 4(b)(1) of the Act, 21 U.S.C. section 360bbb-3(b)(1), unless the authorization is terminated or revoked sooner. Performed at [facility], [place] [address], [place], [place] [address]   Blood Culture x 1     Status: None (Preliminary result)   Collection Time: 12/04/2019  5:22 PM   Specimen: BLOOD RIGHT WRIST  Result Value Ref Range Status   Specimen Description BLOOD RIGHT WRIST  Final   Special Requests   Final    BOTTLES DRAWN AEROBIC AND ANAEROBIC Blood Culture results may not be optimal due to an inadequate volume of blood received in culture bottles   Culture   Final    NO GROWTH 4 DAYS Performed at [facility], [place] [address] [place], [place] [address]    Report Status PENDING  Incomplete  MRSA PCR Screening     Status: None   Collection Time: 12/02/2019 10:00 PM   Specimen: Nasal Mucosa; Nasopharyngeal  Result Value Ref Range Status   MRSA by PCR NEGATIVE NEGATIVE Final    Comment:        The GeneXpert MRSA Assay (FDA approved for NASAL specimens only), is one component of a comprehensive MRSA colonization surveillance program. It is not intended to diagnose MRSA infection nor to guide or monitor treatment for MRSA infections. Performed at [facility], [place] [address], [place], [place]  [address]     Lab Basic Metabolic Panel: Recent Labs  Lab 12/07/2019 1620 12/02/19 0316  NA 150* 151*  K 4.5 4.7  CL 116* 118*  CO2 23 16*  GLUCOSE 171* 218*  BUN 31* 33*  CREATININE 0.91 1.20*  CALCIUM 8.7* 8.2*   Liver Function Tests: Recent Labs  Lab 11/28/2019 1620 12/02/19 0316  AST 22 19  ALT 9 8  ALKPHOS 80 71  BILITOT 1.2 1.4*  PROT 6.3* 5.8*  ALBUMIN 1.8* 1.6*   No results for input(s): LIPASE, AMYLASE in the last 168 hours. No results for input(s): AMMONIA in the last 168 hours. CBC: Recent Labs  Lab 12/20/2019 1620 12/19/2019 2331 12/02/19 0316  WBC 13.2* 26.0* 15.0*  HGB 7.7* 7.7* 7.0*  HCT 24.9* 24.2* 22.9*  MCV 94.3 92.7 95.8  PLT 471* 528* 472*   Cardiac Enzymes: No results for input(s): CKTOTAL, CKMB, CKMBINDEX, TROPONINI in the last 168 hours. Sepsis Labs: Recent Labs  Lab 12/19/2019 1620 11/30/2019 2331 12/02/19 0316  WBC 13.2* 26.0* 15.0*  LATICACIDVEN 1.5  --   --     Procedures/Operations      Khalif Stender December 13, 2019, 3:30 PM

## 2019-12-28 NOTE — Progress Notes (Addendum)
[facility] assessed pt not to be breathing and listed for heart beat per protocol and none heard; Charge RN Anderson Malta Rimmer second confirmed per protocol. Pt pronounced at 1343 and Dr. Wynelle Cleveland was notified; Dr. Hilma Favors from palliative notified via [place]. RN Gaspar Bidding called pt's son to notify; pt son said he need time to process and will contact RN later. [facility] notified per protocol and pt not suitable for donation. Referral number [id number] per [facility] staff April Shore. Pt IV's removed; pt cleaned and in bed with belongings in the room. Awaiting on family to confirm disposition. Delia Heady RN

## 2019-12-28 DEATH — deceased
